# Patient Record
Sex: Male | Born: 1950
Health system: Southern US, Community
[De-identification: ages and names within clinical notes are randomized; demographics above are authoritative.]

## PROBLEM LIST (undated history)

## (undated) DIAGNOSIS — J45909 Unspecified asthma, uncomplicated: Secondary | ICD-10-CM

## (undated) DIAGNOSIS — N200 Calculus of kidney: Secondary | ICD-10-CM

## (undated) DIAGNOSIS — E538 Deficiency of other specified B group vitamins: Secondary | ICD-10-CM

## (undated) DIAGNOSIS — I10 Essential (primary) hypertension: Secondary | ICD-10-CM

## (undated) DIAGNOSIS — M79606 Pain in leg, unspecified: Secondary | ICD-10-CM

## (undated) DIAGNOSIS — M1990 Unspecified osteoarthritis, unspecified site: Secondary | ICD-10-CM

## (undated) DIAGNOSIS — H269 Unspecified cataract: Secondary | ICD-10-CM

## (undated) DIAGNOSIS — K449 Diaphragmatic hernia without obstruction or gangrene: Secondary | ICD-10-CM

## (undated) DIAGNOSIS — Z87442 Personal history of urinary calculi: Secondary | ICD-10-CM

## (undated) DIAGNOSIS — I2510 Atherosclerotic heart disease of native coronary artery without angina pectoris: Secondary | ICD-10-CM

## (undated) DIAGNOSIS — E785 Hyperlipidemia, unspecified: Secondary | ICD-10-CM

## (undated) DIAGNOSIS — J449 Chronic obstructive pulmonary disease, unspecified: Secondary | ICD-10-CM

## (undated) DIAGNOSIS — Z9289 Personal history of other medical treatment: Secondary | ICD-10-CM

## (undated) DIAGNOSIS — G629 Polyneuropathy, unspecified: Secondary | ICD-10-CM

## (undated) DIAGNOSIS — H9190 Unspecified hearing loss, unspecified ear: Secondary | ICD-10-CM

## (undated) DIAGNOSIS — Z720 Tobacco use: Secondary | ICD-10-CM

## (undated) DIAGNOSIS — K219 Gastro-esophageal reflux disease without esophagitis: Secondary | ICD-10-CM

## (undated) HISTORY — DX: Unspecified osteoarthritis, unspecified site: M19.90

## (undated) HISTORY — PX: EYE SURGERY: SHX253

## (undated) HISTORY — DX: Calculus of kidney: N20.0

## (undated) HISTORY — DX: Hyperlipidemia, unspecified: E78.5

## (undated) HISTORY — PX: OTHER SURGICAL HISTORY: SHX169

## (undated) HISTORY — DX: Pain in leg, unspecified: M79.606

## (undated) HISTORY — DX: Chronic obstructive pulmonary disease, unspecified: J44.9

## (undated) HISTORY — DX: Tobacco use: Z72.0

## (undated) HISTORY — PX: CORONARY ARTERY BYPASS GRAFT: SHX141

## (undated) HISTORY — DX: Deficiency of other specified B group vitamins: E53.8

## (undated) HISTORY — DX: Gastro-esophageal reflux disease without esophagitis: K21.9

## (undated) HISTORY — DX: Diaphragmatic hernia without obstruction or gangrene: K44.9

## (undated) HISTORY — DX: Polyneuropathy, unspecified: G62.9

## (undated) HISTORY — DX: Atherosclerotic heart disease of native coronary artery without angina pectoris: I25.10

## (undated) HISTORY — DX: Personal history of other medical treatment: Z92.89

## (undated) HISTORY — DX: Unspecified cataract: H26.9

## (undated) HISTORY — DX: Essential (primary) hypertension: I10

## (undated) HISTORY — PX: COLONOSCOPY: SHX174

---

## 2001-12-26 ENCOUNTER — Encounter: Payer: Self-pay | Admitting: Emergency Medicine

## 2001-12-26 ENCOUNTER — Emergency Department (HOSPITAL_COMMUNITY): Admission: EM | Admit: 2001-12-26 | Discharge: 2001-12-26 | Payer: Self-pay

## 2002-02-15 ENCOUNTER — Encounter: Payer: Self-pay | Admitting: Urology

## 2002-02-15 ENCOUNTER — Encounter: Admission: RE | Admit: 2002-02-15 | Discharge: 2002-02-15 | Payer: Self-pay | Admitting: Urology

## 2002-08-29 ENCOUNTER — Encounter: Payer: Self-pay | Admitting: Emergency Medicine

## 2002-08-29 ENCOUNTER — Observation Stay (HOSPITAL_COMMUNITY): Admission: RE | Admit: 2002-08-29 | Discharge: 2002-08-30 | Payer: Self-pay | Admitting: Internal Medicine

## 2002-11-18 ENCOUNTER — Emergency Department (HOSPITAL_COMMUNITY): Admission: EM | Admit: 2002-11-18 | Discharge: 2002-11-18 | Payer: Self-pay | Admitting: Emergency Medicine

## 2002-11-18 ENCOUNTER — Encounter: Payer: Self-pay | Admitting: Emergency Medicine

## 2003-02-07 ENCOUNTER — Encounter: Payer: Self-pay | Admitting: Internal Medicine

## 2003-02-07 ENCOUNTER — Encounter: Admission: RE | Admit: 2003-02-07 | Discharge: 2003-02-07 | Payer: Self-pay | Admitting: Internal Medicine

## 2003-02-20 ENCOUNTER — Encounter: Admission: RE | Admit: 2003-02-20 | Discharge: 2003-02-20 | Payer: Self-pay | Admitting: Internal Medicine

## 2003-02-20 ENCOUNTER — Encounter: Payer: Self-pay | Admitting: Internal Medicine

## 2003-02-21 ENCOUNTER — Encounter: Payer: Self-pay | Admitting: Internal Medicine

## 2003-02-21 ENCOUNTER — Encounter: Admission: RE | Admit: 2003-02-21 | Discharge: 2003-02-21 | Payer: Self-pay | Admitting: Internal Medicine

## 2003-06-07 ENCOUNTER — Ambulatory Visit (HOSPITAL_COMMUNITY): Admission: RE | Admit: 2003-06-07 | Discharge: 2003-06-07 | Payer: Self-pay | Admitting: Neurosurgery

## 2003-06-19 ENCOUNTER — Ambulatory Visit (HOSPITAL_COMMUNITY): Admission: RE | Admit: 2003-06-19 | Discharge: 2003-06-19 | Payer: Self-pay | Admitting: Neurosurgery

## 2003-08-06 ENCOUNTER — Encounter: Admission: RE | Admit: 2003-08-06 | Discharge: 2003-08-06 | Payer: Self-pay | Admitting: Neurosurgery

## 2003-08-22 ENCOUNTER — Encounter: Admission: RE | Admit: 2003-08-22 | Discharge: 2003-08-22 | Payer: Self-pay | Admitting: Neurosurgery

## 2004-09-16 ENCOUNTER — Ambulatory Visit: Payer: Self-pay | Admitting: Internal Medicine

## 2004-09-16 ENCOUNTER — Ambulatory Visit (HOSPITAL_COMMUNITY): Admission: RE | Admit: 2004-09-16 | Discharge: 2004-09-16 | Payer: Self-pay | Admitting: Internal Medicine

## 2004-10-03 ENCOUNTER — Encounter: Admission: RE | Admit: 2004-10-03 | Discharge: 2004-10-03 | Payer: Self-pay | Admitting: Neurology

## 2008-01-22 ENCOUNTER — Emergency Department (HOSPITAL_COMMUNITY): Admission: EM | Admit: 2008-01-22 | Discharge: 2008-01-22 | Payer: Self-pay | Admitting: Emergency Medicine

## 2008-10-01 ENCOUNTER — Ambulatory Visit: Payer: Self-pay | Admitting: Internal Medicine

## 2008-10-01 DIAGNOSIS — R1012 Left upper quadrant pain: Secondary | ICD-10-CM | POA: Insufficient documentation

## 2008-10-02 ENCOUNTER — Ambulatory Visit: Payer: Self-pay | Admitting: Internal Medicine

## 2008-10-03 ENCOUNTER — Encounter (INDEPENDENT_AMBULATORY_CARE_PROVIDER_SITE_OTHER): Payer: Self-pay | Admitting: *Deleted

## 2008-10-03 LAB — CONVERTED CEMR LAB
ALT: 37 units/L (ref 0–53)
AST: 25 units/L (ref 0–37)
Albumin: 3.8 g/dL (ref 3.5–5.2)
Alkaline Phosphatase: 61 units/L (ref 39–117)
Amylase: 67 units/L (ref 27–131)
Bilirubin, Direct: 0 mg/dL (ref 0.0–0.3)
HCT: 48.5 % (ref 39.0–52.0)
Hemoglobin: 17.2 g/dL — ABNORMAL HIGH (ref 13.0–17.0)
Lipase: 20 units/L (ref 11.0–59.0)
MCHC: 35.5 g/dL (ref 30.0–36.0)
MCV: 89.4 fL (ref 78.0–100.0)
Platelets: 249 10*3/uL (ref 150.0–400.0)
RBC: 5.42 M/uL (ref 4.22–5.81)
RDW: 11.9 % (ref 11.5–14.6)
Total Bilirubin: 1 mg/dL (ref 0.3–1.2)
Total Protein: 6.7 g/dL (ref 6.0–8.3)
WBC: 7.2 10*3/uL (ref 4.5–10.5)

## 2008-10-04 ENCOUNTER — Encounter (INDEPENDENT_AMBULATORY_CARE_PROVIDER_SITE_OTHER): Payer: Self-pay | Admitting: *Deleted

## 2008-10-10 ENCOUNTER — Telehealth: Payer: Self-pay | Admitting: Internal Medicine

## 2008-10-11 ENCOUNTER — Telehealth: Payer: Self-pay | Admitting: Gastroenterology

## 2008-10-12 ENCOUNTER — Ambulatory Visit: Payer: Self-pay | Admitting: Gastroenterology

## 2008-10-12 DIAGNOSIS — R1084 Generalized abdominal pain: Secondary | ICD-10-CM | POA: Insufficient documentation

## 2008-10-12 DIAGNOSIS — R198 Other specified symptoms and signs involving the digestive system and abdomen: Secondary | ICD-10-CM | POA: Insufficient documentation

## 2008-10-12 DIAGNOSIS — R6881 Early satiety: Secondary | ICD-10-CM | POA: Insufficient documentation

## 2008-10-12 DIAGNOSIS — F411 Generalized anxiety disorder: Secondary | ICD-10-CM | POA: Insufficient documentation

## 2008-10-22 ENCOUNTER — Encounter: Payer: Self-pay | Admitting: Gastroenterology

## 2008-10-22 ENCOUNTER — Ambulatory Visit: Payer: Self-pay | Admitting: Gastroenterology

## 2008-10-22 DIAGNOSIS — K2970 Gastritis, unspecified, without bleeding: Secondary | ICD-10-CM | POA: Insufficient documentation

## 2008-10-22 DIAGNOSIS — K2990 Gastroduodenitis, unspecified, without bleeding: Secondary | ICD-10-CM

## 2008-10-23 ENCOUNTER — Telehealth: Payer: Self-pay | Admitting: Gastroenterology

## 2008-10-23 LAB — CONVERTED CEMR LAB: UREASE: POSITIVE

## 2008-10-24 ENCOUNTER — Telehealth: Payer: Self-pay | Admitting: Gastroenterology

## 2008-10-29 ENCOUNTER — Encounter: Payer: Self-pay | Admitting: Gastroenterology

## 2008-11-02 ENCOUNTER — Ambulatory Visit (HOSPITAL_COMMUNITY): Admission: RE | Admit: 2008-11-02 | Discharge: 2008-11-02 | Payer: Self-pay | Admitting: Gastroenterology

## 2008-11-05 DIAGNOSIS — Z860100 Personal history of colon polyps, unspecified: Secondary | ICD-10-CM | POA: Insufficient documentation

## 2008-11-05 DIAGNOSIS — K2270 Barrett's esophagus without dysplasia: Secondary | ICD-10-CM | POA: Insufficient documentation

## 2008-11-05 DIAGNOSIS — K5730 Diverticulosis of large intestine without perforation or abscess without bleeding: Secondary | ICD-10-CM | POA: Insufficient documentation

## 2008-11-05 DIAGNOSIS — K449 Diaphragmatic hernia without obstruction or gangrene: Secondary | ICD-10-CM | POA: Insufficient documentation

## 2008-11-05 DIAGNOSIS — A048 Other specified bacterial intestinal infections: Secondary | ICD-10-CM | POA: Insufficient documentation

## 2008-11-06 ENCOUNTER — Ambulatory Visit: Payer: Self-pay | Admitting: Gastroenterology

## 2010-08-09 ENCOUNTER — Encounter: Payer: Self-pay | Admitting: Neurosurgery

## 2011-01-29 ENCOUNTER — Emergency Department (HOSPITAL_COMMUNITY): Payer: Medicare Other

## 2011-01-29 ENCOUNTER — Inpatient Hospital Stay (HOSPITAL_COMMUNITY): Payer: Medicare Other

## 2011-01-29 ENCOUNTER — Inpatient Hospital Stay (HOSPITAL_COMMUNITY)
Admission: EM | Admit: 2011-01-29 | Discharge: 2011-02-03 | DRG: 234 | Disposition: A | Discharge status: Home / Self Care | Payer: Medicare Other | Attending: Surgery | Admitting: Surgery

## 2011-01-29 DIAGNOSIS — Z91013 Allergy to seafood: Secondary | ICD-10-CM

## 2011-01-29 DIAGNOSIS — I2510 Atherosclerotic heart disease of native coronary artery without angina pectoris: Principal | ICD-10-CM | POA: Diagnosis present

## 2011-01-29 DIAGNOSIS — Z7982 Long term (current) use of aspirin: Secondary | ICD-10-CM

## 2011-01-29 DIAGNOSIS — J449 Chronic obstructive pulmonary disease, unspecified: Secondary | ICD-10-CM | POA: Diagnosis present

## 2011-01-29 DIAGNOSIS — Z01810 Encounter for preprocedural cardiovascular examination: Secondary | ICD-10-CM

## 2011-01-29 DIAGNOSIS — J4489 Other specified chronic obstructive pulmonary disease: Secondary | ICD-10-CM | POA: Diagnosis present

## 2011-01-29 DIAGNOSIS — I10 Essential (primary) hypertension: Secondary | ICD-10-CM | POA: Diagnosis present

## 2011-01-29 DIAGNOSIS — I200 Unstable angina: Secondary | ICD-10-CM | POA: Diagnosis present

## 2011-01-29 DIAGNOSIS — D696 Thrombocytopenia, unspecified: Secondary | ICD-10-CM | POA: Diagnosis not present

## 2011-01-29 DIAGNOSIS — R079 Chest pain, unspecified: Secondary | ICD-10-CM

## 2011-01-29 DIAGNOSIS — D45 Polycythemia vera: Secondary | ICD-10-CM | POA: Diagnosis present

## 2011-01-29 DIAGNOSIS — F17200 Nicotine dependence, unspecified, uncomplicated: Secondary | ICD-10-CM | POA: Diagnosis present

## 2011-01-29 DIAGNOSIS — F1010 Alcohol abuse, uncomplicated: Secondary | ICD-10-CM | POA: Diagnosis present

## 2011-01-29 DIAGNOSIS — D62 Acute posthemorrhagic anemia: Secondary | ICD-10-CM | POA: Diagnosis not present

## 2011-01-29 DIAGNOSIS — K219 Gastro-esophageal reflux disease without esophagitis: Secondary | ICD-10-CM | POA: Diagnosis present

## 2011-01-29 LAB — CBC
HCT: 47.2 % (ref 39.0–52.0)
Hemoglobin: 17.3 g/dL — ABNORMAL HIGH (ref 13.0–17.0)
MCH: 32.6 pg (ref 26.0–34.0)
MCHC: 36.7 g/dL — ABNORMAL HIGH (ref 30.0–36.0)
MCV: 89.1 fL (ref 78.0–100.0)
Platelets: 217 10*3/uL (ref 150–400)
RBC: 5.3 MIL/uL (ref 4.22–5.81)
RDW: 13.2 % (ref 11.5–15.5)
WBC: 7.9 10*3/uL (ref 4.0–10.5)

## 2011-01-29 LAB — POCT I-STAT, CHEM 8
BUN: 7 mg/dL (ref 6–23)
Calcium, Ion: 1.17 mmol/L (ref 1.12–1.32)
Chloride: 105 mEq/L (ref 96–112)
Creatinine, Ser: 0.7 mg/dL (ref 0.50–1.35)
Glucose, Bld: 77 mg/dL (ref 70–99)
HCT: 53 % — ABNORMAL HIGH (ref 39.0–52.0)
Hemoglobin: 18 g/dL — ABNORMAL HIGH (ref 13.0–17.0)
Potassium: 3.8 mEq/L (ref 3.5–5.1)
Sodium: 140 mEq/L (ref 135–145)
TCO2: 24 mmol/L (ref 0–100)

## 2011-01-29 LAB — COMPREHENSIVE METABOLIC PANEL
ALT: 21 U/L (ref 0–53)
Alkaline Phosphatase: 65 U/L (ref 39–117)
BUN: 9 mg/dL (ref 6–23)
CO2: 27 mEq/L (ref 19–32)
Chloride: 101 mEq/L (ref 96–112)
GFR calc Af Amer: >60 mL/min (ref >60)
Glucose, Bld: 76 mg/dL (ref 70–99)
Potassium: 4.3 mEq/L (ref 3.5–5.1)
Total Bilirubin: 0.5 mg/dL (ref 0.3–1.2)

## 2011-01-29 LAB — DIFFERENTIAL
Basophils Absolute: 0 10*3/uL (ref 0.0–0.1)
Basophils Relative: 0 % (ref 0–1)
Eosinophils Absolute: 0.1 10*3/uL (ref 0.0–0.7)
Eosinophils Relative: 1 % (ref 0–5)
Lymphocytes Relative: 20 % (ref 12–46)
Lymphs Abs: 1.6 10*3/uL (ref 0.7–4.0)
Monocytes Absolute: 0.6 10*3/uL (ref 0.1–1.0)
Monocytes Relative: 7 % (ref 3–12)
Neutro Abs: 5.6 10*3/uL (ref 1.7–7.7)
Neutrophils Relative %: 71 % (ref 43–77)

## 2011-01-29 LAB — HEMOGLOBIN A1C: Hgb A1c MFr Bld: 5.2 % (ref <5.7)

## 2011-01-29 LAB — MRSA PCR SCREENING: MRSA by PCR: NEGATIVE

## 2011-01-29 LAB — CK TOTAL AND CKMB (NOT AT ARMC)
CK, MB: 1.3 ng/mL (ref 0.3–4.0)
Total CK: 47 U/L (ref 7–232)

## 2011-01-29 LAB — CARDIAC PANEL(CRET KIN+CKTOT+MB+TROPI)
CK, MB: 2.2 ng/mL (ref 0.3–4.0)
Relative Index: INVALID (ref 0.0–2.5)
Total CK: 43 U/L (ref 7–232)

## 2011-01-29 LAB — MAGNESIUM: Magnesium: 2.1 mg/dL (ref 1.5–2.5)

## 2011-01-29 LAB — PROTIME-INR: Prothrombin Time: 13.3 seconds (ref 11.6–15.2)

## 2011-01-29 NOTE — H&P (Signed)
NAMEESMERALDA, BLANFORD NO.:  [id number]  MEDICAL RECORD NO.:  [id number]  LOCATION:  [facility]                         FACILITY:  [facility]  PHYSICIAN:  Valetta Close, M.D.   DATE OF BIRTH:  04-Apr-1951  DATE OF ADMISSION:  01/29/2011 DATE OF DISCHARGE:                             HISTORY & PHYSICAL   CHIEF COMPLAINT:  Exertional chest pain.  HISTORY OF PRESENT ILLNESS:  This is a 60 year old male with an 80-pack- year history of smoking and 6-beers-a-day history of drinking, both still active.  For the last 3-4 weeks, has been noticing left-sided chest pain and left arm tingling verses numbness worse with exertion, improved with rest.  At first, the pain was dull 2/10.  It was also worse with cough, but especially worse with exertion.  He has not had shortness of breath during this and his cough is stable and again his symptoms are better with rest.  He notes that it has been gradually worsening and over the last few weeks occasionally present at rest, but then again if he takes some deep breath and "coughs himself down and relaxes, it goes away."  However, this morning while walking outside with his daughter, he developed severe left-sided chest pain 10/10.  No radiation with associated left arm tingling, nausea, dizziness, and lightheadedness.  There was no sweating.  The symptoms have gradually improved.  He has received aspirin here in the ER, but the symptoms have not gone away.  They are still present.  Currently, it is about 4/10 and again did not radiate and it has remained substernal.  The pain is not reproduced with palpation by the patient or with movement of his arms.  PAST MEDICAL HISTORY:  Reflux, does not feel like it is typical reflux. He drinks 6 beers a day.  He has an 80-pack-year history.  In the past, he was told he had hypertension, but that went away.  His last check was by Dr. Clearance Coots 2 years ago and he was told that everything was  great.  ALLERGIES:  He has an allergy to IV DYE and CONTRAST.  He has received contrast in the past and it gave him hives.  He is a full code.  SOCIAL HISTORY:  Again, 80-pack-year history and 6 beers a day.  He does not feel he needs an eye opener.  He has no desire to quit.  People have not asked him to quit and he has not felt to need to cut back.  He has never gone without drinking that he can remember and he has also never gone into withdrawal.  HOME MEDICATION:  She takes Prilosec once a day.  REVIEW OF SYSTEMS:  A 10-point review of systems was performed and otherwise negative.  FAMILY HISTORY:  Mother deceased in her 55s, she had dementia and cancer.  Father deceased at 40 of heart attack.  He has many siblings, they are all fine and his baseline cough is unchanged.  PHYSICAL EXAMINATION:  VITAL SIGNS:  Temperature 97.5, heart rate 84, blood pressure 136/96, and O2 sat 99% on room air. GENERAL:  He appears his stated age and he is a bit anxious, but in no  apparent distress.  His eyes are anicteric. LUNGS:  Coarse bilaterally, but he is able to speak in full times without difficulty and there is no wheezing. CARDIAC:  Regular rate and rhythm with no murmur.  There is no point tenderness and the pain is not reproducible with movement of his arms or again palpation of his chest. GASTROINTESTINAL:  Bowel sounds positive.  No tenderness, rebound, or guarding.  Question mild hepatomegaly. EXTREMITIES:  No edema. NEUROLOGIC:  Alert and oriented x3.  Cranial nerves II through XII intact. PSYCHIATRIC:  He is pleasant, but may be a bit anxious.  LABORATORY DATA:  Labs, white count 7.9, hemoglobin 17, hematocrit 47, and platelets 217.  Sodium 140, potassium 3.8, chloride 105, bicarb 24, BUN 7, creatinine 0.7, and glucose 77.  Troponins are negative x1. Cardiac enzymes are negative x1.  A chest x-ray is negative.  He has received aspirin in the emergency room.  ASSESSMENT AND  PLAN: 1. Unstable angina.  I have discussed the case with Dr. Gala Romney at     [facility] and I will put him on a heparin drip.  He has     already received aspirin.  I will put him on a beta-blocker, since     he has blood pressure and always looks like they were tolerated.  I     will start him on a statin and give him p.r.n. nitro and morphine     and continue him on oxygen.  Possible cardiac cath today.  I will     make him n.p.o. and again Cardiology will be seen.  I will cycle     cardiac enzymes and an EKG. 2. Tobacco abuse.  I will start on NicoDerm packs tomorrow and I have     counseled him on the need to quit. 3. Alcohol abuse.  His K score is zero, but this is still a problem, 6     beers a day.  I am going to get a comprehensive metabolic panel.  I     am going to put him on the CIWA protocol with the scheduled doses     of Ativan and p.r.n. doses as well.  Given him the thiamine, folic     acid, and multivitamin.  Hopefully, he does not go into withdrawal     and hopefully, he is not in hospital too long based on what needs     to be done from a cardiac standpoint.  He has expressed no desire     to quit. 4. Polycythemia.  I think, this is secondary to his tobacco abuse.  We     will check a B12, RBC, folate, TSH, and ferritin.  MEDICATIONS:  Prilosec once a day.  Approximate length of this admission was approximately 40 minutes.     Valetta Close, M.D.     JC/MEDQ  D:  01/29/2011  T:  01/29/2011  Job:  [id number]  cc:   Titus Dubin. Alwyn Ren, MD,FACP,FCCP  Electronically Signed by Valetta Close M.D. on 01/29/2011 04:55:31 PM

## 2011-01-30 ENCOUNTER — Inpatient Hospital Stay (HOSPITAL_COMMUNITY): Payer: Medicare Other

## 2011-01-30 DIAGNOSIS — I2510 Atherosclerotic heart disease of native coronary artery without angina pectoris: Secondary | ICD-10-CM

## 2011-01-30 LAB — POCT I-STAT 3, VENOUS BLOOD GAS (G3P V)
Patient temperature: 32
TCO2: 24 mmol/L (ref 0–100)
pH, Ven: 7.463 — ABNORMAL HIGH (ref 7.250–7.300)

## 2011-01-30 LAB — POCT I-STAT 3, ART BLOOD GAS (G3+)
Acid-base deficit: 5 mmol/L — ABNORMAL HIGH (ref 0.0–2.0)
Bicarbonate: 21.7 mEq/L (ref 20.0–24.0)
Bicarbonate: 22.3 mEq/L (ref 20.0–24.0)
Bicarbonate: 20.4 mEq/L (ref 20.0–24.0)
O2 Saturation: 98 %
Patient temperature: 97.3
Patient temperature: 32
Patient temperature: 34.6
TCO2: 23 mmol/L (ref 0–100)
pCO2 arterial: 29.4 mmHg — ABNORMAL LOW (ref 35.0–45.0)
pH, Arterial: 7.474 — ABNORMAL HIGH (ref 7.350–7.450)
pH, Arterial: 7.528 — ABNORMAL HIGH (ref 7.350–7.450)
pH, Arterial: 7.367 (ref 7.350–7.450)
pH, Arterial: 7.349 — ABNORMAL LOW (ref 7.350–7.450)
pO2, Arterial: 254 mmHg — ABNORMAL HIGH (ref 80.0–100.0)
pO2, Arterial: 78 mmHg — ABNORMAL LOW (ref 80.0–100.0)
pO2, Arterial: 101 mmHg — ABNORMAL HIGH (ref 80.0–100.0)

## 2011-01-30 LAB — URINALYSIS, ROUTINE W REFLEX MICROSCOPIC
Leukocytes, UA: NEGATIVE
Nitrite: NEGATIVE
Specific Gravity, Urine: 1.022 (ref 1.005–1.030)
Urobilinogen, UA: 0.2 mg/dL (ref 0.0–1.0)
pH: 6.5 (ref 5.0–8.0)

## 2011-01-30 LAB — CBC
HCT: 27.6 % — ABNORMAL LOW (ref 39.0–52.0)
MCHC: 35.1 g/dL (ref 30.0–36.0)
MCV: 89.5 fL (ref 78.0–100.0)
MCV: 89 fL (ref 78.0–100.0)
Platelets: 193 10*3/uL (ref 150–400)
RBC: 4.74 MIL/uL (ref 4.22–5.81)
RDW: 13.1 % (ref 11.5–15.5)
RDW: 13.1 % (ref 11.5–15.5)
WBC: 8.2 10*3/uL (ref 4.0–10.5)

## 2011-01-30 LAB — ABO/RH: ABO/RH(D): A POS

## 2011-01-30 LAB — POCT I-STAT 4, (NA,K, GLUC, HGB,HCT)
Glucose, Bld: 89 mg/dL (ref 70–99)
Glucose, Bld: 79 mg/dL (ref 70–99)
Glucose, Bld: 100 mg/dL — ABNORMAL HIGH (ref 70–99)
HCT: 44 % (ref 39.0–52.0)
HCT: 42 % (ref 39.0–52.0)
HCT: 33 % — ABNORMAL LOW (ref 39.0–52.0)
Hemoglobin: 14.3 g/dL (ref 13.0–17.0)
Hemoglobin: 11.2 g/dL — ABNORMAL LOW (ref 13.0–17.0)
Hemoglobin: 12.6 g/dL — ABNORMAL LOW (ref 13.0–17.0)
Potassium: 4.2 mEq/L (ref 3.5–5.1)
Potassium: 5 mEq/L (ref 3.5–5.1)
Potassium: 4.8 mEq/L (ref 3.5–5.1)
Potassium: 3.4 mEq/L — ABNORMAL LOW (ref 3.5–5.1)
Sodium: 136 mEq/L (ref 135–145)
Sodium: 135 mEq/L (ref 135–145)
Sodium: 140 mEq/L (ref 135–145)

## 2011-01-30 LAB — APTT: aPTT: 38 seconds — ABNORMAL HIGH (ref 24–37)

## 2011-01-30 LAB — BASIC METABOLIC PANEL
CO2: 24 mEq/L (ref 19–32)
Chloride: 100 mEq/L (ref 96–112)
Creatinine, Ser: 0.62 mg/dL (ref 0.50–1.35)
GFR calc Af Amer: >60 mL/min (ref >60)
Potassium: 3.9 mEq/L (ref 3.5–5.1)

## 2011-01-30 LAB — HEPARIN LEVEL (UNFRACTIONATED): Heparin Unfractionated: <0.1 IU/mL — ABNORMAL LOW (ref 0.30–0.70)

## 2011-01-30 LAB — HEMOGLOBIN AND HEMATOCRIT, BLOOD: Hemoglobin: 11.8 g/dL — ABNORMAL LOW (ref 13.0–17.0)

## 2011-01-30 LAB — TSH: TSH: 2.629 u[IU]/mL (ref 0.350–4.500)

## 2011-01-30 LAB — TYPE AND SCREEN

## 2011-01-30 LAB — FERRITIN: Ferritin: 314 ng/mL (ref 22–322)

## 2011-01-31 ENCOUNTER — Inpatient Hospital Stay (HOSPITAL_COMMUNITY): Payer: Medicare Other

## 2011-01-31 LAB — CBC
HCT: 32.3 % — ABNORMAL LOW (ref 39.0–52.0)
MCH: 31.8 pg (ref 26.0–34.0)
MCV: 89.4 fL (ref 78.0–100.0)
MCV: 90.2 fL (ref 78.0–100.0)
Platelets: 142 10*3/uL — ABNORMAL LOW (ref 150–400)
Platelets: 129 10*3/uL — ABNORMAL LOW (ref 150–400)
RBC: 4.15 MIL/uL — ABNORMAL LOW (ref 4.22–5.81)
RBC: 3.58 MIL/uL — ABNORMAL LOW (ref 4.22–5.81)
WBC: 11.4 10*3/uL — ABNORMAL HIGH (ref 4.0–10.5)
WBC: 10 10*3/uL (ref 4.0–10.5)

## 2011-01-31 LAB — BASIC METABOLIC PANEL
CO2: 25 mEq/L (ref 19–32)
Chloride: 106 mEq/L (ref 96–112)
Creatinine, Ser: 0.6 mg/dL (ref 0.50–1.35)
Potassium: 4 mEq/L (ref 3.5–5.1)

## 2011-01-31 LAB — GLUCOSE, CAPILLARY
Glucose-Capillary: 104 mg/dL — ABNORMAL HIGH (ref 70–99)
Glucose-Capillary: 114 mg/dL — ABNORMAL HIGH (ref 70–99)
Glucose-Capillary: 123 mg/dL — ABNORMAL HIGH (ref 70–99)
Glucose-Capillary: 125 mg/dL — ABNORMAL HIGH (ref 70–99)

## 2011-01-31 LAB — POCT I-STAT, CHEM 8
BUN: 11 mg/dL (ref 6–23)
Chloride: 97 mEq/L (ref 96–112)
Creatinine, Ser: 0.8 mg/dL (ref 0.50–1.35)
Sodium: 135 mEq/L (ref 135–145)
TCO2: 25 mmol/L (ref 0–100)

## 2011-01-31 LAB — MAGNESIUM
Magnesium: 2.7 mg/dL — ABNORMAL HIGH (ref 1.5–2.5)
Magnesium: 2 mg/dL (ref 1.5–2.5)

## 2011-01-31 LAB — CREATININE, SERUM: GFR calc Af Amer: >60 mL/min (ref >60)

## 2011-02-01 ENCOUNTER — Inpatient Hospital Stay (HOSPITAL_COMMUNITY): Payer: Medicare Other

## 2011-02-01 LAB — DIFFERENTIAL
Basophils Absolute: 0 10*3/uL (ref 0.0–0.1)
Basophils Relative: 0 % (ref 0–1)
Eosinophils Absolute: 0.1 10*3/uL (ref 0.0–0.7)
Eosinophils Relative: 1 % (ref 0–5)
Neutrophils Relative %: 78 % — ABNORMAL HIGH (ref 43–77)

## 2011-02-01 LAB — CBC
MCV: 90.9 fL (ref 78.0–100.0)
Platelets: 121 10*3/uL — ABNORMAL LOW (ref 150–400)
RBC: 3.61 MIL/uL — ABNORMAL LOW (ref 4.22–5.81)
RDW: 13.3 % (ref 11.5–15.5)
WBC: 9.5 10*3/uL (ref 4.0–10.5)

## 2011-02-01 LAB — BASIC METABOLIC PANEL
CO2: 28 mEq/L (ref 19–32)
Chloride: 102 mEq/L (ref 96–112)
Creatinine, Ser: 0.67 mg/dL (ref 0.50–1.35)
GFR calc Af Amer: >60 mL/min (ref >60)
Sodium: 135 mEq/L (ref 135–145)

## 2011-02-01 LAB — GLUCOSE, CAPILLARY
Glucose-Capillary: 102 mg/dL — ABNORMAL HIGH (ref 70–99)
Glucose-Capillary: 105 mg/dL — ABNORMAL HIGH (ref 70–99)
Glucose-Capillary: 125 mg/dL — ABNORMAL HIGH (ref 70–99)

## 2011-02-02 ENCOUNTER — Inpatient Hospital Stay (HOSPITAL_COMMUNITY): Payer: Medicare Other

## 2011-02-02 LAB — BASIC METABOLIC PANEL
BUN: 9 mg/dL (ref 6–23)
CO2: 28 mEq/L (ref 19–32)
Calcium: 8.1 mg/dL — ABNORMAL LOW (ref 8.4–10.5)
Chloride: 104 mEq/L (ref 96–112)
Creatinine, Ser: 0.7 mg/dL (ref 0.50–1.35)

## 2011-02-02 LAB — FOLATE RBC: RBC Folate: 395 ng/mL (ref >=366)

## 2011-02-02 LAB — CBC
HCT: 32.4 % — ABNORMAL LOW (ref 39.0–52.0)
MCH: 31.8 pg (ref 26.0–34.0)
MCV: 90.3 fL (ref 78.0–100.0)
RBC: 3.59 MIL/uL — ABNORMAL LOW (ref 4.22–5.81)
RDW: 13.1 % (ref 11.5–15.5)
WBC: 8.4 10*3/uL (ref 4.0–10.5)

## 2011-02-04 LAB — VITAMIN B12 BINDING CAPACITY, BLOOD: Vitamin B12 Bind Capacity: 1293 pg/mL (ref 650–1340)

## 2011-02-05 ENCOUNTER — Telehealth: Payer: Self-pay | Admitting: Cardiology

## 2011-02-05 NOTE — Cardiovascular Report (Signed)
NAMEJOMAR, Derrick Stokes NO.:  [id number]  MEDICAL RECORD NO.:  [id number]  LOCATION:  2905                         FACILITY:  [facility]  PHYSICIAN:  Arturo Morton. Riley Kill, MD, FACCDATE OF BIRTH:  Jun 29, 1951  DATE OF PROCEDURE:  01/29/2011 DATE OF DISCHARGE:                           CARDIAC CATHETERIZATION   INDICATIONS:  [name] is a 60 year old who has had progressive chest pain.  He came in emergently.  He was brought to the catheterization laboratory from the emergency room for some ongoing chest discomfort. Risks, benefits and alternatives were discussed with the patient and he consented to proceed.  His EKG was unremarkable.  First set of cardiac enzymes were normal.  His symptoms were worrisome for ischemia.  PROCEDURE: 1. Left heart catheterization. 2. Selective coronary arteriography. 3. Selective left ventriculography. 4. Femoral closure.  DESCRIPTION OF PROCEDURE:  The procedure was performed from the right femoral artery using 5-French catheters.  He tolerated the procedure well without complication.  After reviewing the films, we elected to close his right femoral artery.  The femoral artery was closed using an Angio-Seal device after reprepping the groin and changing gloves.  There was excellent hemostasis.  I reviewed the films with his family.  A surgical consultation was called.  There were no major complications.  HEMODYNAMIC DATA: 1. Central aortic pressure 97/74, mean 85. 2. LV pressure 106/10. 3. No gradient or pullback across the aortic valve.  ANGIOGRAPHIC DATA: 1. The left main is free of disease. 2. The LAD has about a 50% area of the segmental plaquing proximally,     followed by a high-grade bifurcational stenosis with about 90-95%     involving the LAD.  It does involve the side branches involved with     about 70% narrowing at the ostium.  More distally in the LAD is an     eccentric 60% just after the second major diagonal. 3.  There is a moderate size ramus intermedius vessel with some 40% mid     plaquing. 4. The AV circumflex provides a marginal branch that is fairly small     with about 90% ostial narrowing.  This is followed by the tandem     lesions of 75% in the midportion of the vessel leading into the     second obtuse marginal branch.  This subbranch has about 50%     narrowing.  The obtuse marginal is suitable for grafting. 5. The right coronary artery is calcified with a calcified segmental     plaque of about 70% in the midvessel and then a 70% posterolateral     stenosis after the takeoff of the PDA. 6. Ventriculography in the RAO projection reveals mild global     hypokinesis with ejection fraction estimated at 45-50%.  CONCLUSIONS: 1. Mild reduction in global systolic function. 2. Critical disease of the left anterior descending artery. 3. Moderately high-grade disease involving the circumflex and right     coronary vessels.  RECOMMENDATIONS:  Given the disease burden, the patient would likely be benefited by revascularization surgery.  He has mild reduction in overall LV function with a high-grade bifurcational calcified stenosis involving the LAD.  Surgical consultation will  be obtained.     Arturo Morton. Riley Kill, MD, [facility]     TDS/MEDQ  D:  01/29/2011  T:  01/30/2011  Job:  [id number]  cc:   Bevelyn Buckles. Bensimhon, MD Dr. Alwyn Ren CV Laboratory  Electronically Signed by Shawnie Pons MD [facility] on 02/05/2011 07:26:41 AM

## 2011-02-05 NOTE — Telephone Encounter (Signed)
While  in the hospital patient was seen by Dr. Gala Romney, Patient's wife is Dr. Vern Claude patient,she would like for Dr. Daleen Squibb to be pt's MD also. An appointment was made for pt. With Dr. Daleen Squibb August 17 th at 2:30 PM. Pt. And wife aware.

## 2011-02-05 NOTE — Telephone Encounter (Signed)
Pt seen in hospital for quintuple bypass, saw dr bensimhon, pt's wife debbie see's dr wall and would like pt to see him too, will he take on this pt?

## 2011-02-23 ENCOUNTER — Other Ambulatory Visit: Payer: Self-pay | Admitting: Surgery

## 2011-02-23 DIAGNOSIS — I2510 Atherosclerotic heart disease of native coronary artery without angina pectoris: Secondary | ICD-10-CM

## 2011-02-24 ENCOUNTER — Ambulatory Visit
Admission: RE | Admit: 2011-02-24 | Discharge: 2011-02-24 | Disposition: A | Discharge status: Home / Self Care | Payer: Medicare Other | Source: Ambulatory Visit | Attending: Surgery | Admitting: Surgery

## 2011-02-24 ENCOUNTER — Ambulatory Visit (INDEPENDENT_AMBULATORY_CARE_PROVIDER_SITE_OTHER): Payer: Self-pay | Admitting: Surgery

## 2011-02-24 DIAGNOSIS — I2510 Atherosclerotic heart disease of native coronary artery without angina pectoris: Secondary | ICD-10-CM

## 2011-02-24 NOTE — Assessment & Plan Note (Addendum)
OFFICE VISIT  GARNET, OVERFIELD DOB:  [birth year]                                        February 24, 2011 CHART #:  [id number]  The patient returned to my office today for followup, status post coronary artery bypass graft surgery x5 on January 30, 2011.  He has been walking at home without chest pain or shortness of breath.  He has continued to abstain from smoking.  His only complaint is of some numbness along the lower medial portion of the right leg extending out on the top of his foot, probably related to saphenous vein harvest.  On physical examination today; blood pressure 113/79, his pulse is 76 and regular, respiratory rate is 18 and unlabored.  Oxygen saturation on room air is 98%.  He looks well.  Cardiac exam shows regular rate and rhythm with normal heart sounds.  His lung exam is clear.  Chest incision is healing well and the sternum is stable.  His right leg incision is healing well and there is no peripheral edema.  His medications are: 1. Enteric-coated aspirin 325 mg daily. 2. Lopressor 12.5 mg b.i.d. 3. Crestor 10 mg nightly. 4. Prilosec 20 mg daily. 5. Oxycodone p.r.n. for pain. 6. NicoDerm CQ 14 mg every 24 h. 7. Mucinex 600 mg b.i.d.  Followup chest x-ray today shows clear lung fields and no pleural effusions.  IMPRESSION:  Overall, the patient is doing well following coronary artery bypass graft surgery.  I encouraged him to continue abstaining from smoking.  I told him he could to return driving a car, but should refrain from lifting anything heavier than 10 pounds for total of 3 months from date of surgery.  He is going to plan on following up with Dr. Valera Castle and will contact me if develops any problems with his incision.  I told him I thought the numbness in his right lower leg was in the distribution of the saphenous nerve and should improve with time.  Evelene Croon, M.D. Electronically Signed  BB/MEDQ  D:  02/24/2011   T:  02/24/2011  Job:  [id number]

## 2011-02-24 NOTE — Discharge Summary (Addendum)
  NAMEMORDCHE, HEDGLIN NO.:  [id number]  MEDICAL RECORD NO.:  [id number]  LOCATION:  2006                         FACILITY:  [facility]  PHYSICIAN:  Evelene Croon, M.D.     DATE OF BIRTH:  05/27/1951  DATE OF ADMISSION:  01/29/2011 DATE OF DISCHARGE:  02/03/2011                              DISCHARGE SUMMARY   ADDENDUM  Currently on postop day #4, the patient has been tolerating a diet.  He has had multiple bowel movements.  PHYSICAL EXAMINATION:  VITAL SIGNS:  He is afebrile, heart rate is in the 80s, BP 132/90, O2 sat 97% on room air.  Preoperative weight 77 kg, today's weight 80.1 kg. CARDIOVASCULAR:  Regular rhythm. PULMONARY:  Slight decreased at the bases and some fine crackles.  Minor subcutaneous emphysema, left anterior chest wall (as he has had previously). ABDOMEN:  Soft, nontender.  Bowel sounds present. EXTREMITIES:  Mild lower extremity edema.  Wounds clean and dry.  IMPRESSION AND PLAN:  The patient is felt surgically stable for discharge home today.  The only additional medications include Lasix 40 mg p.o. daily x3 days and potassium chloride 20 mEq p.o. daily x3 days.     Doree Fudge, PA   ______________________________ Evelene Croon, M.D.    DZ/MEDQ  D:  02/03/2011  T:  02/03/2011  Job:  [id number]  cc:   Bevelyn Buckles. Bensimhon, MD Arturo Morton. Riley Kill, MD, [facility]  Electronically Signed by Doree Fudge PA on 02/24/2011 09:31:30 AM Electronically Signed by Evelene Croon M.D. on 03/09/2011 03:56:09 PM

## 2011-03-06 ENCOUNTER — Ambulatory Visit (INDEPENDENT_AMBULATORY_CARE_PROVIDER_SITE_OTHER): Payer: Medicare Other | Admitting: Cardiology

## 2011-03-06 ENCOUNTER — Encounter: Payer: Self-pay | Admitting: Cardiology

## 2011-03-06 ENCOUNTER — Encounter: Payer: Medicare Other | Admitting: Cardiology

## 2011-03-06 VITALS — BP 108/74 | HR 71 | Ht 68.0 in | Wt 177.0 lb

## 2011-03-06 DIAGNOSIS — E785 Hyperlipidemia, unspecified: Secondary | ICD-10-CM

## 2011-03-06 DIAGNOSIS — Z951 Presence of aortocoronary bypass graft: Secondary | ICD-10-CM

## 2011-03-06 MED ORDER — ROSUVASTATIN CALCIUM 10 MG PO TABS: 10.0000 mg | ORAL_TABLET | Freq: Every day | ORAL | Status: DC

## 2011-03-06 MED ORDER — METOPROLOL TARTRATE 25 MG PO TABS: 12.5000 mg | ORAL_TABLET | Freq: Two times a day (BID) | ORAL | Status: DC

## 2011-03-06 NOTE — Patient Instructions (Signed)
Your physician recommends that you return for lab work in: 4 weeks for fasting cholesterol level  Your physician discussed the hazards of tobacco use. Tobacco use cessation is recommended and techniques and options to help you quit were discussed.   Your physician recommends that you schedule a follow-up appointment in: 4 months with Dr. Daleen Squibb

## 2011-03-06 NOTE — Assessment & Plan Note (Signed)
Doing well. Continue preventative therapy. The patient admonished not to smoke again. He has been instructed to take his Crestor daily. We'll check labs in about 4 weeks. Cardiac rehabilitation recommended that he would like not to do that. Moderate alcohol reinforced. I will see back in 4 months.

## 2011-03-06 NOTE — Progress Notes (Signed)
HPI Derrick Stokes visit today for further evaluation and management of his recent bypass surgery. He presented to the hospital with 3 weeks' worth of chest discomfort.  Catheterization showed multivessel disease including a 95% proximal LAD. EF was 45%. He underwent bypass surgery with Dr. Lavinia Sharps at which time 5 grafts were placed. He did well postoperatively.  Since discharge he has quit smoking. He followed up with Dr. Lavinia Sharps. Chest x-ray showed no pleural effusions and clear lung fields. Is not history cardiac rehabilitation.  Prior surgery, he drank about a case of beer a week. Encouraged on drink to a day at most. He does have some left ventricular dysfunction.  EKG was not repeated today.  He has not had followup lipids on Crestor. He ran out of it 2 days ago. Past Medical History  Diagnosis Date  . Peripheral neuropathy     From trauma  . B12 deficiency     Borderline  . Arthritis   . Kidney stones   . Hiatal hernia     Past Surgical History  Procedure Date  . Colonoscopy     Neg  . Epidural steroids     Family History  Problem Relation Age of Onset  . Diabetes Father   . Heart attack Father 57    History   Social History  . Marital Status: Married    Spouse Name: N/A    Number of Children: N/A  . Years of Education: N/A   Occupational History  . [employer]    Social History Main Topics  . Smoking status: Former Smoker -- 1.0 packs/day    Quit date: 01/29/2011  . Smokeless tobacco: Former Neurosurgeon    Types: Chew  . Alcohol Use: No     No alcohol abuse since February 2010  . Drug Use: Not on file  . Sexually Active: Not on file   Other Topics Concern  . Not on file   Social History Narrative   MarriedGets regular exercise - walks 1/4 mile per day 5 times a week without symptoms    Allergies  Allergen Reactions  . Iodine   . Iohexol     Current Outpatient Prescriptions  Medication Sig Dispense Refill  . aspirin 325 MG tablet Take 325 mg by mouth  daily.        . metoprolol tartrate (LOPRESSOR) 25 MG tablet Take 12.5 mg by mouth 2 (two) times daily.        [name] omeprazole (PRILOSEC) 40 MG capsule Take 40 mg by mouth daily.        . rosuvastatin (CRESTOR) 10 MG tablet Take 10 mg by mouth daily.          ROS Negative other than HPI.   PE General Appearance: well developed, well nourished in no acute distress HEENT: symmetrical face, PERRLA, good dentition  Neck: no JVD, thyromegaly, or adenopathy, trachea midline Chest: symmetric without deformity Cardiac: PMI non-displaced, RRR, normal S1, S2, no gallop or murmur Lung: clear to ausculation and percussion Vascular: all pulses full without bruits  Abdominal: nondistended, nontender, good bowel sounds, no HSM, no bruits Extremities: no cyanosis, clubbing or edema, no sign of DVT, no varicosities  Skin: normal color, no rashes Neuro: alert and oriented x 3, non-focal Pysch: normal affect in Filed Vitals:   03/06/11 1418  BP: 108/74  Pulse: 71  Height: 5\' 8"  (1.727 m)  Weight: 177 lb (80.287 kg)    EKG  Labs and Studies Reviewed.   Lab Results  Component  Value Date   WBC 8.4 02/02/2011   HGB 11.4* 02/02/2011   HCT 32.4* 02/02/2011   MCV 90.3 02/02/2011   PLT 144* 02/02/2011      Chemistry      Component Value Date/Time   NA 139 02/02/2011 0400   K 3.6 02/02/2011 0400   CL 104 02/02/2011 0400   CO2 28 02/02/2011 0400   BUN 9 02/02/2011 0400   CREATININE 0.70 02/02/2011 0400      Component Value Date/Time   CALCIUM 8.1* 02/02/2011 0400   ALKPHOS 65 01/29/2011 1236   AST 18 01/29/2011 1236   ALT 21 01/29/2011 1236   BILITOT 0.5 01/29/2011 1236       No results found for this basename: CHOL   No results found for this basename: HDL   No results found for this basename: LDLCALC   No results found for this basename: TRIG   No results found for this basename: CHOLHDL   Lab Results  Component Value Date   HGBA1C 5.2 01/29/2011   Lab Results  Component Value Date    ALT 21 01/29/2011   AST 18 01/29/2011   ALKPHOS 65 01/29/2011   BILITOT 0.5 01/29/2011   Lab Results  Component Value Date   TSH 2.629 01/30/2011   him and as he

## 2011-03-09 NOTE — Discharge Summary (Signed)
Derrick Stokes, Derrick Stokes NO.:  [id number]  MEDICAL RECORD NO.:  [id number]  LOCATION:  2006                         FACILITY:  [facility]  PHYSICIAN:  Evelene Croon, M.D.     DATE OF BIRTH:  [birth year]  DATE OF ADMISSION:  01/29/2011 DATE OF DISCHARGE:  02/03/2011                              DISCHARGE SUMMARY   ADMITTING DIAGNOSES: 1. Multivessel coronary artery disease (with an EF of 45-50%). 2. History of hypertension. 3. History of tobacco abuse. 4. History of alcohol use. 5. History of gastroesophageal reflux disease.  DISCHARGE DIAGNOSES: 1. Multivessel coronary artery disease (with an EF of 45-50%). 2. History of hypertension. 3. History of tobacco abuse. 4. History of alcohol use. 5. History of gastroesophageal reflux disease. 6. Thrombocytopenia. 7. Acute blood loss anemia.  PROCEDURES: 1. Cardiac catheterization performed by Dr. Riley Kill on January 29, 2011. 2. Mediastinotomy for CABG x5 (LIMA to LAD, SVG to diagonal, SVG to     obtuse marginal, SVG to the posterior descending coronary artery,     and posterolateral branch of the right coronary artery that     deviates from the right thigh and calf by Dr. Laneta Simmers on January 30, 2011.  HISTORY OF PRESENT ILLNESS:  This is a 60 year old Caucasian male with aforementioned medical history who for the past 3-4 weeks, had been noticing left-sided chest pain, left arm tingling versus numbness.  Both of these symptoms were made worse with exertion but improved with rest. At first, the patient rated his chest pain as dull and a 2/10, however, he most recently developed an increase in intensity of the chest pain to 10/10 and this did occur with exertion.  Associated with this was pain radiation to the left arm, nausea, and dizziness.  The patient denied any shortness of breath or diaphoresis.  His wife then drove him to [facility] for further evaluation and treatment.  He was initially treated  with sublingual nitroglycerin and aspirin.  His first set of cardiac markers and an initial EKG were normal.  He continued to have some chest discomfort and was taken in order to undergo cardiac catheterization on January 29, 2011, with Dr. Riley Kill.  Results indicated a 50% proximal LAD stenosis followed by 90-95% proximal and mid stenosis at the takeoff of first diagonal branch, the first diagonal branch itself had a 70% ostial stenosis, the mid left circumflex had a 60% stenosis, the first marginal was small and had a 90% proximal stenosis. There was a 75% stenosis sequentially for a large marginal branch of the left circumflex.  The right coronary artery with a dominant vessel and had a 70% midvessel stenosis as well as 70% stenosis just at the takeoff of a large posterolateral branch and the EF was estimated to be 45-50%. The patient then did have some mild chest discomfort postcardiac catheterization.  A Cardiothoracic consultation was obtained with Dr. Laneta Simmers with a consideration of coronary bypass grafting surgery.  At the time Dr. Laneta Simmers saw the patient, he was no longer having chest pain and he was on nitroglycerin.  Potential risks and complications and benefits of the surgery were discussed with the patient and  he agreed to proceed with surgery.  However, prior to undergoing surgery, Duplex carotid ultrasound revealed no evidence of internal carotid artery stenosis. The patient agreed to the surgery and underwent a CABG x5 on January 30, 2011.  BRIEF HOSPITAL COURSE STAY:  The patient was extubated later the evening of surgery without difficulty.  He remained afebrile and his vital signs were stable.  Swan-Ganz, A-line, chest tubes, and Foley were all removed early in his postoperative course.  He was started on a low-dose beta- blocker.  He was felt surgically stable for transfer from the intensive care unit to [facility] for further convalescence on February 01, 2011.  Currently on postop  day #3, the patient has complaints of a cough and a sore throat.  He was passing flatus, but has not had a bowel movement yet. He was tolerating a diet.  He was afebrile, his heart rate is 80, his BP 105/73, and O2 95% on room air.  His preoperative weight was 77 kg, today's weight is unknown and will be obtained in the a.m.  PHYSICAL EXAMINATION:  CARDIOVASCULAR:  Regular rate and rhythm. PULMONARY:  Minor subcutaneous emphysema, left anterior chest wall. Breath sounds are decreased at the base. ABDOMEN:  Soft, nontender.  Bowel sounds present. EXTREMITIES:  Trace left lower extremity wound and right lower extremity wound is clean, dry, and continuing to heal.  The patient's potassium was found to be 3.6, today this has been supplemented.  He is going to be given a laxative of choice for constipation provided he has had a bowel movement.  He remains afebrile. Vital signs stable and pending morning round evaluation, and will be surgically stable for discharge on February 03, 2011.  Latest laboratory studies are as follows.  BMET done on February 02, 2011, potassium 3.6 which has been supplemented, sodium 139, BUN and creatinine 9 and 0.7 respectively.  CBC on this date H and H 11.4 and 32.4, platelet count 144,000, white blood cell count of 8400.  After, it was stated that the patient was transferred from intensive care unit to [facility] for further convalescence.  The patient was found to have acute blood loss anemia. His H and H went as low as 11.2 and 32.8.  He did not require postoperative transfusion.  He was also found to have mild thrombocytopenia, his platelet count went down to 121,000, however, his last platelet count was up to 144,000 at the time of this dictation. Last chest x-ray done on February 02, 2011, showed improving aeration, residual right base atelectasis and small bilateral pleural effusions.  DISCHARGE INSTRUCTIONS:  Include the following.  DIET:  Low sodium, heart  healthy.  ACTIVITY:  The patient may walk up steps.  He may shower.  He is not to lift more than 10 pounds for 4 weeks.  He is not to drive until after 4 weeks.  He is to continue with his breathing exercise daily.  He is to walk every day and increase his frequency and duration as tolerates.  WOUND CARE:  He is to use soap and water on his wounds.  He may let his wounds open to the air.  He has been encouraged to stop smoking.  He is on a nicotine patch and will be given a number to him on smoking cessation classes.  FOLLOWUP APPOINTMENTS: 1. The patient has an appointment to see Dr. Laneta Simmers on February 24, 2011,     at 2 p.m.  A 45 minutes prior to this office  appointment, a chest x-     ray will be obtained. 2. The patient is to contact Dr. Prescott Gum office for followup     appointment in 2 weeks.  DISCHARGE MEDICATIONS:  Include the following; 1. Enteric-coated aspirin 325 mg p.o. daily. 2. Mucinex 600 mg p.o. 2 times daily as needed for cough. 3. Lopressor 25 mg 1/2 tablet p.o. 2 times daily. 4. NicoDerm CQ 14 mg/24 hours transdermally daily. 5. Oxycodone 5 mg 1-2 tabs p.o. q.4-6 hours p.o. p.r.n. pain. 6. Crestor 10 mg p.o. at bedtime. 7. Prilosec 20 mg p.o. q.a.m.  Please note the patient was not discharged on an ACE as he has a preserved EF and his blood pressure has been in the low 100's and a BB is sufficient at this time.     Doree Fudge, PA   ______________________________ Evelene Croon, M.D.    DZ/MEDQ  D:  02/02/2011  T:  02/02/2011  Job:  [id number]  cc:   Arturo Morton. Riley Kill, MD, [facility] Bevelyn Buckles. Bensimhon, MD  Electronically Signed by Doree Fudge PA on 02/03/2011 10:01:13 AM Electronically Signed by Evelene Croon M.D. on 03/09/2011 03:56:06 PM

## 2011-03-09 NOTE — Op Note (Signed)
Derrick Stokes, ALCOCK NO.:  [id number]  MEDICAL RECORD NO.:  [id number]  LOCATION:  2303                         FACILITY:  [facility]  PHYSICIAN:  Evelene Croon, M.D.     DATE OF BIRTH:  Mar 11, 1951  DATE OF PROCEDURE:  01/30/2011 DATE OF DISCHARGE:                              OPERATIVE REPORT   PREOPERATIVE DIAGNOSIS:  Severe 3-vessel coronary disease with unstable angina.  POSTOPERATIVE DIAGNOSIS:  Severe 3-vessel coronary disease with unstable angina.  OPERATIVE PROCEDURE:  Median sternotomy, extracorporeal circulation, coronary artery bypass graft surgery x5 using a left internal mammary graft to left anterior descending coronary, with a saphenous vein graft to the diagonal branch of the LAD, a saphenous vein graft to the obtuse marginal branch of the left circumflex coronary artery, and a sequential saphenous vein graft to posterior descending and posterolateral branches of the right coronary artery.  Endoscopic vein harvesting from the right leg.  ATTENDING SURGEON:  Evelene Croon, MD  ASSISTANT:  Stephanie Acre Dasovich, PA-C  ANESTHESIA:  General endotracheal.  CLINICAL HISTORY:  This patient is a 60 year old gentleman who presented with a 3-4 week history of progressive left chest pain and numbness in his left arm initially with exertion but then occurring with rest. Yesterday morning he developed severe chest pain, nausea, and dizziness while walking.  He had normal electrocardiogram and normal cardiac enzymes on admission but continued to have some chest discomfort and was taken to cath lab which showed severe 3-vessel disease.  The LAD had 50% proximal and 90-95% proximal to midvessel stenosis at the takeoff of the large first diagonal branch.  The first diagonal itself had about 70% proximal stenosis.  There was an intermediate moderate sized, he had 40% insignificant stenosis.  There was a small first marginal at about 90% stenosis.  The left  circumflex had about 75% stenosis after this first marginal and about 75% stenosis at the takeoff of a second marginal branch.  The right coronary artery was dominant vessel with 70% proximal and 70% distal stenosis before the posterolateral branch.  Left ventricular ejection fraction was about 50%.  After review of catheterization and examination, it was felt the patient should have coronary artery bypass graft surgery to decrease his risk of further ischemia and infarction.  He did have some mild chest discomfort postcatheterization.  I discussed the operative procedure with the patient including alternatives, benefits, and risks including but not limited to bleeding, blood transfusion, infection, stroke, myocardial infarction, graft failure, and death.  Also discussed the importance of maximum cardiac risk factor reduction including complete smoking cessation.  He said they understood and agreed to proceed.  OPERATIVE PROCEDURE:  The patient was taken to operating room and placed on table in supine position.  After induction of general endotracheal anesthesia, a Foley catheter was placed in the bladder using sterile technique.  Then the chest, abdomen, and both lower extremities were prepped and draped in usual sterile manner.  The chest was entered through a median sternotomy incision.  The pericardium was opened midline.  Examination of the heart showed good ventricular contractility.  The ascending aorta had no palpable plaques in it.  Then the left internal mammary  artery was harvested from the chest wall as pedicle graft.  This is a medium caliber vessel with excellent blood flow through it.  At the same time, segment of greater saphenous vein was harvested from the right leg using endoscopic vein harvest technique.  This vein was a medium size and good quality.  Then the patient was heparinized and an adequate ACT was obtained.  The distal ascending aorta was cannulated using  a 20-French aortic cannula for arterial inflow.  Venous outflow was achieved using a 2-stage venous cannula for the right atrial appendage.  An antegrade cardioplegia and neck cannula was inserted to the aortic root.  The patient was placed on cardiopulmonary bypass and distal coronaries identified.  The LAD was a large vessel with mild segmental disease distally.  The first diagonal of large vessel was graftable with no significant distal disease in it.  The intermediate had no significant disease palpable in it.  The obtuse marginal was a moderate-sized vessel that was graftable with mild segmental disease in it.  The posterior descending and posterolateral branches were both medium-sized vessels. They were graftable.  Then the aorta was crossclamped and 500 mL of cold blood antegrade cardioplegia was administered in the aortic root with quick arrest of the heart.  Systemic hypothermia to 20 degrees centigrade and topical hypothermic iced saline was used.  A temperature probe was placed in septum with insulating pad in the pericardium.  The first distal anastomosis was performed in the obtuse marginal branch.  The internal diameter was 1.75 mm.  Conduit used was a segment of greater saphenous vein and the anastomosis formed end-to-side manner with continuous 7-0 Prolene suture.  Flow noted through the graft was excellent.  The second distal anastomosis was performed to the posterior descending coronary artery.  The internal diameter was 1.6 mm.  The conduit used was a second segment of greater saphenous vein.  The anastomosis was performed in a sequential side-to-side manner using continuous 7-0 Prolene suture.  Flow was noted through the graft and was excellent.  The third distal anastomosis was performed at the posterolateral branch of the right coronary artery.  The internal diameter was only about 1.5 mm where I could see it grafted.  The conduit used was a same segment  of greater saphenous vein and the anastomosis was performed in a sequential end-to-side manner using continuous 7-0 Prolene suture.  Flow was noted through the graft and was excellent.  Another dose of cardioplegia was given down vein grafts and aortic root.  The fourth distal anastomosis was performed in the diagonal branch.  The internal diameter of this vessel was 1.75 mm.  The conduit used was a third segment of greater saphenous vein and the anastomosis was performed in a end-to-side manner using continuous 7-0 Prolene suture. Flow was noted through the graft and was excellent.  The fifth distal anastomosis was performed to the distal LAD.  The internal diameter of this vessel was about 2 mm.  Conduit used was a left internal mammary graft and was brought through an opening of left pericardium, anterior to the phrenic nerve.  He was anastomosed to the LAD in end-to-side manner using continuous 8-0 Prolene suture.  The pedicle was sutured to the epicardium with 6-0 Prolene sutures.  The patient was rewarmed to 37 degrees centigrade.  Another dose of cardioplegia was given.  With the crossclamp in place, 3 proximal vein graft anastomoses were performed to the mid ascending aorta in end-to- side manner using continuous  6-0 Prolene suture.  Then the clamp was removed from mammary pedicle.  There was rapid warming of the ventricular septum and return of spontaneous ventricular fibrillation. Crossclamp was removed with time of 82 minutes and the patient spontaneously converted to sinus rhythm.  The proximal and distal anastomoses appeared hemostatic while the grafts were satisfactory. Graft markers were placed around the proximal anastomoses.  Two temporary right ventricular and right atrial pacing wires were placed above the skin.  When the patient rewarmed to 37 degrees centigrade, he was weaned from cardiopulmonary bypass on no inotropic agents.  Total bypass time was 97 minutes.   Cardiac function appeared excellent with cardiac output of 6 liters per minute.  Protamine was given and the venous and the aortic cannula was removed without difficulty.  Hemostasis was achieved.  Three chest tubes were placed with tube in the posterior pericardium, one in the left pleural space, one in the anterior mediastinum.  Then the pericardium was loosely reapproximated over the heart.  Sternum was closed with double #6 stainless steel wires.  The fascia was closed with continuous #1 Vicryl suture.  Subcutaneous tissue was closed with continuous 2-0 Vicryl and skin with 3-0 Vicryl subcuticular closure. Lower extremity vein harvest site was closed in layers in a similar manner.  Sponge, needle, and instrument counts were correct per the scrub nurse.  Dry sterile dressing applied over the incisions, around the chest tubes which were Pleur-Evac suctioned.  The patient remained hemodynamically stable and transported to the SICU in guarded and stable condition.     Evelene Croon, M.D.     BB/MEDQ  D:  01/30/2011  T:  01/31/2011  Job:  [id number]  cc:   [facility]  Cardiology  Electronically Signed by Evelene Croon M.D. on 03/09/2011 03:56:02 PM

## 2011-03-09 NOTE — Consult Note (Signed)
Derrick Stokes, Derrick Stokes NO.:  [id number]  MEDICAL RECORD NO.:  [id number]  LOCATION:  2303                         FACILITY:  [facility]  PHYSICIAN:  Evelene Croon, M.D.     DATE OF BIRTH:  1950-12-11  DATE OF CONSULTATION:  01/30/2011 DATE OF DISCHARGE:                                CONSULTATION   REFERRING PHYSICIAN:  Arturo Morton. Riley Kill, MD, [facility]  REASON FOR CONSULTATION:  Severe three-vessel coronary artery disease with unstable angina.  CLINICAL HISTORY:  I was asked by Dr. Riley Kill to evaluate Derrick Stokes for consideration of coronary artery bypass graft surgery.  He is a 60 year old gentleman with a long heavy smoking history as well as a positive family history of heart disease who presents with a 3-4 week history of left-sided chest discomfort described as dull pain associated with numbness and tingling in his left arm.  These symptoms initially occur with exertion, but have been progressing and he has had some symptoms at rest.  This morning, he was having a walk with his daughter outside and developed 10/10 left-sided chest pain said it was associated with left arm tingling, nausea, and dizziness.  He was seen in the emergency room. His symptoms had improved, but were still present.  His electrocardiogram showed normal sinus rhythm with no acute changes.  His initial cardiac enzymes were negative.  He continued to have some discomfort and, therefore, was taken to the cath lab which showed severe three-vessel disease.  The LAD had 50% proximal followed by a 90-95% proximal and mid stenosis at the takeoff of the first diagonal branch. The first diagonal itself had about 70% ostial stenosis.  The mid left circumflex further down at about 60% stenosis.  There was a small-to- moderate sized intermediate with 40% stenosis.  The first marginal was small and had 90% proximal stenosis.  The left circumflex then had 75% stenosis sequentially before a large marginal  branch.  The right coronary artery was a dominant vessel at 70% midvessel stenosis as well as a 70% stenosis just at the takeoff of the large posterolateral branch.  Left ventricular ejection fraction was about 50%.  There was no gradient across the aortic valve and no mitral regurgitation.  The patient did have some mild discomfort postcatheterization, but by the time I saw him was no longer having any pain and was on nitroglycerin.  REVIEW OF SYSTEMS:  GENERAL:  He denies any fever or chills.  He denies any fatigue.  He has had no weight changes.  EYES:  Negative.  ENT: Negative.  ENDOCRINE:  He denies diabetes and hypothyroidism. CARDIOVASCULAR:  As above.  He denies PND and orthopnea.  He has had no peripheral edema or palpitations.  RESPIRATORY:  He denies cough and sputum production.  GI:  He has had no nausea or vomiting.  He denies melena and bright red blood per rectum.  GU:  He denies dysuria and hematuria.  MUSCULOSKELETAL:  He has had some lower back problems. NEUROLOGICAL:  He denies any focal weakness or numbness.  He does have some chronic pain in his legs related to spinal trauma.  He has never had TIA or stroke.  ALLERGIES:  To IVP DYE and CONTRAST.  He is allergic to [facility].  PAST MEDICAL HISTORY:  Significant for hypertension, although he is not treated.  He had surgery on his left ankle related to trauma.  SOCIAL HISTORY:  He is married, lives with his wife.  He has a 80 pack- year smoking history.  He does drink about 6 beers per day.  FAMILY HISTORY:  His father died of myocardial infarction.  He is 60 years old.  PHYSICAL EXAMINATION:  VITAL SIGNS:  Blood pressure is 128/86, pulse is 82 and regular, respiratory rate is 16 and unlabored. GENERAL:  He is a well-developed white male, in no distress. HEENT:  Normocephalic and atraumatic.  Pupils are equal and reactive to light and accommodation.  Extraocular muscles are intact.  His oropharynx is  clear. NECK:  Normal carotid pulses bilaterally.  There are no bruits.  There is no adenopathy or thyromegaly. CARDIAC:  Regular rate and rhythm with normal S1 and S2.  There is no murmur, rub or gallop. LUNGS:  Clear. ABDOMEN:  Active bowel sounds.  His abdomen is soft and nontender. There are no palpable masses or organomegaly. EXTREMITIES:  No peripheral edema.  Pedal pulses are palpable bilaterally. SKIN:  Warm and dry. NEUROLOGIC:  Alert and oriented x3.  Motor and sensory exams are grossly normal.  IMPRESSION:  Derrick Stokes has severe three-vessel coronary artery disease with unstable anginal symptoms.  I agree that coronary artery bypass graft surgery is the best treatment made for the ischemia and infarction.  I discussed the operative procedure with him including alternatives, benefits, and risks including, but not limited to bleeding, blood transfusion, infection, stroke, myocardial infarction, graft failure, and death.  He understands all of this and agrees to proceed.  We will plan to proceed with surgery on January 30, 2011.     Evelene Croon, M.D.     BB/MEDQ  D:  01/30/2011  T:  01/31/2011  Job:  [id number]  Electronically Signed by Evelene Croon M.D. on 03/09/2011 03:55:59 PM

## 2011-03-17 NOTE — Consult Note (Signed)
NAMEMEARLE, DREW NO.:  [id number]  MEDICAL RECORD NO.:  [id number]  LOCATION:  2905                         FACILITY:  [facility]  PHYSICIAN:  Bevelyn Buckles. Bensimhon, MDDATE OF BIRTH:  [birth year]  DATE OF CONSULTATION:  01/29/2011 DATE OF DISCHARGE:                                CONSULTATION   PRIMARY CARE PHYSICIAN:  Titus Dubin. Alwyn Ren, MD, FACP, FCCP.  REASON FOR CONSULTATION:  Chest pain.  Mr. Shiflet is a very pleasant 60 year old male with a history of COPD with heavy ongoing tobacco use at 2 packs a day, also has a history of alcohol abuse.  He denies any known history of coronary artery disease. He has never had a cardiac catheterization.  He did have a stress test about 5 years ago by Dr. Alwyn Ren which, his wife said, was normal.  He states that over the past month or so, he has been having progressive episodes of exertional angina.  Over the last day or two, this got much worse culminating this morning when he had episode of severe chest pain while walking out to see his wife.  This was accompanied by nausea, dizziness, and radiation to his left arm.  Wife drove him to the ER.  He was treated with sublingual nitroglycerin with significant relief, though he is still having about 1/10 pain.  First set of cardiac markers and initial EKG are normal.  REVIEW OF SYSTEMS:  He denies any bleeding.  He is not a diabetic.  He has been compliant with his other medications.  He denies any claudication, though he says that his had some nerve damage and he does have some pain in his legs.  Remainder review of systems, all systems are negative except for HPI and problem list.  PROBLEM LIST: 1. Chronic obstructive pulmonary disease with ongoing tobacco use 2     packs a day. 2. Alcohol abuse. 3. Gastroesophageal reflux disease. 4. History of hypertension, currently well controlled.  CURRENT MEDICATIONS:  Prilosec 20 a day.  ALLERGIES:  IODINE and SHELLFISH.   She is unaware if he is allergic to contrast dye.  SOCIAL HISTORY:  He is married.  He is retired.  He has history of tobacco use 2 packs a day x40 years which is ongoing.  He also drinks 6 beers daily.  He denies any history of GI bleeding.  FAMILY HISTORY:  Father had coronary artery disease, died at age 72. Mother died in her 43s with a history of dementia.  PHYSICAL EXAMINATION:  GENERAL:  He is somewhat anxious appearing, but in no acute distress.  Respirations are unlabored. VITAL SIGNS:  Blood pressure is 125/84, his heart rate is in the low 70s, he is sating 100% on 2 L nasal cannula, he is afebrile. HEENT:  Normal. NECK:  Supple.  JVP is about 8 cm of water.  Carotids are 2+ bilaterally with no obvious bruits, though it is hard hear.  There is no lymphadenopathy or thyromegaly. CARDIAC:  PMI is not palpable.  He has distant heart sounds.  He is regular with no obvious murmurs. LUNGS:  Decreased air movement throughout.  No obvious wheezing. ABDOMEN:  Obese, but nontender, nondistended.  No audible bruits. EXTREMITIES:  Warm with no cyanosis, clubbing, or edema. NEUROLOGIC:  He is alert and oriented x3.  Cranial nerves II through XII are intact.  Moves all 4 extremities without difficulty.  Affect is pleasant.  Somewhat anxious.  EKG shows sinus rhythm at a rate of 83.  No ST-T wave abnormalities. Labs show a white count of 7.9, hemoglobin of 17, platelets of 217. Sodium 140, potassium 3.8, creatinine of 0.7, and glucose is 77. Troponin is negative.  ASSESSMENT: 1. Unstable angina. 2. Chronic obstructive pulmonary disease with ongoing tobacco use. 3. Alcohol abuse. 4. History of SHELLFISH and IODINE allergy.  PLAN/DISCUSSION:  Mr. Latterell symptoms are quite concerning for unstable angina.  He is having some mild ongoing pain and we will start him on IV nitroglycerin and plan for cath at his next case.  I suspect he will have multivessel disease.  We will await the  results of cath. We will treat him gently with beta-blocker as tolerated.     Bevelyn Buckles. Bensimhon, MD     DRB/MEDQ  D:  01/29/2011  T:  01/29/2011  Job:  [id number]  cc:   Titus Dubin. Alwyn Ren, MD,FACP,FCCP  Electronically Signed by Arvilla Meres MD on 03/17/2011 05:17:36 PM

## 2011-04-02 ENCOUNTER — Other Ambulatory Visit: Payer: Medicare Other | Admitting: *Deleted

## 2011-04-03 ENCOUNTER — Other Ambulatory Visit (INDEPENDENT_AMBULATORY_CARE_PROVIDER_SITE_OTHER): Payer: Medicare Other | Admitting: *Deleted

## 2011-04-03 DIAGNOSIS — Z951 Presence of aortocoronary bypass graft: Secondary | ICD-10-CM

## 2011-04-03 DIAGNOSIS — E785 Hyperlipidemia, unspecified: Secondary | ICD-10-CM

## 2011-04-03 LAB — LIPID PANEL
Cholesterol: 139 mg/dL (ref 0–200)
HDL: 41 mg/dL (ref >39.00)
LDL Cholesterol: 75 mg/dL (ref 0–99)
Triglycerides: 116 mg/dL (ref 0.0–149.0)

## 2011-04-03 LAB — HEPATIC FUNCTION PANEL
AST: 20 U/L (ref 0–37)
Albumin: 4.1 g/dL (ref 3.5–5.2)
Alkaline Phosphatase: 57 U/L (ref 39–117)
Total Protein: 7.1 g/dL (ref 6.0–8.3)

## 2011-04-03 LAB — BASIC METABOLIC PANEL
Chloride: 106 mEq/L (ref 96–112)
Creatinine, Ser: 0.7 mg/dL (ref 0.4–1.5)
Sodium: 139 mEq/L (ref 135–145)

## 2011-07-07 ENCOUNTER — Ambulatory Visit: Payer: Medicare Other | Admitting: Cardiology

## 2011-07-08 ENCOUNTER — Ambulatory Visit: Payer: Medicare Other | Admitting: Cardiology

## 2011-07-21 HISTORY — PX: CORONARY ARTERY BYPASS GRAFT: SHX141

## 2011-07-28 ENCOUNTER — Encounter: Payer: Self-pay | Admitting: Cardiology

## 2011-10-12 ENCOUNTER — Telehealth: Payer: Self-pay | Admitting: Cardiology

## 2011-10-12 NOTE — Telephone Encounter (Signed)
OK to proceed per Dr. Clifton James.

## 2011-10-12 NOTE — Telephone Encounter (Signed)
New problem;  Per Stanton Kidney, patient at office now for cleaning please advise on pre-med.

## 2011-10-28 ENCOUNTER — Encounter: Payer: Self-pay | Admitting: Gastroenterology

## 2012-01-16 ENCOUNTER — Encounter (HOSPITAL_COMMUNITY): Payer: Self-pay | Admitting: Emergency Medicine

## 2012-01-16 ENCOUNTER — Emergency Department (HOSPITAL_COMMUNITY)
Admission: EM | Admit: 2012-01-16 | Discharge: 2012-01-16 | Disposition: A | Discharge status: Home / Self Care | Payer: Medicare Other | Attending: Emergency Medicine | Admitting: Emergency Medicine

## 2012-01-16 ENCOUNTER — Emergency Department (HOSPITAL_COMMUNITY): Payer: Medicare Other

## 2012-01-16 DIAGNOSIS — Z87891 Personal history of nicotine dependence: Secondary | ICD-10-CM | POA: Diagnosis not present

## 2012-01-16 DIAGNOSIS — N23 Unspecified renal colic: Secondary | ICD-10-CM | POA: Diagnosis not present

## 2012-01-16 DIAGNOSIS — I2510 Atherosclerotic heart disease of native coronary artery without angina pectoris: Secondary | ICD-10-CM | POA: Insufficient documentation

## 2012-01-16 DIAGNOSIS — R109 Unspecified abdominal pain: Secondary | ICD-10-CM | POA: Diagnosis not present

## 2012-01-16 DIAGNOSIS — Z8739 Personal history of other diseases of the musculoskeletal system and connective tissue: Secondary | ICD-10-CM | POA: Insufficient documentation

## 2012-01-16 DIAGNOSIS — R110 Nausea: Secondary | ICD-10-CM | POA: Diagnosis not present

## 2012-01-16 DIAGNOSIS — N134 Hydroureter: Secondary | ICD-10-CM | POA: Diagnosis not present

## 2012-01-16 DIAGNOSIS — Z7982 Long term (current) use of aspirin: Secondary | ICD-10-CM | POA: Insufficient documentation

## 2012-01-16 DIAGNOSIS — R1032 Left lower quadrant pain: Secondary | ICD-10-CM | POA: Diagnosis not present

## 2012-01-16 DIAGNOSIS — Z951 Presence of aortocoronary bypass graft: Secondary | ICD-10-CM | POA: Diagnosis not present

## 2012-01-16 LAB — CBC
HCT: 45.5 % (ref 39.0–52.0)
MCH: 30.9 pg (ref 26.0–34.0)
MCHC: 35.8 g/dL (ref 30.0–36.0)
MCV: 86.2 fL (ref 78.0–100.0)
Platelets: 195 10*3/uL (ref 150–400)
RDW: 13 % (ref 11.5–15.5)

## 2012-01-16 LAB — POCT I-STAT, CHEM 8
Calcium, Ion: 1.17 mmol/L (ref 1.12–1.32)
Glucose, Bld: 112 mg/dL — ABNORMAL HIGH (ref 70–99)
HCT: 50 % (ref 39.0–52.0)
TCO2: 21 mmol/L (ref 0–100)

## 2012-01-16 LAB — URINALYSIS, ROUTINE W REFLEX MICROSCOPIC
Ketones, ur: NEGATIVE mg/dL
Nitrite: NEGATIVE
Specific Gravity, Urine: 1.027 (ref 1.005–1.030)
Urobilinogen, UA: 1 mg/dL (ref 0.0–1.0)

## 2012-01-16 LAB — URINE MICROSCOPIC-ADD ON

## 2012-01-16 MED ORDER — SODIUM CHLORIDE 0.9 % IV SOLN
INTRAVENOUS | Status: DC
  Administered 2012-01-16: 08:00:00 via INTRAVENOUS

## 2012-01-16 MED ORDER — KETOROLAC TROMETHAMINE 30 MG/ML IJ SOLN
30.0000 mg | Freq: Once | INTRAMUSCULAR | Status: AC
  Administered 2012-01-16: 30 mg via INTRAVENOUS

## 2012-01-16 MED ORDER — TAMSULOSIN HCL 0.4 MG PO CAPS: 0.4000 mg | ORAL_CAPSULE | Freq: Every day | ORAL | Status: DC

## 2012-01-16 MED ORDER — OXYCODONE-ACETAMINOPHEN 5-325 MG PO TABS
2.0000 | ORAL_TABLET | ORAL | Status: AC | PRN
Start: 2012-01-16 — End: 2012-01-26

## 2012-01-16 MED ORDER — ONDANSETRON HCL 4 MG PO TABS: 8.0000 mg | ORAL_TABLET | Freq: Three times a day (TID) | ORAL | Status: AC | PRN

## 2012-01-16 MED ORDER — HYDROMORPHONE HCL PF 1 MG/ML IJ SOLN
1.0000 mg | Freq: Once | INTRAMUSCULAR | Status: AC
  Administered 2012-01-16: 1 mg via INTRAVENOUS
  Filled 2012-01-16: qty 1

## 2012-01-16 MED ORDER — IBUPROFEN 800 MG PO TABS: 800.0000 mg | ORAL_TABLET | Freq: Three times a day (TID) | ORAL | Status: AC

## 2012-01-16 MED ORDER — KETOROLAC TROMETHAMINE 30 MG/ML IJ SOLN
INTRAMUSCULAR | Status: AC
  Filled 2012-01-16: qty 1

## 2012-01-16 MED ORDER — IOHEXOL 300 MG/ML  SOLN
20.0000 mL | INTRAMUSCULAR | Status: AC
  Administered 2012-01-16: 20 mL via ORAL

## 2012-01-16 MED ORDER — IOHEXOL 300 MG/ML  SOLN
100.0000 mL | Freq: Once | INTRAMUSCULAR | Status: AC | PRN
  Administered 2012-01-16: 100 mL via INTRAVENOUS

## 2012-01-16 MED ORDER — ONDANSETRON HCL 4 MG/2ML IJ SOLN
4.0000 mg | Freq: Once | INTRAMUSCULAR | Status: AC
  Administered 2012-01-16: 4 mg via INTRAVENOUS
  Filled 2012-01-16: qty 2

## 2012-01-16 NOTE — ED Notes (Signed)
Family at bedside. 

## 2012-01-16 NOTE — ED Notes (Signed)
PT. REPORTS LLQ PAIN WITH NAUSEA ONSET THIS MORNING , DENIES VOMITTING OR DIARRHEA ,NO FEVER OR CHILLS.

## 2012-01-16 NOTE — ED Provider Notes (Cosign Needed)
History     CSN: [id number]  Arrival date & time 01/16/12  9147   First MD Initiated Contact with Patient 01/16/12 [phone]      Chief Complaint  Patient presents with  . Abdominal Pain    (Consider location/radiation/quality/duration/timing/severity/associated sxs/prior treatment) HPI Complains of low bowel pain suprapubic area nonradiating onset 3 AM today sharp and severe not made better or worse by anything last bowel movement this morning slight amount, watery patient has feeling of postvoid residual. Urinated a small amount. Admits to nausea no vomiting nothing makes symptoms better or worse. No treatment prior to coming here. No other associated symptoms Patient reports similar pain last week resolved spontaneously without treatment Past Medical History  Diagnosis Date  . Peripheral neuropathy     From trauma  . B12 deficiency     Borderline  . Arthritis   . Kidney stones   . Hiatal hernia    Coronary artery disease Past Surgical History  Procedure Date  . Colonoscopy     Neg  . Epidural steroids    CABG Family History  Problem Relation Age of Onset  . Diabetes Father   . Heart attack Father 20    History  Substance Use Topics  . Smoking status: Former Smoker -- 1.0 packs/day    Quit date: 01/29/2011  . Smokeless tobacco: Former Neurosurgeon    Types: Chew  . Alcohol Use: No     No alcohol abuse since February 2010   Social history positive smoker drinks 12 drinks per week no illicit drug use   Review of Systems  Constitutional: Negative.   HENT: Negative.   Respiratory: Negative.   Cardiovascular: Negative.   Gastrointestinal: Positive for nausea, abdominal pain and constipation.  Genitourinary: Positive for dysuria.  Musculoskeletal: Negative.   Skin: Negative.   Neurological: Negative.   Hematological: Negative.   Psychiatric/Behavioral: Negative.   All other systems reviewed and are negative.    Allergies  Iodine and Iohexol  Home Medications    Current Outpatient Rx  Name Route Sig Dispense Refill  . ASPIRIN 325 MG PO TABS Oral Take 325 mg by mouth daily.      [name] METOPROLOL TARTRATE 25 MG PO TABS Oral Take 0.5 tablets (12.5 mg total) by mouth 2 (two) times daily. 90 tablet 3  . OMEPRAZOLE 40 MG PO CPDR Oral Take 40 mg by mouth daily.      [name] ROSUVASTATIN CALCIUM 10 MG PO TABS Oral Take 1 tablet (10 mg total) by mouth daily. 90 tablet 3    BP 116/98  Pulse 74  Temp 97.4 F (36.3 C) (Oral)  Resp 20  SpO2 100%  Physical Exam  Nursing note and vitals reviewed. Constitutional: He appears well-developed and well-nourished. He appears distressed.       Appears uncomfortable  HENT:  Head: Normocephalic and atraumatic.  Eyes: Conjunctivae are normal. Pupils are equal, round, and reactive to light.  Neck: Neck supple. No tracheal deviation present. No thyromegaly present.  Cardiovascular: Normal rate and regular rhythm.   No murmur heard. Pulmonary/Chest: Effort normal and breath sounds normal.  Abdominal: Soft. Bowel sounds are normal. He exhibits no distension and no mass. There is tenderness. There is no rebound and no guarding.       Left lower quadrant tenderness  Genitourinary: Rectum normal, prostate normal and penis normal. Guaiac negative stool.  Musculoskeletal: Normal range of motion. He exhibits no edema and no tenderness.  Neurological: He is alert. Coordination normal.  Skin:  Skin is warm and dry. No rash noted.  Psychiatric: He has a normal mood and affect.    ED Course  Procedures (including critical care time) 8:38 AM feels much improved after treatment with intravenous narcotic and antiemetic 11:10 AM requesting more pain medicine, iv Toradol ordered 12:25 PM patient resting comfortably feels ready to home  Labs Reviewed  URINALYSIS, ROUTINE W REFLEX MICROSCOPIC   No results found. Results for orders placed during the hospital encounter of 01/16/12  URINALYSIS, ROUTINE W REFLEX MICROSCOPIC       Component Value Range   Color, Urine AMBER (*) YELLOW   APPearance CLEAR  CLEAR   Specific Gravity, Urine 1.027  1.005 - 1.030   pH 6.0  5.0 - 8.0   Glucose, UA NEGATIVE  NEGATIVE mg/dL   Hgb urine dipstick LARGE (*) NEGATIVE   Bilirubin Urine SMALL (*) NEGATIVE   Ketones, ur NEGATIVE  NEGATIVE mg/dL   Protein, ur 30 (*) NEGATIVE mg/dL   Urobilinogen, UA 1.0  0.0 - 1.0 mg/dL   Nitrite NEGATIVE  NEGATIVE   Leukocytes, UA NEGATIVE  NEGATIVE  OCCULT BLOOD, POC DEVICE      Component Value Range   Fecal Occult Bld NEGATIVE    CBC      Component Value Range   WBC 7.5  4.0 - 10.5 K/uL   RBC 5.28  4.22 - 5.81 MIL/uL   Hemoglobin 16.3  13.0 - 17.0 g/dL   HCT 16.1  09.6 - 04.5 %   MCV 86.2  78.0 - 100.0 fL   MCH 30.9  26.0 - 34.0 pg   MCHC 35.8  30.0 - 36.0 g/dL   RDW 40.9  81.1 - 91.4 %   Platelets 195  150 - 400 K/uL  POCT I-STAT, CHEM 8      Component Value Range   Sodium 139  135 - 145 mEq/L   Potassium 4.3  3.5 - 5.1 mEq/L   Chloride 105  96 - 112 mEq/L   BUN 17  6 - 23 mg/dL   Creatinine, Ser 7.82  0.50 - 1.35 mg/dL   Glucose, Bld 956 (*) 70 - 99 mg/dL   Calcium, Ion 2.13  0.86 - 1.32 mmol/L   TCO2 21  0 - 100 mmol/L   Hemoglobin 17.0  13.0 - 17.0 g/dL   HCT 57.8  46.9 - 62.9 %  URINE MICROSCOPIC-ADD ON      Component Value Range   Squamous Epithelial / LPF FEW (*) RARE   WBC, UA 0-2  <3 WBC/hpf   RBC / HPF 7-10  <3 RBC/hpf   Urine-Other MUCOUS PRESENT     Ct Abdomen Pelvis W Contrast  01/16/2012  *RADIOLOGY REPORT*  Clinical Data: New-onset left lower quadrant pain  CT ABDOMEN AND PELVIS WITH CONTRAST  Technique:  Multidetector CT imaging of the abdomen and pelvis was performed following the standard protocol during bolus administration of intravenous contrast.  Contrast: OMNIPAQUE IOHEXOL 300 MG/ML  SOLN  Comparison: None.  Findings: Dependent changes and atelectasis noted within the lung bases.  No pericardial or pleural effusion.  The liver is normal.  The  gallbladder is unremarkable.  No biliary dilatation.  Normal appearance of the pancreas.  The spleen is normal.  The stomach appears normal.  The small bowel loops are unremarkable.  A few scattered colonic diverticula identified.  The colonic wall appears mildly thickened however I suspect this is due to incomplete distention.  There is no pericolonic inflammatory change  or free fluid.  The adrenal glands both appear normal.  Normal appearance of the right kidney.  There is asymmetric left-sided hydronephrosis and hydroureter.  Within the distal left ureter at the level of the UVJ there is a 4 mm calculus, image 83.  The urinary bladder appears normal.  There is no upper abdominal adenopathy.  No pelvic or inguinal adenopathy.  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  There is calcified atherosclerotic disease affecting the abdominal aorta and its branches.  IMPRESSION:  1.  Distal left ureteral calculus measures 4 mm resulting in left- sided hydronephrosis and hydroureter.  Original Report Authenticated By: Rosealee Albee, M.D.     No diagnosis found.    MDM   Plan Rx/s percocet, ibuprofen , flomax , zofran urine strainer, referral Dr. Brunilda Payor  Dx Urtetal colic      Doug Sou, MD 01/16/12 1231

## 2012-01-16 NOTE — ED Notes (Signed)
Discussed with patient his reaction hx and he states he got "white in the face and vomited" following contrast for a procedure several years ago.  Since he has since had cardiac cath in 2012 without any reaction or premedications, Drs. Ethelda Chick and Rito Ehrlich have given their okay to proceed with full contrast exam.

## 2012-01-16 NOTE — Discharge Instructions (Signed)

## 2012-01-16 NOTE — ED Notes (Signed)
MD at bedside. 

## 2012-10-11 ENCOUNTER — Ambulatory Visit (INDEPENDENT_AMBULATORY_CARE_PROVIDER_SITE_OTHER): Payer: Medicare Other

## 2012-10-11 ENCOUNTER — Other Ambulatory Visit: Payer: Medicare Other

## 2012-10-11 ENCOUNTER — Other Ambulatory Visit: Payer: Self-pay

## 2012-10-11 VITALS — BP 130/80 | HR 84 | Ht 68.0 in | Wt 174.8 lb

## 2012-10-11 DIAGNOSIS — R079 Chest pain, unspecified: Secondary | ICD-10-CM

## 2012-10-11 NOTE — Progress Notes (Signed)
Patient walked in office thought he had appointment today with Tereso Newcomer PA.Appointment is tomorrow 10/12/12 with Lorin Picket.While out in lobby patient had chest pain lasting a second or less.Patient stated he has been having chest pain for 1 month or more just has not told any body.No chest pain at present.EKG was done.B/P130/80 pulse 84. DOD Dr.Cooper advised to have a troponin level and keep appointment with Tereso Newcomer PA 10/12/12 at 11:30 AM.Patient advised to go to ER if needed.

## 2012-10-12 ENCOUNTER — Encounter: Payer: Self-pay | Admitting: Physician Assistant

## 2012-10-12 ENCOUNTER — Other Ambulatory Visit: Payer: Medicare Other

## 2012-10-12 ENCOUNTER — Ambulatory Visit (INDEPENDENT_AMBULATORY_CARE_PROVIDER_SITE_OTHER): Payer: Medicare Other | Admitting: Physician Assistant

## 2012-10-12 VITALS — BP 130/84 | Ht 68.0 in | Wt 175.0 lb

## 2012-10-12 DIAGNOSIS — M79609 Pain in unspecified limb: Secondary | ICD-10-CM

## 2012-10-12 DIAGNOSIS — R5383 Other fatigue: Secondary | ICD-10-CM

## 2012-10-12 DIAGNOSIS — E785 Hyperlipidemia, unspecified: Secondary | ICD-10-CM

## 2012-10-12 DIAGNOSIS — Z720 Tobacco use: Secondary | ICD-10-CM

## 2012-10-12 DIAGNOSIS — R5381 Other malaise: Secondary | ICD-10-CM

## 2012-10-12 DIAGNOSIS — I2510 Atherosclerotic heart disease of native coronary artery without angina pectoris: Secondary | ICD-10-CM | POA: Diagnosis not present

## 2012-10-12 DIAGNOSIS — R079 Chest pain, unspecified: Secondary | ICD-10-CM | POA: Diagnosis not present

## 2012-10-12 DIAGNOSIS — F17200 Nicotine dependence, unspecified, uncomplicated: Secondary | ICD-10-CM

## 2012-10-12 LAB — BASIC METABOLIC PANEL
BUN: 15 mg/dL (ref 6–23)
CO2: 25 mEq/L (ref 19–32)
Chloride: 99 mEq/L (ref 96–112)
Creatinine, Ser: 0.8 mg/dL (ref 0.4–1.5)
Potassium: 4 mEq/L (ref 3.5–5.1)

## 2012-10-12 LAB — TSH: TSH: 0.78 u[IU]/mL (ref 0.35–5.50)

## 2012-10-12 MED ORDER — ALBUTEROL SULFATE HFA 108 (90 BASE) MCG/ACT IN AERS: INHALATION_SPRAY | RESPIRATORY_TRACT | Status: DC

## 2012-10-12 MED ORDER — METOPROLOL TARTRATE 12.5 MG HALF TABLET: 12.5000 mg | ORAL_TABLET | Freq: Two times a day (BID) | ORAL | Status: DC

## 2012-10-12 MED ORDER — ATORVASTATIN CALCIUM 40 MG PO TABS: 40.0000 mg | ORAL_TABLET | Freq: Every day | ORAL | Status: DC

## 2012-10-12 MED ORDER — NITROGLYCERIN 0.4 MG SL SUBL: 0.4000 mg | SUBLINGUAL_TABLET | SUBLINGUAL | Status: DC | PRN

## 2012-10-12 NOTE — Patient Instructions (Addendum)
**Note De-Identified Lion Fernandez Obfuscation** Your physician has recommended you make the following change in your medication: start taking Metoprolol 12.5 mg twice daily, Atorvastatin 40 mg at bedtime, 1 to 2 puffs Albuterol inhaler every 4 to 6 hours as needed for cough and Nitroglycerin as needed for chest pain (please follow directions given to you at todays visit and on bottle)  Your physician recommends that you return for lab work in: today  Your physician has requested that you have en exercise stress myoview. For further information please visit [email or web address] Please follow instruction sheet, as given.  A chest x-ray takes a picture of the organs and structures inside the chest, including the heart, lungs, and blood vessels. This test can show several things, including, whether the heart is enlarges; whether fluid is building up in the lungs; and whether pacemaker / defibrillator leads are still in place.  Your physician has requested that you have an ankle brachial index (ABI). During this test an ultrasound and blood pressure cuff are used to evaluate the arteries that supply the arms and legs with blood. Allow thirty minutes for this exam. There are no restrictions or special instructions.  Your physician recommends that you schedule a follow-up appointment in: 2 to 3 weeks

## 2012-10-12 NOTE — Progress Notes (Signed)
[address], [address] [place], [place]  [address] Phone: [phone] Fax:  [phone]  Date:  10/12/2012   ID:  Derrick Stokes, DOB 27-Apr-1951, MRN [id number]  PCP:  Marga Melnick, MD  Primary Cardiologist:  Dr. Valera Castle     History of Present Illness: HASSEL UPHOFF is a 62 y.o. male who returns for the evaluation of chest pain.  He has a hx of CAD, s/p CABG, HTN, GERD, tobacco abuse.  CABG was performed in 7/12 (LIMA-LAD, SVG-diagonal, SVG-OM, SVG-PDA and PL). Last seen by Dr. Daleen Squibb in 8/12. Patient walked into the office yesterday and noted chest pain. His ECG was reviewed with Dr. Excell Seltzer (DOD). Patient had a troponin drawn which was negative.  He was asked to keep his appointment with me today.  He notes CP off and for 1 month.  Seems to be more severe lately.  He typically has it every day.  It is a heavy sensation along a with a sharp pain that is brief (seconds).  He may get short of breath with it.  He may get nauseated at times.  No diaphoresis or radiating symptoms.  He remains active.  He denies exertional CP or DOE.  He has been able to continue to clear brush off his property or chop wood without chest pain.  He does feel more fatigued.  Denies orthopnea, PND, edema.  No syncope.  He does note a feeling of fluttering in his chest when he has palpitations. Of note, he has had a cough increased in frequency recently with sputum production that is clear.  It seems to be improving.  No fever.  He does wheeze.    Labs (6/13):  K 4.3, Cr 0.80, Hgb 17 Labs (3/14):  Tn negative   Wt Readings from Last 3 Encounters:  10/12/12 175 lb (79.379 kg)  10/11/12 174 lb 12.8 oz (79.289 kg)  03/06/11 177 lb (80.287 kg)     Past Medical History  Diagnosis Date  . Peripheral neuropathy     From trauma  . B12 deficiency     Borderline  . Arthritis   . Kidney stones   . Hiatal hernia   . CAD (coronary artery disease)     a. s/p CABG 01/2011  . HLD (hyperlipidemia)   .  Tobacco abuse     Current Outpatient Prescriptions  Medication Sig Dispense Refill  . aspirin EC 81 MG tablet Take 81 mg by mouth daily.      [name] omeprazole (PRILOSEC) 20 MG capsule Take 20 mg by mouth daily.       No current facility-administered medications for this visit.    Allergies:    Allergies  Allergen Reactions  . Iodine Nausea And Vomiting    Turns white in face and sweaty  . Iohexol     May have caused nausea and vomiting several years ago   . Shrimp (Shellfish Allergy) Nausea And Vomiting    Turns white in face and sweaty    Social History:  The patient  reports that he has been smoking.  He has quit using smokeless tobacco. His smokeless tobacco use included Chew. He reports that he does not drink alcohol.   ROS:  Please see the history of present illness.   He does note bilateral leg pain with walking that he attributes to his neuropathy but it seems to be worse.    All other systems reviewed and negative.   PHYSICAL EXAM: VS:  BP  130/84  Ht 5\' 8"  (1.727 m)  Wt 175 lb (79.379 kg)  BMI 26.61 kg/m2 Well nourished, well developed, in no acute distress HEENT: normal Neck: no JVD Vascular:  No carotid bruit; diminished DP/PT on the left, R DP/PT 2+ Cardiac:  normal S1, S2; RRR; no murmur Lungs:  Decreased breath sounds bilaterally, no wheezing, rhonchi or rales Abd: soft, nontender, no hepatomegaly Ext: no edema Skin: warm and dry Neuro:  CNs 2-12 intact, no focal abnormalities noted  EKG:  NSR, HR 81, NSSTTW changes     ASSESSMENT AND PLAN:  1. Chest Pain:  He has somewhat atypical symptoms.  There are some remote similarities to the pain he had prior to his CABG but these are not as bad.  He continues to smoke.  He has not taken Crestor in a year due to cost.  He is also not on his beta blocker.  He is quite concerned about his symptoms and admits to being scared that his pain is from his heart.  Troponin yesterday was normal.  I will arrange an ETT-Myoview.   Check CBC, BMET, TSH. 2. Coronary Artery Disease:  Arrange stress test as noted.  Restart metoprolol 12.5 mg bid.  Will give a Rx for NTG prn.   3. Hyperlipidemia:  Restart statin therapy with Atorvastatin 40 mg QD. 4. Tobacco Abuse:  I suspect he has COPD based on his symptoms.  Obtain a CXR.  I will give him a Rx for Albuterol prn.  If his stress test is normal, I would recommend he f/u with his PCP for continued management. 5. Leg Pain:  He has diminished pulses.  He continues to smoke and has CAD.  I will arrange ABIs. 6. GERD:  Continue PPI. 7. Disposition:  F/u with me in 2 weeks.   Signed, Tereso Newcomer, PA-C  12:12 PM 10/12/2012

## 2012-10-17 ENCOUNTER — Ambulatory Visit (HOSPITAL_COMMUNITY): Payer: Medicare Other | Attending: Cardiology | Admitting: Radiology

## 2012-10-17 VITALS — BP 110/82 | HR 67 | Ht 68.0 in | Wt 174.0 lb

## 2012-10-17 DIAGNOSIS — R0609 Other forms of dyspnea: Secondary | ICD-10-CM | POA: Diagnosis not present

## 2012-10-17 DIAGNOSIS — I4949 Other premature depolarization: Secondary | ICD-10-CM | POA: Diagnosis not present

## 2012-10-17 DIAGNOSIS — R079 Chest pain, unspecified: Secondary | ICD-10-CM

## 2012-10-17 DIAGNOSIS — R0602 Shortness of breath: Secondary | ICD-10-CM | POA: Diagnosis not present

## 2012-10-17 DIAGNOSIS — Z8249 Family history of ischemic heart disease and other diseases of the circulatory system: Secondary | ICD-10-CM | POA: Diagnosis not present

## 2012-10-17 DIAGNOSIS — R5381 Other malaise: Secondary | ICD-10-CM | POA: Insufficient documentation

## 2012-10-17 DIAGNOSIS — I2510 Atherosclerotic heart disease of native coronary artery without angina pectoris: Secondary | ICD-10-CM | POA: Diagnosis not present

## 2012-10-17 DIAGNOSIS — R0989 Other specified symptoms and signs involving the circulatory and respiratory systems: Secondary | ICD-10-CM | POA: Insufficient documentation

## 2012-10-17 DIAGNOSIS — R002 Palpitations: Secondary | ICD-10-CM | POA: Insufficient documentation

## 2012-10-17 DIAGNOSIS — R5383 Other fatigue: Secondary | ICD-10-CM | POA: Insufficient documentation

## 2012-10-17 DIAGNOSIS — F17200 Nicotine dependence, unspecified, uncomplicated: Secondary | ICD-10-CM | POA: Insufficient documentation

## 2012-10-17 DIAGNOSIS — I10 Essential (primary) hypertension: Secondary | ICD-10-CM | POA: Insufficient documentation

## 2012-10-17 MED ORDER — TECHNETIUM TC 99M SESTAMIBI GENERIC - CARDIOLITE
33.0000 | Freq: Once | INTRAVENOUS | Status: AC | PRN
  Administered 2012-10-17: 33 via INTRAVENOUS

## 2012-10-17 MED ORDER — TECHNETIUM TC 99M SESTAMIBI GENERIC - CARDIOLITE
11.0000 | Freq: Once | INTRAVENOUS | Status: AC | PRN
  Administered 2012-10-17: 11 via INTRAVENOUS

## 2012-10-17 NOTE — Progress Notes (Signed)
[facility] SITE 3 NUCLEAR MED [address] [place], [place] [address] [phone]    Cardiology Nuclear Med Study  Derrick Stokes is a 62 y.o. male     MRN : [id number]     DOB: August 17, 1950  Procedure Date: 10/17/2012  Nuclear Med Background Indication for Stress Test:  Evaluation for Ischemia and Graft Patency History:  '04 QQV:ZDGLOV, EF=57%; '12 CABG, EF=50% Cardiac Risk Factors: Family History - CAD, Hypertension, Lipids and Smoker  Symptoms:  Chest Pain/Pressure with and without Exertion (last episode of chest discomfort is now, 3/10), DOE, Fatigue, Palpitations and Rapid HR   Nuclear Pre-Procedure Caffeine/Decaff Intake:  4:00pm NPO After: 4:00pm   Lungs:  Clear. O2 Sat: 96% on room air. IV 0.9% NS with Angio Cath:  20g  IV Site: R Antecubital x 1, tolerated well IV Started by:  Irean Hong, RN  Chest Size (in):  40 Cup Size: n/a  Height: 5\' 8"  (1.727 m)  Weight:  174 lb (78.926 kg)  BMI:  Body mass index is 26.46 kg/(m^2). Tech Comments:  Held Lopressor x 24 hrs    Nuclear Med Study 1 or 2 day study: 1 day  Stress Test Type:  Stress  Reading MD: Olga Millers, MD  Order Authorizing Provider:  Valera Castle, MD, and Tereso Newcomer, PA-C  Resting Radionuclide: Technetium 28m Sestamibi  Resting Radionuclide Dose: 11.0 mCi   Stress Radionuclide:  Technetium 44m Sestamibi  Stress Radionuclide Dose: 33.0 mCi           Stress Protocol Rest HR: 67 Stress HR: 142  Rest BP: 110/82 Stress BP: 173/98  Exercise Time (min): 7:01 METS: 7.0   Predicted Max HR: 160 bpm % Max HR: 88.75 bpm Rate Pressure Product: 56433   Dose of Adenosine (mg):  n/a Dose of Lexiscan: n/a mg  Dose of Atropine (mg): n/a Dose of Dobutamine: n/a mcg/kg/min (at max HR)  Stress Test Technologist: Smiley Houseman, CMA-N  Nuclear Technologist:  Doyne Keel, CNMT     Rest Procedure:  Myocardial perfusion imaging was performed at rest 45 minutes following the intravenous administration of  Technetium 64m Sestamibi.  Rest ECG: NSR - Normal EKG  Stress Procedure:  The patient exercised on the treadmill utilizing the Bruce Protocol for 7:01 minutes. The patient stopped due to fatigue and dyspnea.  He did c/o chest pressure with exercise.  Technetium 29m Sestamibi was injected at peak exercise and myocardial perfusion imaging was performed after a brief delay.  Stress ECG: No significant ST segment change suggestive of ischemia.  QPS Raw Data Images:  Acquisition technically good; normal left ventricular size. Stress Images:  There is decreased uptake in the inferior wall. Rest Images:  There is decreased uptake in the inferior wall. Subtraction (SDS):  No evidence of ischemia. Transient Ischemic Dilatation (Normal <1.22):  0.90 Lung/Heart Ratio (Normal <0.45):  0.31  Quantitative Gated Spect Images QGS EDV:  78 ml QGS ESV:  32 ml  Impression Exercise Capacity:  Fair exercise capacity. BP Response:  Normal blood pressure response. Clinical Symptoms:  There is chest pain and dyspnea. ECG Impression:  No significant ST segment change suggestive of ischemia. Comparison with Prior Nuclear Study: No images to compare  Overall Impression:  Low risk stress nuclear study with a small, moderate intensity, fixed defect in the inferior basal wall consistent with thinning vs small prior infarct; no ischemia.  LV Ejection Fraction: 59%.  LV Wall Motion:  NL LV Function; NL Wall Motion  Arlys John  Jimya Ciani

## 2012-10-18 ENCOUNTER — Encounter: Payer: Self-pay | Admitting: Physician Assistant

## 2012-10-19 ENCOUNTER — Encounter (INDEPENDENT_AMBULATORY_CARE_PROVIDER_SITE_OTHER): Payer: Medicare Other

## 2012-10-19 DIAGNOSIS — I739 Peripheral vascular disease, unspecified: Secondary | ICD-10-CM | POA: Diagnosis not present

## 2012-10-20 ENCOUNTER — Telehealth: Payer: Self-pay | Admitting: *Deleted

## 2012-10-20 ENCOUNTER — Ambulatory Visit
Admission: RE | Admit: 2012-10-20 | Discharge: 2012-10-20 | Disposition: A | Discharge status: Home / Self Care | Payer: Medicare Other | Source: Ambulatory Visit | Attending: Physician Assistant | Admitting: Physician Assistant

## 2012-10-20 DIAGNOSIS — R079 Chest pain, unspecified: Secondary | ICD-10-CM

## 2012-10-20 NOTE — Telephone Encounter (Signed)
no answer, will try again tomorrow

## 2012-10-20 NOTE — Telephone Encounter (Signed)
Message copied by Tarri Fuller on Thu Oct 20, 2012  5:17 PM ------      Message from: [place], [place] T      Created: Thu Oct 20, 2012  1:49 PM       chest X-ray ok with no acute findings      Tereso Newcomer, PA-C  1:49 PM 10/20/2012 ------

## 2012-10-21 ENCOUNTER — Encounter: Payer: Self-pay | Admitting: Physician Assistant

## 2012-10-21 NOTE — Telephone Encounter (Signed)
s/w pt's wife Eunice Blase, she has been notified about pt's test results with verbal understanding today

## 2012-10-24 ENCOUNTER — Ambulatory Visit (INDEPENDENT_AMBULATORY_CARE_PROVIDER_SITE_OTHER): Payer: Medicare Other | Admitting: Physician Assistant

## 2012-10-24 ENCOUNTER — Encounter: Payer: Self-pay | Admitting: Physician Assistant

## 2012-10-24 VITALS — BP 130/92 | HR 68 | Ht 68.0 in | Wt 180.0 lb

## 2012-10-24 DIAGNOSIS — I2510 Atherosclerotic heart disease of native coronary artery without angina pectoris: Secondary | ICD-10-CM | POA: Diagnosis not present

## 2012-10-24 DIAGNOSIS — F17200 Nicotine dependence, unspecified, uncomplicated: Secondary | ICD-10-CM | POA: Diagnosis not present

## 2012-10-24 DIAGNOSIS — R5383 Other fatigue: Secondary | ICD-10-CM

## 2012-10-24 DIAGNOSIS — E785 Hyperlipidemia, unspecified: Secondary | ICD-10-CM

## 2012-10-24 DIAGNOSIS — R5381 Other malaise: Secondary | ICD-10-CM

## 2012-10-24 DIAGNOSIS — R079 Chest pain, unspecified: Secondary | ICD-10-CM

## 2012-10-24 DIAGNOSIS — I10 Essential (primary) hypertension: Secondary | ICD-10-CM

## 2012-10-24 LAB — LIPID PANEL
LDL Cholesterol: 117 mg/dL — ABNORMAL HIGH (ref 0–99)
VLDL: 25.8 mg/dL (ref 0.0–40.0)

## 2012-10-24 LAB — CBC WITH DIFFERENTIAL/PLATELET
Basophils Absolute: 0 10*3/uL (ref 0.0–0.1)
Eosinophils Relative: 1 % (ref 0.0–5.0)
Hemoglobin: 17.5 g/dL — ABNORMAL HIGH (ref 13.0–17.0)
Lymphocytes Relative: 21 % (ref 12.0–46.0)
Monocytes Relative: 5.2 % (ref 3.0–12.0)
Neutro Abs: 8 10*3/uL — ABNORMAL HIGH (ref 1.4–7.7)
Platelets: 186 10*3/uL (ref 150.0–400.0)
RDW: 12.8 % (ref 11.5–14.6)
WBC: 11.1 10*3/uL — ABNORMAL HIGH (ref 4.5–10.5)

## 2012-10-24 LAB — HEPATIC FUNCTION PANEL
AST: 22 U/L (ref 0–37)
Alkaline Phosphatase: 65 U/L (ref 39–117)
Total Bilirubin: 0.9 mg/dL (ref 0.3–1.2)

## 2012-10-24 NOTE — Patient Instructions (Addendum)
Your physician recommends that you continue on your current medications as directed. Please refer to the Current Medication list given to you today.  Your physician recommends that you schedule a follow-up appointment with Dr. Alwyn Ren  Your physician recommends that you return for a FASTING lipid profile/lfts in two months  Your physician recommends that you have lab work today: cbc  Your physician wants you to follow-up in: 6 months with Dr. Theodoro Parma will receive a reminder letter in the mail two months in advance. If you don't receive a letter, please call our office to schedule the follow-up appointment.

## 2012-10-24 NOTE — Progress Notes (Signed)
[address], [address] [place], [place]  [address] Phone: [phone] Fax:  [phone]  Date:  10/24/2012   ID:  Derrick Stokes, DOB 08-07-1950, MRN [id number]  PCP:  Marga Melnick, MD  Primary Cardiologist:  Dr. Valera Castle     History of Present Illness: Derrick Stokes is a 62 y.o. male who returns for f/u on chest pain.  He has a hx of CAD, s/p CABG, HTN, GERD, tobacco abuse.  CABG was performed in 7/12 (LIMA-LAD, SVG-diagonal, SVG-OM, SVG-PDA and PL). He walked in to the office recently with complaints of CP.  A troponin was drawn and was negative.  He was concerned his symptoms are similar to what he had prior to his CABG. I set him up for a stress test. This was performed 4/1 and demonstrated inferior thinning versus scar, no ischemia. It was overall low risk.  Chest x-ray was also normal.  I gave him a prescription for albuterol when necessary.  He was off of his statin and this was restarted. He had some leg pain and I arranged ABIs. These were normal.  Since seen, he has not had as much CP.  These are brief and sharp.  No exertional symptoms.  No dyspnea.  No syncope.  He does have a significant chronic cough.  Notes lightheadedness at times with his cough.  Still smoking.  Labs (6/13):  K 4.3, Cr 0.80, Hgb 17 Labs (3/14):  Tn negative, K 4, creatinine 0.8, TSH 0.78   Wt Readings from Last 3 Encounters:  10/24/12 180 lb (81.647 kg)  10/17/12 174 lb (78.926 kg)  10/12/12 175 lb (79.379 kg)     Past Medical History  Diagnosis Date  . Peripheral neuropathy     From trauma  . B12 deficiency     Borderline  . Arthritis   . Kidney stones   . Hiatal hernia   . CAD (coronary artery disease)     a. s/p CABG 01/2011;  b. ETT Myoview 3/14:  Low risk, inf defect c/w thinning vs prior infarct, no ischemia, EF 59%  . HLD (hyperlipidemia)   . Tobacco abuse   . Leg pain     ABIs 4/14:  R 1.2, L 1.2, TBIs normal    Current Outpatient Prescriptions  Medication Sig  Dispense Refill  . albuterol (PROVENTIL HFA;VENTOLIN HFA) 108 (90 BASE) MCG/ACT inhaler Inhale 1 to 2 puffs every 4 to 6 hours as needed for cough  1 Inhaler  6  . aspirin EC 81 MG tablet Take 81 mg by mouth daily.      [name] atorvastatin (LIPITOR) 40 MG tablet Take 1 tablet (40 mg total) by mouth daily.  30 tablet  6  . metoprolol tartrate (LOPRESSOR) 12.5 mg TABS Take 0.5 tablets (12.5 mg total) by mouth 2 (two) times daily.  30 tablet  6  . nitroGLYCERIN (NITROSTAT) 0.4 MG SL tablet Place 1 tablet (0.4 mg total) under the tongue every 5 (five) minutes as needed for chest pain.  25 tablet  3  . omeprazole (PRILOSEC) 20 MG capsule Take 20 mg by mouth daily.       No current facility-administered medications for this visit.    Allergies:    Allergies  Allergen Reactions  . Iodine Nausea And Vomiting    Turns white in face and sweaty  . Iohexol     May have caused nausea and vomiting several years ago   . Shrimp (Shellfish Allergy) Nausea And Vomiting  Turns white in face and sweaty    Social History:  The patient  reports that he has been smoking.  He has quit using smokeless tobacco. His smokeless tobacco use included Chew. He reports that he does not drink alcohol.   ROS:  Please see the history of present illness.   Notes fatigue.   All other systems reviewed and negative.   PHYSICAL EXAM: VS:  BP 130/92  Pulse 68  Ht 5\' 8"  (1.727 m)  Wt 180 lb (81.647 kg)  BMI 27.38 kg/m2 Well nourished, well developed, in no acute distress HEENT: normal Neck: no JVD Cardiac:  normal S1, S2; RRR; no murmur Lungs:  Decreased breath sounds bilaterally, faint exp rhonchi at the bases Abd: soft, nontender, no hepatomegaly Ext: no edema Skin: warm and dry Neuro:  CNs 2-12 intact, no focal abnormalities noted  EKG:  NSR, HR 68, normal axis    ASSESSMENT AND PLAN:  1. Chest Pain:  With low risk myoview, no further cardiac workup.  His symptoms are atypical for ischemia.  He can f/u with his  PCP for further evaluation. 2. Coronary Artery Disease:  As noted, recent myoview low risk.  Continue ASA, statin.   3. Hypertension:  Borderline control.  Continue to monitor.  4. Hyperlipidemia:  Continue Atorvastatin 40 mg QD.  Check Lipids and LFTs in 8 weeks.   5. Tobacco Abuse:  We discussed the importance of quitting.  I suggest that he f/u with his PCP for further evaluation of probable COPD/cough. 6. Leg Pain:  ABIs normal.  No further workup. 7. GERD:  Continue PPI.  He has a hx of "early Barrett's."  He can f/u with his PCP to determine if he needs GI f/u. 8. Disposition:  Dr. Valera Castle is retiring soon.  I will have him f/u with Dr. Tonny Bollman (patient request) in 6 mos.  Signed, Tereso Newcomer, PA-C  9:02 AM 10/24/2012

## 2012-12-30 ENCOUNTER — Other Ambulatory Visit: Payer: Medicare Other

## 2013-01-02 ENCOUNTER — Ambulatory Visit (INDEPENDENT_AMBULATORY_CARE_PROVIDER_SITE_OTHER): Payer: Medicare Other | Admitting: *Deleted

## 2013-01-02 DIAGNOSIS — E785 Hyperlipidemia, unspecified: Secondary | ICD-10-CM | POA: Diagnosis not present

## 2013-01-02 LAB — LIPID PANEL
Cholesterol: 160 mg/dL (ref 0–200)
Triglycerides: 132 mg/dL (ref 0.0–149.0)

## 2013-01-02 LAB — HEPATIC FUNCTION PANEL
ALT: 22 U/L (ref 0–53)
AST: 17 U/L (ref 0–37)
Albumin: 3.8 g/dL (ref 3.5–5.2)
Alkaline Phosphatase: 60 U/L (ref 39–117)
Total Protein: 7.2 g/dL (ref 6.0–8.3)

## 2013-01-03 ENCOUNTER — Other Ambulatory Visit: Payer: Self-pay | Admitting: *Deleted

## 2013-01-03 DIAGNOSIS — E785 Hyperlipidemia, unspecified: Secondary | ICD-10-CM

## 2013-01-03 MED ORDER — ATORVASTATIN CALCIUM 80 MG PO TABS: 80.0000 mg | ORAL_TABLET | Freq: Every day | ORAL | Status: DC

## 2013-01-18 ENCOUNTER — Telehealth: Payer: Self-pay | Admitting: *Deleted

## 2013-01-18 MED ORDER — SIMVASTATIN 40 MG PO TABS: 40.0000 mg | ORAL_TABLET | Freq: Every day | ORAL | Status: DC

## 2013-01-18 NOTE — Telephone Encounter (Signed)
Pt wife is calling b/c atorvastatin is too high & pt would like to switch to more affordable statin. Dr. Daleen Squibb reviewed & recommended Simvastatin 40mg  qhs.  Order e-scribed. Pt & wife aware. Pt has repeat labs ordered already for September 2014 Mylo Red RN

## 2013-04-04 ENCOUNTER — Other Ambulatory Visit (INDEPENDENT_AMBULATORY_CARE_PROVIDER_SITE_OTHER): Payer: Medicare Other

## 2013-04-04 DIAGNOSIS — E785 Hyperlipidemia, unspecified: Secondary | ICD-10-CM | POA: Diagnosis not present

## 2013-04-04 LAB — HEPATIC FUNCTION PANEL
ALT: 17 U/L (ref 0–53)
AST: 15 U/L (ref 0–37)
Alkaline Phosphatase: 55 U/L (ref 39–117)
Bilirubin, Direct: 0.1 mg/dL (ref 0.0–0.3)
Total Bilirubin: 0.8 mg/dL (ref 0.3–1.2)

## 2013-04-04 LAB — LIPID PANEL: VLDL: 33 mg/dL (ref 0.0–40.0)

## 2013-04-06 ENCOUNTER — Telehealth: Payer: Self-pay | Admitting: *Deleted

## 2013-04-06 NOTE — Telephone Encounter (Signed)
pt's wife notified about lab results with verbal understanding 

## 2013-07-28 ENCOUNTER — Ambulatory Visit (INDEPENDENT_AMBULATORY_CARE_PROVIDER_SITE_OTHER): Payer: Medicare Other | Admitting: Cardiovascular Disease

## 2013-07-28 ENCOUNTER — Encounter: Payer: Self-pay | Admitting: Cardiovascular Disease

## 2013-07-28 VITALS — BP 114/60 | HR 87 | Ht 68.0 in | Wt 180.8 lb

## 2013-07-28 DIAGNOSIS — E785 Hyperlipidemia, unspecified: Secondary | ICD-10-CM

## 2013-07-28 DIAGNOSIS — Z951 Presence of aortocoronary bypass graft: Secondary | ICD-10-CM | POA: Diagnosis not present

## 2013-07-28 NOTE — Progress Notes (Signed)
HPI:  63 year old gentleman presenting for followup evaluation. The patient has coronary artery disease with previous CABG in 2012. His surgery involved a LIMA to LAD graft, saphenous vein graft to diagonal, saphenous vein graft obtuse marginal, saphenous vein graft sequential to PDA and PLA. He was seen in April of this year with chest pain and they nuclear stress test was performed. This demonstrated inferior thinning versus scar with no evidence of ischemia. It was felt to be a low risk study. The patient is also followed for hypertension and hyperlipidemia. Last lipids from September 2014 showed a cholesterol of 130, triglycerides 165, HDL 31, and LDL 66.  Overall the patient is doing well. He has had a few episodes of chest pain. His episode in April occurred after he picked up a very heavy object. In retrospect he thinks he may have had a muscle pull because the left chest was sore to touch the day following. He had another episode of chest discomfort last week after chopping wood. There was no pain or pressure during his physical activity. This was not as severe as his previous episode. Other than that, he has remained physically active. He does a lot of work in his yard. He has had no other exertional symptoms. He does have chronic dyspnea with exertion and relates this to smoking. He denies edema, orthopnea, or PND. He has a lot of problems with pain in his legs which is nearly constant. He has undergone evaluation and has been diagnosed with neuropathy.  Outpatient Encounter Prescriptions as of 07/28/2013  Medication Sig  . albuterol (PROVENTIL HFA;VENTOLIN HFA) 108 (90 BASE) MCG/ACT inhaler Inhale 1 to 2 puffs every 4 to 6 hours as needed for cough  . aspirin EC 81 MG tablet Take 81 mg by mouth daily.  . metoprolol tartrate (LOPRESSOR) 12.5 mg TABS Take 0.5 tablets (12.5 mg total) by mouth 2 (two) times daily.  . nitroGLYCERIN (NITROSTAT) 0.4 MG SL tablet Place 1 tablet (0.4 mg total) under  the tongue every 5 (five) minutes as needed for chest pain.  [name] omeprazole (PRILOSEC) 20 MG capsule Take 20 mg by mouth daily.  . simvastatin (ZOCOR) 40 MG tablet Take 1 tablet (40 mg total) by mouth at bedtime.    Allergies  Allergen Reactions  . Iodine Nausea And Vomiting    Turns white in face and sweaty  . Iohexol     May have caused nausea and vomiting several years ago   . Shrimp [Shellfish Allergy] Nausea And Vomiting    Turns white in face and sweaty    Past Medical History  Diagnosis Date  . Peripheral neuropathy     From trauma  . B12 deficiency     Borderline  . Arthritis   . Kidney stones   . Hiatal hernia   . CAD (coronary artery disease)     a. s/p CABG 01/2011;  b. ETT Myoview 3/14:  Low risk, inf defect c/w thinning vs prior infarct, no ischemia, EF 59%  . HLD (hyperlipidemia)   . Tobacco abuse   . Leg pain     ABIs 4/14:  R 1.2, L 1.2, TBIs normal    ROS: Negative except as per HPI  BP 114/60  Pulse 87  Ht 5\' 8"  (1.727 m)  Wt 180 lb 12.8 oz (82.01 kg)  BMI 27.50 kg/m2  PHYSICAL EXAM: Pt is alert and oriented, NAD HEENT: normal Neck: JVP - normal, carotids 2+= without bruits Lungs: CTA bilaterally CV: RRR  without murmur or gallop Abd: soft, NT, Positive BS, no hepatomegaly Ext: no C/C/E, distal pulses intact and equal Skin: warm/dry no rash  EKG:  Normal sinus rhythm 86 beats per minute, within normal limits.  ASSESSMENT AND PLAN: 1. Coronary artery disease, native vessel. The patient is status post CABG approximately 2-3 years ago. He does have some chest discomfort periodically with both typical and atypical symptoms. I think this may be musculoskeletal since it has occurred after certain physical activities where he's done heavy work with his arms. He will continue his same medical program and will call if symptoms worsen or become more frequent. I will see him back in 6 months for followup. He is appropriately on aspirin, beta blocker, and a  statin drug.  2. Essential hypertension. Blood pressure is well controlled on low-dose of metoprolol.  3. Hyperlipidemia. The patient remains on simvastatin. His lipids were reviewed and they were within range. He will continue the same.  4. Tobacco abuse. We discussed the critical importance of smoking cessation. He understands he is increasing his risk for bypass graft failure with continued smoking.  Sherren Mocha 07/28/2013 9:14 AM

## 2013-07-28 NOTE — Patient Instructions (Signed)
Your physician wants you to follow-up in: 6 months with Dr. Cooper.  You will receive a reminder letter in the mail two months in advance. If you don't receive a letter, please call our office to schedule the follow-up appointment.  Your physician recommends that you continue on your current medications as directed. Please refer to the Current Medication list given to you today.  

## 2013-09-25 ENCOUNTER — Emergency Department (HOSPITAL_COMMUNITY)
Admission: EM | Admit: 2013-09-25 | Discharge: 2013-09-25 | Disposition: A | Discharge status: Home / Self Care | Payer: Medicare Other | Attending: Emergency Medicine | Admitting: Emergency Medicine

## 2013-09-25 ENCOUNTER — Encounter (HOSPITAL_COMMUNITY): Payer: Self-pay | Admitting: Emergency Medicine

## 2013-09-25 ENCOUNTER — Emergency Department (HOSPITAL_COMMUNITY): Payer: Medicare Other

## 2013-09-25 DIAGNOSIS — I2510 Atherosclerotic heart disease of native coronary artery without angina pectoris: Secondary | ICD-10-CM | POA: Insufficient documentation

## 2013-09-25 DIAGNOSIS — Y9323 Activity, snow (alpine) (downhill) skiing, snow boarding, sledding, tobogganing and snow tubing: Secondary | ICD-10-CM | POA: Insufficient documentation

## 2013-09-25 DIAGNOSIS — Y9289 Other specified places as the place of occurrence of the external cause: Secondary | ICD-10-CM | POA: Insufficient documentation

## 2013-09-25 DIAGNOSIS — E785 Hyperlipidemia, unspecified: Secondary | ICD-10-CM | POA: Insufficient documentation

## 2013-09-25 DIAGNOSIS — W010XXA Fall on same level from slipping, tripping and stumbling without subsequent striking against object, initial encounter: Secondary | ICD-10-CM | POA: Insufficient documentation

## 2013-09-25 DIAGNOSIS — M129 Arthropathy, unspecified: Secondary | ICD-10-CM | POA: Insufficient documentation

## 2013-09-25 DIAGNOSIS — G609 Hereditary and idiopathic neuropathy, unspecified: Secondary | ICD-10-CM | POA: Diagnosis not present

## 2013-09-25 DIAGNOSIS — Z79899 Other long term (current) drug therapy: Secondary | ICD-10-CM | POA: Insufficient documentation

## 2013-09-25 DIAGNOSIS — S42253A Displaced fracture of greater tuberosity of unspecified humerus, initial encounter for closed fracture: Secondary | ICD-10-CM | POA: Diagnosis not present

## 2013-09-25 DIAGNOSIS — F17200 Nicotine dependence, unspecified, uncomplicated: Secondary | ICD-10-CM | POA: Diagnosis not present

## 2013-09-25 DIAGNOSIS — R209 Unspecified disturbances of skin sensation: Secondary | ICD-10-CM | POA: Diagnosis not present

## 2013-09-25 DIAGNOSIS — E538 Deficiency of other specified B group vitamins: Secondary | ICD-10-CM | POA: Insufficient documentation

## 2013-09-25 MED ORDER — OXYCODONE-ACETAMINOPHEN 5-325 MG PO TABS: 1.0000 | ORAL_TABLET | Freq: Four times a day (QID) | ORAL | Status: DC | PRN

## 2013-09-25 NOTE — ED Notes (Signed)
Ortho tec at bedside

## 2013-09-25 NOTE — ED Notes (Signed)
Pt alert, arrives from home, c/o left shoulder pain, onset was several weeks ago, fall during snow storm, PMS intact, no obvious deformity.

## 2013-09-25 NOTE — ED Provider Notes (Signed)
CSN: [id number]     Arrival date & time 09/25/13  1013 History   First MD Initiated Contact with Patient 09/25/13 1044     Chief Complaint  Patient presents with  . Shoulder Pain     (Consider location/radiation/quality/duration/timing/severity/associated sxs/prior Treatment) HPI Comments: Patient is a 63 year old male with hx of peripheral neuropathy, B12 deficiency, CAD who presents today with left shoulder pain. He reports that 3 weeks ago he fell directly on his shoulder after he slipped on the ice. He was playing with his granddaughter, pushing her on the sled when this happened. He denies LOC, confusion, or disorientation. Since that time he has had persistent left shoulder pain. The pain is worse with certain movements. He has chronic tingling and numbness in his hands which he does not feel is any worse after the injury. He has never injured this shoulder in the past. He denies any other injuries. No fevers, chills, nausea, vomiting, abdominal pain.  The history is provided by the patient. No language interpreter was used.    Past Medical History  Diagnosis Date  . Peripheral neuropathy     From trauma  . B12 deficiency     Borderline  . Arthritis   . Kidney stones   . Hiatal hernia   . CAD (coronary artery disease)     a. s/p CABG 01/2011;  b. ETT Myoview 3/14:  Low risk, inf defect c/w thinning vs prior infarct, no ischemia, EF 59%  . HLD (hyperlipidemia)   . Tobacco abuse   . Leg pain     ABIs 4/14:  R 1.2, L 1.2, TBIs normal   Past Surgical History  Procedure Laterality Date  . Colonoscopy      Neg  . Epidural steroids     Family History  Problem Relation Age of Onset  . Diabetes Father   . Heart attack Father 63   History  Substance Use Topics  . Smoking status: Current Every Day Smoker -- 1.00 packs/day    Types: Cigarettes    Last Attempt to Quit: 01/29/2011  . Smokeless tobacco: Former Systems developer    Types: Chew  . Alcohol Use: No     Comment: No alcohol abuse  since February 2010    Review of Systems  Constitutional: Negative for fever and chills.  Respiratory: Negative for shortness of breath.   Cardiovascular: Negative for chest pain.  Gastrointestinal: Negative for nausea, vomiting and abdominal pain.  Musculoskeletal: Positive for arthralgias and myalgias. Negative for back pain and gait problem.  All other systems reviewed and are negative.      Allergies  Iodine; Iohexol; and Shrimp  Home Medications   Current Outpatient Rx  Name  Route  Sig  Dispense  Refill  . albuterol (PROVENTIL HFA;VENTOLIN HFA) 108 (90 BASE) MCG/ACT inhaler      Inhale 1 to 2 puffs every 4 to 6 hours as needed for cough   1 Inhaler   6   . aspirin EC 81 MG tablet   Oral   Take 81 mg by mouth daily.         . metoprolol tartrate (LOPRESSOR) 12.5 mg TABS   Oral   Take 0.5 tablets (12.5 mg total) by mouth 2 (two) times daily.   30 tablet   6   . nitroGLYCERIN (NITROSTAT) 0.4 MG SL tablet   Sublingual   Place 1 tablet (0.4 mg total) under the tongue every 5 (five) minutes as needed for chest pain.  25 tablet   3   . omeprazole (PRILOSEC) 20 MG capsule   Oral   Take 20 mg by mouth daily.         [name] oxyCODONE-acetaminophen (PERCOCET/ROXICET) 5-325 MG per tablet   Oral   Take 1-2 tablets by mouth every 6 (six) hours as needed for severe pain.   12 tablet   0   . simvastatin (ZOCOR) 40 MG tablet   Oral   Take 1 tablet (40 mg total) by mouth at bedtime.   90 tablet   3    BP 104/80  Pulse 64  Temp(Src) 97.6 F (36.4 C) (Oral)  Resp 16  Wt 180 lb (81.647 kg)  SpO2 95% Physical Exam  Nursing note and vitals reviewed. Constitutional: He is oriented to person, place, and time. He appears well-developed and well-nourished. No distress.  HENT:  Head: Normocephalic and atraumatic.  Right Ear: External ear normal.  Left Ear: External ear normal.  Nose: Nose normal.  Eyes: Conjunctivae are normal.  Neck: Normal range of motion. No  tracheal deviation present.  Cardiovascular: Normal rate, regular rhythm, normal heart sounds, intact distal pulses and normal pulses.   Pulses:      Radial pulses are 2+ on the right side, and 2+ on the left side.  Pulmonary/Chest: Effort normal and breath sounds normal. No stridor.  Abdominal: Soft. He exhibits no distension. There is no tenderness.  Musculoskeletal: Normal range of motion.  TTP over left lateral shoulder. ROM limited due to pain. Compartment soft. Neurovascularly intact.  Grip strength 5/5 bilaterally.   Neurological: He is alert and oriented to person, place, and time.  Skin: Skin is warm and dry. He is not diaphoretic.  Psychiatric: He has a normal mood and affect. His behavior is normal.    ED Course  Procedures (including critical care time) Labs Review Labs Reviewed - No data to display Imaging Review Dg Shoulder Left  09/25/2013   CLINICAL DATA:  [place] on ice in February) left shoulder. Pain worsening.  EXAM: LEFT SHOULDER - 2+ VIEW  COMPARISON:  None.  FINDINGS: There is a nondisplaced fracture of the greater tuberosity, along its superior margin, only seen on the AP view. This involves the anterior facet, which is the supraspinous insertion.  No other fracture. The glenohumeral joint is normally spaced and aligned. There are minor AC joint osteoarthritic changes. Underlying ribs are intact. Soft tissues are unremarkable.  IMPRESSION: Nondisplaced fracture of the superior aspect of the greater tuberosity, specifically the anterior facet.   Electronically Signed   By: Lajean Manes M.D.   On: 09/25/2013 11:07     EKG Interpretation None      MDM   Final diagnoses:  Greater tuberosity of humerus fracture    Patient presents to ED after a fall 3 weeks ago. XR shows a nondisplaced fracture of the superior aspect of the greater tuberosity, specifically the anterior facet. Neurovascularly intact. Compartment soft. The patient was given a sling and orthopedic  follow up. Patient will call [facility] today for an appointment. Pt did not want pain medication during ED stay, but was given medication for home. Discussed no driving, operating heavy machinery, or combining with alcohol. Strict return instructions given. Vitals signs stable for discharge. Patient / Family / Caregiver informed of clinical course, understand medical decision-making process, and agree with plan.       Elwyn Lade, PA-C 09/25/13 [phone]

## 2013-09-25 NOTE — Discharge Instructions (Signed)
Shoulder Fracture (Proximal Humerus or Glenoid) A shoulder fracture is a broken upper arm bone or a broken socket bone. The humerus is the upper arm bone and the glenoid is the shoulder socket. Proximal means the humerus is broken near the shoulder. Most of the time the bones of a broken shoulder are in an acceptable position. Usually, the injury can be treated with a shoulder immobilizer or sling and swath bandage. These devices support the arm and prevent any shoulder movement. If the bones are not in a good position, then surgery is sometimes needed. Shoulder fractures usually initially cause swelling, pain, and discoloration around the upper arm. They heal in 8 to 12 weeks with proper treatment. SYMPTOMS  At the time of injury:  Pain.  Tenderness.  Regular body contours are not normal. Later symptoms may include:  Swelling and bruising of the elbow and hand.  Swelling and bruising of the arm or chest. Other symptoms include:  Pain when lifting or turning the arm.  Paralysis below the fracture.  Numbness or coldness below the fracture. CAUSES   Indirect force from falling on an outstretched arm.  A blow to the shoulder. RISK INCREASES WITH:  Not being in shape.  Playing contact sports, such as football, soccer, hockey, or rugby.  Sports where falling on an outstretched arm occurs, such as basketball, skateboarding, or volleyball.  History of bone or joint disease.  History of shoulder injury. PREVENTION  Warm up before activity.  Stretch before activity.  Stay in shape with your:  Heart fitness.  Flexibility.  Shoulder Strength.  Falling with the proper technique. PROGNOSIS  In adults, healing time is about 7 weeks. For children, healing time is about 5 weeks. Surgery may be needed. RELATED COMPLICATIONS  The bones do not heal together (nonunion).  The bones do not align properly when they heal (malunion).  Long-term problems with pain, stiffness,  swelling, or loss of motion.  The injured arm heals shorter than the other.  Nerves are injured in the arm.  Arthritis in the shoulder.  Normal bone growth is interrupted in children.  Blood supply to the shoulder joint is diminished. TREATMENT If the bones are aligned, then initial treatment will be with ice and medicine to help with pain. The shoulder will be held in place with a sling (immobilization). The shoulder will be allowed to heal for up to 6 weeks. Injuries that may need surgery include:  Severe fractures.  Fractures that are not in appropriate alignment (displaced).  Non-displaced fractures (not common). Surgery helps the bones align correctly. The bones may be held in place with:  Sutures.  Wires.  Rods.  Plates.  Screws.  Pins. If you have had surgery or not, you will likely be assisted by a physical therapist or athletic trainer to get the best results with your injured shoulder. This will likely include exercises to strengthen and stretch the injured and surrounding areas. MEDICATION  If pain medicine is needed, nonsteroidal anti-inflammatory medicines (such as aspirin or ibuprofen) or other minor pain relievers (such as acetaminophen) are often advised.  Do not take pain medicine for 7 days before surgery.  Stronger pain relievers may be prescribed. Use only as directed and take only as much as you need. COLD THERAPY Cold treatment (icing) relieves pain and reduces inflammation. Cold treatment should be applied for 10 to 15 minutes every 2 to 3 hours, and immediately after activity that aggravates your symptoms. Use ice packs or an ice massage. SEEK IMMEDIATE  MEDICAL CARE IF:  You have severe shoulder pain unrelieved by rest and taking pain medicine.  You have pain, numbness, tingling, or weakness in the hand or wrist.  You have shortness of breath, chest pain, severe weakness, or fainting.  You have severe pain with motion of the fingers or  wrist.  Blue, gray, or dark color appears in the fingernails on injured extremity. Document Released: 07/06/2005 Document Revised: 09/28/2011 Document Reviewed: 10/18/2008 [facility] Patient Information 2014 [place], [place].

## 2013-09-26 NOTE — ED Provider Notes (Signed)
  Medical screening examination/treatment/procedure(s) were performed by non-physician practitioner and as supervising physician I was immediately available for consultation/collaboration.     Carmin Muskrat, MD 09/26/13 [phone]

## 2013-09-28 DIAGNOSIS — S42253A Displaced fracture of greater tuberosity of unspecified humerus, initial encounter for closed fracture: Secondary | ICD-10-CM | POA: Diagnosis not present

## 2013-10-12 DIAGNOSIS — S42253A Displaced fracture of greater tuberosity of unspecified humerus, initial encounter for closed fracture: Secondary | ICD-10-CM | POA: Diagnosis not present

## 2013-10-26 ENCOUNTER — Encounter: Payer: Self-pay | Admitting: Gastroenterology

## 2013-11-03 ENCOUNTER — Other Ambulatory Visit: Payer: Self-pay | Admitting: Orthopaedic Surgery

## 2013-11-03 DIAGNOSIS — M25512 Pain in left shoulder: Secondary | ICD-10-CM

## 2013-11-08 ENCOUNTER — Ambulatory Visit
Admission: RE | Admit: 2013-11-08 | Discharge: 2013-11-08 | Disposition: A | Discharge status: Home / Self Care | Payer: Medicare Other | Source: Ambulatory Visit | Attending: Orthopaedic Surgery | Admitting: Orthopaedic Surgery

## 2013-11-08 DIAGNOSIS — S42253A Displaced fracture of greater tuberosity of unspecified humerus, initial encounter for closed fracture: Secondary | ICD-10-CM | POA: Diagnosis not present

## 2013-11-08 DIAGNOSIS — M25512 Pain in left shoulder: Secondary | ICD-10-CM

## 2013-11-15 DIAGNOSIS — M25519 Pain in unspecified shoulder: Secondary | ICD-10-CM | POA: Diagnosis not present

## 2013-11-15 DIAGNOSIS — S42253A Displaced fracture of greater tuberosity of unspecified humerus, initial encounter for closed fracture: Secondary | ICD-10-CM | POA: Diagnosis not present

## 2013-12-17 ENCOUNTER — Other Ambulatory Visit: Payer: Self-pay | Admitting: Physician Assistant

## 2013-12-28 ENCOUNTER — Encounter (HOSPITAL_COMMUNITY): Payer: Self-pay | Admitting: Emergency Medicine

## 2013-12-28 ENCOUNTER — Emergency Department (HOSPITAL_COMMUNITY)
Admission: EM | Admit: 2013-12-28 | Discharge: 2013-12-28 | Disposition: A | Discharge status: Home / Self Care | Payer: Medicare Other | Attending: Emergency Medicine | Admitting: Emergency Medicine

## 2013-12-28 ENCOUNTER — Emergency Department (HOSPITAL_COMMUNITY): Payer: Medicare Other

## 2013-12-28 DIAGNOSIS — Z7982 Long term (current) use of aspirin: Secondary | ICD-10-CM | POA: Diagnosis not present

## 2013-12-28 DIAGNOSIS — R079 Chest pain, unspecified: Secondary | ICD-10-CM | POA: Diagnosis not present

## 2013-12-28 DIAGNOSIS — Y929 Unspecified place or not applicable: Secondary | ICD-10-CM | POA: Insufficient documentation

## 2013-12-28 DIAGNOSIS — Z79899 Other long term (current) drug therapy: Secondary | ICD-10-CM | POA: Diagnosis not present

## 2013-12-28 DIAGNOSIS — J3489 Other specified disorders of nose and nasal sinuses: Secondary | ICD-10-CM | POA: Insufficient documentation

## 2013-12-28 DIAGNOSIS — I2510 Atherosclerotic heart disease of native coronary artery without angina pectoris: Secondary | ICD-10-CM | POA: Insufficient documentation

## 2013-12-28 DIAGNOSIS — M129 Arthropathy, unspecified: Secondary | ICD-10-CM | POA: Diagnosis not present

## 2013-12-28 DIAGNOSIS — R059 Cough, unspecified: Secondary | ICD-10-CM | POA: Diagnosis not present

## 2013-12-28 DIAGNOSIS — R0602 Shortness of breath: Secondary | ICD-10-CM | POA: Insufficient documentation

## 2013-12-28 DIAGNOSIS — Z951 Presence of aortocoronary bypass graft: Secondary | ICD-10-CM | POA: Diagnosis not present

## 2013-12-28 DIAGNOSIS — F17200 Nicotine dependence, unspecified, uncomplicated: Secondary | ICD-10-CM | POA: Diagnosis not present

## 2013-12-28 DIAGNOSIS — R0781 Pleurodynia: Secondary | ICD-10-CM

## 2013-12-28 DIAGNOSIS — Z87442 Personal history of urinary calculi: Secondary | ICD-10-CM | POA: Insufficient documentation

## 2013-12-28 DIAGNOSIS — Z8669 Personal history of other diseases of the nervous system and sense organs: Secondary | ICD-10-CM | POA: Insufficient documentation

## 2013-12-28 DIAGNOSIS — R05 Cough: Secondary | ICD-10-CM | POA: Insufficient documentation

## 2013-12-28 DIAGNOSIS — S298XXA Other specified injuries of thorax, initial encounter: Secondary | ICD-10-CM | POA: Insufficient documentation

## 2013-12-28 DIAGNOSIS — Z8719 Personal history of other diseases of the digestive system: Secondary | ICD-10-CM | POA: Insufficient documentation

## 2013-12-28 DIAGNOSIS — E785 Hyperlipidemia, unspecified: Secondary | ICD-10-CM | POA: Insufficient documentation

## 2013-12-28 DIAGNOSIS — Y9389 Activity, other specified: Secondary | ICD-10-CM | POA: Insufficient documentation

## 2013-12-28 DIAGNOSIS — X500XXA Overexertion from strenuous movement or load, initial encounter: Secondary | ICD-10-CM | POA: Insufficient documentation

## 2013-12-28 LAB — CBC WITH DIFFERENTIAL/PLATELET
BASOS ABS: 0 10*3/uL (ref 0.0–0.1)
BASOS PCT: 1 % (ref 0–1)
Eosinophils Absolute: 0.2 10*3/uL (ref 0.0–0.7)
Eosinophils Relative: 3 % (ref 0–5)
HEMATOCRIT: 45.7 % (ref 39.0–52.0)
HEMOGLOBIN: 15.7 g/dL (ref 13.0–17.0)
LYMPHS PCT: 34 % (ref 12–46)
Lymphs Abs: 2.3 10*3/uL (ref 0.7–4.0)
MCH: 30.6 pg (ref 26.0–34.0)
MCHC: 34.4 g/dL (ref 30.0–36.0)
MCV: 89.1 fL (ref 78.0–100.0)
MONO ABS: 0.6 10*3/uL (ref 0.1–1.0)
MONOS PCT: 10 % (ref 3–12)
NEUTROS ABS: 3.5 10*3/uL (ref 1.7–7.7)
NEUTROS PCT: 52 % (ref 43–77)
Platelets: 233 10*3/uL (ref 150–400)
RBC: 5.13 MIL/uL (ref 4.22–5.81)
RDW: 12.8 % (ref 11.5–15.5)
WBC: 6.6 10*3/uL (ref 4.0–10.5)

## 2013-12-28 LAB — BASIC METABOLIC PANEL
BUN: 10 mg/dL (ref 6–23)
CHLORIDE: 102 meq/L (ref 96–112)
CO2: 27 meq/L (ref 19–32)
CREATININE: 0.72 mg/dL (ref 0.50–1.35)
Calcium: 9.3 mg/dL (ref 8.4–10.5)
GFR calc non Af Amer: >90 mL/min (ref >90)
Glucose, Bld: 87 mg/dL (ref 70–99)
POTASSIUM: 4.3 meq/L (ref 3.7–5.3)
Sodium: 140 mEq/L (ref 137–147)

## 2013-12-28 LAB — I-STAT TROPONIN, ED: TROPONIN I, POC: 0 ng/mL (ref 0.00–0.08)

## 2013-12-28 MED ORDER — OXYCODONE-ACETAMINOPHEN 5-325 MG PO TABS: 2.0000 | ORAL_TABLET | Freq: Four times a day (QID) | ORAL | Status: DC | PRN

## 2013-12-28 MED ORDER — MORPHINE SULFATE 4 MG/ML IJ SOLN
4.0000 mg | Freq: Once | INTRAMUSCULAR | Status: AC
  Administered 2013-12-28: 4 mg via INTRAVENOUS
  Filled 2013-12-28: qty 1

## 2013-12-28 NOTE — ED Provider Notes (Signed)
Medical screening examination/treatment/procedure(s) were performed by non-physician practitioner and as supervising physician I was immediately available for consultation/collaboration.   EKG Interpretation   Date/Time:  Thursday December 28 2013 13:29:44 EDT Ventricular Rate:  56 PR Interval:  141 QRS Duration: 97 QT Interval:  429 QTC Calculation: 414 R Axis:   56 Text Interpretation:  Sinus rhythm RSR' in V1 or V2, right VCD or RVH No  significant change was found Confirmed by Wyvonnia Dusky  MD, Torianne Laflam [phone]) on  12/28/2013 1:40:13 PM       Ezequiel Essex, MD 12/28/13 1726

## 2013-12-28 NOTE — ED Notes (Signed)
Pt states that he had a hard sneeze last week and since has felt a popping in the center of his chest when he coughs or sneezes.  Thought it would get better but states that it hasn't.

## 2013-12-28 NOTE — Discharge Instructions (Signed)

## 2013-12-28 NOTE — ED Provider Notes (Signed)
CSN: [id number]     Arrival date & time 12/28/13  1011 History   First MD Initiated Contact with Patient 12/28/13 1132     Chief Complaint  Patient presents with  . Rib Pain      (Consider location/radiation/quality/duration/timing/severity/associated sxs/prior Treatment) HPI Comments: Patient presents to the emergency department with a chief complaint of chest pain. He states that the pain started approximately one week ago. He states that he had a large sneeze, and felt a pop in his ribs. He states that this is reproducible with coughing and sneezing. He states that he has moderate to severe pain with coughing and sneezing. States that the symptoms are not positional or exertional. He states that when he awakes in the morning, the pain is minimal, as he progresses throughout the day, the pain steadily increases x 1 week.  He states that he has felt similar after having a rib fracture, and wants to make certain he didn't break a rib.  The history is provided by the patient. No language interpreter was used.    Past Medical History  Diagnosis Date  . Peripheral neuropathy     From trauma  . B12 deficiency     Borderline  . Arthritis   . Kidney stones   . Hiatal hernia   . CAD (coronary artery disease)     a. s/p CABG 01/2011;  b. ETT Myoview 3/14:  Low risk, inf defect c/w thinning vs prior infarct, no ischemia, EF 59%  . HLD (hyperlipidemia)   . Tobacco abuse   . Leg pain     ABIs 4/14:  R 1.2, L 1.2, TBIs normal   Past Surgical History  Procedure Laterality Date  . Colonoscopy      Neg  . Epidural steroids     Family History  Problem Relation Age of Onset  . Diabetes Father   . Heart attack Father 78   History  Substance Use Topics  . Smoking status: Current Every Day Smoker -- 1.00 packs/day    Types: Cigarettes    Last Attempt to Quit: 01/29/2011  . Smokeless tobacco: Former Systems developer    Types: Chew  . Alcohol Use: No     Comment: No alcohol abuse since February 2010     Review of Systems  Constitutional: Negative for fever and chills.  Respiratory: Positive for shortness of breath.   Cardiovascular: Positive for chest pain.  Gastrointestinal: Negative for nausea, vomiting, diarrhea and constipation.  Genitourinary: Negative for dysuria.  Musculoskeletal: Positive for arthralgias.      Allergies  Iodine; Iohexol; and Shrimp  Home Medications   Prior to Admission medications   Medication Sig Start Date End Date Taking? Authorizing Provider  albuterol (PROVENTIL HFA;VENTOLIN HFA) 108 (90 BASE) MCG/ACT inhaler Inhale 1 to 2 puffs every 4 to 6 hours as needed for cough 10/12/12   Liliane Shi, PA-C  aspirin EC 81 MG tablet Take 81 mg by mouth daily.    Historical Provider, MD  metoprolol tartrate (LOPRESSOR) 25 MG tablet take 1/2 tablet by mouth twice a day    Sherren Mocha, MD  nitroGLYCERIN (NITROSTAT) 0.4 MG SL tablet Place 1 tablet (0.4 mg total) under the tongue every 5 (five) minutes as needed for chest pain. 10/12/12   Liliane Shi, PA-C  omeprazole (PRILOSEC) 20 MG capsule Take 20 mg by mouth daily.    Historical Provider, MD  oxyCODONE-acetaminophen (PERCOCET/ROXICET) 5-325 MG per tablet Take 1-2 tablets by mouth every 6 (six) hours  as needed for severe pain. 09/25/13   Elwyn Lade, PA-C  simvastatin (ZOCOR) 40 MG tablet Take 1 tablet (40 mg total) by mouth at bedtime. 01/18/13   Renella Cunas, MD   BP 116/80  Pulse 57  Temp(Src) 97.8 F (36.6 C) (Oral)  Resp 16  SpO2 100% Physical Exam  Nursing note and vitals reviewed. Constitutional: He is oriented to person, place, and time. He appears well-developed and well-nourished.  HENT:  Head: Normocephalic and atraumatic.  Eyes: Conjunctivae and EOM are normal. Pupils are equal, round, and reactive to light. Right eye exhibits no discharge. Left eye exhibits no discharge. No scleral icterus.  Neck: Normal range of motion. Neck supple. No JVD present.  Cardiovascular: Normal rate,  regular rhythm and normal heart sounds.  Exam reveals no gallop and no friction rub.   No murmur heard. Pulmonary/Chest: Effort normal and breath sounds normal. No respiratory distress. He has no wheezes. He has no rales. He exhibits no tenderness.  Chest wall is nontender to palpation, no bony abnormality or deformity, no bruising  Abdominal: Soft. He exhibits no distension and no mass. There is no tenderness. There is no rebound and no guarding.  Musculoskeletal: Normal range of motion. He exhibits no edema and no tenderness.  Neurological: He is alert and oriented to person, place, and time.  Skin: Skin is warm and dry.  Psychiatric: He has a normal mood and affect. His behavior is normal. Judgment and thought content normal.    ED Course  Procedures (including critical care time) Results for orders placed during the hospital encounter of 12/28/13  CBC WITH DIFFERENTIAL      Result Value Ref Range   WBC 6.6  4.0 - 10.5 K/uL   RBC 5.13  4.22 - 5.81 MIL/uL   Hemoglobin 15.7  13.0 - 17.0 g/dL   HCT 45.7  39.0 - 52.0 %   MCV 89.1  78.0 - 100.0 fL   MCH 30.6  26.0 - 34.0 pg   MCHC 34.4  30.0 - 36.0 g/dL   RDW 12.8  11.5 - 15.5 %   Platelets 233  150 - 400 K/uL   Neutrophils Relative % 52  43 - 77 %   Neutro Abs 3.5  1.7 - 7.7 K/uL   Lymphocytes Relative 34  12 - 46 %   Lymphs Abs 2.3  0.7 - 4.0 K/uL   Monocytes Relative 10  3 - 12 %   Monocytes Absolute 0.6  0.1 - 1.0 K/uL   Eosinophils Relative 3  0 - 5 %   Eosinophils Absolute 0.2  0.0 - 0.7 K/uL   Basophils Relative 1  0 - 1 %   Basophils Absolute 0.0  0.0 - 0.1 K/uL  BASIC METABOLIC PANEL      Result Value Ref Range   Sodium 140  137 - 147 mEq/L   Potassium 4.3  3.7 - 5.3 mEq/L   Chloride 102  96 - 112 mEq/L   CO2 27  19 - 32 mEq/L   Glucose, Bld 87  70 - 99 mg/dL   BUN 10  6 - 23 mg/dL   Creatinine, Ser 0.72  0.50 - 1.35 mg/dL   Calcium 9.3  8.4 - 10.5 mg/dL   GFR calc non Af Amer >90  >90 mL/min   GFR calc Af Amer  >90  >90 mL/min  I-STAT TROPOININ, ED      Result Value Ref Range   Troponin i, poc 0.00  0.00 - 0.08 ng/mL   Comment 3            Dg Chest 2 View  12/28/2013   CLINICAL DATA:  Chest pain.  EXAM: CHEST  2 VIEW  COMPARISON:  10/20/2012  FINDINGS: Changes from CABG surgery are stable. Cardiac silhouette is normal in size and configuration. No mediastinal or hilar masses. No evidence of adenopathy.  There is peribronchial opacity that extends to the lower lobes bilaterally, likely chronic bronchitic change. No lung consolidation or edema. No pleural effusion or pneumothorax.  The bony thorax is demineralized but grossly intact.  IMPRESSION: No active cardiopulmonary disease.   Electronically Signed   By: Lajean Manes M.D.   On: 12/28/2013 12:21     Imaging Review Dg Chest 2 View  12/28/2013   CLINICAL DATA:  Chest pain.  EXAM: CHEST  2 VIEW  COMPARISON:  10/20/2012  FINDINGS: Changes from CABG surgery are stable. Cardiac silhouette is normal in size and configuration. No mediastinal or hilar masses. No evidence of adenopathy.  There is peribronchial opacity that extends to the lower lobes bilaterally, likely chronic bronchitic change. No lung consolidation or edema. No pleural effusion or pneumothorax.  The bony thorax is demineralized but grossly intact.  IMPRESSION: No active cardiopulmonary disease.   Electronically Signed   By: Lajean Manes M.D.   On: 12/28/2013 12:21     EKG Interpretation   Date/Time:  Thursday December 28 2013 13:29:44 EDT Ventricular Rate:  56 PR Interval:  141 QRS Duration: 97 QT Interval:  429 QTC Calculation: 414 R Axis:   56 Text Interpretation:  Sinus rhythm RSR' in V1 or V2, right VCD or RVH No  significant change was found Confirmed by Wyvonnia Dusky  MD, STEPHEN [id number]) on  12/28/2013 1:40:13 PM      MDM   Final diagnoses:  Chest pain  Rib pain    Patient with chest pain times one week. He states the pain is worsened with coughing and sneezing. He thinks  that the symptoms started after a sneezing fit. However, he does have a history of CABG, and I will also check labs and EKG.  Chest pain improved with morphine. I do believe the chest pain to be musculoskeletal, and related to intercostal strain. He reports that the symptoms started after he had a large his earlier in the week. He is now having radiating symptoms, or shortness of breath. Negative troponin, normal EKG. Recommend discharge at this time. Discussed patient with Dr. Wyvonnia Dusky, who agrees with plan.     Montine Circle, PA-C 12/28/13 1539

## 2013-12-28 NOTE — ED Notes (Signed)
Patient transported to X-ray 

## 2014-01-01 ENCOUNTER — Emergency Department (HOSPITAL_COMMUNITY)
Admission: EM | Admit: 2014-01-01 | Discharge: 2014-01-01 | Disposition: A | Discharge status: Home / Self Care | Payer: Medicare Other | Attending: Emergency Medicine | Admitting: Emergency Medicine

## 2014-01-01 ENCOUNTER — Encounter (HOSPITAL_COMMUNITY): Payer: Self-pay | Admitting: Emergency Medicine

## 2014-01-01 DIAGNOSIS — IMO0001 Reserved for inherently not codable concepts without codable children: Secondary | ICD-10-CM | POA: Diagnosis not present

## 2014-01-01 DIAGNOSIS — Z8739 Personal history of other diseases of the musculoskeletal system and connective tissue: Secondary | ICD-10-CM | POA: Insufficient documentation

## 2014-01-01 DIAGNOSIS — Z79899 Other long term (current) drug therapy: Secondary | ICD-10-CM | POA: Diagnosis not present

## 2014-01-01 DIAGNOSIS — Z8719 Personal history of other diseases of the digestive system: Secondary | ICD-10-CM | POA: Diagnosis not present

## 2014-01-01 DIAGNOSIS — G609 Hereditary and idiopathic neuropathy, unspecified: Secondary | ICD-10-CM | POA: Insufficient documentation

## 2014-01-01 DIAGNOSIS — Z7982 Long term (current) use of aspirin: Secondary | ICD-10-CM | POA: Diagnosis not present

## 2014-01-01 DIAGNOSIS — Z87442 Personal history of urinary calculi: Secondary | ICD-10-CM | POA: Insufficient documentation

## 2014-01-01 DIAGNOSIS — F17200 Nicotine dependence, unspecified, uncomplicated: Secondary | ICD-10-CM | POA: Diagnosis not present

## 2014-01-01 DIAGNOSIS — I2510 Atherosclerotic heart disease of native coronary artery without angina pectoris: Secondary | ICD-10-CM | POA: Diagnosis not present

## 2014-01-01 DIAGNOSIS — E785 Hyperlipidemia, unspecified: Secondary | ICD-10-CM | POA: Diagnosis not present

## 2014-01-01 DIAGNOSIS — M546 Pain in thoracic spine: Secondary | ICD-10-CM | POA: Diagnosis not present

## 2014-01-01 DIAGNOSIS — M7918 Myalgia, other site: Secondary | ICD-10-CM

## 2014-01-01 MED ORDER — KETOROLAC TROMETHAMINE 60 MG/2ML IM SOLN
30.0000 mg | Freq: Once | INTRAMUSCULAR | Status: AC
  Administered 2014-01-01: 30 mg via INTRAMUSCULAR
  Filled 2014-01-01: qty 2

## 2014-01-01 MED ORDER — DIAZEPAM 5 MG PO TABS: 10.0000 mg | ORAL_TABLET | Freq: Once | ORAL | Status: DC

## 2014-01-01 MED ORDER — DIAZEPAM 2 MG PO TABS: 2.0000 mg | ORAL_TABLET | ORAL | Status: DC | PRN

## 2014-01-01 MED ORDER — PREDNISONE 20 MG PO TABS
60.0000 mg | ORAL_TABLET | Freq: Once | ORAL | Status: AC
  Administered 2014-01-01: 60 mg via ORAL
  Filled 2014-01-01: qty 3

## 2014-01-01 MED ORDER — DIAZEPAM 5 MG PO TABS
5.0000 mg | ORAL_TABLET | Freq: Once | ORAL | Status: AC
  Administered 2014-01-01: 5 mg via ORAL
  Filled 2014-01-01: qty 1

## 2014-01-01 MED ORDER — PREDNISONE 10 MG PO TABS: 20.0000 mg | ORAL_TABLET | Freq: Every day | ORAL | Status: DC

## 2014-01-01 MED ORDER — OXYCODONE-ACETAMINOPHEN 5-325 MG PO TABS
2.0000 | ORAL_TABLET | Freq: Once | ORAL | Status: AC
  Administered 2014-01-01: 2 via ORAL
  Filled 2014-01-01: qty 2

## 2014-01-01 NOTE — Discharge Instructions (Signed)
Return here for any trouble breathing or severe pain Musculoskeletal Pain Musculoskeletal pain is muscle and boney aches and pains. These pains can occur in any part of the body. Your caregiver may treat you without knowing the cause of the pain. They may treat you if blood or urine tests, X-rays, and other tests were normal.  CAUSES There is often not a definite cause or reason for these pains. These pains may be caused by a type of germ (virus). The discomfort may also come from overuse. Overuse includes working out too hard when your body is not fit. Boney aches also come from weather changes. Bone is sensitive to atmospheric pressure changes. HOME CARE INSTRUCTIONS   Ask when your test results will be ready. Make sure you get your test results.  Only take over-the-counter or prescription medicines for pain, discomfort, or fever as directed by your caregiver. If you were given medications for your condition, do not drive, operate machinery or power tools, or sign legal documents for 24 hours. Do not drink alcohol. Do not take sleeping pills or other medications that may interfere with treatment.  Continue all activities unless the activities cause more pain. When the pain lessens, slowly resume normal activities. Gradually increase the intensity and duration of the activities or exercise.  During periods of severe pain, bed rest may be helpful. Lay or sit in any position that is comfortable.  Putting ice on the injured area.  Put ice in a bag.  Place a towel between your skin and the bag.  Leave the ice on for 15 to 20 minutes, 3 to 4 times a day.  Follow up with your caregiver for continued problems and no reason can be found for the pain. If the pain becomes worse or does not go away, it may be necessary to repeat tests or do additional testing. Your caregiver may need to look further for a possible cause. SEEK IMMEDIATE MEDICAL CARE IF:  You have pain that is getting worse and is not  relieved by medications.  You develop chest pain that is associated with shortness or breath, sweating, feeling sick to your stomach (nauseous), or throw up (vomit).  Your pain becomes localized to the abdomen.  You develop any new symptoms that seem different or that concern you. MAKE SURE YOU:   Understand these instructions.  Will watch your condition.  Will get help right away if you are not doing well or get worse. Document Released: 07/06/2005 Document Revised: 09/28/2011 Document Reviewed: 03/10/2013 [facility] Patient Information 2014 [place].

## 2014-01-01 NOTE — ED Notes (Signed)
Pt arrived to the ED with a complaint of rib pain.  Pt states the pain is located on the left side in his back.  Pt states that he pain moves from different sides of his back.  Pt states that presently the pain has been consistently on the left mid distal back area.  Pt states he is unavble to get comfortable and has been unable to sleep for several days due to the pain

## 2014-01-01 NOTE — ED Provider Notes (Signed)
CSN: [id number]     Arrival date & time 01/01/14  9798 History   First MD Initiated Contact with Patient 01/01/14 0739     Chief Complaint  Patient presents with  . Chest Wall Pain      (Consider location/radiation/quality/duration/timing/severity/associated sxs/prior Treatment) The history is provided by the patient and the spouse.   patient here complaining of persistent left-sided mid back pain. Pain is localized to posterior ribs #8 or 9. Procedure for similar symptoms 4 days ago and workup including a chest x-ray as well as cardiac evaluation which came back normal. Symptoms began a week ago when he had mechanical injury. Denies any anginal type symptoms. No leg pain or swelling. No pleuritic component. Pain is sharp and worse with movement. Denies any dysuria or hematuria. No rash noted to the skin. Has been using Percocet at home without relief.  Past Medical History  Diagnosis Date  . Peripheral neuropathy     From trauma  . B12 deficiency     Borderline  . Arthritis   . Kidney stones   . Hiatal hernia   . CAD (coronary artery disease)     a. s/p CABG 01/2011;  b. ETT Myoview 3/14:  Low risk, inf defect c/w thinning vs prior infarct, no ischemia, EF 59%  . HLD (hyperlipidemia)   . Tobacco abuse   . Leg pain     ABIs 4/14:  R 1.2, L 1.2, TBIs normal   Past Surgical History  Procedure Laterality Date  . Colonoscopy      Neg  . Epidural steroids     Family History  Problem Relation Age of Onset  . Diabetes Father   . Heart attack Father 74   History  Substance Use Topics  . Smoking status: Current Every Day Smoker -- 1.00 packs/day    Types: Cigarettes    Last Attempt to Quit: 01/29/2011  . Smokeless tobacco: Former Systems developer    Types: Chew  . Alcohol Use: No     Comment: No alcohol abuse since February 2010    Review of Systems  All other systems reviewed and are negative.     Allergies  Iodine; Iohexol; and Shrimp  Home Medications   Prior to Admission  medications   Medication Sig Start Date End Date Taking? Authorizing Provider  albuterol (PROVENTIL HFA;VENTOLIN HFA) 108 (90 BASE) MCG/ACT inhaler Inhale 1 to 2 puffs every 4 to 6 hours as needed for cough 10/12/12  Yes Scott T Kathlen Mody, PA-C  aspirin EC 81 MG tablet Take 81 mg by mouth at bedtime.    Yes Historical Provider, MD  metoprolol tartrate (LOPRESSOR) 25 MG tablet Take 12.5 mg by mouth 2 (two) times daily.   Yes Historical Provider, MD  naproxen sodium (ANAPROX) 220 MG tablet Take 440 mg by mouth 2 (two) times daily with a meal.   Yes Historical Provider, MD  nitroGLYCERIN (NITROSTAT) 0.4 MG SL tablet Place 1 tablet (0.4 mg total) under the tongue every 5 (five) minutes as needed for chest pain. 10/12/12  Yes Scott Joylene Draft, PA-C  omeprazole (PRILOSEC) 20 MG capsule Take 20 mg by mouth daily.   Yes Historical Provider, MD  oxyCODONE-acetaminophen (PERCOCET/ROXICET) 5-325 MG per tablet Take 2 tablets by mouth every 6 (six) hours as needed for severe pain. 12/28/13  Yes Montine Circle, PA-C  simvastatin (ZOCOR) 40 MG tablet Take 1 tablet (40 mg total) by mouth at bedtime. 01/18/13  Yes Renella Cunas, MD   BP 126/76  Pulse 71  Temp(Src) 98.4 F (36.9 C) (Oral)  Resp 20  SpO2 98% Physical Exam  Nursing note and vitals reviewed. Constitutional: He is oriented to person, place, and time. He appears well-developed and well-nourished.  Non-toxic appearance. No distress.  HENT:  Head: Normocephalic and atraumatic.  Eyes: Conjunctivae, EOM and lids are normal. Pupils are equal, round, and reactive to light.  Neck: Normal range of motion. Neck supple. No tracheal deviation present. No mass present.  Cardiovascular: Normal rate, regular rhythm and normal heart sounds.  Exam reveals no gallop.   No murmur heard. Pulmonary/Chest: Effort normal and breath sounds normal. No stridor. No respiratory distress. He has no decreased breath sounds. He has no wheezes. He has no rhonchi. He has no rales.    Abdominal: Soft. Normal appearance and bowel sounds are normal. He exhibits no distension. There is no tenderness. There is no rebound and no CVA tenderness.  Musculoskeletal: Normal range of motion. He exhibits no edema and no tenderness.       Arms: Neurological: He is alert and oriented to person, place, and time. He has normal strength. No cranial nerve deficit or sensory deficit. GCS eye subscore is 4. GCS verbal subscore is 5. GCS motor subscore is 6.  Skin: Skin is warm and dry. No abrasion and no rash noted.  Psychiatric: He has a normal mood and affect. His speech is normal and behavior is normal.    ED Course  Procedures (including critical care time) Labs Review Labs Reviewed - No data to display  Imaging Review No results found.   EKG Interpretation None      MDM   Final diagnoses:  None   Patient given medications for pain and feels better. Do not think reimaging this patient has required because of her recent history  A negative x-rays. He is pinpoint tender as noted in his physical exam. No crepitus noted. Is not short of breath. Pain appears to be musculoskeletal. Patient will be given meds here and return precautions.    Leota Jacobsen, MD 01/01/14 [phone]

## 2014-01-02 ENCOUNTER — Encounter: Payer: Self-pay | Admitting: Internal Medicine

## 2014-01-02 ENCOUNTER — Ambulatory Visit (INDEPENDENT_AMBULATORY_CARE_PROVIDER_SITE_OTHER)
Admission: RE | Admit: 2014-01-02 | Discharge: 2014-01-02 | Disposition: A | Discharge status: Home / Self Care | Payer: Medicare Other | Source: Ambulatory Visit | Attending: Internal Medicine | Admitting: Internal Medicine

## 2014-01-02 ENCOUNTER — Ambulatory Visit (INDEPENDENT_AMBULATORY_CARE_PROVIDER_SITE_OTHER): Payer: Medicare Other | Admitting: Internal Medicine

## 2014-01-02 VITALS — BP 98/70 | HR 64 | Temp 98.1°F | Wt 176.2 lb

## 2014-01-02 DIAGNOSIS — F17200 Nicotine dependence, unspecified, uncomplicated: Secondary | ICD-10-CM

## 2014-01-02 DIAGNOSIS — R0789 Other chest pain: Secondary | ICD-10-CM

## 2014-01-02 DIAGNOSIS — R071 Chest pain on breathing: Secondary | ICD-10-CM

## 2014-01-02 DIAGNOSIS — Z951 Presence of aortocoronary bypass graft: Secondary | ICD-10-CM

## 2014-01-02 DIAGNOSIS — Z720 Tobacco use: Secondary | ICD-10-CM

## 2014-01-02 DIAGNOSIS — S2249XA Multiple fractures of ribs, unspecified side, initial encounter for closed fracture: Secondary | ICD-10-CM | POA: Diagnosis not present

## 2014-01-02 MED ORDER — CYCLOBENZAPRINE HCL 5 MG PO TABS: 5.0000 mg | ORAL_TABLET | Freq: Three times a day (TID) | ORAL | Status: DC | PRN

## 2014-01-02 NOTE — Progress Notes (Signed)
Pre visit review using our clinic review tool, if applicable. No additional management support is needed unless otherwise documented below in the visit note. 

## 2014-01-02 NOTE — Progress Notes (Signed)
   Subjective:    Patient ID: Derrick Stokes, male    DOB: 13-Sep-1950, 63 y.o.   MRN: [id number]  HPI Pt presents today after two recent ER visits on 12/28/13 and 01/01/14 for R sided posterior rib pain. The pt had reported a possible mechanical injury to his posterior thorax on the right.  When the pain did not improve over a week  he presented to the ER on 12/28/13. CXR  revealed chronic bronchitic changes (films reviewed personally) EKG negative for ischemia; IRBBB present (also reviewed) . He was d/c from the ER with oxycodone.   The pt had no improvement on the oxycodone and was having increased discomfort with his "smoker's cough." He returned to the ER on 01/01/14 for the same R sided posterior rib pain. In the ER he was given IM toradol and sent home on valium  & prednisone. Last night the pt seemed to sleep better and be improving. This morning the pt notes he sneezed around 10am and felt an immediate pain in his LEFT posterior ribs that has gradually gotten worse throughout the day. The pain is sharp and constant and exacerbated by any movements of his torso. It radiates around to the anterior aspect of the L thorax.   Pain feels better in a tripod position, though he specifically denies SOB. There is also no pain with deep breathing.   He has a history of rib fractures in different areas following multiple falls.    Review of Systems Constitutional: Negative for fever.  No diaphoresis  Respiratory: Positive for cough. Negative for shortness of breath and wheezing.  Cardiovascular: Negative for palpitations and leg swelling.  L pectoralis radiation from L posterior ribs. No radiation to L arm or jaw  Skin: Negative for rash.  Neurological: Negative for light-headedness and numbness.     Objective:   Physical Exam Extremely strong tobacco odor Localized area pain left posterior thorax at midchest level to direct palpation Overall breath sounds are markedly decreased. He does have  scattered low-grade rales  General appearance:adequate nourishment w/o distress but uncomfortable  Eyes: No conjunctival inflammation or scleral icterus is present.  Oral exam:  lips and gums are healthy appearing.There is  oropharyngeal erythema w/o exudate    Heart:  Normal rate and regular rhythm. S1 and S2 normal without gallop, murmur, click, rub or other extra sounds    Abdomen: bowel sounds normal, soft and non-tender without masses, organomegaly or hernias noted.  No guarding or rebound . No tenderness over the flanks to percussion  Musculoskeletal: Able to lie flat and sit up with some  help. Negative straight leg raising bilaterally. Gait normal  Skin:Warm & dry.  Intact without suspicious lesions or rashes ; no jaundice or tenting  Lymphatic: No lymphadenopathy is noted about the head, neck, axilla               Assessment & Plan:  #1 musculoskeletal chest pain rule out rib fracture. Because he has had bilateral posterior thoracic pain; he may need thoracic spine films & bone scan if no better.  #2 tobacco addiction  #3 coronary disease  Plan: Rib details.  Trial of Flexeril 5 mg 3 times a day prn in place of the Valium. Topical anti-inflammatory cream.  Smoking risk discussed frankly

## 2014-01-02 NOTE — Patient Instructions (Addendum)
   Please take the generic muscle relaxant twice a day and at bedtime in place of the Valium. This can be taken with the narcotic pain medication.   Please think about quitting smoking. Review the risks we discussed. Please call 1-800-QUIT-NOW [phone]) for free smoking cessation counseling.

## 2014-01-02 NOTE — Progress Notes (Signed)
   Subjective:    Patient ID: Derrick Stokes, male    DOB: [birth year], 63 y.o.   MRN: [id number]  HPI Pt presents today after two recent ER visits on 12/28/13 and 01/01/14 for R sided posterior rib pain. The pt had reported a possible mechanical injury to his R sided posterior thorax. He waited one week for the pain to improve. When the pain did not improve he presented to the ER on 12/28/13. At this time he had a CXR that was negative and a negative EKG. He was d/c from the ER with oxycodone.   The pt had no improvement on the oxycodone and was having increased discomfort with his "smoker's cough." He returned to the ER on 01/01/14 for the same R sided posterior rib pain. In the ER he was given IM toradol and sent home on valium prednisone. Last night the pt seemed to sleep better and be improving. This morning the pt notes he sneezed around 10am and felt an immediate pain in his L posterior ribs that has gradually gotten worse throughout the day. The pain is sharp and constant and exacerbated by any movements of his torso. It radiates around to the anterior aspect of his L sided thorax.   Pain feels better in a tripod position, though he specifically denies SOB. There is also no pain with deep breathing.     Review of Systems  Constitutional: Negative for fever.       No diaphoresis  Respiratory: Positive for cough. Negative for shortness of breath and wheezing.   Cardiovascular: Negative for palpitations and leg swelling.       L pectoralis radiation from L posterior ribs. No radiation to L arm or jaw   Skin: Negative for rash.  Neurological: Negative for light-headedness and numbness.       Objective:   Physical Exam Gen: Pt is no acute distress. Alert, appropriate and cooperative throughout exam.  Appears older than stated age   Lungs: Normal respiratory effort; chest expands symmetrically. Lungs are diminished bilaterally with coarse breath sounds. There is no increased WOB.   Heart:  Normal rate and rhythm. Normal S1 and S2. No gallop, click, or rub. No murmur.                              Musculoskeletal/extremities: Mild kyphosis. L hand middle finger partial amputation. Pt has full ROM of UEs bilaterally. Lateral bending equal bilaterally from waist. Mild pain with laying down. Moderate pain upon getting up from supine position. Negative SLR bilaterally  Vascular: Carotid, radial artery, dorsalis pedis and  posterior tibial pulses are full and equal. No bruits present.  Neurologic: Alert and oriented x3. Deep tendon reflexes symmetrical and normal.     Skin: Intact without suspicious lesions or rashes.                                                                           Assessment & Plan:

## 2014-01-03 DIAGNOSIS — Z720 Tobacco use: Secondary | ICD-10-CM | POA: Insufficient documentation

## 2014-01-30 ENCOUNTER — Ambulatory Visit (INDEPENDENT_AMBULATORY_CARE_PROVIDER_SITE_OTHER): Payer: Medicare Other | Admitting: Cardiovascular Disease

## 2014-01-30 ENCOUNTER — Encounter: Payer: Self-pay | Admitting: Cardiovascular Disease

## 2014-01-30 VITALS — BP 107/76 | HR 76 | Ht 68.0 in | Wt 174.0 lb

## 2014-01-30 DIAGNOSIS — Z951 Presence of aortocoronary bypass graft: Secondary | ICD-10-CM | POA: Diagnosis not present

## 2014-01-30 DIAGNOSIS — E785 Hyperlipidemia, unspecified: Secondary | ICD-10-CM | POA: Diagnosis not present

## 2014-01-30 NOTE — Progress Notes (Signed)
HPI:  63 year old gentleman presenting for followup evaluation. The patient has coronary artery disease with previous CABG in 2012. His surgery involved a LIMA to LAD graft, saphenous vein graft to diagonal, saphenous vein graft obtuse marginal, saphenous vein graft sequential to PDA and PLA. He was seen in April of this year with chest pain and a nuclear stress test was performed. This demonstrated inferior thinning versus scar with no evidence of ischemia. It was felt to be a low risk study. The patient is also followed for hypertension and hyperlipidemia. Last lipids from September 2014 showed a cholesterol of 130, triglycerides 165, HDL 31, and LDL 66.   The patient is feeling okay. He has had some pain around both rib cages on the right and left. This is occurred with certain movements with sneezing. He had rib x-rays last month demonstrating rib fractures, but he continues to have some pain. He has had no exertional chest pain or pressure. He denies dyspnea, edema, or heart palpitations.  Outpatient Encounter Prescriptions as of 01/30/2014  Medication Sig  . albuterol (PROVENTIL HFA;VENTOLIN HFA) 108 (90 BASE) MCG/ACT inhaler Inhale 1 to 2 puffs every 4 to 6 hours as needed for cough  . aspirin EC 81 MG tablet Take 81 mg by mouth at bedtime.   . metoprolol tartrate (LOPRESSOR) 25 MG tablet Take 12.5 mg by mouth 2 (two) times daily.  [name] omeprazole (PRILOSEC) 20 MG capsule Take 20 mg by mouth daily.  . simvastatin (ZOCOR) 40 MG tablet Take 1 tablet (40 mg total) by mouth at bedtime.  . [DISCONTINUED] cyclobenzaprine (FLEXERIL) 5 MG tablet Take 1 tablet (5 mg total) by mouth 3 (three) times daily as needed for muscle spasms.  . [DISCONTINUED] diazepam (VALIUM) 2 MG tablet Take 1 tablet (2 mg total) by mouth every 4 (four) hours as needed for muscle spasms.  . [DISCONTINUED] metoprolol tartrate (LOPRESSOR) 25 MG tablet take 1/2 tablet by mouth twice a day  . [DISCONTINUED] naproxen sodium  (ANAPROX) 220 MG tablet Take 440 mg by mouth 2 (two) times daily with a meal.  . [DISCONTINUED] nitroGLYCERIN (NITROSTAT) 0.4 MG SL tablet Place 1 tablet (0.4 mg total) under the tongue every 5 (five) minutes as needed for chest pain.  . [DISCONTINUED] oxyCODONE-acetaminophen (PERCOCET/ROXICET) 5-325 MG per tablet Take 1-2 tablets by mouth every 6 (six) hours as needed for severe pain.  . [DISCONTINUED] oxyCODONE-acetaminophen (PERCOCET/ROXICET) 5-325 MG per tablet Take 2 tablets by mouth every 6 (six) hours as needed for severe pain.  . [DISCONTINUED] predniSONE (DELTASONE) 10 MG tablet Take 2 tablets (20 mg total) by mouth daily.    Allergies  Allergen Reactions  . Iodine Nausea And Vomiting    Turns white in face and sweaty  . Iohexol     May have caused nausea and vomiting several years ago   . Shrimp [Shellfish Allergy] Nausea And Vomiting    Turns white in face and sweaty    Past Medical History  Diagnosis Date  . Peripheral neuropathy     From trauma  . B12 deficiency     Borderline  . Arthritis   . Kidney stones   . Hiatal hernia   . CAD (coronary artery disease)     a. s/p CABG 01/2011;  b. ETT Myoview 3/14:  Low risk, inf defect c/w thinning vs prior infarct, no ischemia, EF 59%  . HLD (hyperlipidemia)   . Tobacco abuse   . Leg pain     ABIs 4/14:  R 1.2, L 1.2, TBIs normal    ROS: Negative except as per HPI  BP 107/76  Pulse 76  Ht 5\' 8"  (1.727 m)  Wt 174 lb (78.926 kg)  BMI 26.46 kg/m2  PHYSICAL EXAM: Pt is alert and oriented, NAD HEENT: normal Neck: JVP - normal, carotids 2+= without bruits Lungs: CTA bilaterally CV: RRR without murmur or gallop Abd: soft, NT, Positive BS, no hepatomegaly Ext: no C/C/E, distal pulses intact and equal Skin: warm/dry no rash  ASSESSMENT AND PLAN: 1. CAD status post CABG. The patient is stable without symptoms of angina. He is on aspirin, beta blocker, and a statin drug. I will see him back in one year.  2.  Hyperlipidemia. Most recent lipids reviewed. Will arrange followup blood work at one year intervals. Will continue simvastatin.  3. Tobacco abuse. Discussed the need for tobacco cessation as it relates to his risk of recurrent cardiac events.  Sherren Mocha MD 01/30/2014 2:07 PM

## 2014-01-30 NOTE — Patient Instructions (Signed)
Your physician recommends that you return for a FASTING LIPID and LIVER profile in September--nothing to eat or drink after midnight, lab opens at 7:30 AM  Your physician recommends that you continue on your current medications as directed. Please refer to the Current Medication list given to you today.  Your physician wants you to follow-up in: 1 YEAR with Dr Burt Knack.  You will receive a reminder letter in the mail two months in advance. If you don't receive a letter, please call our office to schedule the follow-up appointment.

## 2014-02-14 ENCOUNTER — Other Ambulatory Visit: Payer: Self-pay

## 2014-02-14 MED ORDER — SIMVASTATIN 40 MG PO TABS: 40.0000 mg | ORAL_TABLET | Freq: Every day | ORAL | Status: DC

## 2014-04-04 ENCOUNTER — Other Ambulatory Visit (INDEPENDENT_AMBULATORY_CARE_PROVIDER_SITE_OTHER): Payer: Medicare Other

## 2014-04-04 DIAGNOSIS — E785 Hyperlipidemia, unspecified: Secondary | ICD-10-CM | POA: Diagnosis not present

## 2014-04-04 DIAGNOSIS — Z951 Presence of aortocoronary bypass graft: Secondary | ICD-10-CM | POA: Diagnosis not present

## 2014-04-04 LAB — HEPATIC FUNCTION PANEL
ALK PHOS: 68 U/L (ref 39–117)
ALT: 17 U/L (ref 0–53)
AST: 17 U/L (ref 0–37)
Albumin: 4 g/dL (ref 3.5–5.2)
BILIRUBIN DIRECT: 0 mg/dL (ref 0.0–0.3)
BILIRUBIN TOTAL: 0.6 mg/dL (ref 0.2–1.2)
TOTAL PROTEIN: 7.2 g/dL (ref 6.0–8.3)

## 2014-04-04 LAB — LIPID PANEL
CHOLESTEROL: 174 mg/dL (ref 0–200)
HDL: 34.3 mg/dL — ABNORMAL LOW (ref >39.00)
LDL Cholesterol: 109 mg/dL — ABNORMAL HIGH (ref 0–99)
NonHDL: 139.7
TRIGLYCERIDES: 152 mg/dL — AB (ref 0.0–149.0)
Total CHOL/HDL Ratio: 5
VLDL: 30.4 mg/dL (ref 0.0–40.0)

## 2014-04-09 ENCOUNTER — Telehealth: Payer: Self-pay | Admitting: Internal Medicine

## 2014-04-09 NOTE — Telephone Encounter (Signed)
Patient is requesting tetanus/TDAP, zostavax, and flu vac.  Please advise.

## 2014-04-09 NOTE — Telephone Encounter (Signed)
OK but I recommend on different days .in case he developed a rash; we would know to which he was allergic for future reference Schedule 2 separate nurse visits

## 2014-04-10 ENCOUNTER — Telehealth: Payer: Self-pay | Admitting: Internal Medicine

## 2014-04-10 NOTE — Telephone Encounter (Signed)
Patient is coming in 10/13 for zostavax just wanted to let you know so we will have it on hand.  Thanks

## 2014-04-10 NOTE — Telephone Encounter (Signed)
Noted. Thanks.

## 2014-04-10 NOTE — Telephone Encounter (Signed)
Need clarification on which injections to put together? Thanks!

## 2014-04-10 NOTE — Telephone Encounter (Signed)
He can have flu  & tetanus same day & come back for  other

## 2014-04-11 ENCOUNTER — Encounter: Payer: Self-pay | Admitting: Gastroenterology

## 2014-04-17 ENCOUNTER — Ambulatory Visit (INDEPENDENT_AMBULATORY_CARE_PROVIDER_SITE_OTHER): Payer: Medicare Other | Admitting: *Deleted

## 2014-04-17 DIAGNOSIS — Z23 Encounter for immunization: Secondary | ICD-10-CM

## 2014-05-01 ENCOUNTER — Ambulatory Visit (INDEPENDENT_AMBULATORY_CARE_PROVIDER_SITE_OTHER): Payer: Medicare Other | Admitting: *Deleted

## 2014-05-01 DIAGNOSIS — Z23 Encounter for immunization: Secondary | ICD-10-CM

## 2014-05-01 MED ORDER — TETANUS-DIPHTH-ACELL PERTUSSIS 5-2.5-18.5 LF-MCG/0.5 IM SUSP: 0.5000 mL | Freq: Once | INTRAMUSCULAR | Status: DC

## 2014-05-09 ENCOUNTER — Ambulatory Visit (INDEPENDENT_AMBULATORY_CARE_PROVIDER_SITE_OTHER)
Admission: RE | Admit: 2014-05-09 | Discharge: 2014-05-09 | Disposition: A | Discharge status: Home / Self Care | Payer: Medicare Other | Source: Ambulatory Visit | Attending: Internal Medicine | Admitting: Internal Medicine

## 2014-05-09 ENCOUNTER — Ambulatory Visit (INDEPENDENT_AMBULATORY_CARE_PROVIDER_SITE_OTHER): Payer: Medicare Other | Admitting: Internal Medicine

## 2014-05-09 ENCOUNTER — Encounter: Payer: Self-pay | Admitting: Internal Medicine

## 2014-05-09 ENCOUNTER — Other Ambulatory Visit: Payer: Self-pay | Admitting: Internal Medicine

## 2014-05-09 VITALS — BP 122/82 | HR 61 | Temp 97.4°F | Resp 13 | Wt 176.2 lb

## 2014-05-09 DIAGNOSIS — M25519 Pain in unspecified shoulder: Secondary | ICD-10-CM | POA: Diagnosis not present

## 2014-05-09 DIAGNOSIS — M546 Pain in thoracic spine: Secondary | ICD-10-CM

## 2014-05-09 DIAGNOSIS — M4850XA Collapsed vertebra, not elsewhere classified, site unspecified, initial encounter for fracture: Secondary | ICD-10-CM | POA: Diagnosis not present

## 2014-05-09 DIAGNOSIS — S2232XS Fracture of one rib, left side, sequela: Secondary | ICD-10-CM

## 2014-05-09 MED ORDER — TRAMADOL HCL 50 MG PO TABS: 50.0000 mg | ORAL_TABLET | Freq: Three times a day (TID) | ORAL | Status: DC | PRN

## 2014-05-09 NOTE — Progress Notes (Signed)
   Subjective:    Patient ID: Derrick Stokes, male    DOB: 12/24/1950, 63 y.o.   MRN: [id number]  HPI  He returns for evaluation of upper back and rib cage pain.  He was seen 6/11 and 6/15 in emergency room for mechanical injury related pain. He did not improve with narcotics  He was seen 6/16 and found to have fractures of the fourth and fifth left ribs. Since that time that pain which was predominant has improved while the interscapular pain has progressed.  With sneezing he will feel a pop with associated pain in the upper back. It is relieved by lying supine   There's been no definite injury except one about 11 years ago when he fell down stairs injuring his lower back.  He has chronic bilateral leg numbness unrelated to this acute issue.     Review of Systems    He denies fever, chills, sweats,or significant weight loss (4# max)  He has no upper extremity numbness, tingling, weakness  He has no bladder or bowel incontinence.  No associated rash or skin lesions in area of the pain.      Objective:   Physical Exam   Pertinent or positive findings include: Very strong tobacco odor in the room. He does have some discomfort in the interscapular area with direct pressure over the crown of his head. He has pain in this area with left lateral neck rotation. The AP diameter of the chest is increased There is a slow S4. Breath sounds overall are somewhat decreased. He is tender to palpation at the left inferior thoracic rib cage. No crepitus can be auscultated in this area. With compression anteriorly and posteriorly or side to side there is only localized chest discomfort. There is a DIP amputation of third left finger.  General appearance :adequately nourished; in no distress. Eyes: No conjunctival inflammation or scleral icterus is present. Oral exam: Dental hygiene is good. Lips and gums are healthy appearing.There is no oropharyngeal erythema or exudate noted.  Heart:   Normal rate and regular rhythm. S1 and S2 normal without gallop, murmur, click, rub or other extra sounds  Lungs:Chest clear to auscultation; no wheezes, rhonchi,rales ,or rubs present.No increased work of breathing.  Abdomen: bowel sounds normal, soft and non-tender without masses, organomegaly or hernias noted.  No guarding or rebound.  Vascular : all pulses equal ; no bruits present. Skin:Warm & dry.  Intact without suspicious lesions or rashes ; no jaundice or tenting Lymphatic: No lymphadenopathy is noted about the head, neck, axilla          Assessment & Plan:  #1 interscapular pain rule out thoracic spine disease due to duration. If the films are negative & symptoms persist bone scan should be considered.  #2 rib cage discomfort in context of history of multiple rib fractures in the past with trauma as well as simply with sneezing. BMD to assess bone  integrity  #3Tobacco abuse ,risk addressed.  See Orders

## 2014-05-09 NOTE — Patient Instructions (Signed)
Use an anti-inflammatory cream such as Aspercreme or Zostrix cream twice a day to the affected area as needed. In lieu of this warm moist compresses or  hot water bottle can be used. Do not apply ice .  Your next office appointment will be determined based upon review of your pending x-rays. Those instructions will be transmitted to you through My Chart   Followup as needed for your acute issue. Please report any significant change in your symptoms.

## 2014-05-09 NOTE — Progress Notes (Signed)
Pre visit review using our clinic review tool, if applicable. No additional management support is needed unless otherwise documented below in the visit note. 

## 2014-05-10 ENCOUNTER — Telehealth: Payer: Self-pay | Admitting: Internal Medicine

## 2014-05-10 NOTE — Telephone Encounter (Signed)
emmi emailed °

## 2014-08-05 ENCOUNTER — Other Ambulatory Visit: Payer: Self-pay | Admitting: Cardiovascular Disease

## 2014-09-16 ENCOUNTER — Inpatient Hospital Stay (HOSPITAL_COMMUNITY)
Admission: EM | Admit: 2014-09-16 | Discharge: 2014-09-21 | DRG: 247 | Disposition: A | Discharge status: Home / Self Care | Payer: Medicare Other | Attending: Internal Medicine | Admitting: Internal Medicine

## 2014-09-16 ENCOUNTER — Emergency Department (HOSPITAL_COMMUNITY): Payer: Medicare Other

## 2014-09-16 ENCOUNTER — Encounter (HOSPITAL_COMMUNITY): Payer: Self-pay | Admitting: Emergency Medicine

## 2014-09-16 DIAGNOSIS — I208 Other forms of angina pectoris: Secondary | ICD-10-CM

## 2014-09-16 DIAGNOSIS — G6289 Other specified polyneuropathies: Secondary | ICD-10-CM | POA: Diagnosis present

## 2014-09-16 DIAGNOSIS — I200 Unstable angina: Secondary | ICD-10-CM | POA: Diagnosis present

## 2014-09-16 DIAGNOSIS — K219 Gastro-esophageal reflux disease without esophagitis: Secondary | ICD-10-CM | POA: Diagnosis present

## 2014-09-16 DIAGNOSIS — Z951 Presence of aortocoronary bypass graft: Secondary | ICD-10-CM

## 2014-09-16 DIAGNOSIS — Z833 Family history of diabetes mellitus: Secondary | ICD-10-CM | POA: Diagnosis not present

## 2014-09-16 DIAGNOSIS — I2510 Atherosclerotic heart disease of native coronary artery without angina pectoris: Secondary | ICD-10-CM | POA: Diagnosis present

## 2014-09-16 DIAGNOSIS — Z79899 Other long term (current) drug therapy: Secondary | ICD-10-CM

## 2014-09-16 DIAGNOSIS — Z91013 Allergy to seafood: Secondary | ICD-10-CM

## 2014-09-16 DIAGNOSIS — Z8249 Family history of ischemic heart disease and other diseases of the circulatory system: Secondary | ICD-10-CM | POA: Diagnosis not present

## 2014-09-16 DIAGNOSIS — F17210 Nicotine dependence, cigarettes, uncomplicated: Secondary | ICD-10-CM | POA: Diagnosis present

## 2014-09-16 DIAGNOSIS — I2584 Coronary atherosclerosis due to calcified coronary lesion: Secondary | ICD-10-CM | POA: Diagnosis present

## 2014-09-16 DIAGNOSIS — I25110 Atherosclerotic heart disease of native coronary artery with unstable angina pectoris: Secondary | ICD-10-CM | POA: Diagnosis not present

## 2014-09-16 DIAGNOSIS — E785 Hyperlipidemia, unspecified: Secondary | ICD-10-CM | POA: Diagnosis present

## 2014-09-16 DIAGNOSIS — I2582 Chronic total occlusion of coronary artery: Secondary | ICD-10-CM | POA: Diagnosis present

## 2014-09-16 DIAGNOSIS — R0789 Other chest pain: Secondary | ICD-10-CM | POA: Diagnosis not present

## 2014-09-16 DIAGNOSIS — Z7982 Long term (current) use of aspirin: Secondary | ICD-10-CM | POA: Diagnosis not present

## 2014-09-16 DIAGNOSIS — Z720 Tobacco use: Secondary | ICD-10-CM | POA: Diagnosis present

## 2014-09-16 DIAGNOSIS — R079 Chest pain, unspecified: Secondary | ICD-10-CM | POA: Diagnosis not present

## 2014-09-16 DIAGNOSIS — I25710 Atherosclerosis of autologous vein coronary artery bypass graft(s) with unstable angina pectoris: Secondary | ICD-10-CM | POA: Diagnosis present

## 2014-09-16 DIAGNOSIS — D751 Secondary polycythemia: Secondary | ICD-10-CM | POA: Diagnosis present

## 2014-09-16 DIAGNOSIS — I10 Essential (primary) hypertension: Secondary | ICD-10-CM | POA: Diagnosis present

## 2014-09-16 DIAGNOSIS — Z7902 Long term (current) use of antithrombotics/antiplatelets: Secondary | ICD-10-CM | POA: Diagnosis not present

## 2014-09-16 LAB — CBC WITH DIFFERENTIAL/PLATELET
Basophils Absolute: 0 10*3/uL (ref 0.0–0.1)
Basophils Relative: 1 % (ref 0–1)
EOS ABS: 0.2 10*3/uL (ref 0.0–0.7)
Eosinophils Relative: 3 % (ref 0–5)
HCT: 48.8 % (ref 39.0–52.0)
Hemoglobin: 17.3 g/dL — ABNORMAL HIGH (ref 13.0–17.0)
Lymphocytes Relative: 30 % (ref 12–46)
Lymphs Abs: 2.6 10*3/uL (ref 0.7–4.0)
MCH: 31.2 pg (ref 26.0–34.0)
MCHC: 35.5 g/dL (ref 30.0–36.0)
MCV: 88.1 fL (ref 78.0–100.0)
Monocytes Absolute: 0.5 10*3/uL (ref 0.1–1.0)
Monocytes Relative: 6 % (ref 3–12)
NEUTROS PCT: 60 % (ref 43–77)
Neutro Abs: 5.4 10*3/uL (ref 1.7–7.7)
PLATELETS: 211 10*3/uL (ref 150–400)
RBC: 5.54 MIL/uL (ref 4.22–5.81)
RDW: 13 % (ref 11.5–15.5)
WBC: 8.8 10*3/uL (ref 4.0–10.5)

## 2014-09-16 LAB — I-STAT TROPONIN, ED: Troponin i, poc: 0.01 ng/mL (ref 0.00–0.08)

## 2014-09-16 LAB — BASIC METABOLIC PANEL
Anion gap: 8 (ref 5–15)
BUN: 11 mg/dL (ref 6–23)
CALCIUM: 8.7 mg/dL (ref 8.4–10.5)
CO2: 24 mmol/L (ref 19–32)
Chloride: 104 mmol/L (ref 96–112)
Creatinine, Ser: 0.76 mg/dL (ref 0.50–1.35)
GFR calc Af Amer: >90 mL/min (ref >90)
GFR calc non Af Amer: >90 mL/min (ref >90)
Glucose, Bld: 99 mg/dL (ref 70–99)
POTASSIUM: 4.4 mmol/L (ref 3.5–5.1)
Sodium: 136 mmol/L (ref 135–145)

## 2014-09-16 LAB — TROPONIN I: Troponin I: <0.03 ng/mL (ref <0.031)

## 2014-09-16 LAB — BRAIN NATRIURETIC PEPTIDE: B Natriuretic Peptide: 16.4 pg/mL (ref 0.0–100.0)

## 2014-09-16 LAB — HEPARIN LEVEL (UNFRACTIONATED): Heparin Unfractionated: 0.39 IU/mL (ref 0.30–0.70)

## 2014-09-16 LAB — TSH: TSH: 3.18 u[IU]/mL (ref 0.350–4.500)

## 2014-09-16 MED ORDER — SODIUM CHLORIDE 0.9 % IJ SOLN: 3.0000 mL | Freq: Two times a day (BID) | INTRAMUSCULAR | Status: DC

## 2014-09-16 MED ORDER — SODIUM CHLORIDE 0.9 % IV SOLN: 250.0000 mL | INTRAVENOUS | Status: DC | PRN

## 2014-09-16 MED ORDER — ALBUTEROL SULFATE (2.5 MG/3ML) 0.083% IN NEBU
2.5000 mg | INHALATION_SOLUTION | Freq: Four times a day (QID) | RESPIRATORY_TRACT | Status: DC | PRN
Start: 2014-09-16 — End: 2014-09-21

## 2014-09-16 MED ORDER — SODIUM CHLORIDE 0.9 % IJ SOLN
3.0000 mL | Freq: Two times a day (BID) | INTRAMUSCULAR | Status: DC
  Administered 2014-09-19: 11:00:00 3 mL via INTRAVENOUS

## 2014-09-16 MED ORDER — METOPROLOL TARTRATE 12.5 MG HALF TABLET
12.5000 mg | ORAL_TABLET | Freq: Two times a day (BID) | ORAL | Status: DC
  Administered 2014-09-16 – 2014-09-21 (×8): 12.5 mg via ORAL
  Filled 2014-09-16 (×10): qty 1

## 2014-09-16 MED ORDER — HEPARIN BOLUS VIA INFUSION
4000.0000 [IU] | Freq: Once | INTRAVENOUS | Status: AC
  Administered 2014-09-16: 4000 [IU] via INTRAVENOUS
  Filled 2014-09-16: qty 4000

## 2014-09-16 MED ORDER — NITROGLYCERIN 0.4 MG SL SUBL: 0.4000 mg | SUBLINGUAL_TABLET | SUBLINGUAL | Status: DC | PRN

## 2014-09-16 MED ORDER — ACETAMINOPHEN 325 MG PO TABS: 650.0000 mg | ORAL_TABLET | ORAL | Status: DC | PRN

## 2014-09-16 MED ORDER — ONDANSETRON HCL 4 MG/2ML IJ SOLN: 4.0000 mg | Freq: Four times a day (QID) | INTRAMUSCULAR | Status: DC | PRN

## 2014-09-16 MED ORDER — PANTOPRAZOLE SODIUM 40 MG PO TBEC
40.0000 mg | DELAYED_RELEASE_TABLET | Freq: Every day | ORAL | Status: DC
  Administered 2014-09-16 – 2014-09-21 (×6): 40 mg via ORAL
  Filled 2014-09-16 (×7): qty 1

## 2014-09-16 MED ORDER — SODIUM CHLORIDE 0.9 % IJ SOLN: 3.0000 mL | INTRAMUSCULAR | Status: DC | PRN

## 2014-09-16 MED ORDER — ASPIRIN 81 MG PO CHEW
324.0000 mg | CHEWABLE_TABLET | ORAL | Status: AC
  Administered 2014-09-17: 324 mg via ORAL
  Filled 2014-09-16 (×2): qty 4

## 2014-09-16 MED ORDER — ALPRAZOLAM 0.25 MG PO TABS
0.2500 mg | ORAL_TABLET | Freq: Two times a day (BID) | ORAL | Status: DC | PRN
  Administered 2014-09-20: 08:00:00 0.25 mg via ORAL
  Filled 2014-09-16 (×2): qty 1

## 2014-09-16 MED ORDER — ZOLPIDEM TARTRATE 5 MG PO TABS
5.0000 mg | ORAL_TABLET | Freq: Every evening | ORAL | Status: DC | PRN
  Administered 2014-09-17 – 2014-09-20 (×4): 5 mg via ORAL
  Filled 2014-09-16 (×4): qty 1

## 2014-09-16 MED ORDER — TRAMADOL HCL 50 MG PO TABS
50.0000 mg | ORAL_TABLET | Freq: Three times a day (TID) | ORAL | Status: DC | PRN
  Administered 2014-09-19: 50 mg via ORAL
  Filled 2014-09-16: qty 1

## 2014-09-16 MED ORDER — SODIUM CHLORIDE 0.9 % IV SOLN
INTRAVENOUS | Status: DC
Start: 2014-09-17 — End: 2014-09-18
  Administered 2014-09-17 (×2): via INTRAVENOUS

## 2014-09-16 MED ORDER — HEPARIN (PORCINE) IN NACL 100-0.45 UNIT/ML-% IJ SOLN
1000.0000 [IU]/h | INTRAMUSCULAR | Status: DC
  Administered 2014-09-16 – 2014-09-17 (×2): 1000 [IU]/h via INTRAVENOUS
  Filled 2014-09-16 (×4): qty 250

## 2014-09-16 MED ORDER — SIMVASTATIN 40 MG PO TABS
40.0000 mg | ORAL_TABLET | Freq: Every day | ORAL | Status: DC
  Administered 2014-09-16: 40 mg via ORAL
  Filled 2014-09-16 (×2): qty 1

## 2014-09-16 MED ORDER — ASPIRIN 81 MG PO CHEW
324.0000 mg | CHEWABLE_TABLET | Freq: Once | ORAL | Status: AC
  Administered 2014-09-16: 324 mg via ORAL
  Filled 2014-09-16: qty 4

## 2014-09-16 MED ORDER — ASPIRIN EC 81 MG PO TBEC
81.0000 mg | DELAYED_RELEASE_TABLET | Freq: Every day | ORAL | Status: DC
  Filled 2014-09-16: qty 1

## 2014-09-16 MED ORDER — ALBUTEROL SULFATE HFA 108 (90 BASE) MCG/ACT IN AERS: 1.0000 | INHALATION_SPRAY | Freq: Four times a day (QID) | RESPIRATORY_TRACT | Status: DC | PRN

## 2014-09-16 NOTE — ED Notes (Signed)
Pt c/o left sided CP intermittently x 4 days with some numbness in arms; pt sts hx of CABG in past; pt sts SOB with exertion and pain worse with exertion and movement

## 2014-09-16 NOTE — H&P (Signed)
HPI:  Derrick Stokes is a 64 y.o. male with a hx of ongoing tobacco use, CAD, s/p CABG 7/12  (LIMA-LAD, SVG-diagonal, SVG-OM, SVG-PDA and PL), HTN, GERD, tobacco abuse.Whom we are asked to see for CP.   Last seen in the office in 3/14 c/o CP. Myoview obtained and it was low-risk with a small, moderate intensity, fixed defect in the inferior basal wall consistent with thinning vs small prior infarct; no ischemia. LV Ejection Fraction: 59%. LV Wall Motion: NL LV Function; NL Wall Motion  He states that on Thursday both arms felt numb then went away. On Friday had severe CP all day which finally resolved. Today woke up feeling fine and CP recurred after breakfast so came to ER. ECG and CE ok. Now pain free. Denies exertional CP and was able to split wood last week. He feel sx are very reminiscent of previous angina.   Still smoking 1/2-1 ppd  Review of Systems:     Cardiac Review of Systems: {Y] = yes [ ]  = no  Chest Pain [  y  ]  Resting SOB [   ] Exertional SOB  [ y ]  Orthopnea [  ]   Pedal Edema [   ]    Palpitations [  ] Syncope  [  ]   Presyncope [   ]  General Review of Systems: [Y] = yes [  ]=no Constitional: recent weight change [  ]; anorexia [  ]; fatigue [  ]; nausea [  ]; night sweats [  ]; fever [  ]; or chills [  ];                                                                     Dental: poor dentition[  ];   Eye : blurred vision [  ]; diplopia [   ]; vision changes [  ];  Amaurosis fugax[  ]; Resp: cough [  ];  wheezing[  ];  hemoptysis[  ]; shortness of breath[  ]; paroxysmal nocturnal dyspnea[  ]; dyspnea on exertion[  ]; or orthopnea[  ];  GI:  gallstones[  ], vomiting[  ];  dysphagia[  ]; melena[  ];  hematochezia [  ]; heartburn[  ];   GU: kidney stones [  ]; hematuria[  ];   dysuria [  ];  nocturia[  ];               Skin: rash [  ], swelling[  ];, hair loss[  ];  peripheral edema[  ];  or itching[  ]; Musculosketetal: myalgias[  ];  joint swelling[  ];  joint  erythema[  ];  joint pain[y  ];  back pain[  ];  Heme/Lymph: bruising[  ];  bleeding[  ];  anemia[  ];  Neuro: TIA[  ];  headaches[  ];  stroke[  ];  vertigo[  ];  seizures[  ];   paresthesias[  ];  difficulty walking[  ];  Psych:depression[  ]; anxiety[  ];  Endocrine: diabetes[  ];  thyroid dysfunction[  ];  Other:  Past Medical History  Diagnosis Date  . Peripheral neuropathy     From trauma  . B12 deficiency  Borderline  . Arthritis   . Kidney stones   . Hiatal hernia   . CAD (coronary artery disease)     a. s/p CABG 01/2011;  b. ETT Myoview 3/14:  Low risk, inf defect c/w thinning vs prior infarct, no ischemia, EF 59%  . HLD (hyperlipidemia)   . Tobacco abuse   . Leg pain     ABIs 4/14:  R 1.2, L 1.2, TBIs normal    @[facility] @   Allergies  Allergen Reactions  . Iodine Nausea And Vomiting    Turns white in face and sweaty  . Iohexol     May have caused nausea and vomiting several years ago   . Shrimp [Shellfish Allergy] Nausea And Vomiting    Turns white in face and sweaty    History   Social History  . Marital Status: Married    Spouse Name: N/A  . Number of Children: N/A  . Years of Education: N/A   Occupational History  . [employer]    Social History Main Topics  . Smoking status: Current Every Day Smoker -- 1.00 packs/day    Types: Cigarettes    Last Attempt to Quit: 01/29/2011  . Smokeless tobacco: Former Systems developer    Types: Chew  . Alcohol Use: Yes     Comment: occ  . Drug Use: No  . Sexual Activity: Not on file   Other Topics Concern  . Not on file   Social History Narrative   Married   Gets regular exercise - walks 1/4 mile per day 5 times a week without symptoms    Family History  Problem Relation Age of Onset  . Diabetes Father   . Heart attack Father 87    PHYSICAL EXAM: Filed Vitals:   09/16/14 1330  BP: 108/78  Pulse: 72  Temp:   Resp: 22   General:  Lying in bed. No respiratory difficulty HEENT: normal Neck: supple. no  JVD. Carotids 2+ bilat; no bruits. No lymphadenopathy or thryomegaly appreciated. Cor: PMI nondisplaced. Regular rate & rhythm. No rubs, gallops or murmurs. Lungs: decreased BS throughout  No active wheezing Abdomen: soft, nontender, nondistended. No hepatosplenomegaly. No bruits or masses. Good bowel sounds. Extremities: no cyanosis, rash, edema + clubbimng Neuro: alert & oriented x 3, cranial nerves grossly intact. moves all 4 extremities w/o difficulty. Affect pleasant.  ECG: NSR 80 No ST-T wave abnormalities.    Results for orders placed or performed during the hospital encounter of 09/16/14 (from the past 24 hour(s))  Basic metabolic panel     Status: None   Collection Time: 09/16/14 11:37 AM  Result Value Ref Range   Sodium 136 135 - 145 mmol/L   Potassium 4.4 3.5 - 5.1 mmol/L   Chloride 104 96 - 112 mmol/L   CO2 24 19 - 32 mmol/L   Glucose, Bld 99 70 - 99 mg/dL   BUN 11 6 - 23 mg/dL   Creatinine, Ser 0.76 0.50 - 1.35 mg/dL   Calcium 8.7 8.4 - 10.5 mg/dL   GFR calc non Af Amer >90 >90 mL/min   GFR calc Af Amer >90 >90 mL/min   Anion gap 8 5 - 15  CBC with Differential     Status: Abnormal   Collection Time: 09/16/14 11:37 AM  Result Value Ref Range   WBC 8.8 4.0 - 10.5 K/uL   RBC 5.54 4.22 - 5.81 MIL/uL   Hemoglobin 17.3 (H) 13.0 - 17.0 g/dL   HCT 48.8 39.0 - 52.0 %  MCV 88.1 78.0 - 100.0 fL   MCH 31.2 26.0 - 34.0 pg   MCHC 35.5 30.0 - 36.0 g/dL   RDW 13.0 11.5 - 15.5 %   Platelets 211 150 - 400 K/uL   Neutrophils Relative % 60 43 - 77 %   Neutro Abs 5.4 1.7 - 7.7 K/uL   Lymphocytes Relative 30 12 - 46 %   Lymphs Abs 2.6 0.7 - 4.0 K/uL   Monocytes Relative 6 3 - 12 %   Monocytes Absolute 0.5 0.1 - 1.0 K/uL   Eosinophils Relative 3 0 - 5 %   Eosinophils Absolute 0.2 0.0 - 0.7 K/uL   Basophils Relative 1 0 - 1 %   Basophils Absolute 0.0 0.0 - 0.1 K/uL  BNP (order ONLY if patient complains of dyspnea/SOB AND you have documented it for THIS visit)     Status: None    Collection Time: 09/16/14 11:38 AM  Result Value Ref Range   B Natriuretic Peptide 16.4 0.0 - 100.0 pg/mL  I-stat troponin, ED (not at [facility])     Status: None   Collection Time: 09/16/14 11:48 AM  Result Value Ref Range   Troponin i, poc 0.01 0.00 - 0.08 ng/mL   Comment 3           Dg Chest 2 View  09/16/2014   CLINICAL DATA:  64 year old male with left-sided chest pain and arm numbness since Friday.  EXAM: CHEST  2 VIEW  COMPARISON:  Prior chest x-ray 12/28/2013  FINDINGS: Cardiac and mediastinal contours remain within normal limits. Slight prominence of the central pulmonary arteries. Patient is status post median sternotomy with evidence of prior multivessel CABG. No focal airspace consolidation, pulmonary edema, pleural effusion or pneumothorax. Stable central bronchitic changes. No acute osseous abnormalities.  IMPRESSION: 1. Stable chest x-ray without evidence of acute cardiopulmonary disease.   Electronically Signed   By: Jacqulynn Cadet M.D.   On: 09/16/2014 11:17     ASSESSMENT: 1. CP - possible [place] 2. CAD s/p CABG 7/12     --low risk Myoview 3/14 3. Tobacco use 4. Erythrocytosis  PLAN/DISCUSSION:  His symptoms are reminiscent of previous angina however has had prolonged CP without objective signs of ischemia on ECG or CE. We discussed cath versus stress test. He prefers cath. Will schedule for tomorrow. I have reviewed the risks, indications, and alternatives to angioplasty and stenting with the patient. Risks include but are not limited to bleeding, infection, vascular injury, stroke, myocardial infection, arrhythmia, kidney injury, radiation-related injury in the case of prolonged fluoroscopy use, emergency cardiac surgery, and death. The patient understands the risks of serious complication is low (<8%) and he agrees to proceed.   Treat with heparin, ASA, b-blocker, statin. Counseled on need to stop smoking.   Erythrocytosis and clubbing concerning for hypoxia. Will need  ambulatory sats and overnight oximetry to assess need for home O2.  Daniel Bensimhon,MD 3:10 PM

## 2014-09-16 NOTE — ED Provider Notes (Signed)
CSN: [id number]     Arrival date & time 09/16/14  1029 History   First MD Initiated Contact with Patient 09/16/14 1205     Chief Complaint  Patient presents with  . Chest Pain     (Consider location/radiation/quality/duration/timing/severity/associated sxs/prior Treatment) Patient is a 64 y.o. male presenting with chest pain. The history is provided by the patient.  Chest Pain Pain location:  L chest Pain quality: pressure and sharp   Radiates to: numbness going down both arms. Pain radiates to the back: no   Pain severity:  Moderate Onset quality:  Sudden Duration:  10 minutes Timing:  Intermittent Progression:  Waxing and waning Chronicity:  New Context comment:  States on friday had pain in the left chest that was severe causing numbness in both arms but it resolved and has been intermittent since then.   Relieved by:  None tried Exacerbated by: only thing he can think that makes it worse when he lies on his right side. Ineffective treatments:  Rest Associated symptoms: cough   Associated symptoms: no abdominal pain, no anorexia, no fever, no lower extremity edema, no nausea, no palpitations, no shortness of breath and not vomiting   Associated symptoms comment:  Chronic cough which is better than normal Risk factors: coronary artery disease, high cholesterol, hypertension and smoking   Risk factors: no diabetes mellitus, no immobilization and no surgery   Risk factors comment:  5 vessel CABG 4 years ago   Past Medical History  Diagnosis Date  . Peripheral neuropathy     From trauma  . B12 deficiency     Borderline  . Arthritis   . Kidney stones   . Hiatal hernia   . CAD (coronary artery disease)     a. s/p CABG 01/2011;  b. ETT Myoview 3/14:  Low risk, inf defect c/w thinning vs prior infarct, no ischemia, EF 59%  . HLD (hyperlipidemia)   . Tobacco abuse   . Leg pain     ABIs 4/14:  R 1.2, L 1.2, TBIs normal   Past Surgical History  Procedure Laterality Date  .  Colonoscopy      Neg  . Epidural steroids    . Coronary artery bypass graft     Family History  Problem Relation Age of Onset  . Diabetes Father   . Heart attack Father 61   History  Substance Use Topics  . Smoking status: Current Every Day Smoker -- 1.00 packs/day    Types: Cigarettes    Last Attempt to Quit: 01/29/2011  . Smokeless tobacco: Former Systems developer    Types: Chew  . Alcohol Use: Yes     Comment: occ    Review of Systems  Constitutional: Negative for fever.  Respiratory: Positive for cough. Negative for shortness of breath.   Cardiovascular: Positive for chest pain. Negative for palpitations.  Gastrointestinal: Negative for nausea, vomiting, abdominal pain and anorexia.  All other systems reviewed and are negative.     Allergies  Iodine; Iohexol; and Shrimp  Home Medications   Prior to Admission medications   Medication Sig Start Date End Date Taking? Authorizing Provider  albuterol (PROVENTIL HFA;VENTOLIN HFA) 108 (90 BASE) MCG/ACT inhaler Inhale 1 to 2 puffs every 4 to 6 hours as needed for cough 10/12/12   Liliane Shi, PA-C  aspirin EC 81 MG tablet Take 81 mg by mouth at bedtime.     Historical Provider, MD  metoprolol tartrate (LOPRESSOR) 25 MG tablet Take 12.5 mg by mouth  2 (two) times daily.    Historical Provider, MD  omeprazole (PRILOSEC) 20 MG capsule Take 20 mg by mouth daily.    Historical Provider, MD  simvastatin (ZOCOR) 40 MG tablet take 1 tablet by mouth at bedtime 08/06/14   Blane Ohara, MD  traMADol (ULTRAM) 50 MG tablet Take 1 tablet (50 mg total) by mouth every 8 (eight) hours as needed. 05/09/14   Hendricks Limes, MD   BP 106/72 mmHg  Pulse 72  Temp(Src) 97.7 F (36.5 C) (Oral)  Resp 22  SpO2 96% Physical Exam  Constitutional: He is oriented to person, place, and time. He appears well-developed and well-nourished. No distress.  HENT:  Head: Normocephalic and atraumatic.  Mouth/Throat: Oropharynx is clear and moist.  Eyes:  Conjunctivae and EOM are normal. Pupils are equal, round, and reactive to light.  Neck: Normal range of motion. Neck supple.  Cardiovascular: Normal rate, regular rhythm and intact distal pulses.   No murmur heard. Pulmonary/Chest: Effort normal and breath sounds normal. No respiratory distress. He has no wheezes. He has no rales. He exhibits no tenderness.  Abdominal: Soft. He exhibits no distension. There is no tenderness. There is no rebound and no guarding.  Musculoskeletal: Normal range of motion. He exhibits no edema or tenderness.  Neurological: He is alert and oriented to person, place, and time.  Skin: Skin is warm and dry. No rash noted. No erythema.  Psychiatric: He has a normal mood and affect. His behavior is normal.  Nursing note and vitals reviewed.   ED Course  Procedures (including critical care time) Labs Review Labs Reviewed  CBC WITH DIFFERENTIAL/PLATELET - Abnormal; Notable for the following:    Hemoglobin 17.3 (*)    All other components within normal limits  BASIC METABOLIC PANEL  BRAIN NATRIURETIC PEPTIDE  I-STAT TROPOININ, ED    Imaging Review Dg Chest 2 View  09/16/2014   CLINICAL DATA:  64 year old male with left-sided chest pain and arm numbness since Friday.  EXAM: CHEST  2 VIEW  COMPARISON:  Prior chest x-ray 12/28/2013  FINDINGS: Cardiac and mediastinal contours remain within normal limits. Slight prominence of the central pulmonary arteries. Patient is status post median sternotomy with evidence of prior multivessel CABG. No focal airspace consolidation, pulmonary edema, pleural effusion or pneumothorax. Stable central bronchitic changes. No acute osseous abnormalities.  IMPRESSION: 1. Stable chest x-ray without evidence of acute cardiopulmonary disease.   Electronically Signed   By: Jacqulynn Cadet M.D.   On: 09/16/2014 11:17     EKG Interpretation   Date/Time:  Sunday September 16 2014 10:30:57 EST Ventricular Rate:  80 PR Interval:  128 QRS  Duration: 88 QT Interval:  368 QTC Calculation: 424 R Axis:   37 Text Interpretation:  Normal sinus rhythm Cannot rule out Inferior infarct  , age undetermined , new Nonspecific ST abnormality Confirmed by Maryan Rued   MD, Haylea Schlichting ([id number]) on 09/16/2014 12:14:36 PM      MDM   Final diagnoses:  Angina at rest    Patient with a significant history for 5 vessel CABG approximately 4 years ago who presents today with intermittent left-sided chest pain and bilateral arm numbness. He denies shortness of breath, diaphoresis, nausea or vomiting. Symptoms are not related to eating. Atypically it is worse if he lays on his right side last no more than 10-15 minutes and resolves. He has not tried nitroglycerin with this but does take aspirin daily. He sees Dr. Burt Knack and his last appointment was less than  a year ago. He does not know when his last stress testing was.  Initial EKG shows some nonspecific ST abnormalities in the inferior leads but otherwise normal chest x-ray, negative troponin.  We'll discuss patient with cardiology    Blanchie Dessert, MD 09/16/14 [id number]

## 2014-09-16 NOTE — Progress Notes (Signed)
Attempted to call report to ED; RN unavailable; RN to call floor when able.  Rowe Pavy, RN

## 2014-09-16 NOTE — Progress Notes (Signed)
ANTICOAGULATION CONSULT NOTE - Follow Up Consult  Pharmacy Consult for heparin Indication: chest pain/ACS   Labs:  Recent Labs  09/16/14 1137 09/16/14 1815 09/16/14 2208  HGB 17.3*  --   --   HCT 48.8  --   --   PLT 211  --   --   HEPARINUNFRC  --   --  0.39  CREATININE 0.76  --   --   TROPONINI  --  <0.03  --     Assessment/Plan:  64yo male therapeutic on heparin with initial dosing for CP. Will continue gtt at current rate and confirm stable with am labs.   Wynona Neat, PharmD, BCPS  09/16/2014,10:52 PM

## 2014-09-16 NOTE — Progress Notes (Signed)
ANTICOAGULATION CONSULT NOTE - Initial Consult  Pharmacy Consult for Heparin Indication: chest pain/ACS  Allergies  Allergen Reactions  . Iodine Nausea And Vomiting    Turns white in face and sweaty  . Iohexol     May have caused nausea and vomiting several years ago   . Shrimp [Shellfish Allergy] Nausea And Vomiting    Turns white in face and sweaty    Patient Measurements:    Ht: 68 in Wt: ~80 kg Heparin Dosing Weight: 80 kg  Vital Signs: Temp: 97.7 F (36.5 C) (02/28 1038) Temp Source: Oral (02/28 1038) BP: 108/78 mmHg (02/28 1330) Pulse Rate: 72 (02/28 1330)  Labs:  Recent Labs  09/16/14 1137  HGB 17.3*  HCT 48.8  PLT 211  CREATININE 0.76    CrCl cannot be calculated (Unknown ideal weight.).   Medical History: Past Medical History  Diagnosis Date  . Peripheral neuropathy     From trauma  . B12 deficiency     Borderline  . Arthritis   . Kidney stones   . Hiatal hernia   . CAD (coronary artery disease)     a. s/p CABG 01/2011;  b. ETT Myoview 3/14:  Low risk, inf defect c/w thinning vs prior infarct, no ischemia, EF 59%  . HLD (hyperlipidemia)   . Tobacco abuse   . Leg pain     ABIs 4/14:  R 1.2, L 1.2, TBIs normal    Medications:  See electronic med rec  Assessment: 64 y.o. male presents with CP. To begin heparin for r/o ACS. CBC stable at baseline.   Goal of Therapy:  Heparin level 0.3-0.7 units/ml Monitor platelets by anticoagulation protocol: Yes   Plan:  Heparin IV bolus 4000 units Heparin gtt at 1000 units/hr 6 hr heparin level Daily heparin level and CBC  Sherlon Handing, PharmD, BCPS Clinical pharmacist, pager [phone] 09/16/2014,3:26 PM

## 2014-09-17 DIAGNOSIS — Z720 Tobacco use: Secondary | ICD-10-CM

## 2014-09-17 DIAGNOSIS — E785 Hyperlipidemia, unspecified: Secondary | ICD-10-CM

## 2014-09-17 DIAGNOSIS — I2510 Atherosclerotic heart disease of native coronary artery without angina pectoris: Secondary | ICD-10-CM | POA: Diagnosis present

## 2014-09-17 DIAGNOSIS — I25110 Atherosclerotic heart disease of native coronary artery with unstable angina pectoris: Secondary | ICD-10-CM

## 2014-09-17 LAB — COMPREHENSIVE METABOLIC PANEL
ALT: 18 U/L (ref 0–53)
AST: 16 U/L (ref 0–37)
Albumin: 3.4 g/dL — ABNORMAL LOW (ref 3.5–5.2)
Alkaline Phosphatase: 53 U/L (ref 39–117)
Anion gap: 5 (ref 5–15)
BUN: 10 mg/dL (ref 6–23)
CHLORIDE: 105 mmol/L (ref 96–112)
CO2: 25 mmol/L (ref 19–32)
Calcium: 8.6 mg/dL (ref 8.4–10.5)
Creatinine, Ser: 0.77 mg/dL (ref 0.50–1.35)
GFR calc Af Amer: >90 mL/min (ref >90)
GFR calc non Af Amer: >90 mL/min (ref >90)
Glucose, Bld: 96 mg/dL (ref 70–99)
Potassium: 3.9 mmol/L (ref 3.5–5.1)
SODIUM: 135 mmol/L (ref 135–145)
Total Bilirubin: 0.7 mg/dL (ref 0.3–1.2)
Total Protein: 6.1 g/dL (ref 6.0–8.3)

## 2014-09-17 LAB — CBC
HCT: 46.6 % (ref 39.0–52.0)
Hemoglobin: 16.4 g/dL (ref 13.0–17.0)
MCH: 31.4 pg (ref 26.0–34.0)
MCHC: 35.2 g/dL (ref 30.0–36.0)
MCV: 89.3 fL (ref 78.0–100.0)
Platelets: 203 10*3/uL (ref 150–400)
RBC: 5.22 MIL/uL (ref 4.22–5.81)
RDW: 13 % (ref 11.5–15.5)
WBC: 8.4 10*3/uL (ref 4.0–10.5)

## 2014-09-17 LAB — TROPONIN I: Troponin I: <0.03 ng/mL (ref <0.031)

## 2014-09-17 LAB — LIPID PANEL
CHOLESTEROL: 159 mg/dL (ref 0–200)
HDL: 30 mg/dL — AB (ref >39)
LDL CALC: 100 mg/dL — AB (ref 0–99)
Total CHOL/HDL Ratio: 5.3 RATIO
Triglycerides: 145 mg/dL (ref <150)
VLDL: 29 mg/dL (ref 0–40)

## 2014-09-17 LAB — HEPARIN LEVEL (UNFRACTIONATED): HEPARIN UNFRACTIONATED: 0.47 [IU]/mL (ref 0.30–0.70)

## 2014-09-17 LAB — PROTIME-INR
INR: 1.08 (ref 0.00–1.49)
PROTHROMBIN TIME: 14.1 s (ref 11.6–15.2)

## 2014-09-17 MED ORDER — FAMOTIDINE IN NACL 20-0.9 MG/50ML-% IV SOLN
20.0000 mg | Freq: Once | INTRAVENOUS | Status: AC
  Administered 2014-09-18: 20 mg via INTRAVENOUS
  Filled 2014-09-17: qty 50

## 2014-09-17 MED ORDER — SODIUM CHLORIDE 0.9 % IV SOLN
1.0000 mL/kg/h | INTRAVENOUS | Status: DC
  Administered 2014-09-18 (×2): 1 mL/kg/h via INTRAVENOUS

## 2014-09-17 MED ORDER — SODIUM CHLORIDE 0.9 % IJ SOLN: 3.0000 mL | Freq: Two times a day (BID) | INTRAMUSCULAR | Status: DC

## 2014-09-17 MED ORDER — ASPIRIN 81 MG PO CHEW
81.0000 mg | CHEWABLE_TABLET | ORAL | Status: AC
  Administered 2014-09-18: 81 mg via ORAL
  Filled 2014-09-17: qty 1

## 2014-09-17 MED ORDER — ATORVASTATIN CALCIUM 40 MG PO TABS: 40.0000 mg | ORAL_TABLET | Freq: Every day | ORAL | Status: DC

## 2014-09-17 MED ORDER — DIPHENHYDRAMINE HCL 50 MG/ML IJ SOLN
25.0000 mg | Freq: Once | INTRAMUSCULAR | Status: DC
  Filled 2014-09-17: qty 1

## 2014-09-17 MED ORDER — METHYLPREDNISOLONE SODIUM SUCC 125 MG IJ SOLR
125.0000 mg | Freq: Once | INTRAMUSCULAR | Status: DC
  Filled 2014-09-17: qty 2

## 2014-09-17 MED ORDER — ATORVASTATIN CALCIUM 80 MG PO TABS
80.0000 mg | ORAL_TABLET | Freq: Every day | ORAL | Status: DC
  Administered 2014-09-17 – 2014-09-20 (×4): 80 mg via ORAL
  Filled 2014-09-17 (×5): qty 1

## 2014-09-17 MED ORDER — FAMOTIDINE IN NACL 20-0.9 MG/50ML-% IV SOLN
20.0000 mg | Freq: Once | INTRAVENOUS | Status: DC
  Filled 2014-09-17: qty 50

## 2014-09-17 MED ORDER — SODIUM CHLORIDE 0.9 % IV SOLN: 250.0000 mL | INTRAVENOUS | Status: DC | PRN

## 2014-09-17 MED ORDER — METHYLPREDNISOLONE SODIUM SUCC 125 MG IJ SOLR
125.0000 mg | Freq: Once | INTRAMUSCULAR | Status: AC
  Administered 2014-09-18: 125 mg via INTRAVENOUS
  Filled 2014-09-17: qty 2

## 2014-09-17 MED ORDER — SODIUM CHLORIDE 0.9 % IJ SOLN: 3.0000 mL | INTRAMUSCULAR | Status: DC | PRN

## 2014-09-17 MED ORDER — DIPHENHYDRAMINE HCL 50 MG/ML IJ SOLN
25.0000 mg | Freq: Once | INTRAMUSCULAR | Status: AC
  Administered 2014-09-18: 25 mg via INTRAVENOUS
  Filled 2014-09-17: qty 0.5

## 2014-09-17 NOTE — Progress Notes (Addendum)
PA paged, pt 12 beat vtach on tele. Kathleen Argue S 1:09 PM   PA aware and acknowledged. 1:13 PM

## 2014-09-17 NOTE — Progress Notes (Signed)
Due to emergencies, unable to do pt's cath today.  Will discuss with pt and allow for food.  Plan for lunch time tomorrow.

## 2014-09-17 NOTE — Progress Notes (Signed)
Subjective: No further chest pain  Objective: Vital signs in last 24 hours: Temp:  [97.5 F (36.4 C)-97.7 F (36.5 C)] 97.7 F (36.5 C) (02/29 0430) Pulse Rate:  [62-88] 62 (02/29 0430) Resp:  [14-26] 20 (02/29 0430) BP: (92-128)/(64-95) 101/65 mmHg (02/29 0430) SpO2:  [95 %-99 %] 95 % (02/29 0430) Weight:  [176 lb 9.4 oz (80.1 kg)-179 lb 11.2 oz (81.511 kg)] 176 lb 9.4 oz (80.1 kg) (02/29 0430) Weight change:  Last BM Date: 09/16/14 Intake/Output from previous day: not measured   Intake/Output this shift:    PE: General:Pleasant affect, NAD Skin:Warm and dry, brisk capillary refill HEENT:normocephalic, sclera clear, mucus membranes moist Heart:S1S2 RRR without murmur, gallup, rub or click Lungs:clear without rales, rhonchi, or wheezes XYI:AXKP, non tender, + BS, do not palpate liver spleen or masses Ext:no lower ext edema, 2+ pedal pulses, 2+ radial pulses Neuro:alert and oriented, MAE, follows commands, + facial symmetry Tele:  SR with rare PVC  Lab Results:  Recent Labs  09/16/14 1137 09/17/14 0005  WBC 8.8 8.4  HGB 17.3* 16.4  HCT 48.8 46.6  PLT 211 203   BMET  Recent Labs  09/16/14 1137 09/17/14 0005  NA 136 135  K 4.4 3.9  CL 104 105  CO2 24 25  GLUCOSE 99 96  BUN 11 10  CREATININE 0.76 0.77  CALCIUM 8.7 8.6    Recent Labs  09/16/14 1815 09/17/14 0005  TROPONINI <0.03 <0.03    Lab Results  Component Value Date   CHOL 159 09/17/2014   HDL 30* 09/17/2014   LDLCALC 100* 09/17/2014   TRIG 145 09/17/2014   CHOLHDL 5.3 09/17/2014   Lab Results  Component Value Date   HGBA1C 5.2 01/29/2011     Lab Results  Component Value Date   TSH 3.180 09/16/2014    Hepatic Function Panel  Recent Labs  09/17/14 0005  PROT 6.1  ALBUMIN 3.4*  AST 16  ALT 18  ALKPHOS 53  BILITOT 0.7    Recent Labs  09/17/14 0005  CHOL 159   No results for input(s): PROTIME in the last 72 hours.     Studies/Results: Dg Chest 2  View  09/16/2014   CLINICAL DATA:  64 year old male with left-sided chest pain and arm numbness since Friday.  EXAM: CHEST  2 VIEW  COMPARISON:  Prior chest x-ray 12/28/2013  FINDINGS: Cardiac and mediastinal contours remain within normal limits. Slight prominence of the central pulmonary arteries. Patient is status post median sternotomy with evidence of prior multivessel CABG. No focal airspace consolidation, pulmonary edema, pleural effusion or pneumothorax. Stable central bronchitic changes. No acute osseous abnormalities.  IMPRESSION: 1. Stable chest x-ray without evidence of acute cardiopulmonary disease.   Electronically Signed   By: Jacqulynn Cadet M.D.   On: 09/16/2014 11:17    Medications: I have reviewed the patient's current medications. Scheduled Meds: . [START ON 09/18/2014] aspirin EC  81 mg Oral QHS  . metoprolol tartrate  12.5 mg Oral BID  . pantoprazole  40 mg Oral Daily  . simvastatin  40 mg Oral QHS  . sodium chloride  3 mL Intravenous Q12H  . sodium chloride  3 mL Intravenous Q12H   Continuous Infusions: . sodium chloride 75 mL/hr at 09/17/14 0600  . heparin 1,000 Units/hr (09/16/14 1608)   PRN Meds:.sodium chloride, sodium chloride, acetaminophen, albuterol, ALPRAZolam, nitroGLYCERIN, ondansetron (ZOFRAN) IV, sodium chloride, sodium chloride, traMADol, zolpidem  Assessment/Plan:  64yo. male with a  hx of ongoing tobacco use, CAD, s/p CABG 7/12 (LIMA-LAD, SVG-diagonal, SVG-OM, SVG-PDA and PL), HTN, GERD, tobacco abuse.  09/2012 c/o CP- Myoview obtained and it was low-risk with a small, moderate intensity, fixed defect in the inferior basal wall consistent with thinning vs small prior infarct; no ischemia. LV Ejection Fraction: 59%. LV Wall Motion: NL LV Function; NL Wall Motion. Admitted 09/16/14 with bil arm numbness and then chest pain on Friday that returned.  Sx's similar to previous angina.  Scheduled for cardiac cath.  On IV Heparin, Erythrocytosis and clubbing  concerning for hypoxia. Will need ambulatory sats and overnight oximetry to assess need for home O2.  Principal Problem:   Unstable angina- negative MI, for cardiac cath today. On IV heparin--has shrimp allergy and iodine allergy will need steroids prior to cath- have ordered.  Recheck EKG.   Active Problems:   S/P coronary artery bypass graft x 11-2010, neg myoview 2014.     Hyperlipidemia- LDL 100 with HDL of 30 -on zocor 40 increase to 80 vs. Adding zetia or change to lipitor.   Tobacco abuse- discussed importance of stopping     LOS: 1 day   Time spent with pt. :15inutes. [facility] R  Nurse Practitioner Certified Pager [phone] or after 5pm and on weekends call [phone] 09/17/2014, 7:57 AM

## 2014-09-17 NOTE — Progress Notes (Signed)
Utilization review completed.  

## 2014-09-18 ENCOUNTER — Encounter (HOSPITAL_COMMUNITY)
Admission: EM | Disposition: A | Discharge status: Home / Self Care | Payer: Self-pay | Source: Home / Self Care | Attending: Internal Medicine

## 2014-09-18 HISTORY — PX: LEFT HEART CATHETERIZATION WITH CORONARY/GRAFT ANGIOGRAM: SHX5450

## 2014-09-18 LAB — HEPARIN LEVEL (UNFRACTIONATED): HEPARIN UNFRACTIONATED: 0.35 [IU]/mL (ref 0.30–0.70)

## 2014-09-18 LAB — CBC
HCT: 45.6 % (ref 39.0–52.0)
HEMOGLOBIN: 16 g/dL (ref 13.0–17.0)
MCH: 31.2 pg (ref 26.0–34.0)
MCHC: 35.1 g/dL (ref 30.0–36.0)
MCV: 88.9 fL (ref 78.0–100.0)
Platelets: 204 10*3/uL (ref 150–400)
RBC: 5.13 MIL/uL (ref 4.22–5.81)
RDW: 12.9 % (ref 11.5–15.5)
WBC: 6.2 10*3/uL (ref 4.0–10.5)

## 2014-09-18 LAB — BASIC METABOLIC PANEL
Anion gap: 8 (ref 5–15)
BUN: 9 mg/dL (ref 6–23)
CO2: 27 mmol/L (ref 19–32)
Calcium: 8.5 mg/dL (ref 8.4–10.5)
Chloride: 103 mmol/L (ref 96–112)
Creatinine, Ser: 0.72 mg/dL (ref 0.50–1.35)
GFR calc non Af Amer: >90 mL/min (ref >90)
GLUCOSE: 92 mg/dL (ref 70–99)
POTASSIUM: 3.6 mmol/L (ref 3.5–5.1)
Sodium: 138 mmol/L (ref 135–145)

## 2014-09-18 LAB — POCT ACTIVATED CLOTTING TIME
Activated Clotting Time: 349 seconds
Activated Clotting Time: 294 seconds

## 2014-09-18 SURGERY — LEFT HEART CATHETERIZATION WITH CORONARY/GRAFT ANGIOGRAM
Anesthesia: LOCAL

## 2014-09-18 MED ORDER — TIROFIBAN HCL IV 12.5 MG/250 ML
INTRAVENOUS | Status: AC
  Filled 2014-09-18: qty 250

## 2014-09-18 MED ORDER — HEPARIN SODIUM (PORCINE) 5000 UNIT/ML IJ SOLN
5000.0000 [IU] | Freq: Three times a day (TID) | INTRAMUSCULAR | Status: DC
  Administered 2014-09-19 (×3): 5000 [IU] via SUBCUTANEOUS
  Filled 2014-09-18 (×4): qty 1

## 2014-09-18 MED ORDER — MIDAZOLAM HCL 2 MG/2ML IJ SOLN
INTRAMUSCULAR | Status: AC
  Filled 2014-09-18: qty 2

## 2014-09-18 MED ORDER — TIROFIBAN HCL IV 5 MG/100ML: 0.1500 ug/kg/min | INTRAVENOUS | Status: AC

## 2014-09-18 MED ORDER — ONDANSETRON HCL 4 MG/2ML IJ SOLN: 4.0000 mg | Freq: Four times a day (QID) | INTRAMUSCULAR | Status: DC | PRN

## 2014-09-18 MED ORDER — CLOPIDOGREL BISULFATE 75 MG PO TABS
75.0000 mg | ORAL_TABLET | Freq: Every day | ORAL | Status: DC
  Administered 2014-09-19 – 2014-09-21 (×3): 75 mg via ORAL
  Filled 2014-09-18 (×3): qty 1

## 2014-09-18 MED ORDER — VERAPAMIL HCL 2.5 MG/ML IV SOLN
INTRAVENOUS | Status: AC
  Filled 2014-09-18: qty 2

## 2014-09-18 MED ORDER — FENTANYL CITRATE 0.05 MG/ML IJ SOLN
INTRAMUSCULAR | Status: AC
  Filled 2014-09-18: qty 2

## 2014-09-18 MED ORDER — SODIUM CHLORIDE 0.9 % IV SOLN
1.0000 mL/kg/h | INTRAVENOUS | Status: AC
  Administered 2014-09-18: 1 mL/kg/h via INTRAVENOUS

## 2014-09-18 MED ORDER — CLOPIDOGREL BISULFATE 300 MG PO TABS
ORAL_TABLET | ORAL | Status: AC
  Filled 2014-09-18: qty 2

## 2014-09-18 MED ORDER — HEPARIN SODIUM (PORCINE) 1000 UNIT/ML IJ SOLN
INTRAMUSCULAR | Status: AC
  Filled 2014-09-18: qty 1

## 2014-09-18 MED ORDER — ACETAMINOPHEN 325 MG PO TABS: 650.0000 mg | ORAL_TABLET | ORAL | Status: DC | PRN

## 2014-09-18 MED ORDER — ASPIRIN 81 MG PO CHEW
81.0000 mg | CHEWABLE_TABLET | Freq: Every day | ORAL | Status: DC
  Administered 2014-09-19 – 2014-09-21 (×2): 81 mg via ORAL
  Filled 2014-09-18 (×2): qty 1

## 2014-09-18 NOTE — CV Procedure (Addendum)
PROCEDURE:  Left heart catheterization with selective coronary angiography, PCI Left circumflex/OM2.  INDICATIONS:  Unstable angina  The risks, benefits, and details of the procedure were explained to the patient.  The patient verbalized understanding and wanted to proceed.  Informed written consent was obtained.  PROCEDURE TECHNIQUE:  After Xylocaine anesthesia a 87F slender sheath was placed in the left radial artery with a single anterior needle wall stick.   IV Heparin was given.  Right coronary angiography was done using a Judkins R4 guide catheter.  Left coronary angiography was done using a Judkins L3.5 guide catheter.  Left heart cath was done using a pigtail catheter.  The SVG to diagonal was engaged with a JR4 catheter. The SVG to OM was engaged with an AL-1 catheter. The SVG to RCA was engaged with an AL-1 catheter. The intervention was performed. Please see below for details. A TR band was used for hemostasis.   CONTRAST:  Total of 175 cc.  COMPLICATIONS:  None.    HEMODYNAMICS:  Aortic pressure was 114/70; LV pressure was 115/4; LVEDP 14.  There was no gradient between the left ventricle and aorta.    ANGIOGRAPHIC DATA:   The left main coronary artery is patent with mild disease.  The left anterior descending artery is occluded proximally. The LAD fills from a large, patent LIMA graft. There is not much retrograde flow in the LAD but the antegrade flow reaches the apex.  The LIMA to LAD is widely patent. The SVG to diagonal is widely patent. There is moderate, calcific disease in the lower pole of this large diagonal.  The left circumflex artery is a large vessel. In the mid circumflex, there is an 80% lesion. In the proximal OM 2, there is a calcified 60-70% lesion. The remainder of the AV groove circumflex is small.  The SVG to OM is occluded.  The right coronary artery is a very large, dominant vessel. There is mild disease in the proximal vessel. In the mid vessel,  there is a calcific severe lesion, up to 80%. The distal RCA has some mild atherosclerotic disease. The posterior descending artery is medium sized and widely patent. The posterior lateral artery is large and widely patent.  The SVG to RCA is occluded.  LEFT VENTRICULOGRAM:  Left ventricular angiogram was not done.  LVEDP was 14 mmHg.  PCI NARRATIVE: A CLS 3.5 guiding catheter was used to engage the left main. IV heparin was given. IV tirofiban was started. Plavix was given orally. A pro-water wire was placed across the disease in the circumflex and OM 2. A 2.5 x 12 balloon was used to predilate. A 2.75 x 28 Synergy drug-eluting stent was then deployed. There was an under deployed area in the OM 2. The proximal area was postdilated with a 3.25 x 20 noncompliant balloon. The 3.25 balloon was advanced to the OM 2 and would still not successfully expand the stent even at high pressure. We then took a 3.5 x 12 balloon to post-dilate the OM 2 by itself. At 20 atm, the lesion finally gave. There was a significant step down from the stented area to the native vessel no evidence of dissection.  Initially, we had considered also intervening upon the RCA. There was actually more calcium visible in the RCA. Given this, I was hesitant to just try a balloon/stent technique. He will likely need atherectomy given the amount of calcium noted in the RCA.  IMPRESSIONS:  1. Severe three-vessel  coronary disease as outlined above. Patent LIMA to LAD. Patent SVG to diagonal. Occluded SVG to OM. Occluded SVG to RCA system. 2. Successful PCI to the circumflex and OM 2 with a 2.75 x 28 Synergy drug-eluting stent, postdilated to greater than 3.5 mm in diameter. The lesion was quite calcific and required high pressure for full stent dilatation. 3. Left ventricular systolic function not assessed.  LVEDP 14 mmHg.   RECOMMENDATION:  Continue dual antiplatelet therapy for at least a year. Continue IV tirofiban for a couple of  hours. We'll plan on atherectomy on Thursday.  He'll need aggressive secondary prevention as well.  When we do the atherectomy, will likely use right groin or right radial approach.

## 2014-09-18 NOTE — Progress Notes (Signed)
Aggrastat infusion stopped.

## 2014-09-18 NOTE — Progress Notes (Signed)
SUBJECTIVE:  Had a brief episode of mild chest pressure this am but no pain now  OBJECTIVE:   Vitals:   Filed Vitals:   09/17/14 2120 09/17/14 2219 09/18/14 0500 09/18/14 0536  BP: 96/62 92/62  110/72  Pulse: 65   65  Temp: 97.8 F (36.6 C)   97.6 F (36.4 C)  TempSrc: Oral   Oral  Resp: 18   18  Height:      Weight:   177 lb (80.287 kg)   SpO2: 94%   97%   I&O's:   Intake/Output Summary (Last 24 hours) at 09/18/14 0839 Last data filed at 09/17/14 1928  Gross per 24 hour  Intake    711 ml  Output      0 ml  Net    711 ml   TELEMETRY: Reviewed telemetry pt in NSR:     PHYSICAL EXAM General: Well developed, well nourished, in no acute distress Head: Eyes PERRLA, No xanthomas.   Normal cephalic and atramatic  Lungs:   Clear bilaterally to auscultation and percussion. Heart:   HRRR S1 S2 Pulses are 2+ & equal. Abdomen: Bowel sounds are positive, abdomen soft and non-tender without masses  Extremities:   No clubbing, cyanosis or edema.  DP +1 Neuro: Alert and oriented X 3. Psych:  Good affect, responds appropriately   LABS: Basic Metabolic Panel:  Recent Labs  09/17/14 0005 09/18/14 0452  NA 135 138  K 3.9 3.6  CL 105 103  CO2 25 27  GLUCOSE 96 92  BUN 10 9  CREATININE 0.77 0.72  CALCIUM 8.6 8.5   Liver Function Tests:  Recent Labs  09/17/14 0005  AST 16  ALT 18  ALKPHOS 53  BILITOT 0.7  PROT 6.1  ALBUMIN 3.4*   No results for input(s): LIPASE, AMYLASE in the last 72 hours. CBC:  Recent Labs  09/16/14 1137 09/17/14 0005 09/18/14 0452  WBC 8.8 8.4 6.2  NEUTROABS 5.4  --   --   HGB 17.3* 16.4 16.0  HCT 48.8 46.6 45.6  MCV 88.1 89.3 88.9  PLT 211 203 204   Cardiac Enzymes:  Recent Labs  09/16/14 1815 09/17/14 0005 09/17/14 0705  TROPONINI <0.03 <0.03 <0.03   BNP: Invalid input(s): POCBNP D-Dimer: No results for input(s): DDIMER in the last 72 hours. Hemoglobin A1C: No results for input(s): HGBA1C in the last 72  hours. Fasting Lipid Panel:  Recent Labs  09/17/14 0005  CHOL 159  HDL 30*  LDLCALC 100*  TRIG 145  CHOLHDL 5.3   Thyroid Function Tests:  Recent Labs  09/16/14 1815  TSH 3.180   Anemia Panel: No results for input(s): VITAMINB12, FOLATE, FERRITIN, TIBC, IRON, RETICCTPCT in the last 72 hours. Coag Panel:   Lab Results  Component Value Date   INR 1.08 09/17/2014   INR 1.79* 01/30/2011   INR 0.99 01/29/2011    RADIOLOGY: Dg Chest 2 View  09/16/2014   CLINICAL DATA:  64 year old male with left-sided chest pain and arm numbness since Friday.  EXAM: CHEST  2 VIEW  COMPARISON:  Prior chest x-ray 12/28/2013  FINDINGS: Cardiac and mediastinal contours remain within normal limits. Slight prominence of the central pulmonary arteries. Patient is status post median sternotomy with evidence of prior multivessel CABG. No focal airspace consolidation, pulmonary edema, pleural effusion or pneumothorax. Stable central bronchitic changes. No acute osseous abnormalities.  IMPRESSION: 1. Stable chest x-ray without evidence of acute cardiopulmonary disease.   Electronically Signed   By: Myrle Sheng  Laurence Ferrari M.D.   On: 09/16/2014 11:17    Assessment/Plan: 63yo. male with a hx of ongoing tobacco use, CAD, s/p CABG 7/12 (LIMA-LAD, SVG-diagonal, SVG-OM, SVG-PDA and PL), HTN, GERD, tobacco abuse. 09/2012 c/o CP- Myoview obtained and it was low-risk with a small, moderate intensity, fixed defect in the inferior basal wall consistent with thinning vs small prior infarct; no ischemia. LV Ejection Fraction: 59%. LV Wall Motion: NL LV Function; NL Wall Motion. Admitted 09/16/14 with bil arm numbness and then chest pain on Friday that returned. Sx's similar to previous angina. Scheduled for cardiac cath. On IV Heparin, Erythrocytosis and clubbing concerning for hypoxia. Will need ambulatory sats and overnight oximetry to assess need for home O2.  Principal Problem:  Unstable angina- negative MI, for cardiac  cath today. On IV heparin--has shrimp allergy and iodine allergy will need steroids prior to cath- have ordered.Continue ASA/statin/BB  Active Problems:  S/P coronary artery bypass graft x 11-2010, neg myoview 2014.   Hyperlipidemia- LDL 100 with HDL of 30 -changed to atorvastatin.    Tobacco abuse- discussed importance of stopping     Sueanne Margarita, MD  09/18/2014  8:39 AM

## 2014-09-18 NOTE — H&P (View-Only) (Signed)
SUBJECTIVE:  Had a brief episode of mild chest pressure this am but no pain now  OBJECTIVE:   Vitals:   Filed Vitals:   09/17/14 2120 09/17/14 2219 09/18/14 0500 09/18/14 0536  BP: 96/62 92/62  110/72  Pulse: 65   65  Temp: 97.8 F (36.6 C)   97.6 F (36.4 C)  TempSrc: Oral   Oral  Resp: 18   18  Height:      Weight:   177 lb (80.287 kg)   SpO2: 94%   97%   I&O's:   Intake/Output Summary (Last 24 hours) at 09/18/14 0839 Last data filed at 09/17/14 1928  Gross per 24 hour  Intake    711 ml  Output      0 ml  Net    711 ml   TELEMETRY: Reviewed telemetry pt in NSR:     PHYSICAL EXAM General: Well developed, well nourished, in no acute distress Head: Eyes PERRLA, No xanthomas.   Normal cephalic and atramatic  Lungs:   Clear bilaterally to auscultation and percussion. Heart:   HRRR S1 S2 Pulses are 2+ & equal. Abdomen: Bowel sounds are positive, abdomen soft and non-tender without masses  Extremities:   No clubbing, cyanosis or edema.  DP +1 Neuro: Alert and oriented X 3. Psych:  Good affect, responds appropriately   LABS: Basic Metabolic Panel:  Recent Labs  09/17/14 0005 09/18/14 0452  NA 135 138  K 3.9 3.6  CL 105 103  CO2 25 27  GLUCOSE 96 92  BUN 10 9  CREATININE 0.77 0.72  CALCIUM 8.6 8.5   Liver Function Tests:  Recent Labs  09/17/14 0005  AST 16  ALT 18  ALKPHOS 53  BILITOT 0.7  PROT 6.1  ALBUMIN 3.4*   No results for input(s): LIPASE, AMYLASE in the last 72 hours. CBC:  Recent Labs  09/16/14 1137 09/17/14 0005 09/18/14 0452  WBC 8.8 8.4 6.2  NEUTROABS 5.4  --   --   HGB 17.3* 16.4 16.0  HCT 48.8 46.6 45.6  MCV 88.1 89.3 88.9  PLT 211 203 204   Cardiac Enzymes:  Recent Labs  09/16/14 1815 09/17/14 0005 09/17/14 0705  TROPONINI <0.03 <0.03 <0.03   BNP: Invalid input(s): POCBNP D-Dimer: No results for input(s): DDIMER in the last 72 hours. Hemoglobin A1C: No results for input(s): HGBA1C in the last 72  hours. Fasting Lipid Panel:  Recent Labs  09/17/14 0005  CHOL 159  HDL 30*  LDLCALC 100*  TRIG 145  CHOLHDL 5.3   Thyroid Function Tests:  Recent Labs  09/16/14 1815  TSH 3.180   Anemia Panel: No results for input(s): VITAMINB12, FOLATE, FERRITIN, TIBC, IRON, RETICCTPCT in the last 72 hours. Coag Panel:   Lab Results  Component Value Date   INR 1.08 09/17/2014   INR 1.79* 01/30/2011   INR 0.99 01/29/2011    RADIOLOGY: Dg Chest 2 View  09/16/2014   CLINICAL DATA:  64 year old male with left-sided chest pain and arm numbness since Friday.  EXAM: CHEST  2 VIEW  COMPARISON:  Prior chest x-ray 12/28/2013  FINDINGS: Cardiac and mediastinal contours remain within normal limits. Slight prominence of the central pulmonary arteries. Patient is status post median sternotomy with evidence of prior multivessel CABG. No focal airspace consolidation, pulmonary edema, pleural effusion or pneumothorax. Stable central bronchitic changes. No acute osseous abnormalities.  IMPRESSION: 1. Stable chest x-ray without evidence of acute cardiopulmonary disease.   Electronically Signed   By: Myrle Sheng  Laurence Ferrari M.D.   On: 09/16/2014 11:17    Assessment/Plan: 63yo. male with a hx of ongoing tobacco use, CAD, s/p CABG 7/12 (LIMA-LAD, SVG-diagonal, SVG-OM, SVG-PDA and PL), HTN, GERD, tobacco abuse. 09/2012 c/o CP- Myoview obtained and it was low-risk with a small, moderate intensity, fixed defect in the inferior basal wall consistent with thinning vs small prior infarct; no ischemia. LV Ejection Fraction: 59%. LV Wall Motion: NL LV Function; NL Wall Motion. Admitted 09/16/14 with bil arm numbness and then chest pain on Friday that returned. Sx's similar to previous angina. Scheduled for cardiac cath. On IV Heparin, Erythrocytosis and clubbing concerning for hypoxia. Will need ambulatory sats and overnight oximetry to assess need for home O2.  Principal Problem:  Unstable angina- negative MI, for cardiac  cath today. On IV heparin--has shrimp allergy and iodine allergy will need steroids prior to cath- have ordered.Continue ASA/statin/BB  Active Problems:  S/P coronary artery bypass graft x 11-2010, neg myoview 2014.   Hyperlipidemia- LDL 100 with HDL of 30 -changed to atorvastatin.    Tobacco abuse- discussed importance of stopping     Sueanne Margarita, MD  09/18/2014  8:39 AM

## 2014-09-18 NOTE — Interval H&P Note (Signed)
Cath Lab Visit (complete for each Cath Lab visit)  Clinical Evaluation Leading to the Procedure:   ACS: Yes.    Non-ACS:    Anginal Classification: CCS IV  Anti-ischemic medical therapy: Maximal Therapy (2 or more classes of medications)  Non-Invasive Test Results: No non-invasive testing performed  Prior CABG: Previous CABG   TIMI SCORE  Patient Information:  TIMI Score is 3  UA/NSTEMI and intermediate-risk features (e.g., TIMI score 3?4) for short-term risk of death or nonfatal MI  Revascularization of the presumed culprit artery   A (9)  Indication: 10; Score: 9      History and Physical Interval Note:  09/18/2014 2:32 PM  Derrick Stokes  has presented today for surgery, with the diagnosis of NSTEMI  The various methods of treatment have been discussed with the patient and family. After consideration of risks, benefits and other options for treatment, the patient has consented to  Procedure(s): LEFT HEART CATHETERIZATION WITH CORONARY/GRAFT ANGIOGRAM (N/A) as a surgical intervention .  The patient's history has been reviewed, patient examined, no change in status, stable for surgery.  I have reviewed the patient's chart and labs.  Questions were answered to the patient's satisfaction.     Derrick Stokes S.

## 2014-09-19 ENCOUNTER — Encounter (HOSPITAL_COMMUNITY): Payer: Self-pay | Admitting: Interventional Cardiology

## 2014-09-19 DIAGNOSIS — Z951 Presence of aortocoronary bypass graft: Secondary | ICD-10-CM

## 2014-09-19 LAB — BASIC METABOLIC PANEL
Anion gap: 9 (ref 5–15)
BUN: 12 mg/dL (ref 6–23)
CHLORIDE: 109 mmol/L (ref 96–112)
CO2: 18 mmol/L — ABNORMAL LOW (ref 19–32)
Calcium: 8.6 mg/dL (ref 8.4–10.5)
Creatinine, Ser: 0.73 mg/dL (ref 0.50–1.35)
GFR calc Af Amer: >90 mL/min (ref >90)
GFR calc non Af Amer: >90 mL/min (ref >90)
Glucose, Bld: 119 mg/dL — ABNORMAL HIGH (ref 70–99)
POTASSIUM: 4 mmol/L (ref 3.5–5.1)
Sodium: 136 mmol/L (ref 135–145)

## 2014-09-19 LAB — CBC
HCT: 43.6 % (ref 39.0–52.0)
HEMOGLOBIN: 15.1 g/dL (ref 13.0–17.0)
MCH: 30.6 pg (ref 26.0–34.0)
MCHC: 34.6 g/dL (ref 30.0–36.0)
MCV: 88.3 fL (ref 78.0–100.0)
Platelets: 196 10*3/uL (ref 150–400)
RBC: 4.94 MIL/uL (ref 4.22–5.81)
RDW: 12.6 % (ref 11.5–15.5)
WBC: 10.9 10*3/uL — AB (ref 4.0–10.5)

## 2014-09-19 LAB — PLATELET INHIBITION P2Y12: Platelet Function  P2Y12: 144 [PRU] — ABNORMAL LOW (ref 194–418)

## 2014-09-19 MED ORDER — SODIUM CHLORIDE 0.9 % IJ SOLN
3.0000 mL | Freq: Two times a day (BID) | INTRAMUSCULAR | Status: DC
  Administered 2014-09-19: 11:00:00 3 mL via INTRAVENOUS

## 2014-09-19 MED ORDER — SODIUM CHLORIDE 0.9 % IV SOLN
1.0000 mL/kg/h | INTRAVENOUS | Status: DC
  Administered 2014-09-20: 03:00:00 1 mL/kg/h via INTRAVENOUS

## 2014-09-19 MED ORDER — ASPIRIN 81 MG PO CHEW
81.0000 mg | CHEWABLE_TABLET | ORAL | Status: AC
  Administered 2014-09-20: 81 mg via ORAL
  Filled 2014-09-19: qty 1

## 2014-09-19 MED ORDER — SODIUM CHLORIDE 0.9 % IJ SOLN: 3.0000 mL | INTRAMUSCULAR | Status: DC | PRN

## 2014-09-19 MED ORDER — SODIUM CHLORIDE 0.9 % IV SOLN: 1.0000 mL/kg/h | INTRAVENOUS | Status: DC

## 2014-09-19 MED ORDER — SODIUM CHLORIDE 0.9 % IV SOLN: 250.0000 mL | INTRAVENOUS | Status: DC | PRN

## 2014-09-19 MED ORDER — SODIUM CHLORIDE 0.9 % IJ SOLN: 3.0000 mL | Freq: Two times a day (BID) | INTRAMUSCULAR | Status: DC

## 2014-09-19 NOTE — Progress Notes (Signed)
[id number] Cardiac Rehab Pt has walked in hall independently, denies any cp. Completed stent discharge education with pt and wife. Pt and wife have a lot of questions about diet, exercise and smoking cessation. I gave pt tips for quitting, quit smart class information and coaching contact number. He wants to quit and wants to try cold [place]. He states that he quit for 3 months after OHS surgery. Pt states that he has a lot of difficulty trying to exercise due to leg pain. I gave him suggestions to maybe join [employer]. He has a pool and walk in it in the summer months, but is not able to in the winter time. Pt is not interested in Outpt. CRP, he does not feel he will be able to do the exercise due to the leg pain. He and his wife voice understanding of the information provided. We will continue to follow pt.

## 2014-09-19 NOTE — Progress Notes (Signed)
       Patient Name: Derrick Stokes Date of Encounter: 09/19/2014    SUBJECTIVE:No post procedure problems. Labs are stable.  TELEMETRY:  NSR Filed Vitals:   09/18/14 2100 09/18/14 2327 09/19/14 0100 09/19/14 0518  BP: 103/68 117/62  99/58  Pulse: 74 76  64  Temp:  97.3 F (36.3 C)  97.6 F (36.4 C)  TempSrc:  Oral  Oral  Resp:  18  20  Height:      Weight:   174 lb 2.6 oz (79 kg)   SpO2: 94% 98%  97%    Intake/Output Summary (Last 24 hours) at 09/19/14 0755 Last data filed at 09/18/14 2346  Gross per 24 hour  Intake 930.13 ml  Output    500 ml  Net 430.13 ml   LABS: Basic Metabolic Panel:  Recent Labs  09/18/14 0452 09/19/14 0442  NA 138 136  K 3.6 4.0  CL 103 109  CO2 27 18*  GLUCOSE 92 119*  BUN 9 12  CREATININE 0.72 0.73  CALCIUM 8.5 8.6   CBC:  Recent Labs  09/16/14 1137  09/18/14 0452 09/19/14 0442  WBC 8.8  < > 6.2 10.9*  NEUTROABS 5.4  --   --   --   HGB 17.3*  < > 16.0 15.1  HCT 48.8  < > 45.6 43.6  MCV 88.1  < > 88.9 88.3  PLT 211  < > 204 196  < > = values in this interval not displayed. Cardiac Enzymes:  Recent Labs  09/16/14 1815 09/17/14 0005 09/17/14 0705  TROPONINI <0.03 <0.03 <0.03  Fasting Lipid Panel:  Recent Labs  09/17/14 0005  CHOL 159  HDL 30*  LDLCALC 100*  TRIG 145  CHOLHDL 5.3    Radiology/Studies:  No new data  Physical Exam: Blood pressure 99/58, pulse 64, temperature 97.6 F (36.4 C), temperature source Oral, resp. rate 20, height 5' 7.5" (1.715 m), weight 174 lb 2.6 oz (79 kg), SpO2 97 %. Weight change: -2 lb 13.4 oz (-1.287 kg)  Wt Readings from Last 3 Encounters:  09/19/14 174 lb 2.6 oz (79 kg)  05/09/14 176 lb 4 oz (79.946 kg)  01/30/14 174 lb (78.926 kg)    Left radial with no hematoma.  ASSESSMENT:  1. Multivessel CAD with bypass graft failure and stent Cfx yesterday. 2. Tobacco use 3. Hyperlipidemia  Plan:  1. Orders for PCI on tomorrow written 2. Ambulate  today.  Demetrios Isaacs 09/19/2014, 7:55 AM

## 2014-09-20 ENCOUNTER — Encounter (HOSPITAL_COMMUNITY): Payer: Self-pay | Admitting: Interventional Cardiology

## 2014-09-20 ENCOUNTER — Encounter (HOSPITAL_COMMUNITY)
Admission: EM | Disposition: A | Discharge status: Home / Self Care | Payer: Self-pay | Source: Home / Self Care | Attending: Internal Medicine

## 2014-09-20 HISTORY — PX: PERCUTANEOUS CORONARY ROTOBLATOR INTERVENTION (PCI-R): SHX5484

## 2014-09-20 LAB — CBC
HCT: 42.1 % (ref 39.0–52.0)
HEMOGLOBIN: 14.6 g/dL (ref 13.0–17.0)
MCH: 30.4 pg (ref 26.0–34.0)
MCHC: 34.7 g/dL (ref 30.0–36.0)
MCV: 87.7 fL (ref 78.0–100.0)
Platelets: 177 10*3/uL (ref 150–400)
RBC: 4.8 MIL/uL (ref 4.22–5.81)
RDW: 12.9 % (ref 11.5–15.5)
WBC: 9.7 10*3/uL (ref 4.0–10.5)

## 2014-09-20 LAB — BASIC METABOLIC PANEL
ANION GAP: 8 (ref 5–15)
BUN: 12 mg/dL (ref 6–23)
CALCIUM: 8.2 mg/dL — AB (ref 8.4–10.5)
CHLORIDE: 108 mmol/L (ref 96–112)
CO2: 21 mmol/L (ref 19–32)
Creatinine, Ser: 0.81 mg/dL (ref 0.50–1.35)
GFR calc Af Amer: >90 mL/min (ref >90)
GFR calc non Af Amer: >90 mL/min (ref >90)
Glucose, Bld: 89 mg/dL (ref 70–99)
Potassium: 3.7 mmol/L (ref 3.5–5.1)
Sodium: 137 mmol/L (ref 135–145)

## 2014-09-20 LAB — POCT ACTIVATED CLOTTING TIME
ACTIVATED CLOTTING TIME: 257 s
ACTIVATED CLOTTING TIME: 245 s
ACTIVATED CLOTTING TIME: 251 s
ACTIVATED CLOTTING TIME: 153 s
Activated Clotting Time: 245 seconds

## 2014-09-20 SURGERY — PERCUTANEOUS CORONARY ROTOBLATOR INTERVENTION (PCI-R)
Anesthesia: LOCAL

## 2014-09-20 MED ORDER — DIPHENHYDRAMINE HCL 50 MG/ML IJ SOLN
25.0000 mg | INTRAMUSCULAR | Status: AC
  Administered 2014-09-20: 09:00:00 25 mg via INTRAVENOUS
  Filled 2014-09-20: qty 1

## 2014-09-20 MED ORDER — HEPARIN SODIUM (PORCINE) 1000 UNIT/ML IJ SOLN
INTRAMUSCULAR | Status: AC
  Filled 2014-09-20: qty 1

## 2014-09-20 MED ORDER — MIDAZOLAM HCL 2 MG/2ML IJ SOLN
INTRAMUSCULAR | Status: AC
  Filled 2014-09-20: qty 2

## 2014-09-20 MED ORDER — HEPARIN SODIUM (PORCINE) 5000 UNIT/ML IJ SOLN
5000.0000 [IU] | Freq: Three times a day (TID) | INTRAMUSCULAR | Status: DC
  Filled 2014-09-20: qty 1

## 2014-09-20 MED ORDER — CLOPIDOGREL BISULFATE 75 MG PO TABS: 75.0000 mg | ORAL_TABLET | Freq: Every day | ORAL | Status: DC

## 2014-09-20 MED ORDER — SODIUM CHLORIDE 0.9 % IV SOLN: 1.0000 mL/kg/h | INTRAVENOUS | Status: AC

## 2014-09-20 MED ORDER — ASPIRIN 81 MG PO CHEW: 81.0000 mg | CHEWABLE_TABLET | Freq: Every day | ORAL | Status: DC

## 2014-09-20 MED ORDER — ACETAMINOPHEN 325 MG PO TABS: 650.0000 mg | ORAL_TABLET | ORAL | Status: DC | PRN

## 2014-09-20 MED ORDER — HEPARIN (PORCINE) IN NACL 2-0.9 UNIT/ML-% IJ SOLN
INTRAMUSCULAR | Status: AC
  Filled 2014-09-20: qty 1500

## 2014-09-20 MED ORDER — FAMOTIDINE IN NACL 20-0.9 MG/50ML-% IV SOLN
20.0000 mg | INTRAVENOUS | Status: AC
  Administered 2014-09-20: 20 mg via INTRAVENOUS
  Filled 2014-09-20: qty 50

## 2014-09-20 MED ORDER — LIDOCAINE HCL (PF) 1 % IJ SOLN
INTRAMUSCULAR | Status: AC
  Filled 2014-09-20: qty 30

## 2014-09-20 MED ORDER — METHYLPREDNISOLONE SODIUM SUCC 125 MG IJ SOLR
125.0000 mg | INTRAMUSCULAR | Status: AC
  Administered 2014-09-20: 125 mg via INTRAVENOUS
  Filled 2014-09-20: qty 2

## 2014-09-20 MED ORDER — FENTANYL CITRATE 0.05 MG/ML IJ SOLN
INTRAMUSCULAR | Status: AC
  Filled 2014-09-20: qty 2

## 2014-09-20 MED ORDER — ONDANSETRON HCL 4 MG/2ML IJ SOLN: 4.0000 mg | Freq: Four times a day (QID) | INTRAMUSCULAR | Status: DC | PRN

## 2014-09-20 MED ORDER — NITROGLYCERIN 1 MG/10 ML FOR IR/CATH LAB
INTRA_ARTERIAL | Status: AC
  Filled 2014-09-20: qty 10

## 2014-09-20 NOTE — Interval H&P Note (Signed)
Cath Lab Visit (complete for each Cath Lab visit)  Clinical Evaluation Leading to the Procedure:   ACS: Yes.    Non-ACS:    Anginal Classification: CCS IV  Anti-ischemic medical therapy: Minimal Therapy (1 class of medications)  Non-Invasive Test Results: No non-invasive testing performed  Prior CABG: Previous CABG  TIMI SCORE  Patient Information:  TIMI Score is 3  UA/NSTEMI and intermediate-risk features (e.g., TIMI score 3?4) for short-term risk of death or nonfatal MI  Revascularization of the presumed culprit artery   A (9)  Indication: 10; Score: 9     History and Physical Interval Note:  09/20/2014 9:34 AM  Derrick Stokes  has presented today for surgery, with the diagnosis of cad  The various methods of treatment have been discussed with the patient and family. After consideration of risks, benefits and other options for treatment, the patient has consented to  Procedure(s): PERCUTANEOUS CORONARY ROTOBLATOR INTERVENTION (PCI-R) (N/A) as a surgical intervention .  The patient's history has been reviewed, patient examined, no change in status, stable for surgery.  I have reviewed the patient's chart and labs.  Questions were answered to the patient's satisfaction.    Patient had chest pain at rest last night.  Resolved spontaneously.  Derrick Stokes S.

## 2014-09-20 NOTE — Progress Notes (Signed)
Site area: right groin  Site Prior to Removal:  Level 0  Pressure Applied For 15 MINUTES    Minutes Beginning at 1500  Manual:   Yes.    Patient Status During Pull:  AAO X 4  Post Pull Groin Site:  Level 0  Post Pull Instructions Given:  Yes.    Post Pull Pulses Present:  Yes.    Dressing Applied:  Yes.    Comments:  Tolerated procedure well ( venous  sheath  Removal only, pressure hold  Extended to  Arterial site(perclosed) due to  Oozing.

## 2014-09-20 NOTE — H&P (View-Only) (Signed)
       Patient Name: Derrick Stokes Date of Encounter: 09/19/2014    SUBJECTIVE:No post procedure problems. Labs are stable.  TELEMETRY:  NSR Filed Vitals:   09/18/14 2100 09/18/14 2327 09/19/14 0100 09/19/14 0518  BP: 103/68 117/62  99/58  Pulse: 74 76  64  Temp:  97.3 F (36.3 C)  97.6 F (36.4 C)  TempSrc:  Oral  Oral  Resp:  18  20  Height:      Weight:   174 lb 2.6 oz (79 kg)   SpO2: 94% 98%  97%    Intake/Output Summary (Last 24 hours) at 09/19/14 0755 Last data filed at 09/18/14 2346  Gross per 24 hour  Intake 930.13 ml  Output    500 ml  Net 430.13 ml   LABS: Basic Metabolic Panel:  Recent Labs  09/18/14 0452 09/19/14 0442  NA 138 136  K 3.6 4.0  CL 103 109  CO2 27 18*  GLUCOSE 92 119*  BUN 9 12  CREATININE 0.72 0.73  CALCIUM 8.5 8.6   CBC:  Recent Labs  09/16/14 1137  09/18/14 0452 09/19/14 0442  WBC 8.8  < > 6.2 10.9*  NEUTROABS 5.4  --   --   --   HGB 17.3*  < > 16.0 15.1  HCT 48.8  < > 45.6 43.6  MCV 88.1  < > 88.9 88.3  PLT 211  < > 204 196  < > = values in this interval not displayed. Cardiac Enzymes:  Recent Labs  09/16/14 1815 09/17/14 0005 09/17/14 0705  TROPONINI <0.03 <0.03 <0.03  Fasting Lipid Panel:  Recent Labs  09/17/14 0005  CHOL 159  HDL 30*  LDLCALC 100*  TRIG 145  CHOLHDL 5.3    Radiology/Studies:  No new data  Physical Exam: Blood pressure 99/58, pulse 64, temperature 97.6 F (36.4 C), temperature source Oral, resp. rate 20, height 5' 7.5" (1.715 m), weight 174 lb 2.6 oz (79 kg), SpO2 97 %. Weight change: -2 lb 13.4 oz (-1.287 kg)  Wt Readings from Last 3 Encounters:  09/19/14 174 lb 2.6 oz (79 kg)  05/09/14 176 lb 4 oz (79.946 kg)  01/30/14 174 lb (78.926 kg)    Left radial with no hematoma.  ASSESSMENT:  1. Multivessel CAD with bypass graft failure and stent Cfx yesterday. 2. Tobacco use 3. Hyperlipidemia  Plan:  1. Orders for PCI on tomorrow written 2. Ambulate  today.  Demetrios Isaacs 09/19/2014, 7:55 AM

## 2014-09-20 NOTE — CV Procedure (Addendum)
PROCEDURE:  Selective coronary angiography, Orbital atherectomy and PCI of the RCA.  Temporary pacer placement.  INDICATIONS:  Unstable angina  The risks, benefits, and details of the procedure were explained to the patient.  The patient verbalized understanding and wanted to proceed.  Informed written consent was obtained.  PROCEDURE TECHNIQUE:  After Xylocaine anesthesia, a 7 French sheath was placed into the right common femoral artery using the modified Seldinger technique. After Xylocaine anesthesia a 7Fr sheath was placed in the right radial artery with a single anterior needle wall stick.   Diagnostic left coronary artery angiography was performed using a JL4 catheter. Please see below for findings. A 6 French balloontipped pacing wire was then advanced through the right common femoral vein to the right ventricle. The rate was 50 and the output was set at 5. During testing, casher had been lost at 0.5 mA. IV Heparin was given.  Right coronary angiography was done using a Judkins R4 guide catheter.  The intervention was performed. Please see below for details. A Perclose device was deployed in the right groin for hemostasis.   CONTRAST:  Total of 105 cc.  COMPLICATIONS:  None.      ANGIOGRAPHIC DATA:   The left main coronary artery is patent    The left circumflex artery is patent. The stent which extends from the proximal circumflex into the obtuse marginal is widely patent. There is a significant step down from the stent to the native OM vessel. There does not appear to be any dissection or any flow limitation at this point.  The right coronary artery is a large dominant vessel with a heavily calcified mid vessel lesion, up to 90%.  PCI NARRATIVE:  A 7 [place] AR-1 with side holes guide catheter was used to engage the RCA. Heparin was used for anticoagulation. An ACT was used to check that the heparin was therapeutic. A pro-water wire was placed across the area disease into  the distal RCA. A 3.0 x 15 over-the-wire balloon was used to switch out the pro-water wire for a Viper wire. The CSI atherectomy device was advanced. Multiple passes were performed at low speed. There was an improved angiographic result. The moderate disease in the more distal vessel also improved, even though this was not treated with the device. The 3.0 balloon was then used to predilate. It was inflated to 8 atm but inflation still was restricted. We went back with the CSI device. At high speed, 2 passes were performed in the more distal portion of the lesion. The device was removed. A pro-water was advanced across the disease Of the Viper for additional support. A 3.5 x 24 Synergy drug-eluting stent was then advanced and deployed at high pressure. The proximal portion of the stent was postdilated with a 4.0 x 12 noncompliant balloon. There was an excellent angiographic result. There was no residual stenosis. Intracoronary nitroglycerin was given.  IMPRESSIONS:  1. Patent left main coronary artery. 2. Patent stent in the left circumflex artery and its branches. 3. Severe disease in the mid right coronary artery.  This was successfully treated with orbital atherectomy followed by a 3.5 x 24 Synergy drug-eluting stent, postdilated to greater than 4 mm in diameter.   RECOMMENDATION:  Continue dual antiplatelet therapy for at least a year. Continue aggressive secondary prevention. He'll be watched overnight. Probable discharge home tomorrow.   Follow-up with Dr. Burt Knack.

## 2014-09-20 NOTE — Progress Notes (Signed)
SUBJECTIVE:  Had a little chest pain this am  OBJECTIVE:   Vitals:   Filed Vitals:   09/19/14 2318 09/20/14 0500 09/20/14 0605 09/20/14 0700  BP: 106/71  98/66 120/71  Pulse:   56 60  Temp: 98 F (36.7 C)  97.6 F (36.4 C) 97.7 F (36.5 C)  TempSrc: Oral  Oral Oral  Resp: 18  16 18   Height:      Weight:  176 lb 9.4 oz (80.1 kg)    SpO2: 96%  98% 96%   I&O's:   Intake/Output Summary (Last 24 hours) at 09/20/14 0845 Last data filed at 09/19/14 1330  Gross per 24 hour  Intake    480 ml  Output      0 ml  Net    480 ml   TELEMETRY: Reviewed telemetry pt in NSR:     PHYSICAL EXAM General: Well developed, well nourished, in no acute distress Head: Eyes PERRLA, No xanthomas.   Normal cephalic and atramatic  Lungs:   Clear bilaterally to auscultation and percussion. Heart:   HRRR S1 S2 Pulses are 2+ & equal. Abdomen: Bowel sounds are positive, abdomen soft and non-tender without massesExtremities:   No clubbing, cyanosis or edema.  DP +1 Neuro: Alert and oriented X 3. Psych:  Good affect, responds appropriately   LABS: Basic Metabolic Panel:  Recent Labs  09/19/14 0442 09/20/14 0336  NA 136 137  K 4.0 3.7  CL 109 108  CO2 18* 21  GLUCOSE 119* 89  BUN 12 12  CREATININE 0.73 0.81  CALCIUM 8.6 8.2*   Liver Function Tests: No results for input(s): AST, ALT, ALKPHOS, BILITOT, PROT, ALBUMIN in the last 72 hours. No results for input(s): LIPASE, AMYLASE in the last 72 hours. CBC:  Recent Labs  09/19/14 0442 09/20/14 0336  WBC 10.9* 9.7  HGB 15.1 14.6  HCT 43.6 42.1  MCV 88.3 87.7  PLT 196 177   Cardiac Enzymes: No results for input(s): CKTOTAL, CKMB, CKMBINDEX, TROPONINI in the last 72 hours. BNP: Invalid input(s): POCBNP D-Dimer: No results for input(s): DDIMER in the last 72 hours. Hemoglobin A1C: No results for input(s): HGBA1C in the last 72 hours. Fasting Lipid Panel: No results for input(s): CHOL, HDL, LDLCALC, TRIG, CHOLHDL, LDLDIRECT in  the last 72 hours. Thyroid Function Tests: No results for input(s): TSH, T4TOTAL, T3FREE, THYROIDAB in the last 72 hours.  Invalid input(s): FREET3 Anemia Panel: No results for input(s): VITAMINB12, FOLATE, FERRITIN, TIBC, IRON, RETICCTPCT in the last 72 hours. Coag Panel:   Lab Results  Component Value Date   INR 1.08 09/17/2014   INR 1.79* 01/30/2011   INR 0.99 01/29/2011    RADIOLOGY: Dg Chest 2 View  09/16/2014   CLINICAL DATA:  64 year old male with left-sided chest pain and arm numbness since Friday.  EXAM: CHEST  2 VIEW  COMPARISON:  Prior chest x-ray 12/28/2013  FINDINGS: Cardiac and mediastinal contours remain within normal limits. Slight prominence of the central pulmonary arteries. Patient is status post median sternotomy with evidence of prior multivessel CABG. No focal airspace consolidation, pulmonary edema, pleural effusion or pneumothorax. Stable central bronchitic changes. No acute osseous abnormalities.  IMPRESSION: 1. Stable chest x-ray without evidence of acute cardiopulmonary disease.   Electronically Signed   By: Jacqulynn Cadet M.D.   On: 09/16/2014 11:17    Assessment/Plan: 64yo. male with a hx of ongoing tobacco use, CAD, s/p CABG 7/12 (LIMA-LAD, SVG-diagonal, SVG-OM, SVG-PDA and PL), HTN, GERD, tobacco abuse. 09/2012 c/o  CP- Myoview obtained and it was low-risk with a small, moderate intensity, fixed defect in the inferior basal wall consistent with thinning vs small prior infarct; no ischemia. LV Ejection Fraction: 59%. LV Wall Motion: NL LV Function; NL Wall Motion. Admitted 09/16/14 with bil arm numbness and then chest pain that returned. Sx's similar to previous angina.  On IV Heparin, Erythrocytosis and clubbing concerning for hypoxia. Will need ambulatory sats and overnight oximetry to assess need for home O2.  Principal Problem: Unstable angina- negative MI.  Cath showed severe 3 vessel ASCAD with patent LIMA to LAD, patent SVG to diag, occluded SVG to OM  and occluded SVG to RCA and underwent PCI of the left circ and OM2 with DES.  He is now on for atherectomy of the RCA today . On IV heparin--has shrimp allergy and iodine allergy will need steroids prior to cath- have ordered.Continue ASA/statin/BB  Active Problems:  S/P coronary artery bypass graft x 11-2010, neg myoview 2014.   Hyperlipidemia- LDL 100 with HDL of 30 -changed to atorvastatin. Will need repeat FLP and ALT in 6 weeks.  Tobacco abuse- discussed importance of stopping    Sueanne Margarita, MD  09/20/2014  8:45 AM

## 2014-09-21 ENCOUNTER — Encounter (HOSPITAL_COMMUNITY): Payer: Self-pay | Admitting: Nurse Practitioner

## 2014-09-21 LAB — BASIC METABOLIC PANEL
Anion gap: 4 — ABNORMAL LOW (ref 5–15)
BUN: 13 mg/dL (ref 6–23)
CO2: 24 mmol/L (ref 19–32)
CREATININE: 0.8 mg/dL (ref 0.50–1.35)
Calcium: 8.2 mg/dL — ABNORMAL LOW (ref 8.4–10.5)
Chloride: 109 mmol/L (ref 96–112)
GFR calc Af Amer: >90 mL/min (ref >90)
GFR calc non Af Amer: >90 mL/min (ref >90)
GLUCOSE: 106 mg/dL — AB (ref 70–99)
Potassium: 3.8 mmol/L (ref 3.5–5.1)
Sodium: 137 mmol/L (ref 135–145)

## 2014-09-21 LAB — CBC
HEMATOCRIT: 38.7 % — AB (ref 39.0–52.0)
Hemoglobin: 13.6 g/dL (ref 13.0–17.0)
MCH: 30.4 pg (ref 26.0–34.0)
MCHC: 35.1 g/dL (ref 30.0–36.0)
MCV: 86.6 fL (ref 78.0–100.0)
Platelets: 171 10*3/uL (ref 150–400)
RBC: 4.47 MIL/uL (ref 4.22–5.81)
RDW: 12.8 % (ref 11.5–15.5)
WBC: 12.5 10*3/uL — ABNORMAL HIGH (ref 4.0–10.5)

## 2014-09-21 MED ORDER — PANTOPRAZOLE SODIUM 40 MG PO TBEC: 40.0000 mg | DELAYED_RELEASE_TABLET | Freq: Every day | ORAL | Status: DC

## 2014-09-21 MED ORDER — NITROGLYCERIN 0.4 MG SL SUBL: 0.4000 mg | SUBLINGUAL_TABLET | SUBLINGUAL | Status: DC | PRN

## 2014-09-21 MED ORDER — ATORVASTATIN CALCIUM 80 MG PO TABS: 80.0000 mg | ORAL_TABLET | Freq: Every day | ORAL | Status: DC

## 2014-09-21 MED ORDER — CLOPIDOGREL BISULFATE 75 MG PO TABS: 75.0000 mg | ORAL_TABLET | Freq: Every day | ORAL | Status: DC

## 2014-09-21 NOTE — Care Management Note (Signed)
    Page 1 of 1   09/21/2014     11:57:23 AM CARE MANAGEMENT NOTE 09/21/2014  Patient:  Derrick Stokes, Derrick Stokes   Account Number:  [id number]  Date Initiated:  09/21/2014  Documentation initiated by:  GRAVES-BIGELOW,Stanisha Lorenz  Subjective/Objective Assessment:   Pt admitted for Unstable angina- S/p staged PCI/DES to the LCX/OM and RCA this admission.     Action/Plan:   No needs identified by CM at this time. Plan for d/c home.   Anticipated DC Date:  09/21/2014   Anticipated DC Plan:  [place]  CM consult      Choice offered to / List presented to:             Status of service:  Completed, signed off Medicare Important Message given?  YES (If response is "NO", the following Medicare IM given date fields will be blank) Date Medicare IM given:  09/21/2014 Medicare IM given by:  GRAVES-BIGELOW,Sofiya Ezelle Date Additional Medicare IM given:   Additional Medicare IM given by:    Discharge Disposition:  HOME/SELF CARE  Per UR Regulation:  Reviewed for med. necessity/level of care/duration of stay  If discussed at [place] of Stay Meetings, dates discussed:    Comments:

## 2014-09-21 NOTE — Discharge Instructions (Signed)
**  PLEASE REMEMBER TO BRING ALL OF YOUR MEDICATIONS TO EACH OF YOUR FOLLOW-UP OFFICE VISITS. ° °NO HEAVY LIFTING OR SEXUAL ACTIVITY X 7 DAYS. °NO DRIVING X 3-5 DAYS. °NO SOAKING BATHS, HOT TUBS, POOLS, ETC., X 7 DAYS. ° °Groin Site Care °Refer to this sheet in the next few weeks. These instructions provide you with information on caring for yourself after your procedure. Your caregiver may also give you more specific instructions. Your treatment has been planned according to current medical practices, but problems sometimes occur. Call your caregiver if you have any problems or questions after your procedure. °HOME CARE INSTRUCTIONS °· You may shower 24 hours after the procedure. Remove the bandage (dressing) and gently wash the site with plain soap and water. Gently pat the site dry.  °· Do not apply powder or lotion to the site.  °· Do not sit in a bathtub, swimming pool, or whirlpool for 5 to 7 days.  °· No bending, squatting, or lifting anything over 10 pounds (4.5 kg) as directed by your caregiver.  °· Inspect the site at least twice daily.  °· Do not drive home if you are discharged the same day of the procedure. Have someone else drive you.  ° °What to expect: °· Any bruising will usually fade within 1 to 2 weeks.  °· Blood that collects in the tissue (hematoma) may be painful to the touch. It should usually decrease in size and tenderness within 1 to 2 weeks.  °SEEK IMMEDIATE MEDICAL CARE IF: °· You have unusual pain at the groin site or down the affected leg.  °· You have redness, warmth, swelling, or pain at the groin site.  °· You have drainage (other than a small amount of blood on the dressing).  °· You have chills.  °· You have a fever or persistent symptoms for more than 72 hours.  °· You have a fever and your symptoms suddenly get worse.  °· Your leg becomes pale, cool, tingly, or numb.  °You have heavy bleeding from the site. Hold pressure on the site. . ° °

## 2014-09-21 NOTE — Discharge Summary (Signed)
Discharge Summary   Patient ID: Derrick Stokes,  MRN: [id number], DOB/AGE: 1950-12-03 64 y.o.  Admit date: 09/16/2014 Discharge date: 09/21/2014  Primary Care Provider: [name] Primary Cardiologist: Jerilynn Mages. Burt Knack, MD   Discharge Diagnoses Principal Problem:   Unstable angina  **S/p staged PCI/DES to the LCX/OM and RCA this admission.  Active Problems:   S/P coronary artery bypass graft x 5   CAD (coronary artery disease), native coronary artery   Tobacco abuse   Hyperlipidemia   Allergies Allergies  Allergen Reactions  . Iodine Nausea And Vomiting    Turns white in face and sweaty  . Iohexol     May have caused nausea and vomiting several years ago   . Shrimp [Shellfish Allergy] Nausea And Vomiting    Turns white in face and sweaty   Procedures  Cardiac Catheterization and Percutaneous Coronary Intervention 3.1.2016/3.3.2016  HEMODYNAMICS:  Aortic pressure was 114/70; LV pressure was 115/4; LVEDP 14.  There was no gradient between the left ventricle and aorta.    ANGIOGRAPHIC DATA:   The left main coronary artery is patent with mild disease.  The left anterior descending artery is occluded proximally. The LAD fills from a large, patent LIMA graft. There is not much retrograde flow in the LAD but the antegrade flow reaches the apex.  The LIMA to LAD is widely patent.   The SVG to diagonal is widely patent. There is moderate, calcific disease in the lower pole of this large diagonal.  The left circumflex artery is a large vessel. In the mid circumflex, there is an 80% lesion. In the proximal OM 2, there is a calcified 60-70% lesion. The remainder of the AV groove circumflex is small.   **The LCX/OM was successfully stented using a 2.75 x 28 mm Synergy DES on 09/18/2014.**  The SVG to OM is occluded.  The right coronary artery is a very large, dominant vessel. There is mild disease in the proximal vessel. In the mid vessel, there is a calcific severe lesion, up to  80%. The distal RCA has some mild atherosclerotic disease. The posterior descending artery is medium sized and widely patent. The posterior lateral artery is large and widely patent.   **The native RCA was successfully treated with rotational atherectomy and stenting using a 3.5 x 24 mm Synergy DES on 3.3.2016  The SVG to RCA is occluded.  LEFT VENTRICULOGRAM:  Left ventricular angiogram was not done.  LVEDP was 14 mmHg. _____________   History of Present Illness  64 year old male with a prior history of coronary artery disease status post coronary artery bypass grafting, hypertension, hyperlipidemia, and tobacco abuse. He was in his usual state of health until approximately 2 days prior to admission when he began to experience intermittent chest discomfort and arm numbness. He had recurrent chest discomfort on the morning of February 28 and presented to the [facility] emergency department where troponin was normal and ECG was nonacute. He was admitted for further evaluation.  Hospital Course  Patient ruled out for myocardial infarction. Symptoms were felt to be concerning for angina and therefore decision was made to pursue diagnostic catheterization. This was performed on March 1 revealing occluded vein grafts to the obtuse marginal and right coronary artery. The LIMA to the LAD and vein graft to the diagonal were widely patent. As patient had native left circumflex disease extending into the second obtuse marginal, this area was successfully intervened upon using a 2.75 x 28 mm Synergy drug-eluting stent. It was felt  that disease in the right coronary artery would require rotational atherectomy and therefore decision was made to pursue a staged intervention at a later date. Patient tolerated initial procedure well. Arrangements were made for PCI of the native RCA and this was scheduled for March 3. He did have recurrent chest discomfort on the morning of March 3. He was taken back to the Cath Lab  on March 3 underwent successful rotational atherectomy and drug-eluting stent placement within the right coronary artery. He again tolerated the procedure well and postprocedure has been ambulating without recurrent symptoms or limitations. He will be discharged home today in good condition.  Discharge Vitals Blood pressure 110/61, pulse 73, temperature 97.6 F (36.4 C), temperature source Oral, resp. rate 16, height 5' 7.5" (1.715 m), weight 183 lb 10.3 oz (83.3 kg), SpO2 96 %.  Filed Weights   09/19/14 2036 09/20/14 0500 09/21/14 0405  Weight: 176 lb 9.4 oz (80.1 kg) 176 lb 9.4 oz (80.1 kg) 183 lb 10.3 oz (83.3 kg)    Labs  CBC  Recent Labs  09/20/14 0336 09/21/14 0520  WBC 9.7 12.5*  HGB 14.6 13.6  HCT 42.1 38.7*  MCV 87.7 86.6  PLT 177 983   Basic Metabolic Panel  Recent Labs  09/20/14 0336 09/21/14 0520  NA 137 137  K 3.7 3.8  CL 108 109  CO2 21 24  GLUCOSE 89 106*  BUN 12 13  CREATININE 0.81 0.80  CALCIUM 8.2* 8.2*   Liver Function Tests Lab Results  Component Value Date   ALT 18 09/17/2014   AST 16 09/17/2014   ALKPHOS 53 09/17/2014   BILITOT 0.7 09/17/2014    Cardiac Enzymes Lab Results  Component Value Date   TROPONINI <0.03 09/17/2014    Fasting Lipid Panel Lab Results  Component Value Date   CHOL 159 09/17/2014   HDL 30* 09/17/2014   LDLCALC 100* 09/17/2014   TRIG 145 09/17/2014   CHOLHDL 5.3 09/17/2014   Thyroid Function Tests Lab Results  Component Value Date   TSH 3.180 09/16/2014   Disposition  Pt is being discharged home today in good condition.  Follow-up Plans & Appointments      Follow-up Information    Follow up with Richardson Dopp, PA-C On 10/08/2014.   Specialty:  Physician Assistant   Why:  2:30 PM   Contact information:   [address] [place] [address] [phone]      Discharge Medications    Medication List    STOP taking these medications        omeprazole 20 MG capsule    Commonly known as:  PRILOSEC  Replaced by:  pantoprazole 40 MG tablet     simvastatin 40 MG tablet  Commonly known as:  ZOCOR      TAKE these medications        albuterol 108 (90 BASE) MCG/ACT inhaler  Commonly known as:  PROVENTIL HFA;VENTOLIN HFA  Inhale 1 to 2 puffs every 4 to 6 hours as needed for cough     aspirin EC 81 MG tablet  Take 81 mg by mouth at bedtime.     atorvastatin 80 MG tablet  Commonly known as:  LIPITOR  Take 1 tablet (80 mg total) by mouth daily at 6 PM.     clopidogrel 75 MG tablet  Commonly known as:  PLAVIX  Take 1 tablet (75 mg total) by mouth daily with breakfast.     metoprolol tartrate 25 MG tablet  Commonly known as:  LOPRESSOR  Take 12.5 mg by mouth 2 (two) times daily.     nitroGLYCERIN 0.4 MG SL tablet  Commonly known as:  NITROSTAT  Place 1 tablet (0.4 mg total) under the tongue every 5 (five) minutes x 3 doses as needed for chest pain.     pantoprazole 40 MG tablet  Commonly known as:  PROTONIX  Take 1 tablet (40 mg total) by mouth daily.     traMADol 50 MG tablet  Commonly known as:  ULTRAM  Take 1 tablet (50 mg total) by mouth every 8 (eight) hours as needed.       Outstanding Labs/Studies  Follow-up lipids and LFTs in 6-8 weeks.  Duration of Discharge Encounter   Greater than 30 minutes including physician time.  Signed, Murray Hodgkins NP 09/21/2014, 11:07 AM

## 2014-09-21 NOTE — Progress Notes (Signed)
     SUBJECTIVE: No chest pain or SOB this am.   BP 101/64 mmHg  Pulse 64  Temp(Src) 97.6 F (36.4 C) (Oral)  Resp 16  Ht 5' 7.5" (1.715 m)  Wt 183 lb 10.3 oz (83.3 kg)  BMI 28.32 kg/m2  SpO2 96%  Intake/Output Summary (Last 24 hours) at 09/21/14 0704 Last data filed at 09/20/14 2103  Gross per 24 hour  Intake 1460.98 ml  Output   1000 ml  Net 460.98 ml    PHYSICAL EXAM General: Well developed, well nourished, in no acute distress. Alert and oriented x 3.  Psych:  Good affect, responds appropriately Neck: No JVD. No masses noted.  Lungs: Clear bilaterally with no wheezes or rhonci noted.  Heart: RRR with no murmurs noted. Abdomen: Bowel sounds are present. Soft, non-tender.  Extremities: No lower extremity edema.   LABS: Basic Metabolic Panel:  Recent Labs  09/20/14 0336 09/21/14 0520  NA 137 137  K 3.7 3.8  CL 108 109  CO2 21 24  GLUCOSE 89 106*  BUN 12 13  CREATININE 0.81 0.80  CALCIUM 8.2* 8.2*   CBC:  Recent Labs  09/20/14 0336 09/21/14 0520  WBC 9.7 12.5*  HGB 14.6 13.6  HCT 42.1 38.7*  MCV 87.7 86.6  PLT 177 171   Current Meds: . aspirin  81 mg Oral Daily  . atorvastatin  80 mg Oral q1800  . clopidogrel  75 mg Oral Q breakfast  . heparin  5,000 Units Subcutaneous 3 times per day  . metoprolol tartrate  12.5 mg Oral BID  . pantoprazole  40 mg Oral Daily  . sodium chloride  3 mL Intravenous Q12H    ASSESSMENT AND PLAN:  1. Unstable angina: Now s/p DES placement Circumflex/OM2 on 09/18/14 and DES placement RCA following orbital atherectomy 09/19/14. He is doing well this am. Will continue ASA, Plavix, beta blocker, statin.   2. HLD: continue statin  3. Tobacco abuse: Smoking cessation encouraged.   Dispo: Discharge home today and follow up with Dr. Burt Knack or office APP in 1-2 weeks.    MCALHANY,CHRISTOPHER  3/4/20167:04 AM

## 2014-09-21 NOTE — Progress Notes (Signed)
Pt walking independently. Reviewed/reinforced ed with pt and wife. Good understanding, all questions answered. Doesn't think he can do CRPII due to legs. [place] CES, ACSM 9:29 AM 09/21/2014

## 2014-10-08 ENCOUNTER — Encounter: Payer: Self-pay | Admitting: Physician Assistant

## 2014-10-08 ENCOUNTER — Ambulatory Visit (INDEPENDENT_AMBULATORY_CARE_PROVIDER_SITE_OTHER): Payer: Medicare Other | Admitting: Physician Assistant

## 2014-10-08 VITALS — BP 110/66 | HR 54 | Ht 67.5 in | Wt 183.8 lb

## 2014-10-08 DIAGNOSIS — I2510 Atherosclerotic heart disease of native coronary artery without angina pectoris: Secondary | ICD-10-CM | POA: Diagnosis not present

## 2014-10-08 DIAGNOSIS — Z720 Tobacco use: Secondary | ICD-10-CM

## 2014-10-08 DIAGNOSIS — E785 Hyperlipidemia, unspecified: Secondary | ICD-10-CM

## 2014-10-08 DIAGNOSIS — I208 Other forms of angina pectoris: Secondary | ICD-10-CM | POA: Diagnosis not present

## 2014-10-08 NOTE — Progress Notes (Signed)
Cardiology Office Note   Date:  10/08/2014   ID:  Derrick Stokes, DOB 05/20/1951, MRN [id number]  PCP:  [name], MD  Cardiologist:  Dr. Sherren Mocha     Chief Complaint  Patient presents with  . Coronary Artery Disease    s/p PCI of RCA and LCx  . Hospitalization Follow-up     History of Present Illness: Derrick Stokes is a 64 y.o. male with a hx of CAD s/p CABG, HL, tobacco abuse.  Admitted 2/28-3/4 with [place].  LHC was recommended.  This demonstrated an occluded S-OM and S-RCA.  L-LAD, S-Dx grafts were patent.  There was high grade disease in the mid LCx leading into OM2 and high grade disease in the mid RCA.  He underwent PCI with a DES to the LCx leading into the OM2.  He was brought back for staged intervention 2 days later with orbital atherectomy and DES to the RCA.    He returns for FU.  He has had a few episodes of chest pressure since last week. These episodes last just a few seconds. He's had some numbness in his left wrist. He thought that this was likely related to previous surgery.  His symptoms are similar to his previous chest pain but not quite as bad. He's been walking on a daily basis without exertional chest symptoms. He denies shortness of breath, orthopnea, PND or edema. He denies syncope. He has not taken any nitroglycerin. Overall, his activities limited by chronic back pain and left leg radicular symptoms. He denies dysphagia or odynophagia. He denies indigestion.     Studies/Reports Reviewed Today:  Cardiac cath/staged PCI 09/18/14 LM:  patent LCx: patent. Prox stent widely patent. RCA:  heavily calcified mid vessel lesion, up to 90%. PCI:  orbital atherectomy and 3.5 x 24 Synergy DES to RCA  Cardiac cath/PCI 09/20/14 LM: mild disease.  LAD: occluded proximally. T LCx: Mid 80%, proximal OM2 calcified 60-70% RCA: Mid calcified 80% LIMA-LAD:  widely patent.  SVG-Dx:  widely patent.  SVG-OM:  occluded. SVG-RCA:  occluded. LVEDP was 14  mmHg. PCI:  2.75 x 28 Synergy DES to LCx and OM2  Myoview 10/18/12 Low risk stress nuclear study with a small, moderate intensity, fixed defect in the inferior basal wall consistent with thinning vs small prior infarct; no ischemia.  LV Ejection Fraction: 59%.  LV Wall Motion:  NL LV Function; NL Wall Motion   Past Medical History  Diagnosis Date  . Peripheral neuropathy     From trauma  . B12 deficiency     Borderline  . Arthritis   . Kidney stones   . Hiatal hernia   . CAD (coronary artery disease)     a. s/p CABG 01/2011;  b. ETT Myoview 3/14:  Low risk;  c. 09/2014 Cath/PCI: LM nl, LAD 100p, LCX 17m/OM2 60-70p (2.75x28 Synergy DES), RCA dom, 82m (atherectomy, 3.5x24 Synergy DES), PDA nl, LIMA->LAD nl, VG->Diag nl, VG->OM 100, VG->RCA 100.  [name] HLD (hyperlipidemia)   . Tobacco abuse   . Leg pain     ABIs 4/14:  R 1.2, L 1.2, TBIs normal    Past Surgical History  Procedure Laterality Date  . Colonoscopy      Neg  . Epidural steroids    . Coronary artery bypass graft    . Left heart catheterization with coronary/graft angiogram N/A 09/18/2014    Procedure: LEFT HEART CATHETERIZATION WITH [name];  Surgeon: [name], MD;  Location: [facility] CATH  LAB;  Service: Cardiovascular;  Laterality: N/A;  . Percutaneous coronary rotoblator intervention (pci-r) N/A 09/20/2014    Procedure: PERCUTANEOUS CORONARY ROTOBLATOR INTERVENTION (PCI-R);  Surgeon: [name], MD;  Location: [facility] CATH LAB;  Service: Cardiovascular;  Laterality: N/A;     Current Outpatient Prescriptions  Medication Sig Dispense Refill  . albuterol (PROVENTIL HFA;VENTOLIN HFA) 108 (90 BASE) MCG/ACT inhaler Inhale 1 to 2 puffs every 4 to 6 hours as needed for cough 1 Inhaler 6  . aspirin EC 81 MG tablet Take 81 mg by mouth at bedtime.     [name] atorvastatin (LIPITOR) 80 MG tablet Take 1 tablet (80 mg total) by mouth daily at 6 PM. 30 tablet 6  . clopidogrel (PLAVIX) 75 MG tablet Take 1 tablet (75 mg  total) by mouth daily with breakfast. 30 tablet 6  . metoprolol tartrate (LOPRESSOR) 25 MG tablet Take 12.5 mg by mouth 2 (two) times daily.    . nitroGLYCERIN (NITROSTAT) 0.4 MG SL tablet Place 1 tablet (0.4 mg total) under the tongue every 5 (five) minutes x 3 doses as needed for chest pain. 25 tablet 3  . pantoprazole (PROTONIX) 40 MG tablet Take 1 tablet (40 mg total) by mouth daily. 30 tablet 6   No current facility-administered medications for this visit.    Allergies:   Iodine; Iohexol; and Shrimp    Social History:  The patient  reports that he quit smoking 7 days ago. His smoking use included Cigarettes. He has a 45 pack-year smoking history. He has quit using smokeless tobacco. He reports that he drinks about 2.4 oz of alcohol per week. He reports that he does not use illicit drugs.   Family History:  The patient's family history includes Cancer in his mother; Diabetes in his father; Heart attack (age of onset: 67) in his father.    ROS:   Please see the history of present illness.   Review of Systems  HENT: Positive for headaches.   Eyes: Positive for visual disturbance.  Respiratory: Positive for snoring.   Hematologic/Lymphatic: Bruises/bleeds easily.  Musculoskeletal: Positive for back pain, joint pain and myalgias.  Gastrointestinal: Negative for dysphagia, heartburn, hematochezia, melena, nausea and vomiting.  All other systems reviewed and are negative.    PHYSICAL EXAM: VS:  BP 110/66 mmHg  Pulse 54  Ht 5' 7.5" (1.715 m)  Wt 183 lb 12.8 oz (83.371 kg)  BMI 28.35 kg/m2    Wt Readings from Last 3 Encounters:  10/08/14 183 lb 12.8 oz (83.371 kg)  09/21/14 183 lb 10.3 oz (83.3 kg)  05/09/14 176 lb 4 oz (79.946 kg)     GEN: Well nourished, well developed, in no acute distress HEENT: normal Neck: no JVD, no masses Cardiac:  Normal S1/S2, RRR; no murmur ,  no rubs or gallops, no edema ;  left wrist and right groin without hematoma or bruit  Respiratory:  clear  to auscultation bilaterally, no wheezing, rhonchi or rales. GI: soft, nontender, nondistended, + BS MS: no deformity or atrophy Skin: warm and dry  Neuro:  CNs II-XII intact, Strength and sensation are intact Psych: Normal affect   EKG:  EKG is ordered today.  It demonstrates:   Sinus pain, HR 53, normal axis, no significant change compared to prior tracing   Recent Labs: 09/16/2014: B Natriuretic Peptide 16.4; TSH 3.180 09/17/2014: ALT 18 09/21/2014: BUN 13; Creatinine 0.80; Hemoglobin 13.6; Platelets 171; Potassium 3.8; Sodium 137    Lipid Panel    Component Value Date/Time  CHOL 159 09/17/2014 0005   TRIG 145 09/17/2014 0005   HDL 30* 09/17/2014 0005   CHOLHDL 5.3 09/17/2014 0005   VLDL 29 09/17/2014 0005   LDLCALC 100* 09/17/2014 0005      ASSESSMENT AND PLAN:  Coronary artery disease involving native coronary artery of native heart without angina pectoris Since his most recent admission and 2 vessel PCI, he's had recurrent chest symptoms. However, these are somewhat atypical. There are not like his previous symptoms in severity. He has not taken any nitroglycerin. The symptoms last a few seconds. His ECG is unchanged. Overall, I do not believe he needs further ischemic testing. His cardiac catheterization demonstrated an occluded SVG-OM and SVG-RCA. The native circumflex and native RCA were both treated with PCI. He had patent vein graft to the diagonal and patent LIMA to the LAD. Question if acid reflux may be playing a role given that he is now back on Plavix.    -  Increase Protonix to 40 mg twice a day 2 weeks. He can resume once daily after 2 weeks.    -  Close follow-up in 2 weeks. If his symptoms continue to progress, consider stress testing versus relook cardiac catheterization.     -  He declines formal cardiac rehab.   Hyperlipidemia Continue statin.  Plan: Lipid Profile, Hepatic function panel 6 weeks.   Tobacco abuse  He has quit smoking.   Current medicines  are reviewed at length with the patient today.  The patient does not have concerns regarding medicines.  The following changes have been made:  As above.   Labs/ tests ordered today include:   Orders Placed This Encounter  Procedures  . Lipid Profile  . Hepatic function panel  . EKG 12-Lead    Disposition:   FU with Dr. Sherren Mocha or me 2 weeks.    Signed, Versie Starks, MHS 10/08/2014 2:22 PM    [facility] [address], [place], [place]  [address] Phone: [phone]; Fax: [phone]

## 2014-10-08 NOTE — Patient Instructions (Signed)
INCREASE PROTONIX TO 40 MG TWICE DAILY FOR 2 WEEKS THEN GO BACK TO ONCE A DAY  Your physician recommends that you schedule a follow-up appointment in: 2 WEEKS WITH SCOTT WEAVER, PAC SAME DAY DR. Burt Knack IS IN THE OFFICE IF POSSIBLE  CALL IF YOUR SYMPTOMS GET WORSE [phone]  FASTING LIPID AND LIVER PANEL TO BE DONE IN 6 WEEKS

## 2014-10-19 ENCOUNTER — Emergency Department (HOSPITAL_COMMUNITY)
Admission: EM | Admit: 2014-10-19 | Discharge: 2014-10-19 | Disposition: A | Discharge status: Home / Self Care | Payer: Medicare Other | Attending: Emergency Medicine | Admitting: Emergency Medicine

## 2014-10-19 ENCOUNTER — Encounter (HOSPITAL_COMMUNITY): Payer: Self-pay | Admitting: Emergency Medicine

## 2014-10-19 DIAGNOSIS — Z79899 Other long term (current) drug therapy: Secondary | ICD-10-CM | POA: Insufficient documentation

## 2014-10-19 DIAGNOSIS — M1990 Unspecified osteoarthritis, unspecified site: Secondary | ICD-10-CM | POA: Insufficient documentation

## 2014-10-19 DIAGNOSIS — E785 Hyperlipidemia, unspecified: Secondary | ICD-10-CM | POA: Diagnosis not present

## 2014-10-19 DIAGNOSIS — M5441 Lumbago with sciatica, right side: Secondary | ICD-10-CM

## 2014-10-19 DIAGNOSIS — Z87442 Personal history of urinary calculi: Secondary | ICD-10-CM | POA: Diagnosis not present

## 2014-10-19 DIAGNOSIS — Z7901 Long term (current) use of anticoagulants: Secondary | ICD-10-CM | POA: Insufficient documentation

## 2014-10-19 DIAGNOSIS — Z951 Presence of aortocoronary bypass graft: Secondary | ICD-10-CM | POA: Insufficient documentation

## 2014-10-19 DIAGNOSIS — G8929 Other chronic pain: Secondary | ICD-10-CM | POA: Diagnosis not present

## 2014-10-19 DIAGNOSIS — R1030 Lower abdominal pain, unspecified: Secondary | ICD-10-CM | POA: Diagnosis not present

## 2014-10-19 DIAGNOSIS — Z8719 Personal history of other diseases of the digestive system: Secondary | ICD-10-CM | POA: Insufficient documentation

## 2014-10-19 DIAGNOSIS — I2510 Atherosclerotic heart disease of native coronary artery without angina pectoris: Secondary | ICD-10-CM | POA: Insufficient documentation

## 2014-10-19 DIAGNOSIS — Z9889 Other specified postprocedural states: Secondary | ICD-10-CM | POA: Insufficient documentation

## 2014-10-19 DIAGNOSIS — Z87891 Personal history of nicotine dependence: Secondary | ICD-10-CM | POA: Insufficient documentation

## 2014-10-19 DIAGNOSIS — M545 Low back pain: Secondary | ICD-10-CM | POA: Diagnosis present

## 2014-10-19 DIAGNOSIS — Z7982 Long term (current) use of aspirin: Secondary | ICD-10-CM | POA: Diagnosis not present

## 2014-10-19 MED ORDER — IBUPROFEN 400 MG PO TABS: 400.0000 mg | ORAL_TABLET | Freq: Four times a day (QID) | ORAL | Status: DC | PRN

## 2014-10-19 MED ORDER — METHOCARBAMOL 500 MG PO TABS: 500.0000 mg | ORAL_TABLET | Freq: Two times a day (BID) | ORAL | Status: DC

## 2014-10-19 NOTE — ED Notes (Signed)
Bed: WA22 Expected date:  Expected time:  Means of arrival:  Comments: Hold for triage 

## 2014-10-19 NOTE — ED Notes (Signed)
Per pt, back pain since last Sunday-right groin pain from where he had cardiac cath on March 3rd

## 2014-10-19 NOTE — ED Provider Notes (Signed)
CSN: [id number]     Arrival date & time 10/19/14  1514 History   First MD Initiated Contact with Patient 10/19/14 1632     Chief Complaint  Patient presents with  . Back Pain  . Groin Pain    HPI   64 year old male with a history of chronic back pain presents today with an acute worsening of pain. Patient reports that one week ago he was working in his yard when he felt a sharp pain in his lower back that radiated down his right leg. Will continue his yard work. The next morning he had difficulty getting about with sharp pains in his lower back again radiation down his right leg. He reports improvement of pain over the week, but 2 days ago went for a walk and felt again the sharp pain. He is concerned that the back pain is not resolving. He denies trauma to the back, history of trauma to the back. He reports the use [place] neurosurgeon, and a neurologist for his back pain but they were unable to find a source of his chronic back pain; this was many years prior. Not currently being seen by him. He has been using Aleve but reports it only lasts for part of the day. He reports numbness to the lateral aspect of his right thigh, reports this is chronic and intermittent since he's been having back pain. He also has decreased sensation to the medial aspect of his. The symptoms were not present at time of evaluation. He states appeared dependent on his positioning. Patient reports he is able to walk without difficulty, but has pain when he bends forward. Patient reports a significant past cardiac medical history with cardiac cath with stent placement in March 2016. He denies pain to the site. He denies headache, chest pain, shortness of breath, abdominal pain, changes in bowel or bladder frequency or characteristics, lower extremity swelling or edema.    Past Medical History  Diagnosis Date  . Peripheral neuropathy     From trauma  . B12 deficiency     Borderline  . Arthritis   . Kidney stones   . Hiatal  hernia   . CAD (coronary artery disease)     a. s/p CABG 01/2011;  b. ETT Myoview 3/14:  Low risk;  c. 09/2014 Cath/PCI: LM nl, LAD 100p, LCX 21m/OM2 60-70p (2.75x28 Synergy DES), RCA dom, 1m (atherectomy, 3.5x24 Synergy DES), PDA nl, LIMA->LAD nl, VG->Diag nl, VG->OM 100, VG->RCA 100.  [name] HLD (hyperlipidemia)   . Tobacco abuse   . Leg pain     ABIs 4/14:  R 1.2, L 1.2, TBIs normal   Past Surgical History  Procedure Laterality Date  . Colonoscopy      Neg  . Epidural steroids    . Coronary artery bypass graft    . Left heart catheterization with coronary/graft angiogram N/A 09/18/2014    Procedure: LEFT HEART CATHETERIZATION WITH [name];  Surgeon: [name], MD;  Location: [facility] CATH LAB;  Service: Cardiovascular;  Laterality: N/A;  . Percutaneous coronary rotoblator intervention (pci-r) N/A 09/20/2014    Procedure: PERCUTANEOUS CORONARY ROTOBLATOR INTERVENTION (PCI-R);  Surgeon: [name], MD;  Location: [facility] CATH LAB;  Service: Cardiovascular;  Laterality: N/A;   Family History  Problem Relation Age of Onset  . Diabetes Father   . Heart attack Father 84  . Cancer Mother    History  Substance Use Topics  . Smoking status: Former Smoker -- 1.00 packs/day for 45 years  Types: Cigarettes    Quit date: 10/01/2014  . Smokeless tobacco: Former Systems developer  . Alcohol Use: 2.4 oz/week    4 Cans of beer per week     Comment: occ    Review of Systems  All other systems reviewed and are negative.  Allergies  Iodine; Iohexol; and Shrimp  Home Medications   Prior to Admission medications   Medication Sig Start Date End Date Taking? Authorizing Provider  albuterol (PROVENTIL HFA;VENTOLIN HFA) 108 (90 BASE) MCG/ACT inhaler Inhale 1 to 2 puffs every 4 to 6 hours as needed for cough 10/12/12   Liliane Shi, PA-C  aspirin EC 81 MG tablet Take 81 mg by mouth at bedtime.     Historical Provider, MD  atorvastatin (LIPITOR) 80 MG tablet Take 1 tablet (80 mg total) by  mouth daily at 6 PM. 09/21/14   Rogelia Mire, NP  clopidogrel (PLAVIX) 75 MG tablet Take 1 tablet (75 mg total) by mouth daily with breakfast. 09/21/14   Rogelia Mire, NP  metoprolol tartrate (LOPRESSOR) 25 MG tablet Take 12.5 mg by mouth 2 (two) times daily.    Historical Provider, MD  nitroGLYCERIN (NITROSTAT) 0.4 MG SL tablet Place 1 tablet (0.4 mg total) under the tongue every 5 (five) minutes x 3 doses as needed for chest pain. 09/21/14   Rogelia Mire, NP  pantoprazole (PROTONIX) 40 MG tablet Take 1 tablet (40 mg total) by mouth daily. 09/21/14   Rogelia Mire, NP   BP 118/70 mmHg  Pulse 70  Temp(Src) 97.6 F (36.4 C) (Oral)  Resp 20  SpO2 99% Physical Exam  Constitutional: He is oriented to person, place, and time. He appears well-developed and well-nourished.  HENT:  Head: Normocephalic and atraumatic.  Eyes: Pupils are equal, round, and reactive to light.  Neck: Normal range of motion. Neck supple. No JVD present. No tracheal deviation present. No thyromegaly present.  Cardiovascular: Normal rate, regular rhythm, normal heart sounds and intact distal pulses.  Exam reveals no gallop and no friction rub.   No murmur heard. Pulmonary/Chest: Effort normal and breath sounds normal. No stridor. No respiratory distress. He has no wheezes. He has no rales. He exhibits no tenderness.  Musculoskeletal: Normal range of motion.  The C-spine T-spine and L-spine tenderness. No tenderness to palpation to any area of back or hips. Patient has full hip extension, right lateral lower back pain with flexion. Negative straight leg raise test no signs of trauma, distal sensation, strength, and reflexes intact bilaterally. Pain-free ambulation.   Right groin skin discoloration post cath, no signs of infection, swelling, abnormalities  Lymphadenopathy:    He has no cervical adenopathy.  Neurological: He is alert and oriented to person, place, and time. Coordination normal.  Skin: Skin  is warm and dry.  Psychiatric: He has a normal mood and affect. His behavior is normal. Judgment and thought content normal.  Nursing note and vitals reviewed.   ED Course  Procedures (including critical care time) Labs Review Labs Reviewed - No data to display  Imaging Review No results found.   EKG Interpretation None      MDM   Final diagnoses:  Low back pain with right-sided sciatica, unspecified back pain laterality    Patient presents with acute exacerbation of chronic back pain. No history or signs of trauma present, no need for further imaging at this time. Patient reports improvement of symptoms over the past week, good relief with Aleve. Patient was given discharge medications,  instructions for back exercises, and instructed follow-up with his primary care provider for further evaluation and management.   No neurological deficits and normal neuro exam.  Patient can walk but states is painful.  No loss of bowel or bladder control.  No concern for cauda equina.  No fever, night sweats, weight loss, h/o cancer, IVDU.  RICE protocol and pain medicine indicated and discussed with patient.     Okey Regal, PA-C 10/20/14 [place], MD 10/21/14 [phone]

## 2014-10-19 NOTE — Discharge Instructions (Signed)
Back Exercises Back exercises help treat and prevent back injuries. The goal of back exercises is to increase the strength of your abdominal and back muscles and the flexibility of your back. These exercises should be started when you no longer have back pain. Back exercises include:  Pelvic Tilt. Lie on your back with your knees bent. Tilt your pelvis until the lower part of your back is against the floor. Hold this position 5 to 10 sec and repeat 5 to 10 times.  Knee to Chest. Pull first 1 knee up against your chest and hold for 20 to 30 seconds, repeat this with the other knee, and then both knees. This may be done with the other leg straight or bent, whichever feels better.  Sit-Ups or Curl-Ups. Bend your knees 90 degrees. Start with tilting your pelvis, and do a partial, slow sit-up, lifting your trunk only 30 to 45 degrees off the floor. Take at least 2 to 3 seconds for each sit-up. Do not do sit-ups with your knees out straight. If partial sit-ups are difficult, simply do the above but with only tightening your abdominal muscles and holding it as directed.  Hip-Lift. Lie on your back with your knees flexed 90 degrees. Push down with your feet and shoulders as you raise your hips a couple inches off the floor; hold for 10 seconds, repeat 5 to 10 times.  Back arches. Lie on your stomach, propping yourself up on bent elbows. Slowly press on your hands, causing an arch in your low back. Repeat 3 to 5 times. Any initial stiffness and discomfort should lessen with repetition over time.  Shoulder-Lifts. Lie face down with arms beside your body. Keep hips and torso pressed to floor as you slowly lift your head and shoulders off the floor. Do not overdo your exercises, especially in the beginning. Exercises may cause you some mild back discomfort which lasts for a few minutes; however, if the pain is more severe, or lasts for more than 15 minutes, do not continue exercises until you see your caregiver.  Improvement with exercise therapy for back problems is slow.  See your caregivers for assistance with developing a proper back exercise program. Document Released: 08/13/2004 Document Revised: 09/28/2011 Document Reviewed: 05/07/2011 [facility] Patient Information 2015 [place], [place]. This information is not intended to replace advice given to you by your health care provider. Make sure you discuss any questions you have with your health care provider.  Please use medication as prescribed. Ice and heat can be used for pain relief. Avoid aggravating activities including lifting heavy objects. Please contact her primary care provider and schedule a follow-up appointment for further evaluation and management of chronic back pain. Please monitor for worsening signs or symptoms including increased back pain, increased radiation of symptoms, changes in bowel or bladder functioning, fever. Please return immediately if any of the above present.

## 2014-10-25 ENCOUNTER — Encounter: Payer: Self-pay | Admitting: Cardiovascular Disease

## 2014-10-25 ENCOUNTER — Ambulatory Visit (INDEPENDENT_AMBULATORY_CARE_PROVIDER_SITE_OTHER): Payer: Medicare Other | Admitting: Cardiovascular Disease

## 2014-10-25 VITALS — BP 115/62 | HR 60 | Ht 67.5 in | Wt 182.4 lb

## 2014-10-25 DIAGNOSIS — I208 Other forms of angina pectoris: Secondary | ICD-10-CM

## 2014-10-25 DIAGNOSIS — I2510 Atherosclerotic heart disease of native coronary artery without angina pectoris: Secondary | ICD-10-CM | POA: Diagnosis not present

## 2014-10-25 NOTE — Patient Instructions (Signed)
Keep lab appointment for fasting blood work.  Your physician wants you to follow-up in: 6 MONTHS with Richardson Dopp PA-C.  You will receive a reminder letter in the mail two months in advance. If you don't receive a letter, please call our office to schedule the follow-up appointment.  Your physician wants you to follow-up in: 1 YEAR with Dr Burt Knack.  You will receive a reminder letter in the mail two months in advance. If you don't receive a letter, please call our office to schedule the follow-up appointment.  Your physician recommends that you continue on your current medications as directed. Please refer to the Current Medication list given to you today.

## 2014-10-25 NOTE — Progress Notes (Signed)
Cardiology Office Note   Date:  10/25/2014   ID:  Derrick Stokes, DOB Feb 19, 1951, MRN [id number]  PCP:  [name], MD  Cardiologist:  Sherren Mocha, MD    No chief complaint on file.    History of Present Illness: Derrick Stokes is a 64 y.o. male who presents for follow-up evaluation. The patient has extensive coronary artery disease. He has a history of CABG, hyperlipidemia, and tobacco abuse. He was hospitalized with unstable angina 09/16/2014 - 09/21/2014. Cardiac catheterization demonstrated occlusion of the saphenous vein graft obtuse marginal and saphenous vein graft RCA. The LIMA to LAD and saphenous vein graft to diagonal were both patent. The patient underwent staged PCI's of the native left circumflex and right coronary artery during that hospital admission. He presents today for evaluation.  He has quit smoking. He's had a few fleeting chest pains but nothing like what he experienced prior to PCI. He denies exertional symptoms. Specifically denies lightheadedness, presyncope, or exertional dyspnea. He's tolerating his medications. He denies bleeding problems.   Past Medical History  Diagnosis Date  . Peripheral neuropathy     From trauma  . B12 deficiency     Borderline  . Arthritis   . Kidney stones   . Hiatal hernia   . CAD (coronary artery disease)     a. s/p CABG 01/2011;  b. ETT Myoview 3/14:  Low risk;  c. 09/2014 Cath/PCI: LM nl, LAD 100p, LCX 37m/OM2 60-70p (2.75x28 Synergy DES), RCA dom, 45m (atherectomy, 3.5x24 Synergy DES), PDA nl, LIMA->LAD nl, VG->Diag nl, VG->OM 100, VG->RCA 100.  [name] HLD (hyperlipidemia)   . Tobacco abuse   . Leg pain     ABIs 4/14:  R 1.2, L 1.2, TBIs normal    Past Surgical History  Procedure Laterality Date  . Colonoscopy      Neg  . Epidural steroids    . Coronary artery bypass graft    . Left heart catheterization with coronary/graft angiogram N/A 09/18/2014    Procedure: LEFT HEART CATHETERIZATION WITH [name];  Surgeon: [name], MD;  Location: [facility] CATH LAB;  Service: Cardiovascular;  Laterality: N/A;  . Percutaneous coronary rotoblator intervention (pci-r) N/A 09/20/2014    Procedure: PERCUTANEOUS CORONARY ROTOBLATOR INTERVENTION (PCI-R);  Surgeon: [name], MD;  Location: [facility] CATH LAB;  Service: Cardiovascular;  Laterality: N/A;    Current Outpatient Prescriptions  Medication Sig Dispense Refill  . albuterol (PROVENTIL HFA;VENTOLIN HFA) 108 (90 BASE) MCG/ACT inhaler Inhale 1 to 2 puffs every 4 to 6 hours as needed for cough 1 Inhaler 6  . aspirin EC 81 MG tablet Take 81 mg by mouth at bedtime.     [name] atorvastatin (LIPITOR) 80 MG tablet Take 1 tablet (80 mg total) by mouth daily at 6 PM. 30 tablet 6  . clopidogrel (PLAVIX) 75 MG tablet Take 1 tablet (75 mg total) by mouth daily with breakfast. 30 tablet 6  . ibuprofen (ADVIL,MOTRIN) 400 MG tablet Take 1 tablet (400 mg total) by mouth every 6 (six) hours as needed. 30 tablet 0  . methocarbamol (ROBAXIN) 500 MG tablet Take 1 tablet (500 mg total) by mouth 2 (two) times daily. 20 tablet 0  . metoprolol tartrate (LOPRESSOR) 25 MG tablet Take 12.5 mg by mouth 2 (two) times daily.    . nitroGLYCERIN (NITROSTAT) 0.4 MG SL tablet Place 1 tablet (0.4 mg total) under the tongue every 5 (five) minutes x 3 doses as needed for chest pain. 25 tablet  3  . pantoprazole (PROTONIX) 40 MG tablet Take 1 tablet (40 mg total) by mouth daily. 30 tablet 6   No current facility-administered medications for this visit.    Allergies:   Iodine; Iohexol; and Shrimp   Social History:  The patient  reports that he quit smoking about 3 weeks ago. His smoking use included Cigarettes. He has a 45 pack-year smoking history. He has quit using smokeless tobacco. He reports that he drinks about 2.4 oz of alcohol per week. He reports that he does not use illicit drugs.   Family History:  The patient's family history includes Cancer in his mother; Diabetes in  his father; Heart attack (age of onset: 1) in his father.    ROS:  Please see the history of present illness.  Otherwise, review of systems is positive for hearing loss, back pain, muscle pain, easy bruising.  All other systems are reviewed and negative.    PHYSICAL EXAM: VS:  BP 115/62 mmHg  Pulse 60  Ht 5' 7.5" (1.715 m)  Wt 182 lb 6.4 oz (82.736 kg)  BMI 28.13 kg/m2  SpO2 97% , BMI Body mass index is 28.13 kg/(m^2). GEN: Well nourished, well developed, in no acute distress HEENT: normal Neck: no JVD, no masses. No carotid bruits Cardiac: RRR without murmur or gallop                Respiratory:  clear to auscultation bilaterally, normal work of breathing GI: soft, nontender, nondistended, + BS MS: no deformity or atrophy Ext: no pretibial edema, pedal pulses 2+= bilaterally Skin: warm and dry, no rash Neuro:  Strength and sensation are intact Psych: euthymic mood, full affect  EKG:  EKG is not ordered today.  Recent Labs: 09/16/2014: B Natriuretic Peptide 16.4; TSH 3.180 09/17/2014: ALT 18 09/21/2014: BUN 13; Creatinine 0.80; Hemoglobin 13.6; Platelets 171; Potassium 3.8; Sodium 137   Lipid Panel     Component Value Date/Time   CHOL 159 09/17/2014 0005   TRIG 145 09/17/2014 0005   HDL 30* 09/17/2014 0005   CHOLHDL 5.3 09/17/2014 0005   VLDL 29 09/17/2014 0005   LDLCALC 100* 09/17/2014 0005      Wt Readings from Last 3 Encounters:  10/25/14 182 lb 6.4 oz (82.736 kg)  10/08/14 183 lb 12.8 oz (83.371 kg)  09/21/14 183 lb 10.3 oz (83.3 kg)    ASSESSMENT AND PLAN: 1.  CAD, native vessel, without symptoms of angina. The patient is doing well after his recent PCI procedure. He is tolerating dual antiplatelet therapy with aspirin and) grow. He will continue on his current medical program which includes a high intensity statin drug and a beta blocker.   2. Hyperlipidemia: The patient is scheduled for a lipid panel and LFTs  3. Tobacco abuse: The patient has quit  smoking  Current medicines are reviewed with the patient today.  The patient does not have concerns regarding medicines.  The following changes have been made:  no change  Labs/ tests ordered today include:  No orders of the defined types were placed in this encounter.    Disposition:   FU 6 months with Richardson Dopp, PA-C  Signed, Sherren Mocha, MD  10/25/2014 3:35 PM    [facility] [place], [place], [place]  [address] Phone: [phone]; Fax: [phone]

## 2014-11-19 ENCOUNTER — Other Ambulatory Visit (INDEPENDENT_AMBULATORY_CARE_PROVIDER_SITE_OTHER): Payer: Medicare Other | Admitting: *Deleted

## 2014-11-19 DIAGNOSIS — E785 Hyperlipidemia, unspecified: Secondary | ICD-10-CM | POA: Diagnosis not present

## 2014-11-19 LAB — HEPATIC FUNCTION PANEL
ALK PHOS: 74 U/L (ref 39–117)
ALT: 64 U/L — ABNORMAL HIGH (ref 0–53)
AST: 38 U/L — AB (ref 0–37)
Albumin: 3.9 g/dL (ref 3.5–5.2)
BILIRUBIN DIRECT: 0.2 mg/dL (ref 0.0–0.3)
Total Bilirubin: 0.9 mg/dL (ref 0.2–1.2)
Total Protein: 7 g/dL (ref 6.0–8.3)

## 2014-11-19 LAB — LIPID PANEL
Cholesterol: 143 mg/dL (ref 0–200)
HDL: 33.6 mg/dL — ABNORMAL LOW (ref >39.00)
NonHDL: 109.4
Total CHOL/HDL Ratio: 4
Triglycerides: 201 mg/dL — ABNORMAL HIGH (ref 0.0–149.0)
VLDL: 40.2 mg/dL — AB (ref 0.0–40.0)

## 2014-11-19 LAB — LDL CHOLESTEROL, DIRECT: Direct LDL: 68 mg/dL

## 2014-11-21 ENCOUNTER — Telehealth: Payer: Self-pay

## 2014-11-21 DIAGNOSIS — E785 Hyperlipidemia, unspecified: Secondary | ICD-10-CM

## 2014-11-21 NOTE — Telephone Encounter (Signed)
s/w wife. No DPR on file, instructions given to complete DPR upon next visit so  office can speak with her. Pt. to come in June 10th for LFT. Wife verbalized understanding.

## 2014-12-05 ENCOUNTER — Other Ambulatory Visit: Payer: Self-pay | Admitting: Cardiovascular Disease

## 2014-12-28 ENCOUNTER — Other Ambulatory Visit: Payer: Medicare Other

## 2015-01-18 ENCOUNTER — Other Ambulatory Visit: Payer: Medicare Other

## 2015-04-04 ENCOUNTER — Encounter: Payer: Self-pay | Admitting: Gastroenterology

## 2015-04-09 ENCOUNTER — Other Ambulatory Visit: Payer: Self-pay | Admitting: Cardiovascular Disease

## 2015-04-09 MED ORDER — PANTOPRAZOLE SODIUM 40 MG PO TBEC: 40.0000 mg | DELAYED_RELEASE_TABLET | Freq: Every day | ORAL | Status: DC

## 2015-04-25 ENCOUNTER — Other Ambulatory Visit: Payer: Self-pay | Admitting: *Deleted

## 2015-04-25 MED ORDER — CLOPIDOGREL BISULFATE 75 MG PO TABS: 75.0000 mg | ORAL_TABLET | Freq: Every day | ORAL | Status: DC

## 2015-05-01 ENCOUNTER — Other Ambulatory Visit: Payer: Self-pay | Admitting: Physician Assistant

## 2015-05-01 DIAGNOSIS — I200 Unstable angina: Secondary | ICD-10-CM

## 2015-05-02 ENCOUNTER — Ambulatory Visit (HOSPITAL_COMMUNITY): Payer: Medicare Other | Attending: Cardiology

## 2015-05-02 ENCOUNTER — Other Ambulatory Visit: Payer: Self-pay

## 2015-05-02 DIAGNOSIS — F17200 Nicotine dependence, unspecified, uncomplicated: Secondary | ICD-10-CM | POA: Diagnosis not present

## 2015-05-02 DIAGNOSIS — I25110 Atherosclerotic heart disease of native coronary artery with unstable angina pectoris: Secondary | ICD-10-CM | POA: Diagnosis not present

## 2015-05-02 DIAGNOSIS — E785 Hyperlipidemia, unspecified: Secondary | ICD-10-CM | POA: Insufficient documentation

## 2015-05-02 DIAGNOSIS — I200 Unstable angina: Secondary | ICD-10-CM | POA: Diagnosis not present

## 2015-05-02 DIAGNOSIS — I071 Rheumatic tricuspid insufficiency: Secondary | ICD-10-CM | POA: Insufficient documentation

## 2015-05-02 DIAGNOSIS — I340 Nonrheumatic mitral (valve) insufficiency: Secondary | ICD-10-CM | POA: Diagnosis not present

## 2015-05-02 DIAGNOSIS — Z951 Presence of aortocoronary bypass graft: Secondary | ICD-10-CM | POA: Insufficient documentation

## 2015-05-03 ENCOUNTER — Encounter: Payer: Self-pay | Admitting: Physician Assistant

## 2015-05-03 ENCOUNTER — Telehealth: Payer: Self-pay | Admitting: *Deleted

## 2015-05-03 NOTE — Telephone Encounter (Signed)
No answer x2 

## 2015-05-03 NOTE — Telephone Encounter (Signed)
No answer, will try again later.

## 2015-05-05 NOTE — Progress Notes (Signed)
Cardiology Office Note   Date:  05/06/2015   ID:  Derrick Stokes, DOB 1950-08-30, MRN [id number]  PCP:  [name], MD  Cardiologist:  Dr. Sherren Mocha   Electrophysiologist:  n/a  Chief Complaint  Patient presents with  . Follow-up  . Coronary Artery Disease     History of Present Illness: Derrick Stokes is a 64 y.o. male with a hx of CAD s/p CABG, HL, tobacco abuse. Admitted 2/16 with [place]. LHC demonstrated an occluded S-OM and S-RCA. L-LAD, S-Dx grafts were patent. There was high grade disease in the mid LCx leading into OM2 and high grade disease in the mid RCA. He underwent PCI with a DES to the LCx leading into the OM2. He was brought back for staged intervention 2 days later with orbital atherectomy and DES to the RCA. Last seen by Dr. Burt Knack 4/16. Follow-up echo was arranged prior to his visit today. This was performed 05/02/15 and demonstrated normal LV function with mild diastolic dysfunction and mild mitral regurgitation. He returns for follow-up.  Here with his wife.  He has been doing very well.  He denies chest pain, significant dyspnea.  He denies orthopnea, PND, edema.  He denies syncope.     Studies/Reports Reviewed Today:  Echo 05/02/15 EF 50-55%, normal wall motion, grade 1 diastolic dysfunction, mild MR, normal RVSF, mild TR  Cardiac cath/staged PCI 09/18/14 LM: patent LCx: patent. Prox stent widely patent. RCA: heavily calcified mid vessel lesion, up to 90%. PCI: orbital atherectomy and 3.5 x 24 Synergy DES to RCA  Cardiac cath/PCI 09/20/14 LM: mild disease.  LAD: occluded proximally.  LCx: Mid 80%, proximal OM2 calcified 60-70% RCA: Mid calcified 80% LIMA-LAD: widely patent.  SVG-Dx: widely patent.  SVG-OM: occluded. SVG-RCA: occluded. LVEDP was 14 mmHg. PCI: 2.75 x 28 Synergy DES to LCx and OM2  Myoview 10/18/12 Inferior thinning versus prior infarct, no ischemia, EF 59%; low risk   Past Medical History  Diagnosis Date  .  Peripheral neuropathy ([place])     From trauma  . B12 deficiency     Borderline  . Arthritis   . Kidney stones   . Hiatal hernia   . CAD (coronary artery disease)     a. s/p CABG 01/2011;  b. ETT Myoview 3/14:  Low risk;  c. 09/2014 Cath/PCI: LM nl, LAD 100p, LCX 81m/OM2 60-70p (2.75x28 Synergy DES), RCA dom, 77m (atherectomy, 3.5x24 Synergy DES), PDA nl, LIMA->LAD nl, VG->Diag nl, VG->OM 100, VG->RCA 100.  [name] HLD (hyperlipidemia)   . Tobacco abuse   . Leg pain     ABIs 4/14:  R 1.2, L 1.2, TBIs normal  . History of echocardiogram     Echo 10/16: EF 50-55%, no RWMA, Gr 1 DD, mild MR, mild TR    Past Surgical History  Procedure Laterality Date  . Colonoscopy      Neg  . Epidural steroids    . Coronary artery bypass graft    . Left heart catheterization with coronary/graft angiogram N/A 09/18/2014    Procedure: LEFT HEART CATHETERIZATION WITH [name];  Surgeon: [name], MD;  Location: [facility] CATH LAB;  Service: Cardiovascular;  Laterality: N/A;  . Percutaneous coronary rotoblator intervention (pci-r) N/A 09/20/2014    Procedure: PERCUTANEOUS CORONARY ROTOBLATOR INTERVENTION (PCI-R);  Surgeon: [name], MD;  Location: [facility] CATH LAB;  Service: Cardiovascular;  Laterality: N/A;     Current Outpatient Prescriptions  Medication Sig Dispense Refill  . aspirin EC 81 MG tablet  Take 81 mg by mouth at bedtime.     [name] atorvastatin (LIPITOR) 80 MG tablet Take 1 tablet (80 mg total) by mouth daily at 6 PM. 90 tablet 3  . clopidogrel (PLAVIX) 75 MG tablet Take 1 tablet (75 mg total) by mouth daily with breakfast. 30 tablet 6  . metoprolol tartrate (LOPRESSOR) 25 MG tablet Take 12.5 mg by mouth 2 (two) times daily.    . metoprolol tartrate (LOPRESSOR) 25 MG tablet take 1/2 tablet by mouth twice a day 30 tablet 6  . nitroGLYCERIN (NITROSTAT) 0.4 MG SL tablet Place 1 tablet (0.4 mg total) under the tongue every 5 (five) minutes x 3 doses as needed for chest pain. 25 tablet 3    . pantoprazole (PROTONIX) 40 MG tablet Take 1 tablet (40 mg total) by mouth daily. 30 tablet 1   No current facility-administered medications for this visit.    Allergies:   Iodine; Iohexol; and Shrimp    Social History:  The patient  reports that he quit smoking about 7 months ago. His smoking use included Cigarettes. He has a 45 pack-year smoking history. He has quit using smokeless tobacco. He reports that he drinks about 2.4 oz of alcohol per week. He reports that he does not use illicit drugs.   Family History:  The patient's family history includes Cancer in his mother; Diabetes in his father; Heart attack (age of onset: 58) in his father.    ROS:   Please see the history of present illness.   Review of Systems  All other systems reviewed and are negative.     PHYSICAL EXAM: VS:  BP 108/70 mmHg  Pulse 59  Ht 5\' 8"  (1.727 m)  Wt 173 lb (78.472 kg)  BMI 26.31 kg/m2    Wt Readings from Last 3 Encounters:  05/06/15 173 lb (78.472 kg)  10/25/14 182 lb 6.4 oz (82.736 kg)  10/08/14 183 lb 12.8 oz (83.371 kg)     GEN: Well nourished, well developed, in no acute distress HEENT: normal Neck: no JVD, no masses Cardiac:  Normal S1/S2, RRR; no murmur ,  no rubs or gallops, no edema   Respiratory:  clear to auscultation bilaterally, no wheezing, rhonchi or rales. GI: soft, nontender, nondistended, + BS MS: no deformity or atrophy Skin: warm and dry  Neuro:  CNs II-XII intact, Strength and sensation are intact Psych: Normal affect   EKG:  EKG is ordered today.  It demonstrates:   Sinus brady, HR 59, normal axis, NSSTTW changes, QTc 413 ms.   Recent Labs: 09/16/2014: B Natriuretic Peptide 16.4; TSH 3.180 09/21/2014: BUN 13; Creatinine, Ser 0.80; Hemoglobin 13.6; Platelets 171; Potassium 3.8; Sodium 137 11/19/2014: ALT 64*    Lipid Panel    Component Value Date/Time   CHOL 143 11/19/2014 0737   TRIG 201.0* 11/19/2014 0737   HDL 33.60* 11/19/2014 0737   CHOLHDL 4  11/19/2014 0737   VLDL 40.2* 11/19/2014 0737   LDLCALC 100* 09/17/2014 0005   LDLDIRECT 68.0 11/19/2014 0737      ASSESSMENT AND PLAN:  1. CAD: Status post CABG.  The patient underwent PCI in 3/16. Cardiac catheterization demonstrated 2 out of 4 grafts patent. DES was placed to the LCx and OM2. He also underwent staged DES to the RCA.  Recent echo demonstrated normal LVF.  He is doing well without angina.  Continue ASA, Plavix, statin, beta-blocker.  2. Hyperlipidemia:   LDL 68 in 5/16.  LFTs were elevated at that time.  Will  arrange repeat LFTs.    3. Tobacco Abuse:  He has quit smoking.      Medication Changes: Current medicines are reviewed at length with the patient today.  Concerns regarding medicines are as outlined above.  The following changes have been made:   Discontinued Medications   ALBUTEROL (PROVENTIL HFA;VENTOLIN HFA) 108 (90 BASE) MCG/ACT INHALER    Inhale 1 to 2 puffs every 4 to 6 hours as needed for cough   IBUPROFEN (ADVIL,MOTRIN) 400 MG TABLET    Take 1 tablet (400 mg total) by mouth every 6 (six) hours as needed.   METHOCARBAMOL (ROBAXIN) 500 MG TABLET    Take 1 tablet (500 mg total) by mouth 2 (two) times daily.   Modified Medications   Modified Medication Previous Medication   ATORVASTATIN (LIPITOR) 80 MG TABLET atorvastatin (LIPITOR) 80 MG tablet      Take 1 tablet (80 mg total) by mouth daily at 6 PM.    Take 1 tablet (80 mg total) by mouth daily at 6 PM.   New Prescriptions   No medications on file   Labs/ tests ordered today include:   Orders Placed This Encounter  Procedures  . Flu Vaccine QUAD 36+ mos IM  . Hepatic function panel  . EKG 12-Lead     Disposition:    FU with Dr. Sherren Mocha 6 mos.     Signed, Versie Starks, MHS 05/06/2015 4:48 PM    [facility] [place], [place], [place]  [address] Phone: [phone]; Fax: [phone]

## 2015-05-06 ENCOUNTER — Encounter: Payer: Self-pay | Admitting: Physician Assistant

## 2015-05-06 ENCOUNTER — Ambulatory Visit (INDEPENDENT_AMBULATORY_CARE_PROVIDER_SITE_OTHER): Payer: Medicare Other | Admitting: Physician Assistant

## 2015-05-06 VITALS — BP 108/70 | HR 59 | Ht 68.0 in | Wt 173.0 lb

## 2015-05-06 DIAGNOSIS — E785 Hyperlipidemia, unspecified: Secondary | ICD-10-CM

## 2015-05-06 DIAGNOSIS — R7989 Other specified abnormal findings of blood chemistry: Secondary | ICD-10-CM | POA: Diagnosis not present

## 2015-05-06 DIAGNOSIS — Z23 Encounter for immunization: Secondary | ICD-10-CM

## 2015-05-06 DIAGNOSIS — I2510 Atherosclerotic heart disease of native coronary artery without angina pectoris: Secondary | ICD-10-CM

## 2015-05-06 DIAGNOSIS — R945 Abnormal results of liver function studies: Secondary | ICD-10-CM

## 2015-05-06 DIAGNOSIS — R799 Abnormal finding of blood chemistry, unspecified: Secondary | ICD-10-CM | POA: Diagnosis not present

## 2015-05-06 DIAGNOSIS — I200 Unstable angina: Secondary | ICD-10-CM

## 2015-05-06 LAB — HEPATIC FUNCTION PANEL
ALBUMIN: 4.3 g/dL (ref 3.6–5.1)
ALT: 37 U/L (ref 9–46)
AST: 35 U/L (ref 10–35)
Alkaline Phosphatase: 65 U/L (ref 40–115)
BILIRUBIN TOTAL: 0.9 mg/dL (ref 0.2–1.2)
Bilirubin, Direct: 0.2 mg/dL (ref <=0.2)
Indirect Bilirubin: 0.7 mg/dL (ref 0.2–1.2)
Total Protein: 7.4 g/dL (ref 6.1–8.1)

## 2015-05-06 MED ORDER — ATORVASTATIN CALCIUM 80 MG PO TABS: 80.0000 mg | ORAL_TABLET | Freq: Every day | ORAL | Status: DC

## 2015-05-06 NOTE — Patient Instructions (Signed)
Medication Instructions:  A REFILL FOR LIPITOR  Labwork: TODAY LFT  Testing/Procedures: NONE  Follow-Up: DR. Burt Knack 10/2015; WE WILL SEND OUT A REMINDER LETTER FOR YOU TO MAKE AN APPT  Any Other Special Instructions Will Be Listed Below (If Applicable).

## 2015-05-30 NOTE — Telephone Encounter (Signed)
Pt seen 10/17

## 2015-06-15 ENCOUNTER — Other Ambulatory Visit: Payer: Self-pay | Admitting: Cardiovascular Disease

## 2015-08-04 ENCOUNTER — Other Ambulatory Visit: Payer: Self-pay | Admitting: Cardiovascular Disease

## 2015-09-27 DIAGNOSIS — H52203 Unspecified astigmatism, bilateral: Secondary | ICD-10-CM | POA: Diagnosis not present

## 2015-09-27 DIAGNOSIS — H25043 Posterior subcapsular polar age-related cataract, bilateral: Secondary | ICD-10-CM | POA: Diagnosis not present

## 2015-11-06 DIAGNOSIS — H25811 Combined forms of age-related cataract, right eye: Secondary | ICD-10-CM | POA: Diagnosis not present

## 2015-11-06 DIAGNOSIS — H25041 Posterior subcapsular polar age-related cataract, right eye: Secondary | ICD-10-CM | POA: Diagnosis not present

## 2015-11-06 DIAGNOSIS — H2511 Age-related nuclear cataract, right eye: Secondary | ICD-10-CM | POA: Diagnosis not present

## 2015-11-27 DIAGNOSIS — H25812 Combined forms of age-related cataract, left eye: Secondary | ICD-10-CM | POA: Diagnosis not present

## 2015-11-27 DIAGNOSIS — H2512 Age-related nuclear cataract, left eye: Secondary | ICD-10-CM | POA: Diagnosis not present

## 2015-11-28 ENCOUNTER — Other Ambulatory Visit: Payer: Self-pay | Admitting: Nurse Practitioner

## 2015-11-29 ENCOUNTER — Other Ambulatory Visit: Payer: Self-pay

## 2015-11-29 MED ORDER — METOPROLOL TARTRATE 25 MG PO TABS: 12.5000 mg | ORAL_TABLET | Freq: Two times a day (BID) | ORAL | Status: DC

## 2015-12-10 ENCOUNTER — Other Ambulatory Visit: Payer: Self-pay

## 2015-12-10 ENCOUNTER — Other Ambulatory Visit: Payer: Self-pay | Admitting: *Deleted

## 2015-12-10 DIAGNOSIS — E785 Hyperlipidemia, unspecified: Secondary | ICD-10-CM

## 2015-12-10 MED ORDER — CLOPIDOGREL BISULFATE 75 MG PO TABS: ORAL_TABLET | ORAL | Status: DC

## 2015-12-10 MED ORDER — PANTOPRAZOLE SODIUM 40 MG PO TBEC: 40.0000 mg | DELAYED_RELEASE_TABLET | Freq: Every day | ORAL | Status: DC

## 2015-12-10 MED ORDER — METOPROLOL TARTRATE 25 MG PO TABS: 12.5000 mg | ORAL_TABLET | Freq: Two times a day (BID) | ORAL | Status: DC

## 2016-01-02 ENCOUNTER — Ambulatory Visit (INDEPENDENT_AMBULATORY_CARE_PROVIDER_SITE_OTHER): Payer: Medicare HMO | Admitting: Cardiovascular Disease

## 2016-01-02 ENCOUNTER — Encounter: Payer: Self-pay | Admitting: Cardiovascular Disease

## 2016-01-02 VITALS — BP 110/80 | HR 68 | Ht 68.0 in | Wt 170.0 lb

## 2016-01-02 DIAGNOSIS — I25709 Atherosclerosis of coronary artery bypass graft(s), unspecified, with unspecified angina pectoris: Secondary | ICD-10-CM | POA: Diagnosis not present

## 2016-01-02 DIAGNOSIS — E785 Hyperlipidemia, unspecified: Secondary | ICD-10-CM

## 2016-01-02 DIAGNOSIS — I209 Angina pectoris, unspecified: Secondary | ICD-10-CM | POA: Diagnosis not present

## 2016-01-02 NOTE — Patient Instructions (Signed)
Medication Instructions:  Your physician recommends that you continue on your current medications as directed. Please refer to the Current Medication list given to you today.  Labwork: Your physician recommends that you have lab work today: LIPID and LIVER  Testing/Procedures: No new orders.   Follow-Up: Your physician wants you to follow-up in: 1 YEAR with Dr Cooper.  You will receive a reminder letter in the mail two months in advance. If you don't receive a letter, please call our office to schedule the follow-up appointment.   Any Other Special Instructions Will Be Listed Below (If Applicable).     If you need a refill on your cardiac medications before your next appointment, please call your pharmacy.   

## 2016-01-02 NOTE — Progress Notes (Signed)
Cardiology Office Note Date:  01/02/2016   ID:  Derrick Stokes, DOB September 04, 1950, MRN [id number]  PCP:  [name], MD  Cardiologist:  Sherren Mocha, MD    Chief Complaint  Patient presents with  . Coronary Artery Disease     History of Present Illness: Derrick Stokes is a 65 y.o. male who presents for Follow-up evaluation. The patient is followed for coronary artery disease. He has a history of CABG, hyperlipidemia, and tobacco abuse. He presented in 2016 with unstable angina and was found to have total occlusion of the saphenous vein graft OM and saphenous vein graft to right coronary artery. The LIMA to LAD and saphenous graft to diagonal patent. He underwent PCI of the native left circumflex and atherectomy and PCI the mid right coronary artery. He's done well since that time. He's had occasional chest discomfort but he describes it as mild and self-limiting. He has not required any nitroglycerin. He denies dyspnea, orthopnea, PND, or heart palpitations. He's been compliant with his medications. He is back to smoking about one half pack daily.   Past Medical History  Diagnosis Date  . Peripheral neuropathy ([place])     From trauma  . B12 deficiency     Borderline  . Arthritis   . Kidney stones   . Hiatal hernia   . CAD (coronary artery disease)     a. s/p CABG 01/2011;  b. ETT Myoview 3/14:  Low risk;  c. 09/2014 Cath/PCI: LM nl, LAD 100p, LCX 9mOM2 60-70p (2.75x28 Synergy DES), RCA dom, 84matherectomy, 3.5x24 Synergy DES), PDA nl, LIMA->LAD nl, VG->Diag nl, VG->OM 100, VG->RCA 100.  . Marland KitchenLD (hyperlipidemia)   . Tobacco abuse   . Leg pain     ABIs 4/14:  R 1.2, L 1.2, TBIs normal  . History of echocardiogram     Echo 10/16: EF 50-55%, no RWMA, Gr 1 DD, mild MR, mild TR    Past Surgical History  Procedure Laterality Date  . Colonoscopy      Neg  . Epidural steroids    . Coronary artery bypass graft    . Left heart catheterization with coronary/graft angiogram N/A  09/18/2014    Procedure: LEFT HEART CATHETERIZATION WITH COBeatrix Fetters Surgeon: JaJettie BoozeMD;  [facility];  Service: Cardiovascular;  Laterality: N/A;  . Percutaneous coronary rotoblator intervention (pci-r) N/A 09/20/2014    Procedure: PERCUTANEOUS CORONARY ROTOBLATOR INTERVENTION (PCI-R);  Surgeon: JaJettie BoozeMD;  [facility];  Service: Cardiovascular;  Laterality: N/A;    Current Outpatient Prescriptions  Medication Sig Dispense Refill  . aspirin EC 81 MG tablet Take 81 mg by mouth at bedtime.     . Marland Kitchentorvastatin (LIPITOR) 80 MG tablet Take 1 tablet (80 mg total) by mouth daily at 6 PM. 90 tablet 3  . clopidogrel (PLAVIX) 75 MG tablet TAKE 1 TABLET BY MOUTH EVERY DAY WITH BREAKFAST 90 tablet 0  . metoprolol tartrate (LOPRESSOR) 25 MG tablet Take 0.5 tablets (12.5 mg total) by mouth 2 (two) times daily. 90 tablet 1  . nitroGLYCERIN (NITROSTAT) 0.4 MG SL tablet Place 1 tablet (0.4 mg total) under the tongue every 5 (five) minutes x 3 doses as needed for chest pain. 25 tablet 3  . pantoprazole (PROTONIX) 40 MG tablet Take 1 tablet (40 mg total) by mouth daily. 90 tablet 0   No current facility-administered medications for this visit.    Allergies:   Iodine; Iohexol; and Shrimp  Social History:  The patient  reports that he quit smoking about 15 months ago. His smoking use included Cigarettes. He has a 45 pack-year smoking history. He has quit using smokeless tobacco. He reports that he drinks about 2.4 oz of alcohol per week. He reports that he does not use illicit drugs.   Family History:  The patient's  family history includes Cancer in his mother; Diabetes in his father; Heart attack (age of onset: 45) in his father.    ROS:  Please see the history of present illness.  All other systems are reviewed and negative.    PHYSICAL EXAM: VS:  BP 110/80 mmHg  Pulse 68  Ht '5\' 8"'  (1.727 m)  Wt 170 lb (77.111 kg)  BMI 25.85 kg/m2 , BMI Body  mass index is 25.85 kg/(m^2). GEN: Well nourished, well developed, in no acute distress HEENT: normal Neck: no JVD, no masses. No carotid bruits Cardiac: RRR without murmur or gallop                Respiratory:  clear to auscultation bilaterally, normal work of breathing GI: soft, nontender, nondistended, + BS MS: no deformity or atrophy Ext: no pretibial edema, pedal pulses 2+= bilaterally Skin: warm and dry, no rash Neuro:  Strength and sensation are intact Psych: euthymic mood, full affect  EKG:  EKG is ordered today. The ekg ordered today shows normal sinus rhythm 68 bpm, within normal limits.  Recent Labs: 05/06/2015: ALT 37   Lipid Panel     Component Value Date/Time   CHOL 143 11/19/2014 0737   TRIG 201.0* 11/19/2014 0737   HDL 33.60* 11/19/2014 0737   CHOLHDL 4 11/19/2014 0737   VLDL 40.2* 11/19/2014 0737   LDLCALC 100* 09/17/2014 0005   LDLDIRECT 68.0 11/19/2014 0737      Wt Readings from Last 3 Encounters:  01/02/16 170 lb (77.111 kg)  05/06/15 173 lb (78.472 kg)  10/25/14 182 lb 6.4 oz (82.736 kg)     Cardiac Studies Reviewed: Cath 10-03-2014 HEMODYNAMICS: Aortic pressure was 114/70; LV pressure was 115/4; LVEDP 14. There was no gradient between the left ventricle and aorta.   ANGIOGRAPHIC DATA: The left main coronary artery is patent with mild disease.  The left anterior descending artery is occluded proximally. The LAD fills from a large, patent LIMA graft. There is not much retrograde flow in the LAD but the antegrade flow reaches the apex.  The LIMA to LAD is widely patent. The SVG to diagonal is widely patent. There is moderate, calcific disease in the lower pole of this large diagonal.  The left circumflex artery is a large vessel. In the mid circumflex, there is an 80% lesion. In the proximal OM 2, there is a calcified 60-70% lesion. The remainder of the AV groove circumflex is small.  The SVG to OM is occluded.  The right coronary artery is a  very large, dominant vessel. There is mild disease in the proximal vessel. In the mid vessel, there is a calcific severe lesion, up to 80%. The distal RCA has some mild atherosclerotic disease. The posterior descending artery is medium sized and widely patent. The posterior lateral artery is large and widely patent.  The SVG to RCA is occluded.  LEFT VENTRICULOGRAM: Left ventricular angiogram was not done. LVEDP was 14 mmHg.  PCI NARRATIVE: A CLS 3.5 guiding catheter was used to engage the left main. IV heparin was given. IV tirofiban was started. Plavix was given orally. A pro-water wire was placed  across the disease in the circumflex and OM 2. A 2.5 x 12 balloon was used to predilate. A 2.75 x 28 Synergy drug-eluting stent was then deployed. There was an under deployed area in the OM 2. The proximal area was postdilated with a 3.25 x 20 noncompliant balloon. The 3.25 balloon was advanced to the OM 2 and would still not successfully expand the stent even at high pressure. We then took a 3.5 x 12 balloon to post-dilate the OM 2 by itself. At 20 atm, the lesion finally gave. There was a significant step down from the stented area to the native vessel no evidence of dissection.  Initially, we had considered also intervening upon the RCA. There was actually more calcium visible in the RCA. Given this, I was hesitant to just try a balloon/stent technique. He will likely need atherectomy given the amount of calcium noted in the RCA.  IMPRESSIONS:  1. Severe three-vessel coronary disease as outlined above. Patent LIMA to LAD. Patent SVG to diagonal. Occluded SVG to OM. Occluded SVG to RCA system. 2. Successful PCI to the circumflex and OM 2 with a 2.75 x 28 Synergy drug-eluting stent, postdilated to greater than 3.5 mm in diameter. The lesion was quite calcific and required high pressure for full stent dilatation. 3. Left ventricular systolic function not assessed. LVEDP 14 mmHg.  RECOMMENDATION:  Continue dual antiplatelet therapy for at least a year. Continue IV tirofiban for a couple of hours. We'll plan on atherectomy on Thursday. He'll need aggressive secondary prevention as well.  When we do the atherectomy, will likely use right groin or right radial approach.  Cath 09-20-2014: ANGIOGRAPHIC DATA: The left main coronary artery is patent    The left circumflex artery is patent. The stent which extends from the proximal circumflex into the obtuse marginal is widely patent. There is a significant step down from the stent to the native OM vessel. There does not appear to be any dissection or any flow limitation at this point.  The right coronary artery is a large dominant vessel with a heavily calcified mid vessel lesion, up to 90%.  PCI NARRATIVE: A 7 [place] AR-1 with side holes guide catheter was used to engage the RCA. Heparin was used for anticoagulation. An ACT was used to check that the heparin was therapeutic. A pro-water wire was placed across the area disease into the distal RCA. A 3.0 x 15 over-the-wire balloon was used to switch out the pro-water wire for a Viper wire. The CSI atherectomy device was advanced. Multiple passes were performed at low speed. There was an improved angiographic result. The moderate disease in the more distal vessel also improved, even though this was not treated with the device. The 3.0 balloon was then used to predilate. It was inflated to 8 atm but inflation still was restricted. We went back with the CSI device. At high speed, 2 passes were performed in the more distal portion of the lesion. The device was removed. A pro-water was advanced across the disease Of the Viper for additional support. A 3.5 x 24 Synergy drug-eluting stent was then advanced and deployed at high pressure. The proximal portion of the stent was postdilated with a 4.0 x 12 noncompliant balloon. There was an excellent angiographic result. There was no residual stenosis.  Intracoronary nitroglycerin was given.  IMPRESSIONS:  4. Patent left main coronary artery. 5. Patent stent in the left circumflex artery and its branches. 6. Severe disease in the mid right coronary artery.  This was successfully treated with orbital atherectomy followed by a 3.5 x 24 Synergy drug-eluting stent, postdilated to greater than 4 mm in diameter.   RECOMMENDATION: Continue dual antiplatelet therapy for at least a year. Continue aggressive secondary prevention. He'll be watched overnight. Probable discharge home tomorrow.   ASSESSMENT AND PLAN: 1.  CAD, native vessel, with rare episodes of angina. The patient appears stable. His current medicines will be continued. I think it is best to keep him on antiplatelet therapy with aspirin and Plavix at this time considering his bypass graft disease and multivessel PCI history. No changes are made to his medical regimen today.  2. Hyperlipidemia: The patient is treated with atorvastatin 80 mg. Will update labs today. Last lab data reviewed and LDL is at goal.  3. Tobacco abuse: Smoking cessation counseling is done. He is unlikely to quit.  Current medicines are reviewed with the patient today.  The patient does not have concerns regarding medicines.  Labs/ tests ordered today include:  No orders of the defined types were placed in this encounter.    Disposition:   FU one year  Signed, Sherren Mocha, MD  01/02/2016 8:11 AM    [facility] [address], [place], [place]  [address] Phone: [phone]; Fax: [phone]

## 2016-01-03 LAB — HEPATIC FUNCTION PANEL
ALBUMIN: 4.1 g/dL (ref 3.6–5.1)
ALK PHOS: 62 U/L (ref 40–115)
ALT: 15 U/L (ref 9–46)
AST: 18 U/L (ref 10–35)
BILIRUBIN DIRECT: 0.2 mg/dL (ref <=0.2)
BILIRUBIN INDIRECT: 0.5 mg/dL (ref 0.2–1.2)
BILIRUBIN TOTAL: 0.7 mg/dL (ref 0.2–1.2)
Total Protein: 6.5 g/dL (ref 6.1–8.1)

## 2016-01-03 LAB — LIPID PANEL
Cholesterol: 144 mg/dL (ref 125–200)
HDL: 46 mg/dL (ref >=40)
LDL CALC: 78 mg/dL (ref <130)
Total CHOL/HDL Ratio: 3.1 Ratio (ref <=5.0)
Triglycerides: 102 mg/dL (ref <150)
VLDL: 20 mg/dL (ref <30)

## 2016-03-24 ENCOUNTER — Other Ambulatory Visit: Payer: Self-pay | Admitting: Cardiovascular Disease

## 2016-04-30 ENCOUNTER — Other Ambulatory Visit: Payer: Self-pay | Admitting: Cardiovascular Disease

## 2016-05-17 ENCOUNTER — Encounter (HOSPITAL_COMMUNITY): Payer: Self-pay

## 2016-05-17 ENCOUNTER — Emergency Department (HOSPITAL_COMMUNITY)
Admission: EM | Admit: 2016-05-17 | Discharge: 2016-05-17 | Disposition: A | Discharge status: Home / Self Care | Payer: Medicare HMO | Attending: Emergency Medicine | Admitting: Emergency Medicine

## 2016-05-17 DIAGNOSIS — Z7982 Long term (current) use of aspirin: Secondary | ICD-10-CM | POA: Insufficient documentation

## 2016-05-17 DIAGNOSIS — M545 Low back pain, unspecified: Secondary | ICD-10-CM

## 2016-05-17 DIAGNOSIS — Z951 Presence of aortocoronary bypass graft: Secondary | ICD-10-CM | POA: Insufficient documentation

## 2016-05-17 DIAGNOSIS — I2510 Atherosclerotic heart disease of native coronary artery without angina pectoris: Secondary | ICD-10-CM | POA: Diagnosis not present

## 2016-05-17 DIAGNOSIS — G8929 Other chronic pain: Secondary | ICD-10-CM | POA: Insufficient documentation

## 2016-05-17 DIAGNOSIS — Z87891 Personal history of nicotine dependence: Secondary | ICD-10-CM | POA: Diagnosis not present

## 2016-05-17 MED ORDER — IBUPROFEN 600 MG PO TABS: 600.0000 mg | ORAL_TABLET | Freq: Four times a day (QID) | ORAL | 0 refills | Status: DC | PRN

## 2016-05-17 MED ORDER — METHOCARBAMOL 500 MG PO TABS: 500.0000 mg | ORAL_TABLET | Freq: Two times a day (BID) | ORAL | 0 refills | Status: DC

## 2016-05-17 NOTE — ED Notes (Signed)
Bed: WTR5 Expected date:  Expected time:  Means of arrival:  Comments: 

## 2016-05-17 NOTE — ED Triage Notes (Signed)
PT C/O CHRONIC LOWER BACK PAIN FOR "A WHILE". PT DENIES INJURY, BUT HAS NUMBNESS DOWN BOTH LEGS. DENIES URINARY SYMPTOMS.

## 2016-05-17 NOTE — ED Provider Notes (Signed)
[facility] DEPT Provider Note   CSN: [id number] Arrival date & time: 05/17/16  1247   By signing my name below, I, Maren Reamer, attest that this documentation has been prepared under the direction and in the presence of Montine Circle, PA-C. Electronically Signed: Maren Reamer, Scribe. 05/17/16. 1:41 PM.    History   Chief Complaint Chief Complaint  Patient presents with  . Back Pain    The history is provided by the patient. No language interpreter was used.    HPI Comments: Derrick Stokes is a 65 y.o. male who presents to the Emergency Department complaining of stabbing low back pain that has been a chronic issue for him. It typically only lasts for a few days but this has lasted much longer. This specific exacerbation has lasted for about 3 weeks. Has seen Neurosurgery and was told that he has small nerve bundle damage. Has not done physical therapy in the past. Denies acute injury or urinary issues. Has associated numbness down both of his legs. Aleve has improved his symptoms. Denies urinary or fecal incontinence. Is set up to see his PCP late next month and is looking for some pain relief until then. Would like a referral for Neurosurgery.  Denies known history of cancer.   Past Medical History:  Diagnosis Date  . Arthritis   . B12 deficiency    Borderline  . CAD (coronary artery disease)    a. s/p CABG 01/2011;  b. ETT Myoview 3/14:  Low risk;  c. 09/2014 Cath/PCI: LM nl, LAD 100p, LCX 18m/OM2 60-70p (2.75x28 Synergy DES), RCA dom, 85m (atherectomy, 3.5x24 Synergy DES), PDA nl, LIMA->LAD nl, VG->Diag nl, VG->OM 100, VG->RCA 100.  [name] Hiatal hernia   . History of echocardiogram    Echo 10/16: EF 50-55%, no RWMA, Gr 1 DD, mild MR, mild TR  . HLD (hyperlipidemia)   . Kidney stones   . Leg pain    ABIs 4/14:  R 1.2, L 1.2, TBIs normal  . Peripheral neuropathy ([place])    From trauma  . Tobacco abuse     Patient Active Problem List   Diagnosis Date Noted  . CAD  (coronary artery disease), native coronary artery 09/17/2014  . Unstable angina ([place]) 09/16/2014  . Tobacco abuse 01/03/2014  . S/P coronary artery bypass graft x 5 03/06/2011  . Hyperlipidemia 03/06/2011  . HELICOBACTER PYLORI INFECTION 11/05/2008  . BARRETTS ESOPHAGUS 11/05/2008  . HIATAL HERNIA 11/05/2008  . DIVERTICULOSIS, COLON 11/05/2008  . COLONIC POLYPS, HX OF 11/05/2008  . GASTRITIS 10/22/2008  . ANXIETY 10/12/2008  . EARLY SATIETY 10/12/2008  . CHANGE IN BOWELS 10/12/2008  . ABDOMINAL PAIN -GENERALIZED 10/12/2008  . ABDOMINAL PAIN, LEFT UPPER QUADRANT 10/01/2008    Past Surgical History:  Procedure Laterality Date  . COLONOSCOPY     Neg  . CORONARY ARTERY BYPASS GRAFT    . EPIDURAL STEROIDS    . LEFT HEART CATHETERIZATION WITH CORONARY/GRAFT ANGIOGRAM N/A 09/18/2014   Procedure: LEFT HEART CATHETERIZATION WITH [name];  Surgeon: [name], MD;  Location: [facility] LAB;  Service: Cardiovascular;  Laterality: N/A;  . PERCUTANEOUS CORONARY ROTOBLATOR INTERVENTION (PCI-R) N/A 09/20/2014   Procedure: PERCUTANEOUS CORONARY ROTOBLATOR INTERVENTION (PCI-R);  Surgeon: [name], MD;  Location: [facility] C[facility] LAB;  Service: Cardiovascular;  Laterality: N/A;       Home Medications    Prior to Admission medications   Medication Sig Start Date End Date Taking? Authorizing Provider  aspirin EC 81 MG tablet Take 81  mg by mouth at bedtime.     Historical Provider, MD  atorvastatin (LIPITOR) 80 MG tablet Take 1 tablet (80 mg total) by mouth daily at 6 PM. 05/06/15   Liliane Shi, PA-C  clopidogrel (PLAVIX) 75 MG tablet TAKE 1 TABLET BY MOUTH EVERY DAY WITH BREAKFAST 04/30/16   Sherren Mocha, MD  ibuprofen (ADVIL,MOTRIN) 600 MG tablet Take 1 tablet (600 mg total) by mouth every 6 (six) hours as needed. 05/17/16   Montine Circle, PA-C  methocarbamol (ROBAXIN) 500 MG tablet Take 1 tablet (500 mg total) by mouth 2 (two) times daily. 05/17/16   Montine Circle, PA-C  metoprolol tartrate (LOPRESSOR) 25 MG tablet Take 0.5 tablets (12.5 mg total) by mouth 2 (two) times daily. 12/10/15   Sherren Mocha, MD  nitroGLYCERIN (NITROSTAT) 0.4 MG SL tablet Place 1 tablet (0.4 mg total) under the tongue every 5 (five) minutes x 3 doses as needed for chest pain. 09/21/14   Rogelia Mire, NP  pantoprazole (PROTONIX) 40 MG tablet TAKE 1 TABLET BY MOUTH EVERY DAY 03/25/16   Sherren Mocha, MD    Family History Family History  Problem Relation Age of Onset  . Diabetes Father   . Heart attack Father 30  . Cancer Mother     Social History Social History  Substance Use Topics  . Smoking status: Former Smoker    Packs/day: 1.00    Years: 45.00    Types: Cigarettes    Quit date: 10/01/2014  . Smokeless tobacco: Former Systems developer  . Alcohol use 2.4 oz/week    4 Cans of beer per week     Comment: occ     Allergies   Iodine; Iohexol; and Shrimp [shellfish allergy]   Review of Systems Review of Systems  Constitutional: Negative for chills and fever.  Gastrointestinal:       No fecal incontinence  Genitourinary: Negative.        No urinary incontinence  Musculoskeletal: Positive for arthralgias, back pain and myalgias.  Neurological:       No saddle anesthesia     Physical Exam Updated Vital Signs BP 118/79 (BP Location: Right Arm)   Pulse 66   Temp 97.9 F (36.6 C) (Oral)   Resp 18   Ht 5\' 8"  (1.727 m)   Wt 171 lb (77.6 kg)   SpO2 99%   BMI 26.00 kg/m   Physical Exam Physical Exam  Constitutional: Pt appears well-developed and well-nourished. No distress.  HENT:  Head: Normocephalic and atraumatic.  Mouth/Throat: Oropharynx is clear and moist. No oropharyngeal exudate.  Eyes: Conjunctivae are normal.  Neck: Normal range of motion. Neck supple.  No meningismus Cardiovascular: Normal rate, regular rhythm and intact distal pulses.   Pulmonary/Chest: Effort normal and breath sounds normal. No respiratory distress. Pt has no  wheezes.  Abdominal: Pt exhibits no distension Musculoskeletal:  Lumbar paraspinal muscles tender to palpation, no bony CTLS spine tenderness, deformity, step-off, or crepitus Lymphadenopathy: Pt has no cervical adenopathy.  Neurological: Pt is alert and oriented Speech is clear and goal oriented, follows commands Normal 5/5 strength in upper and lower extremities bilaterally including dorsiflexion and plantar flexion, strong and equal grip strength Sensation intact Great toe extension intact Moves extremities without ataxia, coordination intact  Normal gait Normal balance No Clonus Skin: Skin is warm and dry. No rash noted. Pt is not diaphoretic. No erythema.  Psychiatric: Pt has a normal mood and affect. Behavior is normal.  Nursing note and vitals reviewed.   ED  Treatments / Results  DIAGNOSTIC STUDIES: Oxygen Saturation is 99% on RA, normal by my interpretation.    COORDINATION OF CARE: 1:32 PM Discussed treatment plan with pt at bedside and pt agreed to plan.  Labs (all labs ordered are listed, but only abnormal results are displayed) Labs Reviewed - No data to display  EKG  EKG Interpretation None       Radiology No results found.  Procedures Procedures (including critical care time)  Medications Ordered in ED Medications - No data to display   Initial Impression / Assessment and Plan / ED Course  I have reviewed the triage vital signs and the nursing notes.  Pertinent labs & imaging results that were available during my care of the patient were reviewed by me and considered in my medical decision making (see chart for details).  Clinical Course    Patient with back pain.  No neurological deficits and normal neuro exam.  Patient is ambulatory.  No loss of bowel or bladder control.  Doubt cauda equina.  Denies fever,  doubt epidural abscess or other lesion. Recommend back exercises, stretching, RICE, and will treat with a short course of  robaxin.  Encouraged the patient that there could be a need for additional workup and/or imaging such as MRI, if the symptoms do not resolve. Patient advised that if the back pain does not resolve, or radiates, this could progress to more serious conditions and is encouraged to follow-up with PCP or orthopedics within 2 weeks.     Final Clinical Impressions(s) / ED Diagnoses   Final diagnoses:  Chronic low back pain without sciatica, unspecified back pain laterality   I personally performed the services described in this documentation, which was scribed in my presence. The recorded information has been reviewed and is accurate.     New Prescriptions New Prescriptions   IBUPROFEN (ADVIL,MOTRIN) 600 MG TABLET    Take 1 tablet (600 mg total) by mouth every 6 (six) hours as needed.   METHOCARBAMOL (ROBAXIN) 500 MG TABLET    Take 1 tablet (500 mg total) by mouth 2 (two) times daily.     Montine Circle, PA-C 05/17/16 [place], MD 05/17/16 [phone]

## 2016-05-27 DIAGNOSIS — M5416 Radiculopathy, lumbar region: Secondary | ICD-10-CM | POA: Diagnosis not present

## 2016-05-28 ENCOUNTER — Other Ambulatory Visit: Payer: Self-pay | Admitting: Physician Assistant

## 2016-05-28 DIAGNOSIS — E785 Hyperlipidemia, unspecified: Secondary | ICD-10-CM

## 2016-06-08 ENCOUNTER — Encounter: Payer: Self-pay | Admitting: Family Medicine

## 2016-06-08 ENCOUNTER — Ambulatory Visit (INDEPENDENT_AMBULATORY_CARE_PROVIDER_SITE_OTHER): Payer: Medicare HMO | Admitting: Family Medicine

## 2016-06-08 VITALS — BP 128/80 | HR 68 | Resp 12 | Ht 68.0 in | Wt 175.5 lb

## 2016-06-08 DIAGNOSIS — M545 Low back pain, unspecified: Secondary | ICD-10-CM

## 2016-06-08 DIAGNOSIS — I2510 Atherosclerotic heart disease of native coronary artery without angina pectoris: Secondary | ICD-10-CM

## 2016-06-08 DIAGNOSIS — Z23 Encounter for immunization: Secondary | ICD-10-CM

## 2016-06-08 DIAGNOSIS — M792 Neuralgia and neuritis, unspecified: Secondary | ICD-10-CM | POA: Insufficient documentation

## 2016-06-08 DIAGNOSIS — K219 Gastro-esophageal reflux disease without esophagitis: Secondary | ICD-10-CM

## 2016-06-08 DIAGNOSIS — M79604 Pain in right leg: Secondary | ICD-10-CM

## 2016-06-08 DIAGNOSIS — R200 Anesthesia of skin: Secondary | ICD-10-CM

## 2016-06-08 DIAGNOSIS — R202 Paresthesia of skin: Secondary | ICD-10-CM | POA: Diagnosis not present

## 2016-06-08 DIAGNOSIS — E538 Deficiency of other specified B group vitamins: Secondary | ICD-10-CM | POA: Insufficient documentation

## 2016-06-08 DIAGNOSIS — Z720 Tobacco use: Secondary | ICD-10-CM

## 2016-06-08 DIAGNOSIS — G8929 Other chronic pain: Secondary | ICD-10-CM | POA: Insufficient documentation

## 2016-06-08 LAB — HEMOGLOBIN A1C: Hgb A1c MFr Bld: 5.5 % (ref 4.6–6.5)

## 2016-06-08 LAB — VITAMIN B12: VITAMIN B 12: 149 pg/mL — AB (ref 211–911)

## 2016-06-08 LAB — BASIC METABOLIC PANEL
BUN: 7 mg/dL (ref 6–23)
CALCIUM: 9.6 mg/dL (ref 8.4–10.5)
CHLORIDE: 101 meq/L (ref 96–112)
CO2: 28 meq/L (ref 19–32)
CREATININE: 0.82 mg/dL (ref 0.40–1.50)
GFR: 100.63 mL/min (ref >60.00)
GLUCOSE: 86 mg/dL (ref 70–99)
Potassium: 4.5 mEq/L (ref 3.5–5.1)
Sodium: 136 mEq/L (ref 135–145)

## 2016-06-08 NOTE — Progress Notes (Signed)
Pre visit review using our clinic review tool, if applicable. No additional management support is needed unless otherwise documented below in the visit note. 

## 2016-06-08 NOTE — Progress Notes (Signed)
HPI:   [name] is a 65 y.o. male, who is here today with his wife to establish care with me.  Former PCP: Dr Derrick Stokes. Last preventive routine visit: 2015.  Concerns today: none.  Hx of CAD, s/p CABG in 2014 and stenting in 2016. Also Hx of hyperlipidemia and tobacco abuse.  He follows with Dr Derrick Stokes, cardiologists, last seen 12/2015 and follows annually. He is on Metoprolol 12.5 mg bid and Lipitor 80 mg daily.  Lab Results  Component Value Date   CHOL 144 01/02/2016   HDL 46 01/02/2016   LDLCALC 78 01/02/2016   LDLDIRECT 68.0 11/19/2014   TRIG 102 01/02/2016   CHOLHDL 3.1 01/02/2016   Denies severe/frequent headache, visual changes, chest pain, dyspnea, palpitation, claudication, focal weakness, or edema. Sleeps on 2 pillows because it is more comfortable for his neck.  GERD/Hiatal hernia: Since 2016 he has been on Pantoprazole, which has helped with heartburn. Reporting colonoscopy as up to date.   Denies abdominal pain, nausea, vomiting, changes in bowel habits, blood in stool or melena.   Smoker since 65 years old, has decreased cig/day, now less that a PPD.Before 2 PPD. Not aware of Hx of COPD, chronic intermittent cough, which resolved after quitting cig x 6 months. Recovering from recent URI, denies fever,chills, or increased in sputum production. He has not noted wheezing or exertional dyspnea.   -He was in the ER on 05/17/16 because lower back pain, which is been going on for many years. Pain is severe sometimes, readiated to LE, + constant numbness and tingling on LE, R>L. Also reporting decreased sensation on anterior aspect of right thigh, chronic and Stokes. He denies suicidal anesthesia, urine/bowel incontinence, or worsening of radicular symptoms. Currently he is not taking medication for back pain or radicular pain. Pain is exacerbated by prolonged sitting, walking, and standing. Alleviated somewhat by changing positions and rest.  He  is following with neurosurgeon, next appt 06/17/16 with Dr Derrick Stokes. According to wife, lumbar MRI was denied by insurance.   -He lives wife, 2 adult children, and 2 grandchildren. He does not drive because radicular pain.   Independent ADL's and most of IADL's. No falls in the past year and denies depression symptoms.Wife reports some depressed mood after heart surgery but resolved after he was able to go back to his normal daily activities.   He also would like to be screened for diabetes, wife has history of DM to and occasionally checks his blood sugar, reporting "good" numbers. He is concerned that numbness on lower extremities and the associated with diabetes. History of B12 deficiency, currently he is not on B12 supplementation. He has not had B12 checked in years.  Lab Results  Component Value Date   HGBA1C 5.2 01/29/2011     Review of Systems  Constitutional: Negative for activity change, appetite change, fatigue, fever and unexpected weight change.  HENT: Positive for postnasal drip and rhinorrhea. Negative for nosebleeds, sore throat and trouble swallowing.   Eyes: Negative for pain and visual disturbance.  Respiratory: Positive for cough. Negative for apnea, shortness of breath and wheezing.   Cardiovascular: Negative for chest pain, palpitations and leg swelling.  Gastrointestinal: Negative for abdominal pain, nausea and vomiting.       No changes in bowel habits.  Endocrine: Negative for polydipsia, polyphagia and polyuria.  Genitourinary: Negative for decreased urine volume, difficulty urinating and hematuria.  Musculoskeletal: Positive for back pain.  Skin: Negative for color change and rash.  Neurological: Positive for numbness. Negative for dizziness, syncope, weakness and headaches.  Psychiatric/Behavioral: Negative for confusion and sleep disturbance. The patient is not nervous/anxious.       Current Outpatient Prescriptions on File Prior to Visit  Medication  Sig Dispense Refill  . aspirin EC 81 MG tablet Take 81 mg by mouth at bedtime.     [name] atorvastatin (LIPITOR) 80 MG tablet TAKE 1 TABLET BY MOUTH EVERY DAY AT 6PM 90 tablet 3  . clopidogrel (PLAVIX) 75 MG tablet TAKE 1 TABLET BY MOUTH EVERY DAY WITH BREAKFAST 90 tablet 3  . metoprolol tartrate (LOPRESSOR) 25 MG tablet Take 0.5 tablets (12.5 mg total) by mouth 2 (two) times daily. 90 tablet 1  . nitroGLYCERIN (NITROSTAT) 0.4 MG SL tablet Place 1 tablet (0.4 mg total) under the tongue every 5 (five) minutes x 3 doses as needed for chest pain. 25 tablet 3  . pantoprazole (PROTONIX) 40 MG tablet TAKE 1 TABLET BY MOUTH EVERY DAY 90 tablet 2   No current facility-administered medications on file prior to visit.      Past Medical History:  Diagnosis Date  . Arthritis   . B12 deficiency    Borderline  . CAD (coronary artery disease)    a. s/p CABG 01/2011;  b. ETT Myoview 3/14:  Low risk;  c. 09/2014 Cath/PCI: LM nl, LAD 100p, LCX 59m/OM2 60-70p (2.75x28 Synergy DES), RCA dom, 28m (atherectomy, 3.5x24 Synergy DES), PDA nl, LIMA->LAD nl, VG->Diag nl, VG->OM 100, VG->RCA 100.  [name] Hiatal hernia   . History of echocardiogram    Echo 10/16: EF 50-55%, no RWMA, Gr 1 DD, mild MR, mild TR  . HLD (hyperlipidemia)   . Kidney stones   . Leg pain    ABIs 4/14:  R 1.2, L 1.2, TBIs normal  . Peripheral neuropathy ([place])    From trauma  . Tobacco abuse    Allergies  Allergen Reactions  . Iodine Nausea And Vomiting    Turns white in face and sweaty  . Iohexol Other (See Comments)    May have caused nausea and vomiting several years ago   . Shrimp [Shellfish Allergy] Nausea And Vomiting    Turns white in face and sweaty    Family History  Problem Relation Age of Onset  . Diabetes Father   . Heart attack Father 24  . Heart disease Father     CAD  . Cancer Mother     Social History   Social History  . Marital status: Married    Spouse name: N/A  . Number of children: N/A  . Years of education:  N/A   Occupational History  . [employer]ability    Social History Main Topics  . Smoking status: Current Every Day Smoker    Packs/day: 1.00    Years: 45.00    Types: Cigarettes    Last attempt to quit: 10/01/2014  . Smokeless tobacco: Former Systems developer  . Alcohol use 2.4 oz/week    4 Cans of beer per week     Comment: occ  . Drug use: No  . Sexual activity: Yes   Other Topics Concern  . None   Social History Narrative   Married   Gets regular exercise - walks 1/4 mile per day 5 times a week without symptoms    Vitals:   06/08/16 0849  BP: 128/80  Pulse: 68  Resp: 12   O2 sat 92% at RA.  Body mass index is 26.68 kg/m.  Physical Exam  Nursing note and vitals reviewed. Constitutional: He is oriented to person, place, and time. He appears well-developed. No distress.  HENT:  Head: Atraumatic.  Mouth/Throat: Oropharynx is clear and moist and mucous membranes are normal.  Eyes: Conjunctivae and EOM are normal. Pupils are equal, round, and reactive to light.  Neck: No JVD present. No thyroid mass and no thyromegaly present.  Cardiovascular: Normal rate and regular rhythm.   No murmur heard. Pulses:      Dorsalis pedis pulses are 2+ on the right side, and 2+ on the left side.  Respiratory: Effort normal and breath sounds normal. No respiratory distress.  GI: Soft. He exhibits no mass. There is no hepatomegaly. There is no tenderness.  Musculoskeletal: He exhibits no edema.  Pain upon palpation left paraspinal muscle, L3-5.  Neurological: He is alert and oriented to person, place, and time. He has normal strength. Coordination normal.  Reflex Scores:      Patellar reflexes are 2+ on the right side and 2+ on the left side. SLR negative bilateral. Stokes ,antalgic gait with no assistance.  Skin: Skin is warm. No erythema.  Psychiatric: He has a normal mood and affect. Cognition and memory are normal.  Well groomed, good eye contact.      ASSESSMENT AND  PLAN:     Alquan was seen today for establish care.  Diagnoses and all orders for this visit:   Low back pain radiating to both legs  Chronic. Some treatment options discussed. He will continue following with the neurosurgeon.  Numbness and tingling of both lower extremities  Most likely related to radicular pain/ peripheral neuropathy. Fall precautions discussed.  His currently following with neurosurgeon, hopefully he can have lumbar MRI scheduled. Some treatment options as Cymbalta, gabapentin, on Lyrica discussed. Follow-up in 3 months, when we can determine the need of pharmacologic treatment depending on neurosurgeon recommendations.  -     Basic Metabolic Panel -     Hemoglobin A1c  B12 deficiency  For the recommendations would be given according to lab results.  -     Vitamin B12  Gastroesophageal reflux disease, esophagitis presence not specified  Well controlled. Continue current management. GERD precautions discussed, encouraged smoking cessation.  Coronary artery disease involving native coronary artery of native heart without angina pectoris  Reporting no symptoms. Continue current management and following with Dr. Burt Stokes. Smoking cessation is strongly recommended.  -     Basic Metabolic Panel  Tobacco abuse  ? COPD. Some adverse effects of tobacco use discussed. He is not interested in smoking cessation for now.  Need for immunization against influenza -     Flu Vaccine QUAD 36+ mos IM  Need for 23-polyvalent pneumococcal polysaccharide vaccine -     Pneumococcal polysaccharide vaccine 23-valent greater than or equal to 2yo subcutaneous/IM       Alyha Marines G. [name], MD  [facility]. [place] office.

## 2016-06-08 NOTE — Patient Instructions (Addendum)
A few things to remember from today's visit:   Coronary artery disease involving native coronary artery of native heart without angina pectoris - Plan: Basic Metabolic Panel  123456 deficiency - Plan: Vitamin B12  Gastroesophageal reflux disease, esophagitis presence not specified  Tobacco abuse  Numbness and tingling of both lower extremities - Plan: Basic Metabolic Panel, Hemoglobin A1c    Avoid foods that make your symptoms worse, for example coffee, chocolate,pepermeint,alcohol, and greasy food. Raising the head of your bed about 6 inches may help with nocturnal symptoms.  Avoid tobacco use. Weight loss (if you are overweight). Avoid lying down for 3 hours after eating.  Instead 3 large meals daily try small and more frequent meals during the day.   You should be evaluated immediately if bloody vomiting, bloody stools, black stools (like tar), difficulty swallowing, food gets stuck on the way down or choking when eating. Abnormal weight loss or severe abdominal pain.  For back pain with leg numbness options are: Cymbalta,Gabapentin,and Lyrica.   Medicare covers a annual preventive visit, which is strongly recommended , it is once per year and involves a series of questions to identify risk factors; so we can try to prevent possible complications. This does not need to be done by a doctor.  We have a nurse Investment banker, corporate) here that is highly qualified to do it, it can be arrange same date you have a follow up appointment with me or labs scheduled, and it 100% covered by Medicare. So before you leave today I would like for you to arrange visit with Ms Wynetta Fines for Medicare wellness visit.   Please be sure medication list is accurate. If a new problem present, please set up appointment sooner than planned today.

## 2016-06-09 ENCOUNTER — Encounter: Payer: Self-pay | Admitting: Family Medicine

## 2016-06-25 DIAGNOSIS — H40011 Open angle with borderline findings, low risk, right eye: Secondary | ICD-10-CM | POA: Diagnosis not present

## 2016-06-25 DIAGNOSIS — H40012 Open angle with borderline findings, low risk, left eye: Secondary | ICD-10-CM | POA: Diagnosis not present

## 2016-06-25 DIAGNOSIS — Z961 Presence of intraocular lens: Secondary | ICD-10-CM | POA: Diagnosis not present

## 2016-06-27 ENCOUNTER — Other Ambulatory Visit: Payer: Self-pay | Admitting: Cardiovascular Disease

## 2016-09-08 ENCOUNTER — Ambulatory Visit: Payer: Medicare HMO | Admitting: Family Medicine

## 2016-09-08 ENCOUNTER — Ambulatory Visit (INDEPENDENT_AMBULATORY_CARE_PROVIDER_SITE_OTHER): Payer: Medicare HMO | Admitting: Family Medicine

## 2016-09-08 ENCOUNTER — Encounter: Payer: Self-pay | Admitting: Family Medicine

## 2016-09-08 VITALS — BP 118/80 | HR 64 | Resp 12 | Ht 68.0 in | Wt 174.5 lb

## 2016-09-08 DIAGNOSIS — R0989 Other specified symptoms and signs involving the circulatory and respiratory systems: Secondary | ICD-10-CM | POA: Diagnosis not present

## 2016-09-08 DIAGNOSIS — J449 Chronic obstructive pulmonary disease, unspecified: Secondary | ICD-10-CM | POA: Insufficient documentation

## 2016-09-08 DIAGNOSIS — M7702 Medial epicondylitis, left elbow: Secondary | ICD-10-CM | POA: Diagnosis not present

## 2016-09-08 DIAGNOSIS — E538 Deficiency of other specified B group vitamins: Secondary | ICD-10-CM | POA: Diagnosis not present

## 2016-09-08 DIAGNOSIS — M545 Low back pain: Secondary | ICD-10-CM | POA: Diagnosis not present

## 2016-09-08 DIAGNOSIS — J441 Chronic obstructive pulmonary disease with (acute) exacerbation: Secondary | ICD-10-CM | POA: Insufficient documentation

## 2016-09-08 DIAGNOSIS — J989 Respiratory disorder, unspecified: Secondary | ICD-10-CM

## 2016-09-08 DIAGNOSIS — M79604 Pain in right leg: Secondary | ICD-10-CM

## 2016-09-08 MED ORDER — CYANOCOBALAMIN 1000 MCG/ML IJ SOLN
1000.0000 ug | Freq: Once | INTRAMUSCULAR | Status: AC
  Administered 2016-09-08: 1000 ug via INTRAMUSCULAR

## 2016-09-08 MED ORDER — ALBUTEROL SULFATE HFA 108 (90 BASE) MCG/ACT IN AERS: 2.0000 | INHALATION_SPRAY | Freq: Four times a day (QID) | RESPIRATORY_TRACT | 1 refills | Status: DC | PRN

## 2016-09-08 NOTE — Progress Notes (Signed)
HPI:   [name] is a 66 y.o. male, who is here today with her wife to follow on last OV. I saw him last on 06/08/16, when he was c/o lower back pain. He has years Hx of lower back pain with associated LE numbness, usually he has episodes that resolved in 2-3 weeks but last OV he was concerned because it was not improving. He has followed with neurosurgeon Dr Trenton Gammon, who ordered lumbar MRI was ordered but not approved by his insurance company. Pain has resolved.According to pt, after last OV pain spontaneously improved and resolved.  -B12 deficiency: 06/08/16 B12 low at 146. He has not ben able to come to have B12 injections and has not started OTC B12.  -Still smoking.  Today I heard some prolonged expiration on auscultation and secretion movement with cough. He is reporting occasional wheezing and cough, no more than usual. He has an inhaler at home that has not used in months. Smoking 1/2 PPD. CXR 08/2014 Stable bronchitis changes.   Hx of CAD, s/p CABG and stenting. He follows with Dr Burt Knack, next appt 12/2016, he follows annually.  Today he is c/o left medial elbow pain for years but seems like it is getting worse. He denies Hx of trauma, no limitation of ROM, no erythema. Area seems more prominent, pain is moderate,exacerbated by bumping area accidentally or while in bed depending on position. He has not tried OTC treatment. Pain seems to be getting worse. No numbness or tingling. Right handed.     Review of Systems  Constitutional: Negative for appetite change, fatigue, fever and unexpected weight change.  HENT: Negative for sore throat and trouble swallowing.   Respiratory: Positive for cough and wheezing. Negative for shortness of breath.   Cardiovascular: Negative for chest pain, palpitations and leg swelling.  Gastrointestinal: Negative for abdominal pain, nausea and vomiting.       No changes in bowel habits.  Genitourinary: Negative for decreased  urine volume and hematuria.  Musculoskeletal: Positive for arthralgias. Negative for back pain and gait problem.  Skin: Negative for rash.  Neurological: Negative for syncope, weakness and headaches.  Psychiatric/Behavioral: Negative for confusion. The patient is not nervous/anxious.       Current Outpatient Prescriptions on File Prior to Visit  Medication Sig Dispense Refill  . aspirin EC 81 MG tablet Take 81 mg by mouth at bedtime.     [name] atorvastatin (LIPITOR) 80 MG tablet TAKE 1 TABLET BY MOUTH EVERY DAY AT 6PM 90 tablet 3  . clopidogrel (PLAVIX) 75 MG tablet TAKE 1 TABLET BY MOUTH EVERY DAY WITH BREAKFAST 90 tablet 3  . metoprolol tartrate (LOPRESSOR) 25 MG tablet TAKE 1/2 TABLET BY MOUTH TWICE A DAY 90 tablet 1  . nitroGLYCERIN (NITROSTAT) 0.4 MG SL tablet Place 1 tablet (0.4 mg total) under the tongue every 5 (five) minutes x 3 doses as needed for chest pain. 25 tablet 3  . pantoprazole (PROTONIX) 40 MG tablet TAKE 1 TABLET BY MOUTH EVERY DAY 90 tablet 2   No current facility-administered medications on file prior to visit.      Past Medical History:  Diagnosis Date  . Arthritis   . B12 deficiency    Borderline  . CAD (coronary artery disease)    a. s/p CABG 01/2011;  b. ETT Myoview 3/14:  Low risk;  c. 09/2014 Cath/PCI: LM nl, LAD 100p, LCX 25m/OM2 60-70p (2.75x28 Synergy DES), RCA dom, 87m (atherectomy, 3.5x24 Synergy DES), PDA nl,  LIMA->LAD nl, VG->Diag nl, VG->OM 100, VG->RCA 100.  [name] Hiatal hernia   . History of echocardiogram    Echo 10/16: EF 50-55%, no RWMA, Gr 1 DD, mild MR, mild TR  . HLD (hyperlipidemia)   . Kidney stones   . Leg pain    ABIs 4/14:  R 1.2, L 1.2, TBIs normal  . Peripheral neuropathy ([place])    From trauma  . Tobacco abuse    Allergies  Allergen Reactions  . Iodine Nausea And Vomiting    Turns white in face and sweaty  . Iohexol Other (See Comments)    May have caused nausea and vomiting several years ago   . Shrimp [Shellfish Allergy] Nausea  And Vomiting    Turns white in face and sweaty    Social History   Social History  . Marital status: Married    Spouse name: N/A  . Number of children: N/A  . Years of education: N/A   Occupational History  . [employer]ability    Social History Main Topics  . Smoking status: Current Every Day Smoker    Packs/day: 1.00    Years: 45.00    Types: Cigarettes    Last attempt to quit: 10/01/2014  . Smokeless tobacco: Former Systems developer  . Alcohol use 2.4 oz/week    4 Cans of beer per week     Comment: occ  . Drug use: No  . Sexual activity: Yes   Other Topics Concern  . None   Social History Narrative   Married   Gets regular exercise - walks 1/4 mile per day 5 times a week without symptoms    Vitals:   09/08/16 1550  BP: 118/80  Pulse: 64  Resp: 12  O2 sat 94% at RA. Body mass index is 26.53 kg/m.   Physical Exam  Nursing note and vitals reviewed. Constitutional: He is oriented to person, place, and time. He appears well-developed and well-nourished. No distress.  HENT:  Head: Atraumatic.  Mouth/Throat: Oropharynx is clear and moist and mucous membranes are normal. He has dentures.  Eyes: Conjunctivae and EOM are normal.  Cardiovascular: Normal rate and regular rhythm.   No murmur heard. Pulses:      Dorsalis pedis pulses are 2+ on the right side, and 2+ on the left side.  Respiratory: Effort normal and breath sounds normal. No respiratory distress. He has no rales.  Prolonged expiration, transmitted noises from upper airway that clear with coughing.   GI: Soft. He exhibits no mass. There is no hepatomegaly. There is no tenderness.  Musculoskeletal: He exhibits no edema.       Thoracic back: He exhibits no tenderness.       Lumbar back: He exhibits no tenderness.  Pain upon palpation left medial epicondyle. No deformity, it is more prominent than right one, no edema or erythema. No limitation elbow ROM.Pain is elicited with pronation or supination with resistance.    Lymphadenopathy:    He has no cervical adenopathy.  Neurological: He is alert and oriented to person, place, and time. He has normal strength. Coordination normal.  Stable gait with no assistance.  Skin: Skin is warm. No erythema.  Psychiatric: He has a normal mood and affect. Cognition and memory are normal.  Well groomed, good eye contact.      ASSESSMENT AND PLAN:   Derrick Stokes was seen today for follow-up.  Diagnoses and all orders for this visit:  Medial epicondylitis of elbow, left  We discussed treatment options, since there  is no Hx of direct trauma I do not think imaging is needed at this time but still offered. He prefers to hold on PT. OTC topical lidocaine path or icy hot with lidocaine may help. F/U as needed.  B12 deficiency  Today after verbal consent B12 1000 mcg given. He will continue oral B12 1000 mcg daily.  -     cyanocobalamin ((VITAMIN B-12)) injection 1,000 mcg; Inject 1 mL (1,000 mcg total) into the muscle once.  Reactive airway disease that is not asthma  ? COPD. Encouraged smoking cessation,adverse effects discussed. Albuterol inh 2 puff every 6 hours for a week then as needed for wheezing or shortness of breath.  Instructed about warning signs.  -     albuterol (PROVENTIL HFA;VENTOLIN HFA) 108 (90 Base) MCG/ACT inhaler; Inhale 2 puffs into the lungs every 6 (six) hours as needed for wheezing or shortness of breath.  Low back pain radiating to both legs  Currently asymptomatic. He will continue following with neurosurgeon. Try to avoid activities that may increase the risk of another exacerbation. Instructed about warning signs.    -[name] was advised to return sooner than planned today if new concerns arise.  He has an appt with cardiologists, Dr Burt Knack, who he sees periodically, so I think it is appropriate to follow with me annually,before if needed.     [name]. [name], MD  [facility]. [place]  office.

## 2016-09-08 NOTE — Progress Notes (Signed)
Pre visit review using our clinic review tool, if applicable. No additional management support is needed unless otherwise documented below in the visit note. 

## 2016-09-08 NOTE — Patient Instructions (Addendum)
A few things to remember from today's visit:   Medial epicondylitis of elbow, left  B12 deficiency   Please be sure medication list is accurate. If a new problem present, please set up appointment sooner than planned today.   Over the counter B12 1000 mcg daily. Appoitment with Dr Burt Knack in 12/2015 and I can see you 08/2018 as physical.      Golfer's Elbow Golfer's elbow, also called medial epicondylitis, is a condition that results from inflammation of the strong bands of tissue (tendons) that attach your forearm muscles to the inside of your bone at the elbow. These tendons affect the muscles that bend the palm toward the wrist (flexion). This condition is called golfer's elbow because it is more common among people who constantly bend and twist their wrists, such as golfers. This injury usually results from overuse. Tendons also become less flexible with age. This condition causes elbow pain that may spread to your forearm and upper arm. The pain may get worse when you bend your wrist downward. What are the causes? This condition is an overuse injury that is caused by:  Repeatedly flexing, turning, or twisting your wrist.  Constantly gripping objects with your hands. What increases the risk? This condition is more likely to develop in people who play golf or tennis or have jobs that require the constant use of their hands. This injury is more common among:  Carpenters.  Gardeners.  Musicians.  Bricklayers.  Typists. What are the signs or symptoms? Symptoms of this condition include:  Pain near the inner elbow or forearm.  Reduced grip strength. How is this diagnosed? This condition is diagnosed based on your symptoms, medical history, and physical exam. During the exam, your health care provider may test your grip strength and move your wrist to check for pain. You may also have an MRI to confirm the diagnosis, look for other issues, and check for tears in the  ligaments, muscles, or tendons. How is this treated? Treatment for this condition includes:  Stopping all activities that make you bend or twist your wrist until your pain and other symptoms go away.  Icing your wrist to relieve pain.  Taking NSAIDs or getting corticosteroid injections to reduce pain and swelling.  Doing stretches, range-of-motion, and strengthening exercises (physical therapy) as told by your health care provider. In rare cases, surgery may be needed if your condition does not improve. Follow these instructions at home:  If directed, apply ice to the injured area.  Put ice in a plastic bag.  Place a towel between your skin and the bag.  Leave the ice on for 20 minutes, 2-3 times a day.  Move your fingers often to avoid stiffness.  Raise (elevate) the injured area above the level of your heart while you are sitting or lying down.  Return to your normal activities as told by your health care provider. Ask your health care provider what activities are safe for you.  Do exercises as told by your health care provider.  Do not use tobacco products, including cigarettes, chewing tobacco, or e-cigarettes. If you need help quitting, ask your health care provider.  Take over-the-counter and prescription medicines only as told by your health care provider.  Keep all follow-up visits as told by your health care provider. This is important. How is this prevented?  Warm up and stretch before being active.  Cool down and stretch after being active.  Give your body time to rest between periods of activity.  Make sure to use equipment that fits you.  Be safe and responsible while being active to avoid falls.  Do at least 150 minutes of moderate-intensity exercise each week, such as brisk walking or water aerobics.  Maintain physical fitness, including:  Strength.  Flexibility.  Cardiovascular fitness.  Endurance.  Perform exercises to strengthen the forearm  muscles.  Slow your golf swing to reduce shock in the arm when making contact with the ball, if you play golf. Contact a health care provider if:  Your pain does not improve or it gets worse.  You notice numbness in your hand. Get help right away if:  Your pain is severe.  You cannot move your wrist. This information is not intended to replace advice given to you by your health care provider. Make sure you discuss any questions you have with your health care provider. Document Released: 07/06/2005 Document Revised: 03/10/2016 Document Reviewed: 03/18/2015 Elsevier Interactive Patient Education  2017 [facility] Ask your health care provider which exercises are safe for you. Do exercises exactly as told by your health care provider and adjust them as directed. It is normal to feel mild stretching, pulling, tightness, or discomfort as you do these exercises, but you should stop right away if you feel sudden pain or your pain gets worse. Do not begin these exercises until told by your health care provider. Stretching and range of motion exercises These exercises warm up your muscles and joints and improve the movement and flexibility of your elbow. Exercise A: Wrist flexors 1. Straighten your left / right elbow in front of you with your palm up. If told by your health care provider, do this stretch with your elbow bent rather than straight. 2. With your other hand, gently pull your left / right hand and fingers toward you until you feel a gentle stretch on the top of your forearm. 3. Hold this position for 15-30 seconds. Repeat 3 times. Complete this exercise __________ times a day. Strengthening exercises These exercises build strength and endurance in your elbow. Endurance is the ability to use your muscles for a long time, even after several repetitions. Exercise B: Wrist flexion 1. Sit with your left / right forearm palm-up and supported on a table or  other surface. 2. Let your left / right wrist extend over the edge of the surface. 3. Hold a __________ weight or a piece of rubber exercise band or tubing. Slowly curl your hand up toward your forearm. Try to only move your hand and keep the rest of your arm still. 4. Hold this position for __________ seconds. 5. Slowly return to the starting position. Repeat __________ times. Complete this exercise __________ times a day. Exercise C: Eccentric wrist flexion 1. Sit with your left / right forearm palm-up and supported on a table or other surface. 2. Let your left / right wrist extend over the edge of the surface. 3. Hold a __________ weight or a piece of rubber exercise band or tubing. 4. Use your other hand to move your left / right hand up toward your forearm. 5. Slowly return to the starting position using only your left / right hand. Repeat __________ times. Complete this exercise __________ times a day. Exercise D: Hand turns, pronation i 1. Sit with your left / right forearm supported on a table or other surface. 2. Move your wrist so your pinkie travels toward your forearm and your thumb moves away from your forearm.  3. Hold this position for __________ seconds. 4. Slowly return to the starting position. Exercise E: Hand turns, pronation ii 1. Sit with your left / right forearm supported on a table or other surface. 2. Hold a hammer in your left / right hand.  The exercise will be easier if you hold on near the head of the hammer.  If you hold on toward the end of the handle, the exercise will be harder. 3. Rest your left / right hand over the edge of the table with your palm facing up. 4. Without moving your elbow, slowly turn your palm and your hand down toward the table. 5. Hold this position for __________ seconds. 6. Slowly return to the starting position. Repeat __________ times. Complete this exercise __________ times a day. Exercise F: Shoulder blade squeeze 1. Sit in a  stable chair with good posture. Do not let your back touch the back of the chair. 2. Your arms should be at your sides with your elbows bent. You can rest your forearms on a pillow. 3. Squeeze your shoulder blades together. Keep your shoulders level. Do not lift your shoulders up toward your ears. 4. Hold this position for __________ seconds. 5. Slowly release. Repeat __________ times. Complete this exercise __________ times a day. This information is not intended to replace advice given to you by your health care provider. Make sure you discuss any questions you have with your health care provider. Document Released: 07/06/2005 Document Revised: 03/12/2016 Document Reviewed: 03/18/2015 Elsevier Interactive Patient Education  2017 [employer].

## 2016-10-19 DIAGNOSIS — Z7982 Long term (current) use of aspirin: Secondary | ICD-10-CM | POA: Diagnosis not present

## 2016-10-19 DIAGNOSIS — R062 Wheezing: Secondary | ICD-10-CM | POA: Diagnosis not present

## 2016-10-19 DIAGNOSIS — Z7902 Long term (current) use of antithrombotics/antiplatelets: Secondary | ICD-10-CM | POA: Diagnosis not present

## 2016-10-19 DIAGNOSIS — G629 Polyneuropathy, unspecified: Secondary | ICD-10-CM | POA: Diagnosis not present

## 2016-10-19 DIAGNOSIS — Z0000 Encounter for general adult medical examination without abnormal findings: Secondary | ICD-10-CM | POA: Diagnosis not present

## 2016-10-19 DIAGNOSIS — M545 Low back pain: Secondary | ICD-10-CM | POA: Diagnosis not present

## 2016-10-19 DIAGNOSIS — Z79891 Long term (current) use of opiate analgesic: Secondary | ICD-10-CM | POA: Diagnosis not present

## 2016-10-19 DIAGNOSIS — H9319 Tinnitus, unspecified ear: Secondary | ICD-10-CM | POA: Diagnosis not present

## 2016-10-19 DIAGNOSIS — R69 Illness, unspecified: Secondary | ICD-10-CM | POA: Diagnosis not present

## 2016-10-19 DIAGNOSIS — I25799 Atherosclerosis of other coronary artery bypass graft(s) with unspecified angina pectoris: Secondary | ICD-10-CM | POA: Diagnosis not present

## 2016-10-19 DIAGNOSIS — E785 Hyperlipidemia, unspecified: Secondary | ICD-10-CM | POA: Diagnosis not present

## 2016-10-19 DIAGNOSIS — M7702 Medial epicondylitis, left elbow: Secondary | ICD-10-CM | POA: Diagnosis not present

## 2016-10-19 DIAGNOSIS — Z791 Long term (current) use of non-steroidal anti-inflammatories (NSAID): Secondary | ICD-10-CM | POA: Diagnosis not present

## 2016-10-19 DIAGNOSIS — M25522 Pain in left elbow: Secondary | ICD-10-CM | POA: Diagnosis not present

## 2017-01-07 ENCOUNTER — Other Ambulatory Visit: Payer: Self-pay | Admitting: Cardiovascular Disease

## 2017-01-21 ENCOUNTER — Ambulatory Visit (INDEPENDENT_AMBULATORY_CARE_PROVIDER_SITE_OTHER): Payer: Medicare HMO | Admitting: Cardiovascular Disease

## 2017-01-21 ENCOUNTER — Encounter: Payer: Self-pay | Admitting: Cardiovascular Disease

## 2017-01-21 VITALS — BP 130/80 | HR 68 | Ht 68.0 in | Wt 177.8 lb

## 2017-01-21 DIAGNOSIS — E785 Hyperlipidemia, unspecified: Secondary | ICD-10-CM

## 2017-01-21 DIAGNOSIS — I25118 Atherosclerotic heart disease of native coronary artery with other forms of angina pectoris: Secondary | ICD-10-CM | POA: Diagnosis not present

## 2017-01-21 NOTE — Progress Notes (Signed)
Cardiology Office Note Date:  01/21/2017   ID:  Derrick Stokes, DOB Dec 31, 1950, MRN [id number]  PCP:  Derrick, [name], MD  Cardiologist:  Derrick Mocha, MD    Chief Complaint  Patient presents with  . Follow-up    hyperlipidemia     History of Present Illness: Derrick Stokes is a 66 y.o. male who presents for follow-up evaluation. The patient is followed for coronary artery disease. He has a history of CABG, hyperlipidemia, and tobacco abuse. He presented in 2016 with unstable angina and was found to have total occlusion of the saphenous vein graft OM and saphenous vein graft to right coronary artery. The LIMA to LAD and saphenous graft to diagonal patent. He underwent PCI of the native left circumflex and atherectomy and PCI the mid right coronary artery.  The patient is here with his wife today. He has been doing well with no recent symptoms of chest pain or pressure. He complains of wheezing on occasion. He is still smoking. No orthopnea, PND, leg swelling, or heart palpitations. He is compliant with his medications.  Past Medical History:  Diagnosis Date  . Arthritis   . B12 deficiency    Borderline  . CAD (coronary artery disease)    a. s/p CABG 01/2011;  b. ETT Myoview 3/14:  Low risk;  c. 09/2014 Cath/PCI: LM nl, LAD 100p, LCX 31m/OM2 60-70p (2.75x28 Synergy DES), RCA dom, 81m (atherectomy, 3.5x24 Synergy DES), PDA nl, LIMA->LAD nl, VG->Diag nl, VG->OM 100, VG->RCA 100.  [name] Hiatal hernia   . History of echocardiogram    Echo 10/16: EF 50-55%, no RWMA, Gr 1 DD, mild MR, mild TR  . HLD (hyperlipidemia)   . Kidney stones   . Leg pain    ABIs 4/14:  R 1.2, L 1.2, TBIs normal  . Peripheral neuropathy    From trauma  . Tobacco abuse     Past Surgical History:  Procedure Laterality Date  . COLONOSCOPY     Neg  . CORONARY ARTERY BYPASS GRAFT    . EPIDURAL STEROIDS    . LEFT HEART CATHETERIZATION WITH CORONARY/GRAFT ANGIOGRAM N/A 09/18/2014   Procedure: LEFT HEART  CATHETERIZATION WITH Derrick Stokes;  Surgeon: [name], MD;  Location: [facility] CATH LAB;  Service: Cardiovascular;  Laterality: N/A;  . PERCUTANEOUS CORONARY ROTOBLATOR INTERVENTION (PCI-R) N/A 09/20/2014   Procedure: PERCUTANEOUS CORONARY ROTOBLATOR INTERVENTION (PCI-R);  Surgeon: [name], MD;  Location: [facility] CATH LAB;  Service: Cardiovascular;  Laterality: N/A;    Current Outpatient Prescriptions  Medication Sig Dispense Refill  . albuterol (PROVENTIL HFA;VENTOLIN HFA) 108 (90 Base) MCG/ACT inhaler Inhale 2 puffs into the lungs every 6 (six) hours as needed for wheezing or shortness of breath. 1 Inhaler 1  . aspirin EC 81 MG tablet Take 81 mg by mouth at bedtime.     [name] atorvastatin (LIPITOR) 80 MG tablet TAKE 1 TABLET BY MOUTH EVERY DAY AT 6PM 90 tablet 3  . clopidogrel (PLAVIX) 75 MG tablet TAKE 1 TABLET BY MOUTH EVERY DAY WITH BREAKFAST 90 tablet 3  . Cyanocobalamin (VITAMIN B-12 PO) Take 1,000 mg by mouth daily.    [name] MAGNESIUM PO Take 1,000 mg by mouth daily.    . metoprolol tartrate (LOPRESSOR) 25 MG tablet TAKE 1/2 TABLET BY MOUTH TWICE A DAY 30 tablet 0  . nitroGLYCERIN (NITROSTAT) 0.4 MG SL tablet Place 1 tablet (0.4 mg total) under the tongue every 5 (five) minutes x 3 doses as needed for chest pain.  25 tablet 3  . pantoprazole (PROTONIX) 40 MG tablet TAKE 1 TABLET BY MOUTH EVERY DAY 60 tablet 0   No current facility-administered medications for this visit.     Allergies:   Iodine; Iohexol; and Shrimp [shellfish allergy]   Social History:  The patient  reports that he has been smoking Cigarettes.  He has a 45.00 pack-year smoking history. He has quit using smokeless tobacco. He reports that he drinks about 2.4 oz of alcohol per week . He reports that he does not use drugs.   Family History:  The patient's family history includes Cancer in his mother; Diabetes in his father; Heart attack (age of onset: 32) in his father; Heart disease in his father.     ROS:  Please see the history of present illness.  Otherwise, review of systems is positive for hearing loss.  All other systems are reviewed and negative.    PHYSICAL EXAM: VS:  BP 130/80   Pulse 68   Ht 5\' 8"  (1.727 m)   Wt 177 lb 12.8 oz (80.6 kg)   BMI 27.03 kg/m  , BMI Body mass index is 27.03 kg/m. GEN: Well nourished, well developed, in no acute distress  HEENT: normal  Neck: no JVD, no masses. No carotid bruits Cardiac: RRR without murmur or gallop                Respiratory:  clear to auscultation bilaterally, normal work of breathing GI: soft, nontender, nondistended, + BS MS: no deformity or atrophy  Ext: no pretibial edema, pedal pulses 2+= bilaterally Skin: warm and dry, no rash Neuro:  Strength and sensation are intact Psych: euthymic mood, full affect  EKG:  EKG is ordered today. The ekg ordered today shows normal sinus rhythm 68 bpm, minimal voltage criteria for LVH maybe normal variant, possible inferior infarct age undetermined, no change compared to last year's tracing 01/02/2016  Recent Labs: 06/08/2016: BUN 7; Creatinine, Ser 0.82; Potassium 4.5; Sodium 136   Lipid Panel     Component Value Date/Time   CHOL 144 01/02/2016 0831   TRIG 102 01/02/2016 0831   HDL 46 01/02/2016 0831   CHOLHDL 3.1 01/02/2016 0831   VLDL 20 01/02/2016 0831   LDLCALC 78 01/02/2016 0831   LDLDIRECT 68.0 11/19/2014 0737      Wt Readings from Last 3 Encounters:  01/21/17 177 lb 12.8 oz (80.6 kg)  09/08/16 174 lb 8 oz (79.2 kg)  06/08/16 175 lb 8 oz (79.6 kg)     Cardiac Studies Reviewed: 2D Echo 05-02-2015: Study Conclusions  - Left ventricle: The cavity size was normal. Systolic function was   normal. The estimated ejection fraction was in the range of 50%   to 55%. Wall motion was normal; there were no regional wall   motion abnormalities. Doppler parameters are consistent with   abnormal left ventricular relaxation (grade 1 diastolic   dysfunction). There was  no evidence of elevated ventricular   filling pressure by Doppler parameters. - Aortic valve: Trileaflet; mildly thickened, mildly calcified   leaflets. There was no regurgitation. - Aortic root: The aortic root was normal in size. - Mitral valve: Structurally normal valve. There was mild   regurgitation. - Left atrium: The atrium was normal in size. - Right ventricle: Systolic function was normal. - Right atrium: The atrium was normal in size. - Tricuspid valve: There was mild regurgitation. - Pulmonic valve: Structurally normal valve. There was no   regurgitation. - Pulmonary arteries: Systolic pressure was  within the normal   range. - Inferior vena cava: The vessel was normal in size. - Pericardium, extracardiac: There was no pericardial effusion.  ASSESSMENT AND PLAN: 1.  CAD, native vessel, with angina: The patient has not had any recent anginal symptoms on metoprolol. He carries nitroglycerin but hasn't had to take it. He will continue on oral antiplatelet therapy with aspirin and Plavix considering his history of PCI and prior CABG with vein graft occlusion.  2. Hyperlipidemia: Treated with atorvastatin 80 mg. Last lipids reviewed. He is due for lipids and LFTs which will be scheduled tomorrow.  3. Tobacco abuse: Long discussion about the importance of complete tobacco cessation.  Current medicines are reviewed with the patient today.  The patient does not have concerns regarding medicines.  Labs/ tests ordered today include:   Orders Placed This Encounter  Procedures  . Lipid panel  . Hepatic function panel  . EKG 12-Lead    Disposition:   FU one year  Signed, Derrick Mocha, MD  01/21/2017 9:42 AM    [facility] [address], [place], [place]  [address] Phone: [phone]; Fax: [phone]

## 2017-01-21 NOTE — Patient Instructions (Signed)
Medication Instructions:  Your physician recommends that you continue on your current medications as directed. Please refer to the Current Medication list given to you today.  Labwork: Your physician recommends that you return for a FASTING LIPID and LIVER tomorrow--nothing to eat or drink after midnight, lab opens at 7:30 AM   Testing/Procedures: No new orders.   Follow-Up: Your physician wants you to follow-up in: 1 YEAR with Dr Burt Knack. You will receive a reminder letter in the mail two months in advance. If you don't receive a letter, please call our office to schedule the follow-up appointment.   Any Other Special Instructions Will Be Listed Below (If Applicable).     If you need a refill on your cardiac medications before your next appointment, please call your pharmacy.

## 2017-01-22 ENCOUNTER — Other Ambulatory Visit: Payer: Medicare HMO | Admitting: *Deleted

## 2017-01-22 DIAGNOSIS — I25118 Atherosclerotic heart disease of native coronary artery with other forms of angina pectoris: Secondary | ICD-10-CM | POA: Diagnosis not present

## 2017-01-22 DIAGNOSIS — E785 Hyperlipidemia, unspecified: Secondary | ICD-10-CM | POA: Diagnosis not present

## 2017-01-22 LAB — HEPATIC FUNCTION PANEL
ALBUMIN: 4.4 g/dL (ref 3.6–4.8)
ALT: 28 IU/L (ref 0–44)
AST: 28 IU/L (ref 0–40)
Alkaline Phosphatase: 71 IU/L (ref 39–117)
Bilirubin Total: 0.8 mg/dL (ref 0.0–1.2)
Bilirubin, Direct: 0.19 mg/dL (ref 0.00–0.40)
TOTAL PROTEIN: 6.9 g/dL (ref 6.0–8.5)

## 2017-01-22 LAB — LIPID PANEL
CHOLESTEROL TOTAL: 173 mg/dL (ref 100–199)
Chol/HDL Ratio: 3.8 ratio (ref 0.0–5.0)
HDL: 45 mg/dL (ref >39)
LDL Calculated: 97 mg/dL (ref 0–99)
TRIGLYCERIDES: 157 mg/dL — AB (ref 0–149)
VLDL CHOLESTEROL CAL: 31 mg/dL (ref 5–40)

## 2017-02-02 ENCOUNTER — Telehealth: Payer: Self-pay | Admitting: *Deleted

## 2017-02-02 NOTE — Telephone Encounter (Signed)
DPR ok to s/w pt's wife. When speaking with pt's wife about lab results and confirming pt was indeed taking Atorvastatin 80 mg everyday, wife answered most night he takes it but has occasionally fallen asleep w/o taking it. She states she has been on him to make sure to take his medications as prescribed. I encouraged how important it is to take his Atorvastatin everyday, as well as working on diet. I did go over some diet ideas for helping the Trigs, by changing from simple carbs to complex carbs. Wife thanked me for my help and taking the time to go over the results.

## 2017-02-02 NOTE — Telephone Encounter (Signed)
-----   Message from Sherren Mocha, MD sent at 01/26/2017 12:29 PM EDT ----- Pt treated with atorvastatin 80 mg. Would work on lifestyle modification, make sure he's taking atorvastatin every day, and continue same Rx.

## 2017-02-03 ENCOUNTER — Telehealth: Payer: Self-pay

## 2017-02-03 NOTE — Telephone Encounter (Signed)
Patient is on the list for [employer] 2018 and may be a good candidate for an AWV. Please let me know if/when appt is scheduled.   

## 2017-02-17 ENCOUNTER — Other Ambulatory Visit: Payer: Self-pay | Admitting: Cardiovascular Disease

## 2017-03-20 ENCOUNTER — Other Ambulatory Visit: Payer: Self-pay | Admitting: Cardiovascular Disease

## 2017-04-08 ENCOUNTER — Encounter: Payer: Self-pay | Admitting: Family Medicine

## 2017-05-02 ENCOUNTER — Other Ambulatory Visit: Payer: Self-pay | Admitting: Cardiovascular Disease

## 2017-08-02 ENCOUNTER — Other Ambulatory Visit: Payer: Self-pay | Admitting: Cardiovascular Disease

## 2017-08-02 DIAGNOSIS — H5203 Hypermetropia, bilateral: Secondary | ICD-10-CM | POA: Diagnosis not present

## 2017-08-02 DIAGNOSIS — H40013 Open angle with borderline findings, low risk, bilateral: Secondary | ICD-10-CM | POA: Diagnosis not present

## 2017-09-16 NOTE — Progress Notes (Signed)
HPI:   [name] is a 67 y.o. male, who is here today for 12 months follow up.   He was last seen on 09/08/16  Since his last OV he has followed with Dr Derrick Stokes.  B12 deficiency: Currently he is on B12 1000 mcg daily, he just resumed it.   Hyperlipidemia:  Currently on Lipitor 80 mg daily. Following a low fat diet: not consistently.[name]  He has not noted side effects with medication.  Lab Results  Component Value Date   CHOL 173 01/22/2017   HDL 45 01/22/2017   LDLCALC 97 01/22/2017   LDLDIRECT 68.0 11/19/2014   TRIG 157 (H) 01/22/2017   CHOLHDL 3.8 01/22/2017    CAD: He follows annually with Dr Derrick Stokes. He needs refill for Nitro SL. He has not needed it in over a year.  Still smoking.  Hx of lower back pain with LE numbness. He follows with ortho and according to pt,MRI but his insurance did not cover it. He has followed with Dr Derrick Stokes  GERD:  He is on Protonix 40 mg daily.  Denies abdominal pain, nausea, vomiting, changes in bowel habits, blood in stool or melena.    "Popping" ears and sore throat: For the past 2 weeks he has had intermittent sore throat and ear fullness sensation, symptoms are usually in the morning. His wife had a cold recently. He has not noted fever or chills. + Post nasal drainage.  He has not tried OTC medication.    Review of Systems  Constitutional: Negative for activity change, appetite change, fatigue and fever.  HENT: Positive for postnasal drip, rhinorrhea, sore throat and tinnitus (chronic.). Negative for congestion, mouth sores, nosebleeds and trouble swallowing.   Eyes: Negative for redness and visual disturbance.  Respiratory: Negative for apnea, cough, shortness of breath and wheezing.   Cardiovascular: Negative for chest pain, palpitations and leg swelling.  Gastrointestinal: Negative for abdominal pain, nausea and vomiting.  Endocrine: Negative for cold intolerance and heat intolerance.    Genitourinary: Negative for decreased urine volume, dysuria and hematuria.  Musculoskeletal: Positive for back pain. Negative for gait problem.  Skin: Negative for rash and wound.  Neurological: Positive for numbness. Negative for dizziness, syncope, weakness and headaches.  Psychiatric/Behavioral: Negative for confusion.     Current Outpatient Medications on File Prior to Visit  Medication Sig Dispense Refill  . albuterol (PROVENTIL HFA;VENTOLIN HFA) 108 (90 Base) MCG/ACT inhaler Inhale 2 puffs into the lungs every 6 (six) hours as needed for wheezing or shortness of breath. 1 Inhaler 1  . aspirin EC 81 MG tablet Take 81 mg by mouth at bedtime.     [name] atorvastatin (LIPITOR) 80 MG tablet TAKE 1 TABLET BY MOUTH EVERY DAY AT 6PM 90 tablet 3  . clopidogrel (PLAVIX) 75 MG tablet TAKE 1 TABLET BY MOUTH EVERY DAY WITH BREAKFAST 90 tablet 3  . Cyanocobalamin (VITAMIN B-12 PO) Take 1,000 mg by mouth daily.    [name] MAGNESIUM PO Take 1,000 mg by mouth daily.    . metoprolol tartrate (LOPRESSOR) 25 MG tablet TAKE 1/2 TABLET BY MOUTH TWICE A DAY 90 tablet 2  . pantoprazole (PROTONIX) 40 MG tablet TAKE 1 TABLET BY MOUTH EVERY DAY 60 tablet 9   No current facility-administered medications on file prior to visit.      Past Medical History:  Diagnosis Date  . Arthritis   . B12 deficiency    Borderline  . CAD (coronary artery disease)  a. s/p CABG 01/2011;  b. ETT Myoview 3/14:  Low risk;  c. 09/2014 Cath/PCI: LM nl, LAD 100p, LCX 3m/OM2 60-70p (2.75x28 Synergy DES), RCA dom, 31m (atherectomy, 3.5x24 Synergy DES), PDA nl, LIMA->LAD nl, VG->Diag nl, VG->OM 100, VG->RCA 100.  [name] Hiatal hernia   . History of echocardiogram    Echo 10/16: EF 50-55%, no RWMA, Gr 1 DD, mild MR, mild TR  . HLD (hyperlipidemia)   . Kidney stones   . Leg pain    ABIs 4/14:  R 1.2, L 1.2, TBIs normal  . Peripheral neuropathy    From trauma  . Tobacco abuse    Allergies  Allergen Reactions  . Iodine Nausea And Vomiting     Turns white in face and sweaty  . Iohexol Other (See Comments)    May have caused nausea and vomiting several years ago   . Shrimp [Shellfish Allergy] Nausea And Vomiting    Turns white in face and sweaty    Social History   Socioeconomic History  . Marital status: Married    Spouse name: None  . Number of children: None  . Years of education: None  . Highest education level: None  Social Needs  . Financial resource strain: None  . Food insecurity - worry: None  . Food insecurity - inability: None  . Transportation needs - medical: None  . Transportation needs - non-medical: None  Occupational History  . Occupation: Diasability  Tobacco Use  . Smoking status: Current Every Day Smoker    Packs/day: 1.00    Years: 45.00    Pack years: 45.00    Types: Cigarettes    Last attempt to quit: 10/01/2014    Years since quitting: 2.9  . Smokeless tobacco: Former Network engineer and Sexual Activity  . Alcohol use: Yes    Alcohol/week: 2.4 oz    Types: 4 Cans of beer per week    Comment: occ  . Drug use: No  . Sexual activity: Yes  Other Topics Concern  . None  Social History Narrative   Married   Gets regular exercise - walks 1/4 mile per day 5 times a week without symptoms    Vitals:   09/17/17 0717  BP: 133/84  Pulse: 60  Resp: 12  Temp: 97.7 F (36.5 C)  SpO2: 98%   Body mass index is 27.41 kg/m.   Physical Exam  Nursing note and vitals reviewed. Constitutional: He is oriented to person, place, and time. He appears well-developed. No distress.  HENT:  Head: Normocephalic and atraumatic.  Right Ear: Tympanic membrane is not erythematous and not bulging. A middle ear effusion is present.  Mouth/Throat: Oropharynx is clear and moist and mucous membranes are normal.  Poor dentition.  Eyes: Conjunctivae are normal. Pupils are equal, round, and reactive to light.  Cardiovascular: Normal rate and regular rhythm.  No murmur heard. Pulses:      Dorsalis pedis  pulses are 2+ on the right side, and 2+ on the left side.  Respiratory: Effort normal and breath sounds normal. No respiratory distress.  GI: Soft. He exhibits no mass. There is no hepatomegaly. There is no tenderness.  Musculoskeletal: He exhibits edema (Trace pitting LE edema,bilateral.). He exhibits no tenderness.  Lymphadenopathy:       Head (right side): No submandibular adenopathy present.       Head (left side): No submandibular adenopathy present.    He has no cervical adenopathy.  Neurological: He is alert and oriented to  person, place, and time. He has normal strength.  Skin: Skin is warm. No erythema.  Psychiatric: He has a normal mood and affect. Cognition and memory are normal.  Well groomed, good eye contact.     ASSESSMENT AND PLAN:   Mr. DEFORREST BOGLE was seen today for 12 months follow-up.  Orders Placed This Encounter  Procedures  . Basic metabolic panel  . Vitamin B12  . Lipid panel    Low back pain radiating to both legs Has not been symptomatic lately. Recommend caution with activities that could aggravate pain. He will continue following with ortho.  Hyperlipidemia Low fat diet discussed and recommended. No changes in current management, will follow labs and will give further recommendations accordingly. He is not fasting today,so will come back next week for fasting labs. F/U in 6-12 months.  B12 deficiency No changes in current management, will follow B12 and will give further recommendations accordingly.   CAD (coronary artery disease), native coronary artery Asymptomatic. Smoking cessation strongly recommended. Continue Aspirin 81 mg and statin. Instructed about warning signs. Continue following with Dr Derrick Stokes.   GERD (gastroesophageal reflux disease) Asymptomatic.  No changes in current management. F/U in 12 months.   Tobacco abuse He is not interested in pharmacologic treatment. He is not interested in quitting at this time. He  understands adverse effects of tobacco.  Pharyngitis, unspecified etiology  ? Allergic vs viral. Autoinflation maneuvers may help with mild ear effusion. Throat lozenges OTC recommended. Encouraged smoking cessation. F/U as needed.   -[name] was advised to return sooner than planned today if new concerns arise.       Janayia Burggraf G. [name], MD  [facility]. [place] office.

## 2017-09-17 ENCOUNTER — Encounter: Payer: Self-pay | Admitting: Family Medicine

## 2017-09-17 ENCOUNTER — Ambulatory Visit (INDEPENDENT_AMBULATORY_CARE_PROVIDER_SITE_OTHER): Payer: Medicare HMO | Admitting: Family Medicine

## 2017-09-17 VITALS — BP 133/84 | HR 60 | Temp 97.7°F | Resp 12 | Ht 68.0 in | Wt 180.2 lb

## 2017-09-17 DIAGNOSIS — E785 Hyperlipidemia, unspecified: Secondary | ICD-10-CM | POA: Diagnosis not present

## 2017-09-17 DIAGNOSIS — M545 Low back pain: Secondary | ICD-10-CM | POA: Diagnosis not present

## 2017-09-17 DIAGNOSIS — I25118 Atherosclerotic heart disease of native coronary artery with other forms of angina pectoris: Secondary | ICD-10-CM

## 2017-09-17 DIAGNOSIS — J029 Acute pharyngitis, unspecified: Secondary | ICD-10-CM

## 2017-09-17 DIAGNOSIS — K219 Gastro-esophageal reflux disease without esophagitis: Secondary | ICD-10-CM

## 2017-09-17 DIAGNOSIS — Z720 Tobacco use: Secondary | ICD-10-CM | POA: Diagnosis not present

## 2017-09-17 DIAGNOSIS — E538 Deficiency of other specified B group vitamins: Secondary | ICD-10-CM | POA: Diagnosis not present

## 2017-09-17 DIAGNOSIS — M79604 Pain in right leg: Secondary | ICD-10-CM

## 2017-09-17 MED ORDER — NITROGLYCERIN 0.4 MG SL SUBL: 0.4000 mg | SUBLINGUAL_TABLET | SUBLINGUAL | 3 refills | Status: DC | PRN

## 2017-09-17 MED ORDER — IPRATROPIUM BROMIDE 0.06 % NA SOLN: 2.0000 | Freq: Four times a day (QID) | NASAL | 12 refills | Status: DC

## 2017-09-17 NOTE — Assessment & Plan Note (Signed)
He is not interested in pharmacologic treatment. He is not interested in quitting at this time. He understands adverse effects of tobacco.

## 2017-09-17 NOTE — Assessment & Plan Note (Signed)
Asymptomatic.  No changes in current management. F/U in 12 months.

## 2017-09-17 NOTE — Assessment & Plan Note (Signed)
Has not been symptomatic lately. Recommend caution with activities that could aggravate pain. He will continue following with ortho.

## 2017-09-17 NOTE — Patient Instructions (Signed)
A few things to remember from today's visit:   Gastroesophageal reflux disease, esophagitis presence not specified  Hyperlipidemia, unspecified hyperlipidemia type - Plan: Basic metabolic panel, Lipid panel  Low back pain radiating to both legs  B12 deficiency - Plan: Vitamin B12  Coronary artery disease of native artery of native heart with stable angina pectoris ([place]) - Plan: Lipid panel  Continue following with Dr Burt Knack.  No changes for now, we will follow labs.   Cholesterol Cholesterol is a fat. Your body needs a small amount of cholesterol. Cholesterol (plaque) may build up in your blood vessels (arteries). That makes you more likely to have a heart attack or stroke. You cannot feel your cholesterol level. Having a blood test is the only way to find out if your level is high. Keep your test results. Work with your doctor to keep your cholesterol at a good level. What do the results mean?  Total cholesterol is how much cholesterol is in your blood.  LDL is bad cholesterol. This is the type that can build up. Try to have low LDL.  HDL is good cholesterol. It cleans your blood vessels and carries LDL away. Try to have high HDL.  Triglycerides are fat that the body can store or burn for energy. What are good levels of cholesterol?  Total cholesterol below 200.  LDL below 100 is good for people who have health risks. LDL below 70 is good for people who have very high risks.  HDL above 40 is good. It is best to have HDL of 60 or higher.  Triglycerides below 150. How can I lower my cholesterol? Diet Follow your diet program as told by your doctor.  Choose fish, white meat chicken, or [place] that is roasted or baked. Try not to eat red meat, fried foods, sausage, or lunch meats.  Eat lots of fresh fruits and vegetables.  Choose whole grains, beans, pasta, potatoes, and cereals.  Choose olive oil, corn oil, or canola oil. Only use small amounts.  Try not to eat  butter, mayonnaise, shortening, or palm kernel oils.  Try not to eat foods with trans fats.  Choose low-fat or nonfat dairy foods. ? Drink skim or nonfat milk. ? Eat low-fat or nonfat yogurt and cheeses. ? Try not to drink whole milk or cream. ? Try not to eat ice cream, egg yolks, or full-fat cheeses.  Healthy desserts include angel food cake, ginger snaps, animal crackers, hard candy, popsicles, and low-fat or nonfat frozen yogurt. Try not to eat pastries, cakes, pies, and cookies.  Exercise Follow your exercise program as told by your doctor.  Be more active. Try gardening, walking, and taking the stairs.  Ask your doctor about ways that you can be more active.  Medicine  Take over-the-counter and prescription medicines only as told by your doctor. This information is not intended to replace advice given to you by your health care provider. Make sure you discuss any questions you have with your health care provider. Document Released: 10/02/2008 Document Revised: 02/05/2016 Document Reviewed: 01/16/2016 Elsevier Interactive Patient Education  2018 [employer].  Please be sure medication list is accurate. If a new problem present, please set up appointment sooner than planned today.

## 2017-09-17 NOTE — Assessment & Plan Note (Signed)
Asymptomatic. Smoking cessation strongly recommended. Continue Aspirin 81 mg and statin. Instructed about warning signs. Continue following with Dr Burt Knack.

## 2017-09-17 NOTE — Assessment & Plan Note (Addendum)
Low fat diet discussed and recommended. No changes in current management, will follow labs and will give further recommendations accordingly. He is not fasting today,so will come back next week for fasting labs. F/U in 6-12 months.

## 2017-09-17 NOTE — Assessment & Plan Note (Addendum)
No changes in current management, will follow B12 and will give further recommendations accordingly.

## 2017-09-19 NOTE — Addendum Note (Signed)
Addended by: [name], Milania Haubner G on: 09/19/2017 03:10 PM   Modules accepted: Orders

## 2017-09-20 ENCOUNTER — Other Ambulatory Visit (INDEPENDENT_AMBULATORY_CARE_PROVIDER_SITE_OTHER): Payer: Medicare HMO

## 2017-09-20 DIAGNOSIS — E538 Deficiency of other specified B group vitamins: Secondary | ICD-10-CM

## 2017-09-20 DIAGNOSIS — E785 Hyperlipidemia, unspecified: Secondary | ICD-10-CM | POA: Diagnosis not present

## 2017-09-20 LAB — BASIC METABOLIC PANEL
BUN: 15 mg/dL (ref 6–23)
CHLORIDE: 105 meq/L (ref 96–112)
CO2: 27 mEq/L (ref 19–32)
Calcium: 9.6 mg/dL (ref 8.4–10.5)
Creatinine, Ser: 0.73 mg/dL (ref 0.40–1.50)
GFR: 114.61 mL/min (ref >60.00)
Glucose, Bld: 85 mg/dL (ref 70–99)
Potassium: 4.5 mEq/L (ref 3.5–5.1)
Sodium: 139 mEq/L (ref 135–145)

## 2017-09-20 LAB — LIPID PANEL
CHOL/HDL RATIO: 3
Cholesterol: 147 mg/dL (ref 0–200)
HDL: 45 mg/dL (ref >39.00)
LDL Cholesterol: 82 mg/dL (ref 0–99)
NonHDL: 102.06
Triglycerides: 102 mg/dL (ref 0.0–149.0)
VLDL: 20.4 mg/dL (ref 0.0–40.0)

## 2017-09-20 LAB — VITAMIN B12: Vitamin B-12: 728 pg/mL (ref 211–911)

## 2017-09-21 ENCOUNTER — Encounter: Payer: Self-pay | Admitting: Family Medicine

## 2017-10-17 ENCOUNTER — Other Ambulatory Visit: Payer: Self-pay | Admitting: Cardiovascular Disease

## 2017-10-17 DIAGNOSIS — E785 Hyperlipidemia, unspecified: Secondary | ICD-10-CM

## 2017-11-12 ENCOUNTER — Telehealth: Payer: Self-pay | Admitting: Cardiovascular Disease

## 2017-11-12 NOTE — Telephone Encounter (Signed)
New Message   Pt wants to know if he labs prior or on his appt 03/31/18. Please call

## 2017-11-22 NOTE — Telephone Encounter (Signed)
Per DPR form, left message that patient had labs in March that were improved from prior, so no new labs should be needed at this time.  Instructed him to call back with questions or concerns.

## 2018-01-13 ENCOUNTER — Other Ambulatory Visit: Payer: Self-pay | Admitting: Cardiovascular Disease

## 2018-01-13 DIAGNOSIS — E785 Hyperlipidemia, unspecified: Secondary | ICD-10-CM

## 2018-01-31 DIAGNOSIS — H40013 Open angle with borderline findings, low risk, bilateral: Secondary | ICD-10-CM | POA: Diagnosis not present

## 2018-03-08 ENCOUNTER — Encounter: Payer: Self-pay | Admitting: Cardiovascular Disease

## 2018-03-16 ENCOUNTER — Encounter: Payer: Self-pay | Admitting: Physician Assistant

## 2018-03-16 ENCOUNTER — Ambulatory Visit: Payer: Medicare HMO | Admitting: Physician Assistant

## 2018-03-16 ENCOUNTER — Observation Stay (HOSPITAL_COMMUNITY)
Admission: AD | Admit: 2018-03-16 | Discharge: 2018-03-18 | Disposition: A | Discharge status: Home / Self Care | Payer: Medicare HMO | Source: Ambulatory Visit | Attending: Internal Medicine | Admitting: Internal Medicine

## 2018-03-16 ENCOUNTER — Other Ambulatory Visit: Payer: Self-pay

## 2018-03-16 ENCOUNTER — Encounter (HOSPITAL_COMMUNITY): Payer: Self-pay

## 2018-03-16 VITALS — BP 128/88 | HR 61 | Ht 68.0 in | Wt 175.5 lb

## 2018-03-16 DIAGNOSIS — I25110 Atherosclerotic heart disease of native coronary artery with unstable angina pectoris: Secondary | ICD-10-CM | POA: Diagnosis not present

## 2018-03-16 DIAGNOSIS — J45909 Unspecified asthma, uncomplicated: Secondary | ICD-10-CM | POA: Diagnosis present

## 2018-03-16 DIAGNOSIS — Z720 Tobacco use: Secondary | ICD-10-CM

## 2018-03-16 DIAGNOSIS — Z79899 Other long term (current) drug therapy: Secondary | ICD-10-CM | POA: Insufficient documentation

## 2018-03-16 DIAGNOSIS — Z91013 Allergy to seafood: Secondary | ICD-10-CM | POA: Diagnosis not present

## 2018-03-16 DIAGNOSIS — J441 Chronic obstructive pulmonary disease with (acute) exacerbation: Secondary | ICD-10-CM | POA: Diagnosis present

## 2018-03-16 DIAGNOSIS — Z7902 Long term (current) use of antithrombotics/antiplatelets: Secondary | ICD-10-CM | POA: Insufficient documentation

## 2018-03-16 DIAGNOSIS — R079 Chest pain, unspecified: Secondary | ICD-10-CM | POA: Diagnosis present

## 2018-03-16 DIAGNOSIS — G629 Polyneuropathy, unspecified: Secondary | ICD-10-CM | POA: Insufficient documentation

## 2018-03-16 DIAGNOSIS — Z951 Presence of aortocoronary bypass graft: Secondary | ICD-10-CM | POA: Insufficient documentation

## 2018-03-16 DIAGNOSIS — Z8249 Family history of ischemic heart disease and other diseases of the circulatory system: Secondary | ICD-10-CM | POA: Diagnosis not present

## 2018-03-16 DIAGNOSIS — Z9889 Other specified postprocedural states: Secondary | ICD-10-CM | POA: Insufficient documentation

## 2018-03-16 DIAGNOSIS — Z7982 Long term (current) use of aspirin: Secondary | ICD-10-CM | POA: Insufficient documentation

## 2018-03-16 DIAGNOSIS — M1990 Unspecified osteoarthritis, unspecified site: Secondary | ICD-10-CM | POA: Insufficient documentation

## 2018-03-16 DIAGNOSIS — R001 Bradycardia, unspecified: Secondary | ICD-10-CM | POA: Diagnosis not present

## 2018-03-16 DIAGNOSIS — I208 Other forms of angina pectoris: Secondary | ICD-10-CM | POA: Diagnosis present

## 2018-03-16 DIAGNOSIS — R0789 Other chest pain: Secondary | ICD-10-CM | POA: Diagnosis not present

## 2018-03-16 DIAGNOSIS — F17210 Nicotine dependence, cigarettes, uncomplicated: Secondary | ICD-10-CM | POA: Insufficient documentation

## 2018-03-16 DIAGNOSIS — E785 Hyperlipidemia, unspecified: Secondary | ICD-10-CM

## 2018-03-16 DIAGNOSIS — Z955 Presence of coronary angioplasty implant and graft: Secondary | ICD-10-CM | POA: Insufficient documentation

## 2018-03-16 DIAGNOSIS — J989 Respiratory disorder, unspecified: Secondary | ICD-10-CM | POA: Diagnosis present

## 2018-03-16 DIAGNOSIS — R0989 Other specified symptoms and signs involving the circulatory and respiratory systems: Secondary | ICD-10-CM | POA: Diagnosis present

## 2018-03-16 DIAGNOSIS — R69 Illness, unspecified: Secondary | ICD-10-CM | POA: Diagnosis not present

## 2018-03-16 DIAGNOSIS — I2510 Atherosclerotic heart disease of native coronary artery without angina pectoris: Secondary | ICD-10-CM | POA: Diagnosis present

## 2018-03-16 DIAGNOSIS — Z888 Allergy status to other drugs, medicaments and biological substances status: Secondary | ICD-10-CM | POA: Diagnosis not present

## 2018-03-16 DIAGNOSIS — Z87442 Personal history of urinary calculi: Secondary | ICD-10-CM | POA: Insufficient documentation

## 2018-03-16 DIAGNOSIS — J449 Chronic obstructive pulmonary disease, unspecified: Secondary | ICD-10-CM | POA: Diagnosis present

## 2018-03-16 HISTORY — DX: Unspecified asthma, uncomplicated: J45.909

## 2018-03-16 LAB — CBC WITH DIFFERENTIAL/PLATELET
Abs Immature Granulocytes: 0 10*3/uL (ref 0.0–0.1)
BASOS PCT: 1 %
Basophils Absolute: 0.1 10*3/uL (ref 0.0–0.1)
Eosinophils Absolute: 0.3 10*3/uL (ref 0.0–0.7)
Eosinophils Relative: 3 %
HEMATOCRIT: 45.1 % (ref 39.0–52.0)
Hemoglobin: 15.6 g/dL (ref 13.0–17.0)
Immature Granulocytes: 0 %
LYMPHS PCT: 31 %
Lymphs Abs: 2.4 10*3/uL (ref 0.7–4.0)
MCH: 32.2 pg (ref 26.0–34.0)
MCHC: 34.6 g/dL (ref 30.0–36.0)
MCV: 93.2 fL (ref 78.0–100.0)
MONO ABS: 0.8 10*3/uL (ref 0.1–1.0)
Monocytes Relative: 10 %
Neutro Abs: 4.3 10*3/uL (ref 1.7–7.7)
Neutrophils Relative %: 55 %
PLATELETS: 205 10*3/uL (ref 150–400)
RBC: 4.84 MIL/uL (ref 4.22–5.81)
RDW: 12.7 % (ref 11.5–15.5)
WBC: 7.8 10*3/uL (ref 4.0–10.5)

## 2018-03-16 LAB — COMPREHENSIVE METABOLIC PANEL
ALBUMIN: 3.9 g/dL (ref 3.5–5.0)
ALT: 35 U/L (ref 0–44)
AST: 25 U/L (ref 15–41)
Alkaline Phosphatase: 63 U/L (ref 38–126)
Anion gap: 9 (ref 5–15)
BUN: 12 mg/dL (ref 8–23)
CHLORIDE: 103 mmol/L (ref 98–111)
CO2: 27 mmol/L (ref 22–32)
CREATININE: 0.91 mg/dL (ref 0.61–1.24)
Calcium: 9.6 mg/dL (ref 8.9–10.3)
GFR calc non Af Amer: >60 mL/min (ref >60)
GLUCOSE: 107 mg/dL — AB (ref 70–99)
Potassium: 4 mmol/L (ref 3.5–5.1)
SODIUM: 139 mmol/L (ref 135–145)
Total Bilirubin: 0.7 mg/dL (ref 0.3–1.2)
Total Protein: 6.9 g/dL (ref 6.5–8.1)

## 2018-03-16 LAB — GLUCOSE, CAPILLARY: Glucose-Capillary: 100 mg/dL — ABNORMAL HIGH (ref 70–99)

## 2018-03-16 LAB — TROPONIN I: Troponin I: <0.03 ng/mL (ref <0.03)

## 2018-03-16 LAB — PROTIME-INR
INR: 1.06
Prothrombin Time: 13.7 seconds (ref 11.4–15.2)

## 2018-03-16 LAB — APTT: APTT: 25 s (ref 24–36)

## 2018-03-16 MED ORDER — ALBUTEROL SULFATE (2.5 MG/3ML) 0.083% IN NEBU: 3.0000 mL | INHALATION_SOLUTION | Freq: Four times a day (QID) | RESPIRATORY_TRACT | Status: DC | PRN

## 2018-03-16 MED ORDER — ASPIRIN EC 81 MG PO TBEC
81.0000 mg | DELAYED_RELEASE_TABLET | Freq: Every day | ORAL | Status: DC
  Administered 2018-03-16: 81 mg via ORAL
  Filled 2018-03-16: qty 1

## 2018-03-16 MED ORDER — SODIUM CHLORIDE 0.9% FLUSH: 3.0000 mL | INTRAVENOUS | Status: DC | PRN

## 2018-03-16 MED ORDER — CLOPIDOGREL BISULFATE 75 MG PO TABS: 75.0000 mg | ORAL_TABLET | Freq: Every day | ORAL | Status: DC

## 2018-03-16 MED ORDER — ATORVASTATIN CALCIUM 80 MG PO TABS
80.0000 mg | ORAL_TABLET | Freq: Every day | ORAL | Status: DC
  Administered 2018-03-16 – 2018-03-17 (×2): 80 mg via ORAL
  Filled 2018-03-16 (×2): qty 1

## 2018-03-16 MED ORDER — HEPARIN (PORCINE) IN NACL 100-0.45 UNIT/ML-% IJ SOLN
1050.0000 [IU]/h | INTRAMUSCULAR | Status: DC
  Administered 2018-03-16: 1050 [IU]/h via INTRAVENOUS
  Filled 2018-03-16 (×2): qty 250

## 2018-03-16 MED ORDER — SODIUM CHLORIDE 0.9% FLUSH
3.0000 mL | Freq: Two times a day (BID) | INTRAVENOUS | Status: DC
  Administered 2018-03-16 – 2018-03-17 (×2): 3 mL via INTRAVENOUS

## 2018-03-16 MED ORDER — SODIUM CHLORIDE 0.9 % IV SOLN: 250.0000 mL | INTRAVENOUS | Status: DC | PRN

## 2018-03-16 MED ORDER — NITROGLYCERIN IN D5W 200-5 MCG/ML-% IV SOLN
0.0000 ug/min | INTRAVENOUS | Status: DC
  Administered 2018-03-16: 5 ug/min via INTRAVENOUS
  Filled 2018-03-16: qty 250

## 2018-03-16 MED ORDER — NITROGLYCERIN 0.4 MG SL SUBL: 0.4000 mg | SUBLINGUAL_TABLET | SUBLINGUAL | Status: DC | PRN

## 2018-03-16 MED ORDER — PANTOPRAZOLE SODIUM 40 MG PO TBEC
40.0000 mg | DELAYED_RELEASE_TABLET | Freq: Every day | ORAL | Status: DC
  Administered 2018-03-17 – 2018-03-18 (×2): 40 mg via ORAL
  Filled 2018-03-16 (×2): qty 1

## 2018-03-16 MED ORDER — ONDANSETRON HCL 4 MG/2ML IJ SOLN: 4.0000 mg | Freq: Four times a day (QID) | INTRAMUSCULAR | Status: DC | PRN

## 2018-03-16 MED ORDER — METOPROLOL TARTRATE 12.5 MG HALF TABLET
12.5000 mg | ORAL_TABLET | Freq: Two times a day (BID) | ORAL | Status: DC
  Administered 2018-03-16 – 2018-03-18 (×4): 12.5 mg via ORAL
  Filled 2018-03-16 (×4): qty 1

## 2018-03-16 MED ORDER — HEPARIN BOLUS VIA INFUSION
4000.0000 [IU] | Freq: Once | INTRAVENOUS | Status: AC
  Administered 2018-03-16: 4000 [IU] via INTRAVENOUS
  Filled 2018-03-16: qty 4000

## 2018-03-16 MED ORDER — ACETAMINOPHEN 325 MG PO TABS
650.0000 mg | ORAL_TABLET | ORAL | Status: DC | PRN
  Administered 2018-03-16: 650 mg via ORAL
  Filled 2018-03-16: qty 2

## 2018-03-16 NOTE — Plan of Care (Signed)
  Problem: Clinical Measurements: Goal: Diagnostic test results will improve Outcome: Progressing   Problem: Pain Managment: Goal: General experience of comfort will improve Outcome: Progressing   

## 2018-03-16 NOTE — Progress Notes (Signed)
Cardiology Office Note:    Date:  03/16/2018   ID:  Derrick Stokes, DOB 04-18-1951, MRN [id number]  PCP:  [name], [name], MD  Cardiologist:  Derrick Mocha, MD    Referring MD: [name], [name], MD   Chief Complaint  Patient presents with  . Chest Pain    History of Present Illness:    Derrick Stokes is a 67 y.o. male with coronary artery disease s/p CABG, hyperlipidemia, tobacco abuse.  He was admitted with unstable angina in 2016.  Cardiac Catheterization demonstrated an occluded S-OM and S-RCA. L-LAD, S-Dx grafts were patent. There was high grade disease in the mid LCx leading into OM2 and high grade disease in the mid RCA. He underwent PCI with a DES to the LCx leading into the OM2. He was brought back for staged intervention 2 days later with orbital atherectomy and DES to the RCA.  He was last seen by Dr. Sherren Stokes in 01/2017.    Derrick Stokes returns for evaluation of chest pain.  He notes progressively worsening L sided chest heaviness over the past 2 weeks.  He notes discomfort with any activity now.  He generally has worsening pain throughout the day.  He usually has no pain when he awakens in the AM.  He denies assoc nausea, diaphoresis, radiation, shortness of breath.  He denies syncope, paroxysmal nocturnal dyspnea, leg swelling. He is smoking again.  He notes a chronic productive cough without change. He denies fevers.  Prior CV studies:   The following studies were reviewed today:  Echo 05/02/15 EF 50-55%, normal wall motion, grade 1 diastolic dysfunction, mild MR, normal RVSF, mild TR  Cardiac cath/staged PCI 09/18/14 LM: patent LCx: patent. Prox stent widely patent. RCA: heavily calcified mid vessel lesion, up to 90%. PCI: orbital atherectomy and 3.5 x 24 Synergy DES to RCA  Cardiac cath/PCI 09/20/14 LM: mild disease.  LAD: occluded proximally.  LCx: Mid 80%, proximal OM2 calcified 60-70% RCA: Mid calcified 80% LIMA-LAD: widely patent.  SVG-Dx:  widely patent.  SVG-OM: occluded. SVG-RCA: occluded. LVEDP was 14 mmHg. PCI: 2.75 x 28 Synergy DES to LCx and OM2  Myoview 10/18/12 Inferior thinning versus prior infarct, no ischemia, EF 59%; low risk   Past Medical History:  Diagnosis Date  . Arthritis   . B12 deficiency    Borderline  . CAD (coronary artery disease)    a. s/p CABG 01/2011;  b. ETT Myoview 3/14:  Low risk;  c. 09/2014 Cath/PCI: LM nl, LAD 100p, LCX 9m/OM2 60-70p (2.75x28 Synergy DES), RCA dom, 69m (atherectomy, 3.5x24 Synergy DES), PDA nl, LIMA->LAD nl, VG->Diag nl, VG->OM 100, VG->RCA 100.  [name] Hiatal hernia   . History of echocardiogram    Echo 10/16: EF 50-55%, no RWMA, Gr 1 DD, mild MR, mild TR  . HLD (hyperlipidemia)   . Kidney stones   . Leg pain    ABIs 4/14:  R 1.2, L 1.2, TBIs normal  . Peripheral neuropathy    From trauma  . Tobacco abuse    Surgical Hx: The patient  has a past surgical history that includes Colonoscopy; EPIDURAL STEROIDS; Coronary artery bypass graft; left heart catheterization with coronary/graft angiogram (N/A, 09/18/2014); and percutaneous coronary rotoblator intervention (pci-r) (N/A, 09/20/2014).   Current Medications: Current Meds  Medication Sig  . albuterol (PROVENTIL HFA;VENTOLIN HFA) 108 (90 Base) MCG/ACT inhaler Inhale 2 puffs into the lungs every 6 (six) hours as needed for wheezing or shortness of breath.  [name] aspirin EC 81  MG tablet Take 81 mg by mouth at bedtime.   [name] atorvastatin (LIPITOR) 80 MG tablet TAKE 1 TABLET (80 MG TOTAL) BY MOUTH DAILY AT 6 PM.  . clopidogrel (PLAVIX) 75 MG tablet TAKE 1 TABLET BY MOUTH EVERY DAY WITH BREAKFAST  . Cyanocobalamin (VITAMIN B-12 PO) Take 1,000 mg by mouth daily.  [name] MAGNESIUM PO Take 1,000 mg by mouth daily.  . metoprolol tartrate (LOPRESSOR) 25 MG tablet TAKE 1/2 TABLET BY MOUTH TWICE A DAY  . nitroGLYCERIN (NITROSTAT) 0.4 MG SL tablet Place 1 tablet (0.4 mg total) under the tongue every 5 (five) minutes x 3 doses as needed for  chest pain.  . pantoprazole (PROTONIX) 40 MG tablet TAKE 1 TABLET BY MOUTH EVERY DAY     Allergies:   Iodine; Iohexol; and Shrimp [shellfish allergy]   Social History   Tobacco Use  . Smoking status: Current Every Day Smoker    Packs/day: 1.00    Years: 45.00    Pack years: 45.00    Types: Cigarettes    Last attempt to quit: 10/01/2014    Years since quitting: 3.4  . Smokeless tobacco: Former Network engineer Use Topics  . Alcohol use: Yes    Alcohol/week: 4.0 standard drinks    Types: 4 Cans of beer per week    Comment: occ  . Drug use: No     Family Hx: The patient's family history includes Cancer in his mother; Diabetes in his father; Heart attack (age of onset: 48) in his father; Heart disease in his father.  ROS:   Please see the history of present illness.    Review of Systems  HENT: Positive for hearing loss.   Cardiovascular: Positive for chest pain.  Respiratory: Positive for cough.    All other systems reviewed and are negative.   EKGs/Labs/Other Test Reviewed:    EKG:  EKG is   ordered today.  The ekg ordered today demonstrates NSR, HR 62, normal axis, NSSTTW changes, QTc 416, similar to old EKGs  Recent Labs: 03/16/2018: ALT 35; BUN 12; Creatinine, Ser 0.91; Hemoglobin 15.6; Platelets 205; Potassium 4.0; Sodium 139   Recent Lipid Panel Lab Results  Component Value Date/Time   CHOL 147 09/20/2017 08:55 AM   CHOL 173 01/22/2017 07:27 AM   TRIG 102.0 09/20/2017 08:55 AM   HDL 45.00 09/20/2017 08:55 AM   HDL 45 01/22/2017 07:27 AM   CHOLHDL 3 09/20/2017 08:55 AM   LDLCALC 82 09/20/2017 08:55 AM   LDLCALC 97 01/22/2017 07:27 AM   LDLDIRECT 68.0 11/19/2014 07:37 AM    Physical Exam:    VS:  BP 128/88   Pulse 61   Ht 5\' 8"  (1.727 m)   Wt 175 lb 8 oz (79.6 kg)   SpO2 97%   BMI 26.68 kg/m     Wt Readings from Last 3 Encounters:  03/16/18 175 lb 6.4 oz (79.6 kg)  03/16/18 175 lb 8 oz (79.6 kg)  09/17/17 180 lb 4 oz (81.8 kg)     Physical Exam    Constitutional: He is oriented to person, place, and time. He appears well-developed and well-nourished. No distress.  HENT:  Head: Normocephalic and atraumatic.  Eyes: No scleral icterus.  Neck: No JVD present. No thyromegaly present.  Cardiovascular: Normal rate and regular rhythm.  No murmur heard. Pulmonary/Chest: Effort normal. He has no wheezes. He has no rales.  Abdominal: Soft. He exhibits no distension.  Musculoskeletal: He exhibits no edema.  Lymphadenopathy:  He has no cervical adenopathy.  Neurological: He is alert and oriented to person, place, and time.  Skin: Skin is warm and dry.  Psychiatric: He has a normal mood and affect.    ASSESSMENT & PLAN:    Coronary artery disease involving native coronary artery of native heart with unstable angina pectoris ([place]) Hx of CABG in 2012 and DES to the LCx and OM2 and DES to the RCA in 2016.  He now presents with progressively worsening chest pain consistent with CCS Class III-IV angina.  His ECG is without any acute changes.  He is having some low level chest pain here in the office.  There are no open beds right now in tele[facility] at [facility].  -Admit to [facility] (transport via EMS)  -Continue ASA, Clopidogrel, Atorvastatin 80, Metoprolol 12.5 bid  -Start IV NTG  -Start IV Heparin  -Plan Cardiac Catheterization tomorrow (he just ate 1 hour ago)  -Cycle serial Troponin levels   Hyperlipidemia, unspecified hyperlipidemia type Continue high dose statin Rx.  Tobacco abuse   Recommend tobacco cessation.   Dispo:  Return for follow up after admission to the hospital.   Medication Adjustments/Labs and Tests Ordered: Current medicines are reviewed at length with the patient today.  Concerns regarding medicines are outlined above.  Tests Ordered: Orders Placed This Encounter  Procedures  . EKG 12-Lead   Medication Changes: No orders of the defined types were placed in this encounter.   Signed, Richardson Dopp, PA-C   03/16/2018 5:05 PM    [facility] [address], [place], [place]  [address] Phone: [phone]; Fax: [phone]

## 2018-03-16 NOTE — H&P (Signed)
Cardiology Admission History and Physical:   Patient ID: Derrick Stokes; MRN: [id number]; DOB: 03-16-1951   Admission date: 03/16/2018   Primary Care Provider: [name], [name], MD Primary Cardiologist: Derrick Mocha, MD   Chief Complaint  Patient presents with  . Chest Pain    Patient Profile:   Derrick Stokes is a 67 y.o. male with a history of coronary artery disease s/p CABG in 2012, s/p PCI with DES to the LCx and OM2 and DES to the RCA in 2016, hyperlipidemia, tobacco abuse who presents to the office for evaluation of chest pain.  History of Present Illness:   [name] complains of progressively worsening L sided chest heaviness over the past 2 weeks.  He notes discomfort with any activity now.  He generally has worsening pain throughout the day.  He usually has no pain when he awakens in the AM.  He denies assoc nausea, diaphoresis, radiation, shortness of breath.  He denies syncope, paroxysmal nocturnal dyspnea, leg swelling. He is smoking again.  He notes a chronic productive cough without change. He denies fevers.   Past Medical History:  Diagnosis Date  . Arthritis   . B12 deficiency    Borderline  . CAD (coronary artery disease)    a. s/p CABG 01/2011;  b. ETT Myoview 3/14:  Low risk;  c. 09/2014 Cath/PCI: LM nl, LAD 100p, LCX 81m/OM2 60-70p (2.75x28 Synergy DES), RCA dom, 33m (atherectomy, 3.5x24 Synergy DES), PDA nl, LIMA->LAD nl, VG->Diag nl, VG->OM 100, VG->RCA 100.  [name] Hiatal hernia   . History of echocardiogram    Echo 10/16: EF 50-55%, no RWMA, Gr 1 DD, mild MR, mild TR  . HLD (hyperlipidemia)   . Kidney stones   . Leg pain    ABIs 4/14:  R 1.2, L 1.2, TBIs normal  . Peripheral neuropathy    From trauma  . Tobacco abuse     Past Surgical History:  Procedure Laterality Date  . COLONOSCOPY     Neg  . CORONARY ARTERY BYPASS GRAFT    . EPIDURAL STEROIDS    . LEFT HEART CATHETERIZATION WITH CORONARY/GRAFT ANGIOGRAM N/A 09/18/2014   Procedure: LEFT HEART  CATHETERIZATION WITH [name];  Surgeon: Derrick Booze, MD;  Location: [facility] CATH LAB;  Service: Cardiovascular;  Laterality: N/A;  . PERCUTANEOUS CORONARY ROTOBLATOR INTERVENTION (PCI-R) N/A 09/20/2014   Procedure: PERCUTANEOUS CORONARY ROTOBLATOR INTERVENTION (PCI-R);  Surgeon: Derrick Booze, MD;  Location: [facility] CATH LAB;  Service: Cardiovascular;  Laterality: N/A;     Medications Prior to Admission: Prior to Admission medications   Medication Sig Start Date End Date Taking? Authorizing Provider  albuterol (PROVENTIL HFA;VENTOLIN HFA) 108 (90 Base) MCG/ACT inhaler Inhale 2 puffs into the lungs every 6 (six) hours as needed for wheezing or shortness of breath. 09/08/16   [name], [name], MD  aspirin EC 81 MG tablet Take 81 mg by mouth at bedtime.     [provider]  atorvastatin (LIPITOR) 80 MG tablet TAKE 1 TABLET (80 MG TOTAL) BY MOUTH DAILY AT 6 PM. 01/13/18   Derrick Mocha, MD  clopidogrel (PLAVIX) 75 MG tablet TAKE 1 TABLET BY MOUTH EVERY DAY WITH BREAKFAST 05/03/17   Derrick Mocha, MD  Cyanocobalamin (VITAMIN B-12 PO) Take 1,000 mg by mouth daily.    [provider]  MAGNESIUM PO Take 1,000 mg by mouth daily.    [provider]  metoprolol tartrate (LOPRESSOR) 25 MG tablet TAKE 1/2 TABLET BY MOUTH TWICE A DAY 08/03/17  Derrick Mocha, MD  nitroGLYCERIN (NITROSTAT) 0.4 MG SL tablet Place 1 tablet (0.4 mg total) under the tongue every 5 (five) minutes x 3 doses as needed for chest pain. 09/17/17   [name], [name], MD  pantoprazole (PROTONIX) 40 MG tablet TAKE 1 TABLET BY MOUTH EVERY DAY 03/23/17   Derrick Mocha, MD     Allergies:    Allergies  Allergen Reactions  . Iodine Nausea And Vomiting    Turns white in face and sweaty  . Iohexol Other (See Comments)    May have caused nausea and vomiting several years ago   . Shrimp [Shellfish Allergy] Nausea And Vomiting    Turns white in face and sweaty    Social History:   Social History     Socioeconomic History  . Marital status: Married    Spouse name: Not on file  . Number of children: Not on file  . Years of education: Not on file  . Highest education level: Not on file  Occupational History  . Occupation: Diasability  Social Needs  . Financial resource strain: Not on file  . Food insecurity:    Worry: Not on file    Inability: Not on file  . Transportation needs:    Medical: Not on file    Non-medical: Not on file  Tobacco Use  . Smoking status: Current Every Day Smoker    Packs/day: 1.00    Years: 45.00    Pack years: 45.00    Types: Cigarettes    Last attempt to quit: 10/01/2014    Years since quitting: 3.4  . Smokeless tobacco: Former Network engineer and Sexual Activity  . Alcohol use: Yes    Alcohol/week: 4.0 standard drinks    Types: 4 Cans of beer per week    Comment: occ  . Drug use: No  . Sexual activity: Yes  Lifestyle  . Physical activity:    Days per week: Not on file    Minutes per session: Not on file  . Stress: Not on file  Relationships  . Social connections:    Talks on phone: Not on file    Gets together: Not on file    Attends religious service: Not on file    Active member of club or organization: Not on file    Attends meetings of clubs or organizations: Not on file    Relationship status: Not on file  . Intimate partner violence:    Fear of current or ex partner: Not on file    Emotionally abused: Not on file    Physically abused: Not on file    Forced sexual activity: Not on file  Other Topics Concern  . Not on file  Social History Narrative   Married   Gets regular exercise - walks 1/4 mile per day 5 times a week without symptoms    Family History:   The patient's family history includes Cancer in his mother; Diabetes in his father; Heart attack (age of onset: 51) in his father; Heart disease in his father.    ROS:  Please see the history of present illness.  +chest pain +hearing loss +cough All other ROS  reviewed and negative.     Physical Exam/Data:  VS:  BP 128/88   Pulse 61   Ht 5\' 8"  (1.727 m)   Wt 175 lb 8 oz (79.6 kg)   SpO2 97%   BMI 26.68 kg/m     Wt Readings from Last 3 Encounters:  03/16/18 175 lb 8 oz (79.6 kg)  09/17/17 180 lb 4 oz (81.8 kg)  01/21/17 177 lb 12.8 oz (80.6 kg)     Physical Exam  Constitutional: He is oriented to person, place, and time. He appears well-developed and well-nourished. No distress.  HENT:  Head: Normocephalic and atraumatic.  Eyes: No scleral icterus.  Neck: No JVD present. No thyromegaly present.  Cardiovascular: Normal rate and regular rhythm.  No murmur heard. Pulmonary/Chest: Effort normal. He has no wheezes. He has no rales.  Abdominal: Soft. He exhibits no distension.  Musculoskeletal: He exhibits no edema.  Lymphadenopathy:    He has no cervical adenopathy.  Neurological: He is alert and oriented to person, place, and time.  Skin: Skin is warm and dry.  Psychiatric: He has a normal mood and affect.   EKG:  The ECG that was done 03/16/2018  was personally reviewed and demonstrates NSR, HR 62, normal axis, NSSTTW changes, QTc 416, similar to old EKGs   Relevant CV Studies: Echo 05/02/15 EF 50-55%, normal wall motion, grade 1 diastolic dysfunction, mild MR, normal RVSF, mild TR  Cardiac cath/staged PCI 09/18/14 LM: patent LCx: patent. Prox stent widely patent. RCA: heavily calcified mid vessel lesion, up to 90%. PCI: orbital atherectomy and 3.5 x 24 Synergy DES to RCA  Cardiac cath/PCI 09/20/14 LM: mild disease.  LAD: occluded proximally.  LCx: Mid 80%, proximal OM2 calcified 60-70% RCA: Mid calcified 80% LIMA-LAD: widely patent.  SVG-Dx: widely patent.  SVG-OM: occluded. SVG-RCA: occluded. LVEDP was 14 mmHg. PCI: 2.75 x 28 Synergy DES to LCx and OM2  Myoview 10/18/12 Inferior thinning versus prior infarct, no ischemia, EF 59%; low risk   Assessment and Plan:   Coronary artery disease involving native  coronary artery of native heart with unstable angina pectoris (HCC) Hx of CABG in 2012 and DES to the LCx and OM2 and DES to the RCA in 2016.  He now presents with progressively worsening chest pain consistent with CCS Class III-IV angina.  His ECG is without any acute changes.  He is having some low level chest pain here in the office.  There are no open beds right now in telemetry at [facility].  -Admit to [facility] (transport via EMS)  -Continue ASA, Clopidogrel, Atorvastatin 80, Metoprolol 12.5 bid  -Start IV NTG  -Start IV Heparin  -Plan Cardiac Catheterization tomorrow (he just ate 1 hour ago)  -Cycle serial Troponin levels   Hyperlipidemia, unspecified hyperlipidemia type Continue high dose statin Rx.  Tobacco abuse   Recommend tobacco cessation.   Severity of Illness: The appropriate patient status for this patient is OBSERVATION. Observation status is judged to be reasonable and necessary in order to provide the required intensity of service to ensure the patient's safety. The patient's presenting symptoms, physical exam findings, and initial radiographic and laboratory data in the context of their medical condition is felt to place them at decreased risk for further clinical deterioration. Furthermore, it is anticipated that the patient will be medically stable for discharge from the hospital within 2 midnights of admission. The following factors support the patient status of observation.   " The patient's presenting symptoms include chest pain. " The physical exam findings include n/a. " The initial radiographic and laboratory data are n/a.   For questions or updates, please contact [place] Please consult [email or web address] for contact info under Cardiology/STEMI.    Signed, Richardson Dopp, PA-C  03/16/2018 2:13 PM

## 2018-03-16 NOTE — Patient Instructions (Signed)
YOU ARE BEING ADMITTED TO [facility]

## 2018-03-16 NOTE — Progress Notes (Signed)
ANTICOAGULATION CONSULT NOTE - Initial Consult  Pharmacy Consult for heparin Indication: chest pain/ACS  Patient Measurements: Heparin Dosing Weight: 79.6 kg  Vital Signs: BP: 128/88 (08/28 1329) Pulse Rate: 61 (08/28 1329)  Labs: No results for input(s): HGB, HCT, PLT, APTT, LABPROT, INR, HEPARINUNFRC, HEPRLOWMOCWT, CREATININE, CKTOTAL, CKMB, TROPONINI in the last 72 hours.   Assessment: 67 yo male with left-sided chest heaviness, sent here from cardiology office. Patient has a history of a CABG and prior PCI x2. Planning to start heparin infusion, cardiac cath tomorrow.    Goal of Therapy:  Heparin level 0.3-0.7 units/ml Monitor platelets by anticoagulation protocol: Yes    Plan:  -Heparin bolus 4000 units x1 then 1050 units/hr -Daily HL, CBC -Check 6 hour level     Harvel Quale 03/16/2018,2:29 PM

## 2018-03-17 ENCOUNTER — Encounter (HOSPITAL_COMMUNITY): Payer: Self-pay | Admitting: Cardiovascular Disease

## 2018-03-17 ENCOUNTER — Encounter (HOSPITAL_COMMUNITY)
Admission: AD | Disposition: A | Discharge status: Home / Self Care | Payer: Self-pay | Source: Ambulatory Visit | Attending: Internal Medicine

## 2018-03-17 ENCOUNTER — Ambulatory Visit (HOSPITAL_COMMUNITY): Admission: RE | Admit: 2018-03-17 | Payer: Medicare HMO | Source: Ambulatory Visit | Admitting: Cardiovascular Disease

## 2018-03-17 DIAGNOSIS — E785 Hyperlipidemia, unspecified: Secondary | ICD-10-CM | POA: Diagnosis not present

## 2018-03-17 DIAGNOSIS — R69 Illness, unspecified: Secondary | ICD-10-CM | POA: Diagnosis not present

## 2018-03-17 DIAGNOSIS — G629 Polyneuropathy, unspecified: Secondary | ICD-10-CM | POA: Diagnosis not present

## 2018-03-17 DIAGNOSIS — M1990 Unspecified osteoarthritis, unspecified site: Secondary | ICD-10-CM | POA: Diagnosis not present

## 2018-03-17 DIAGNOSIS — Z8249 Family history of ischemic heart disease and other diseases of the circulatory system: Secondary | ICD-10-CM | POA: Diagnosis not present

## 2018-03-17 DIAGNOSIS — I25118 Atherosclerotic heart disease of native coronary artery with other forms of angina pectoris: Secondary | ICD-10-CM | POA: Diagnosis not present

## 2018-03-17 DIAGNOSIS — I25110 Atherosclerotic heart disease of native coronary artery with unstable angina pectoris: Secondary | ICD-10-CM | POA: Diagnosis not present

## 2018-03-17 DIAGNOSIS — Z955 Presence of coronary angioplasty implant and graft: Secondary | ICD-10-CM | POA: Diagnosis not present

## 2018-03-17 DIAGNOSIS — Z7982 Long term (current) use of aspirin: Secondary | ICD-10-CM | POA: Diagnosis not present

## 2018-03-17 DIAGNOSIS — Z7902 Long term (current) use of antithrombotics/antiplatelets: Secondary | ICD-10-CM | POA: Diagnosis not present

## 2018-03-17 DIAGNOSIS — R001 Bradycardia, unspecified: Secondary | ICD-10-CM | POA: Diagnosis not present

## 2018-03-17 DIAGNOSIS — I2510 Atherosclerotic heart disease of native coronary artery without angina pectoris: Secondary | ICD-10-CM

## 2018-03-17 HISTORY — PX: INTRAVASCULAR PRESSURE WIRE/FFR STUDY: CATH118243

## 2018-03-17 HISTORY — PX: LEFT HEART CATH AND CORONARY ANGIOGRAPHY: CATH118249

## 2018-03-17 LAB — LIPID PANEL
CHOL/HDL RATIO: 3 ratio
Cholesterol: 128 mg/dL (ref 0–200)
HDL: 42 mg/dL (ref >40)
LDL Cholesterol: 67 mg/dL (ref 0–99)
TRIGLYCERIDES: 94 mg/dL (ref <150)
VLDL: 19 mg/dL (ref 0–40)

## 2018-03-17 LAB — POCT ACTIVATED CLOTTING TIME
ACTIVATED CLOTTING TIME: 279 s
ACTIVATED CLOTTING TIME: 158 s
Activated Clotting Time: 296 seconds

## 2018-03-17 LAB — CBC
HEMATOCRIT: 41.7 % (ref 39.0–52.0)
HEMOGLOBIN: 13.9 g/dL (ref 13.0–17.0)
MCH: 31.4 pg (ref 26.0–34.0)
MCHC: 33.3 g/dL (ref 30.0–36.0)
MCV: 94.3 fL (ref 78.0–100.0)
Platelets: 175 10*3/uL (ref 150–400)
RBC: 4.42 MIL/uL (ref 4.22–5.81)
RDW: 12.6 % (ref 11.5–15.5)
WBC: 7.8 10*3/uL (ref 4.0–10.5)

## 2018-03-17 LAB — TROPONIN I

## 2018-03-17 LAB — HEPARIN LEVEL (UNFRACTIONATED): HEPARIN UNFRACTIONATED: 0.62 [IU]/mL (ref 0.30–0.70)

## 2018-03-17 LAB — HIV ANTIBODY (ROUTINE TESTING W REFLEX): HIV Screen 4th Generation wRfx: NONREACTIVE

## 2018-03-17 SURGERY — LEFT HEART CATH AND CORONARY ANGIOGRAPHY
Anesthesia: LOCAL

## 2018-03-17 MED ORDER — ONDANSETRON HCL 4 MG/2ML IJ SOLN: 4.0000 mg | Freq: Four times a day (QID) | INTRAMUSCULAR | Status: DC | PRN

## 2018-03-17 MED ORDER — ASPIRIN 81 MG PO CHEW
81.0000 mg | CHEWABLE_TABLET | ORAL | Status: AC
  Administered 2018-03-17: 81 mg via ORAL
  Filled 2018-03-17: qty 1

## 2018-03-17 MED ORDER — HEPARIN (PORCINE) IN NACL 1000-0.9 UT/500ML-% IV SOLN
INTRAVENOUS | Status: DC | PRN
  Administered 2018-03-17 (×2): 500 mL

## 2018-03-17 MED ORDER — LIDOCAINE HCL (PF) 1 % IJ SOLN
INTRAMUSCULAR | Status: DC | PRN
  Administered 2018-03-17: 15 mL via SUBCUTANEOUS

## 2018-03-17 MED ORDER — SODIUM CHLORIDE 0.9% FLUSH
3.0000 mL | Freq: Two times a day (BID) | INTRAVENOUS | Status: DC
  Administered 2018-03-17: 17:00:00 3 mL via INTRAVENOUS

## 2018-03-17 MED ORDER — METHYLPREDNISOLONE SODIUM SUCC 125 MG IJ SOLR
INTRAMUSCULAR | Status: AC
  Filled 2018-03-17: qty 2

## 2018-03-17 MED ORDER — ADENOSINE (DIAGNOSTIC) 140MCG/KG/MIN
INTRAVENOUS | Status: DC | PRN
  Administered 2018-03-17: 140 ug/kg/min via INTRAVENOUS

## 2018-03-17 MED ORDER — SODIUM CHLORIDE 0.9% FLUSH: 3.0000 mL | INTRAVENOUS | Status: DC | PRN

## 2018-03-17 MED ORDER — CLOPIDOGREL BISULFATE 75 MG PO TABS
75.0000 mg | ORAL_TABLET | Freq: Every day | ORAL | Status: DC
  Administered 2018-03-17 – 2018-03-18 (×2): 75 mg via ORAL
  Filled 2018-03-17 (×2): qty 1

## 2018-03-17 MED ORDER — HEPARIN SODIUM (PORCINE) 1000 UNIT/ML IJ SOLN
INTRAMUSCULAR | Status: DC | PRN
  Administered 2018-03-17: 8000 [IU] via INTRAVENOUS
  Administered 2018-03-17: 2000 [IU] via INTRAVENOUS

## 2018-03-17 MED ORDER — HYDRALAZINE HCL 20 MG/ML IJ SOLN: 5.0000 mg | INTRAMUSCULAR | Status: AC | PRN

## 2018-03-17 MED ORDER — ASPIRIN 81 MG PO CHEW
81.0000 mg | CHEWABLE_TABLET | Freq: Every day | ORAL | Status: DC
  Administered 2018-03-18: 10:00:00 81 mg via ORAL
  Filled 2018-03-17 (×2): qty 1

## 2018-03-17 MED ORDER — SODIUM CHLORIDE 0.9 % IV SOLN: 250.0000 mL | INTRAVENOUS | Status: DC | PRN

## 2018-03-17 MED ORDER — SODIUM CHLORIDE 0.9% FLUSH: 3.0000 mL | Freq: Two times a day (BID) | INTRAVENOUS | Status: DC

## 2018-03-17 MED ORDER — HEPARIN (PORCINE) IN NACL 1000-0.9 UT/500ML-% IV SOLN
INTRAVENOUS | Status: AC
  Filled 2018-03-17: qty 1000

## 2018-03-17 MED ORDER — LIDOCAINE HCL (PF) 1 % IJ SOLN
INTRAMUSCULAR | Status: AC
  Filled 2018-03-17: qty 30

## 2018-03-17 MED ORDER — SODIUM CHLORIDE 0.9 % WEIGHT BASED INFUSION
3.0000 mL/kg/h | INTRAVENOUS | Status: DC
  Administered 2018-03-17 (×2): 3 mL/kg/h via INTRAVENOUS

## 2018-03-17 MED ORDER — ACETAMINOPHEN 325 MG PO TABS: 650.0000 mg | ORAL_TABLET | ORAL | Status: DC | PRN

## 2018-03-17 MED ORDER — DIPHENHYDRAMINE HCL 50 MG/ML IJ SOLN
INTRAMUSCULAR | Status: DC | PRN
  Administered 2018-03-17: 25 mg via INTRAVENOUS

## 2018-03-17 MED ORDER — ADENOSINE 12 MG/4ML IV SOLN
INTRAVENOUS | Status: AC
  Filled 2018-03-17: qty 16

## 2018-03-17 MED ORDER — LABETALOL HCL 5 MG/ML IV SOLN: 10.0000 mg | INTRAVENOUS | Status: AC | PRN

## 2018-03-17 MED ORDER — METHYLPREDNISOLONE SODIUM SUCC 125 MG IJ SOLR
INTRAMUSCULAR | Status: DC | PRN
  Administered 2018-03-17: 125 mg via INTRAVENOUS

## 2018-03-17 MED ORDER — IOHEXOL 350 MG/ML SOLN
INTRAVENOUS | Status: DC | PRN
  Administered 2018-03-17: 125 mL via INTRA_ARTERIAL

## 2018-03-17 MED ORDER — SODIUM CHLORIDE 0.9 % IV SOLN
INTRAVENOUS | Status: AC
  Administered 2018-03-17: 09:00:00 via INTRAVENOUS

## 2018-03-17 MED ORDER — SODIUM CHLORIDE 0.9 % WEIGHT BASED INFUSION: 1.0000 mL/kg/h | INTRAVENOUS | Status: DC

## 2018-03-17 MED ORDER — DIPHENHYDRAMINE HCL 50 MG/ML IJ SOLN
INTRAMUSCULAR | Status: AC
  Filled 2018-03-17: qty 1

## 2018-03-17 MED ORDER — MORPHINE SULFATE (PF) 2 MG/ML IV SOLN
2.0000 mg | INTRAVENOUS | Status: DC | PRN
  Administered 2018-03-17: 2 mg via INTRAVENOUS
  Filled 2018-03-17: qty 1

## 2018-03-17 SURGICAL SUPPLY — 14 items
CATH INFINITI 5FR MULTPACK ANG (CATHETERS) ×1 IMPLANT
CATH MICROCATH NAVVUS (MICROCATHETER) IMPLANT
CATH VISTA GUIDE 6FR XB3.5 (CATHETERS) ×1 IMPLANT
KIT HEART LEFT (KITS) ×2 IMPLANT
MICROCATHETER NAVVUS (MICROCATHETER) ×2
PACK CARDIAC CATHETERIZATION (CUSTOM PROCEDURE TRAY) ×2 IMPLANT
PATCH THROMBIX TOPICAL PLAIN (HEMOSTASIS) ×2 IMPLANT
SHEATH PINNACLE 5F 10CM (SHEATH) ×1 IMPLANT
SHEATH PINNACLE 6F 10CM (SHEATH) ×2 IMPLANT
SYR MEDRAD MARK V 150ML (SYRINGE) ×2 IMPLANT
TRANSDUCER W/STOPCOCK (MISCELLANEOUS) ×2 IMPLANT
TUBING CIL FLEX 10 FLL-RA (TUBING) ×2 IMPLANT
WIRE ASAHI PROWATER 180CM (WIRE) ×1 IMPLANT
WIRE EMERALD 3MM-J .035X150CM (WIRE) ×1 IMPLANT

## 2018-03-17 NOTE — Progress Notes (Signed)
Pt comfortable lying in bed R groin with sheath in still  WIll review with Dr Gwenlyn Found re possible discharge later today or tomorrow.  Dorris Carnes

## 2018-03-17 NOTE — Interval H&P Note (Signed)
Cath Lab Visit (complete for each Cath Lab visit)  Clinical Evaluation Leading to the Procedure:   ACS: No.  Non-ACS:    Anginal Classification: CCS III  Anti-ischemic medical therapy: Maximal Therapy (2 or more classes of medications)  Non-Invasive Test Results: No non-invasive testing performed  Prior CABG: Previous CABG      History and Physical Interval Note:  03/17/2018 7:32 AM  Derrick Stokes  has presented today for surgery, with the diagnosis of cp  The various methods of treatment have been discussed with the patient and family. After consideration of risks, benefits and other options for treatment, the patient has consented to  Procedure(s): LEFT HEART CATH AND CORONARY ANGIOGRAPHY (N/A) as a surgical intervention .  The patient's history has been reviewed, patient examined, no change in status, stable for surgery.  I have reviewed the patient's chart and labs.  Questions were answered to the patient's satisfaction.     Quay Burow

## 2018-03-17 NOTE — Progress Notes (Signed)
ANTICOAGULATION CONSULT NOTE -  Pharmacy Consult for Heparin Indication: chest pain/ACS  Patient Measurements: Heparin Dosing Weight: 79.6 kg  Vital Signs: Temp: 97.6 F (36.4 C) (08/28 2038) Temp Source: Oral (08/28 2038) BP: 97/73 (08/29 0102) Pulse Rate: 49 (08/28 2038)  Labs: Recent Labs    03/16/18 1507 03/16/18 2030 03/17/18 0003  HGB 15.6  --  13.9  HCT 45.1  --  41.7  PLT 205  --  175  APTT 25  --   --   LABPROT 13.7  --   --   INR 1.06  --   --   HEPARINUNFRC  --   --  0.62  CREATININE 0.91  --   --   TROPONINI <0.03 <0.03 <0.03     Assessment: 67 yo male with left-sided chest heaviness, sent here from cardiology office. Patient has a history of a CABG and prior PCI x2. Planning to start heparin infusion, cardiac cath tomorrow.   8/29 AM update: initial heparin level therapeutic   Goal of Therapy:  Heparin level 0.3-0.7 units/ml Monitor platelets by anticoagulation protocol: Yes    Plan:  -Cont heparin at 1050 units/hr -1000 heparin level  Narda Bonds 03/17/2018,1:28 AM

## 2018-03-17 NOTE — Care Management Obs Status (Signed)
[place] NOTIFICATION   Patient Details  Name: Derrick Stokes MRN: [id number] Date of Birth: Jan 13, 1951   Medicare Observation Status Notification Given:  Yes    Zenon Mayo, RN 03/17/2018, 4:40 PM

## 2018-03-17 NOTE — Progress Notes (Signed)
Site area: right groin  Site Prior to Removal:  Level 0  Pressure Applied For 20 MINUTES    Minutes Beginning at 1235  Manual:   Yes.    Patient Status During Pull:  stable  Post Pull Groin Site:  Level 0  Post Pull Instructions Given:  Yes.    Post Pull Pulses Present:  Yes.    Dressing Applied:  Yes.    Comments:

## 2018-03-17 NOTE — Care Management Obs Status (Deleted)
[place] NOTIFICATION   Patient Details  Name: RAVINDER LUKEHART MRN: [id number] Date of Birth: 1950/07/30   Medicare Observation Status Notification Given:       Zenon Mayo, RN 03/17/2018, 4:40 PM

## 2018-03-18 ENCOUNTER — Observation Stay (HOSPITAL_COMMUNITY): Payer: Medicare HMO

## 2018-03-18 ENCOUNTER — Encounter (HOSPITAL_COMMUNITY): Payer: Self-pay | Admitting: Cardiology

## 2018-03-18 DIAGNOSIS — I25110 Atherosclerotic heart disease of native coronary artery with unstable angina pectoris: Secondary | ICD-10-CM | POA: Diagnosis not present

## 2018-03-18 DIAGNOSIS — J45909 Unspecified asthma, uncomplicated: Secondary | ICD-10-CM | POA: Diagnosis present

## 2018-03-18 DIAGNOSIS — G629 Polyneuropathy, unspecified: Secondary | ICD-10-CM | POA: Diagnosis not present

## 2018-03-18 DIAGNOSIS — Z8249 Family history of ischemic heart disease and other diseases of the circulatory system: Secondary | ICD-10-CM | POA: Diagnosis not present

## 2018-03-18 DIAGNOSIS — Z7902 Long term (current) use of antithrombotics/antiplatelets: Secondary | ICD-10-CM | POA: Diagnosis not present

## 2018-03-18 DIAGNOSIS — I2510 Atherosclerotic heart disease of native coronary artery without angina pectoris: Secondary | ICD-10-CM | POA: Diagnosis not present

## 2018-03-18 DIAGNOSIS — R001 Bradycardia, unspecified: Secondary | ICD-10-CM | POA: Diagnosis not present

## 2018-03-18 DIAGNOSIS — E785 Hyperlipidemia, unspecified: Secondary | ICD-10-CM | POA: Diagnosis not present

## 2018-03-18 DIAGNOSIS — M1990 Unspecified osteoarthritis, unspecified site: Secondary | ICD-10-CM | POA: Diagnosis not present

## 2018-03-18 DIAGNOSIS — R079 Chest pain, unspecified: Secondary | ICD-10-CM | POA: Diagnosis not present

## 2018-03-18 DIAGNOSIS — Z955 Presence of coronary angioplasty implant and graft: Secondary | ICD-10-CM | POA: Diagnosis not present

## 2018-03-18 DIAGNOSIS — Z7982 Long term (current) use of aspirin: Secondary | ICD-10-CM | POA: Diagnosis not present

## 2018-03-18 DIAGNOSIS — R69 Illness, unspecified: Secondary | ICD-10-CM | POA: Diagnosis not present

## 2018-03-18 HISTORY — DX: Unspecified asthma, uncomplicated: J45.909

## 2018-03-18 LAB — CBC
HCT: 41.1 % (ref 39.0–52.0)
Hemoglobin: 14 g/dL (ref 13.0–17.0)
MCH: 31.5 pg (ref 26.0–34.0)
MCHC: 34.1 g/dL (ref 30.0–36.0)
MCV: 92.4 fL (ref 78.0–100.0)
PLATELETS: 202 10*3/uL (ref 150–400)
RBC: 4.45 MIL/uL (ref 4.22–5.81)
RDW: 12.3 % (ref 11.5–15.5)
WBC: 13.2 10*3/uL — ABNORMAL HIGH (ref 4.0–10.5)

## 2018-03-18 LAB — BASIC METABOLIC PANEL
Anion gap: 8 (ref 5–15)
BUN: 16 mg/dL (ref 8–23)
CO2: 21 mmol/L — ABNORMAL LOW (ref 22–32)
CREATININE: 0.84 mg/dL (ref 0.61–1.24)
Calcium: 8.9 mg/dL (ref 8.9–10.3)
Chloride: 107 mmol/L (ref 98–111)
Glucose, Bld: 115 mg/dL — ABNORMAL HIGH (ref 70–99)
Potassium: 4 mmol/L (ref 3.5–5.1)
SODIUM: 136 mmol/L (ref 135–145)

## 2018-03-18 LAB — BRAIN NATRIURETIC PEPTIDE: B NATRIURETIC PEPTIDE 5: 67 pg/mL (ref 0.0–100.0)

## 2018-03-18 MED ORDER — IPRATROPIUM-ALBUTEROL 0.5-2.5 (3) MG/3ML IN SOLN: 3.0000 mL | RESPIRATORY_TRACT | Status: DC

## 2018-03-18 MED ORDER — IPRATROPIUM BROMIDE HFA 17 MCG/ACT IN AERS: 2.0000 | INHALATION_SPRAY | RESPIRATORY_TRACT | 12 refills | Status: DC

## 2018-03-18 NOTE — Progress Notes (Signed)
CARDIAC REHAB PHASE I   PRE:  Rate/Rhythm: 68 SR    BP: sitting 107/51    SaO2:   MODE:  Ambulation: 1000 ft   POST:  Rate/Rhythm: 86 SR    BP: sitting 119/67     SaO2: 98 RA  Pt c/o left chest heaviness after walking 400 ft. He had also been moving his left shoulder in circles while walking as he stated he felt like his presenting sx were sometimes r/t movement. He has been building on a room in his house. Todays chest heaviness resolved with distance. He feels well. He has also noted that his cough is reduced since he has been in the hospital and not smoking. We had a long discussions (pt and wife) regarding smoking cessation. Benefits, methods, making a plan. They both smoke and are thinking about it. He has a fake cigarette at home. Reviewed diet and ex and NTG as well. N/a for CRPII at this time.  Tracy, ACSM 03/18/2018 9:10 AM

## 2018-03-18 NOTE — Progress Notes (Addendum)
Progress Note  Patient Name: Derrick Stokes Date of Encounter: 03/18/2018  Primary Cardiologist: Sherren Mocha, MD   Subjective   With walk this AM some Lt chest heaviness.  resolved though he continued to walk.  This is how it feels initially then increases during the day.  No symptoms like with Barrett's esophagus.  Pt has had a lot of coughing more than usual and this may be causing pain.     Inpatient Medications    Scheduled Meds: . aspirin  81 mg Oral Daily  . atorvastatin  80 mg Oral q1800  . clopidogrel  75 mg Oral Q breakfast  . metoprolol tartrate  12.5 mg Oral BID  . pantoprazole  40 mg Oral Daily  . sodium chloride flush  3 mL Intravenous Q12H  . sodium chloride flush  3 mL Intravenous Q12H   Continuous Infusions: . sodium chloride    . sodium chloride    . nitroGLYCERIN Stopped (03/17/18 0831)   PRN Meds: sodium chloride, sodium chloride, acetaminophen, albuterol, morphine injection, nitroGLYCERIN, ondansetron (ZOFRAN) IV, sodium chloride flush, sodium chloride flush   Vital Signs    Vitals:   03/17/18 1958 03/17/18 2000 03/17/18 2127 03/18/18 0410  BP: 103/62 103/62 (!) 111/58 101/70  Pulse: 74 72 68 (!) 57  Resp: 18 (!) 27  15  Temp: 97.7 F (36.5 C)   97.7 F (36.5 C)  TempSrc: Oral   Oral  SpO2: 93% 91%  97%  Weight:    78.4 kg  Height:        Intake/Output Summary (Last 24 hours) at 03/18/2018 0828 Last data filed at 03/17/2018 2200 Gross per 24 hour  Intake 731.16 ml  Output 925 ml  Net -193.84 ml   Filed Weights   03/16/18 1518 03/17/18 0607 03/18/18 0410  Weight: 79.6 kg 78.9 kg 78.4 kg    Telemetry    SR to mild SB - Personally Reviewed  ECG    SB no acute changes. - Personally Reviewed  Physical Exam   GEN: No acute distress.   Neck: No JVD Cardiac: RRR, no murmurs, rubs, or gallops. Rt groin cath site without hematoma. Respiratory: Clear to auscultation bilaterally. GI: Soft, nontender, non-distended  MS: No edema;  No deformity. FROM LT arm, no pain to palpation of Lt chest or shoulder.  Neuro:  Nonfocal  Psych: Normal affect   Labs    Chemistry Recent Labs  Lab 03/16/18 1507 03/18/18 0226  NA 139 136  K 4.0 4.0  CL 103 107  CO2 27 21*  GLUCOSE 107* 115*  BUN 12 16  CREATININE 0.91 0.84  CALCIUM 9.6 8.9  PROT 6.9  --   ALBUMIN 3.9  --   AST 25  --   ALT 35  --   ALKPHOS 63  --   BILITOT 0.7  --   GFRNONAA >60 >60  GFRAA >60 >60  ANIONGAP 9 8     Hematology Recent Labs  Lab 03/16/18 1507 03/17/18 0003 03/18/18 0226  WBC 7.8 7.8 13.2*  RBC 4.84 4.42 4.45  HGB 15.6 13.9 14.0  HCT 45.1 41.7 41.1  MCV 93.2 94.3 92.4  MCH 32.2 31.4 31.5  MCHC 34.6 33.3 34.1  RDW 12.7 12.6 12.3  PLT 205 175 202    Cardiac Enzymes Recent Labs  Lab 03/16/18 1507 03/16/18 2030 03/17/18 0003  TROPONINI <0.03 <0.03 <0.03   No results for input(s): TROPIPOC in the last 168 hours.   BNPNo results for  input(s): BNP, PROBNP in the last 168 hours.   DDimer No results for input(s): DDIMER in the last 168 hours.   Radiology    No results found.  Cardiac Studies   03/17/18 Cardiac cath   Previously placed Prox RCA to Mid RCA stent (unknown type) is widely patent.  Ost LAD lesion is 100% stenosed.  Origin to Prox Graft lesion is 100% stenosed.  Origin lesion is 100% stenosed.  Ost Cx to Mid Cx lesion is 50% stenosed.  The left ventricular systolic function is normal.  LV end diastolic pressure is normal.  The left ventricular ejection fraction is 55-65% by visual estimate.   No culprit lesion.  His vein graft to diagonal branch and LIMA to the LAD were widely patent.  Previously demonstrated occlusion of the RCA and obtuse marginal branch vein grafts.  The stent to the RCA is widely patent.  The stent to the circumflex is widely patent with a small area of in-stent restenosis of at most 40 to 50%.  FFR of this area was only 0.88 suggesting that it was not physiologically  significant.  His LV function was normal.  There is nothing in his coronary anatomy to suggest that he has coronary insufficiency attributed to his chest pain.    Patient Profile     67 y.o. male with a history of coronary artery disease s/p CABG in 2012, s/p PCI with DES to the LCx and OM2 and DES to the RCA in 2016, hyperlipidemia, tobacco abuse admitted for increasing chest pain.   Assessment & Plan   Chest pain with hx CAD and CABG--pt admitted meds continued and IV NTG and Heparin added.  Pt underwent cardiac cath with no cardiac cause of chest pain.     --neg troponin --will check CXR with tobacco hx.   --may be muscular skeletal.  Has had increased coughing.  --Dr. Harrington Challenger to see, if CXR neg, discharge ? Treatment plan for pain.  HLD LDL controlled continue current dose of statin  Tobacco abuse. Discussed importance of stopping.   Hx of Barrett's esophagus.  He denies that symptom's are similar.    For questions or updates, please contact [place] Please consult [email or web address] for contact info under Cardiology/STEMI.      Signed, Cecilie Kicks, NP  03/18/2018, 8:28 AM   Patient seen and examined   I agree with findings as noted above by L Ingold   Pt walking in hall earlier today   Had chest heavinesss   He continued waling   Now gone  On exam:   LUngs with rhonchi   Some wheeze   Pt with tight cough  Cardiac exam:   RRR   No Murmurs   Ext without edema  CXR shows no infiltrate or CHF     BNP is normal  I think pt's symptoms are from reactive airways   Would Rx for COPD with inhalers   Counselled on smoking cessation   He has quit twice in past cold [place].  OK to d/c today     Dorris Carnes   P

## 2018-03-18 NOTE — Discharge Summary (Signed)
Discharge Summary    Patient ID: Derrick Stokes,  MRN: [id number], DOB/AGE: June 08, 1951 67 y.o.  Admit date: 03/16/2018 Discharge date: 03/18/2018  Primary Care Provider: [name], [name] Primary Cardiologist: Sherren Mocha, MD  Discharge Diagnoses    Principal Problem:   Chest pain wtih stable CAD at cath.   Active Problems:   Reactive airway disease that is not asthma, has been coughing more, may be cause of chest pain   Hyperlipidemia   CAD (coronary artery disease), native coronary artery   Reactive airway disease   Allergies Allergies  Allergen Reactions  . Iodine Nausea And Vomiting    Turns white in face and sweaty  . Iohexol Other (See Comments)    May have caused nausea and vomiting several years ago   . Shrimp [Shellfish Allergy] Nausea And Vomiting    Turns white in face and sweaty    Diagnostic Studies/Procedures    03/17/18 cardiac cath  Previously placed Prox RCA to Mid RCA stent (unknown type) is widely patent.  Ost LAD lesion is 100% stenosed.  Origin to Prox Graft lesion is 100% stenosed.  Origin lesion is 100% stenosed.  Ost Cx to Mid Cx lesion is 50% stenosed.  The left ventricular systolic function is normal.  LV end diastolic pressure is normal.  The left ventricular ejection fraction is 55-65% by visual estimate.    no culprit lesion.  His vein graft to diagonal branch and LIMA to the LAD were widely patent.  Previously demonstrated occlusion of the RCA and obtuse marginal branch vein grafts.  The stent to the RCA is widely patent.  The stent to the circumflex is widely patent with a small area of in-stent restenosis of at most 40 to 50%.  FFR of this area was only 0.88 suggesting that it was not physiologically significant.  His LV function was normal.  There is nothing in his coronary anatomy to suggest that he has coronary insufficiency attributed to his chest pain.     Diagnostic Diagram       _____________   History of  Present Illness     62 yom seen in office yesterday 03/16/18 with progressive Lt.  chest pain with exertion.  Began about 2 weeks ago.  No associated symptoms but he haas been coughing more often. He does smoke.  He has albuterol inhaler.      His EKG was without changes and he did have pain in the office.  Due to his concerning pain for unstable angina he was placed on IV heparin and NTG with admit to [facility].   Plan would be for cardiac cath on the 29th.   Other hx of tobacco use, HLD, B 12 def. Has had barets esophagitis in past.  This feels differently.   Hospital Course     Consultants: none   Pt was admitted and serial troponin were neg for MI.  He underwent cardiac cath, and no source of chest pain cause was found.  His Coronary disease was stable see above note.   When he walked with rehab he did have some Lt chest pressure.  We did BNP that was 67 and CXR PA and Lat with no acute process and no HF.  We are adding atrovent inhaler to his albuterol inhaler.    Cardiac rehab and Dr. Harrington Challenger discussed importance in stopping tobacco.   He will follow up as outpt.  He was seen and found stable for discharge by Dr. Harrington Challenger.  _____________  Discharge Vitals Blood pressure 115/76, pulse (!) 49, temperature 97.6 F (36.4 C), resp. rate 20, height 5\' 8"  (1.727 m), weight 78.4 kg, SpO2 99 %.  Filed Weights   03/16/18 1518 03/17/18 0607 03/18/18 0410  Weight: 79.6 kg 78.9 kg 78.4 kg    Labs & Radiologic Studies    CBC Recent Labs    03/16/18 1507 03/17/18 0003 03/18/18 0226  WBC 7.8 7.8 13.2*  NEUTROABS 4.3  --   --   HGB 15.6 13.9 14.0  HCT 45.1 41.7 41.1  MCV 93.2 94.3 92.4  PLT 205 175 948   Basic Metabolic Panel Recent Labs    03/16/18 1507 03/18/18 0226  NA 139 136  K 4.0 4.0  CL 103 107  CO2 27 21*  GLUCOSE 107* 115*  BUN 12 16  CREATININE 0.91 0.84  CALCIUM 9.6 8.9   Liver Function Tests Recent Labs    03/16/18 1507  AST 25  ALT 35  ALKPHOS 63  BILITOT 0.7    PROT 6.9  ALBUMIN 3.9   No results for input(s): LIPASE, AMYLASE in the last 72 hours. Cardiac Enzymes Recent Labs    03/16/18 1507 03/16/18 2030 03/17/18 0003  TROPONINI <0.03 <0.03 <0.03   BNP Invalid input(s): POCBNP D-Dimer No results for input(s): DDIMER in the last 72 hours. Hemoglobin A1C No results for input(s): HGBA1C in the last 72 hours. Fasting Lipid Panel Recent Labs    03/17/18 0003  CHOL 128  HDL 42  LDLCALC 67  TRIG 94  CHOLHDL 3.0   Thyroid Function Tests No results for input(s): TSH, T4TOTAL, T3FREE, THYROIDAB in the last 72 hours.  Invalid input(s): FREET3 _____________  Dg Chest 2 View  Result Date: 03/18/2018 CLINICAL DATA:  Acute chest pain.  Prior CABG. EXAM: CHEST - 2 VIEW COMPARISON:  09/16/2014, 12/28/2013 and earlier. FINDINGS: Prior sternotomy for CABG. Cardiac silhouette normal in size, unchanged. Thoracic aorta mildly tortuous and atherosclerotic, unchanged. Hilar and mediastinal contours otherwise unremarkable. Lungs clear. Bronchovascular markings normal. Pulmonary vascularity normal. No visible pleural effusions. No pneumothorax. Remote fracture involving the LOWER endplate of T6, unchanged. IMPRESSION: No acute cardiopulmonary disease. Electronically Signed   By: Evangeline Dakin M.D.   On: 03/18/2018 11:16   Disposition   Pt is being discharged home today in good condition.  Follow-up Plans & Appointments   Call [facility] at [phone] if any bleeding, swelling or drainage at cath site.  May shower, no tub baths for 48 hours for groin sticks. No lifting over 5 pounds for 3 days.  No Driving for 3 days  Heart Healthy Diet.   It would be beneficial for you to stop tobacco use.  Please discuss with PCP or on your cardiology follow up visit if you need medication.    Call if any problems or questions.  Your magnesium and B12 were stopped due to extremely high dose.  Please check with your PCP on correct dose  for you.     Follow-up Information    Sherren Mocha, MD Follow up on 04/08/2018.   Specialty:  Cardiology Why:  at 3:30 PM  with his Nurse Practitioner Pecolia Ades. Contact information: [address] [address] [phone]          Discharge Instructions    AMB Referral to Cardiac Rehabilitation - Phase II   Complete by:  As directed    Diagnosis:  Stable Angina  Discharge Medications   Allergies as of 03/18/2018      Reactions   Iodine Nausea And Vomiting   Turns white in face and sweaty   Iohexol Other (See Comments)   May have caused nausea and vomiting several years ago    Shrimp [shellfish Allergy] Nausea And Vomiting   Turns white in face and sweaty      Medication List    STOP taking these medications   MAGNESIUM PO   VITAMIN B-12 PO     TAKE these medications   albuterol 108 (90 Base) MCG/ACT inhaler Commonly known as:  PROVENTIL HFA;VENTOLIN HFA Inhale 2 puffs into the lungs every 6 (six) hours as needed for wheezing or shortness of breath.   aspirin EC 81 MG tablet Take 81 mg by mouth at bedtime.   atorvastatin 80 MG tablet Commonly known as:  LIPITOR TAKE 1 TABLET (80 MG TOTAL) BY MOUTH DAILY AT 6 PM.   clopidogrel 75 MG tablet Commonly known as:  PLAVIX TAKE 1 TABLET BY MOUTH EVERY DAY WITH BREAKFAST   Fish Oil 1000 MG Cpdr Take 1 capsule by mouth every morning.   ipratropium 17 MCG/ACT inhaler Commonly known as:  ATROVENT HFA Inhale 2 puffs into the lungs every 4 (four) hours.   metoprolol tartrate 25 MG tablet Commonly known as:  LOPRESSOR TAKE 1/2 TABLET BY MOUTH TWICE A DAY   nitroGLYCERIN 0.4 MG SL tablet Commonly known as:  NITROSTAT Place 1 tablet (0.4 mg total) under the tongue every 5 (five) minutes x 3 doses as needed for chest pain.   pantoprazole 40 MG tablet Commonly known as:  PROTONIX TAKE 1 TABLET BY MOUTH EVERY DAY   SALONSIP AQUA-PATCH EX Apply 1 patch topically as needed  (back pain).        Acute coronary syndrome (MI, NSTEMI, STEMI, etc) this admission?: No.    Outstanding Labs/Studies  No.   Duration of Discharge Encounter   Greater than 30 minutes including physician time.  Signed, Cecilie Kicks, NP 03/18/2018, 1:07 PM

## 2018-03-18 NOTE — Discharge Instructions (Addendum)
Call [facility] at [phone] if any bleeding, swelling or drainage at cath site.  May shower, no tub baths for 48 hours for groin sticks. No lifting over 5 pounds for 3 days.  No Driving for 3 days  Heart Healthy Diet.   It would be beneficial for you to stop tobacco use.  Please discuss with PCP or on your cardiology follow up visit if you need medication.    Call if any problems or questions.  Your magnesium and B12 were stopped due to extremely high dose.  Please check with your PCP on correct dose for you.    We added atrovent inhaler.  If pain returns use inhalers to see if improved.  You could use inhalers before the walk to see if this prevents pain.     - - - - - - - - - - - - - - - - - -  - - - - - -  - - - -  - - Information about your medication: Plavix (anti-platelet agent)  Generic Name (Brand): clopidogrel (Plavix), once daily medication  PURPOSE: You are taking this medication along with aspirin to lower your chance of having a heart attack, stroke, or blood clots in your heart stent. These can be fatal. Brilinta and aspirin help prevent platelets from sticking together and forming a clot that can block an artery or your stent.   Common SIDE EFFECTS you may experience include: bruising or bleeding more easily, shortness of breath  Do not stop taking PLAVIX without talking to the doctor who prescribes it for you. People who are treated with a stent and stop taking Plavix too soon, have a higher risk of getting a blood clot in the stent, having a heart attack, or dying. If you stop Plavix because of bleeding, or for other reasons, your risk of a heart attack or stroke may increase.   Tell all of your doctors and dentists that you are taking Plavix. They should talk to the doctor who prescribed Brilinta for you before you have any surgery or invasive procedure.   Contact your health care provider if you experience: severe or uncontrollable bleeding,  pink/red/brown urine, vomiting blood or vomit that looks like "coffee grounds", red or black stools (looks like tar), coughing up blood or blood clots --------------------------------------------------------------------------------------------

## 2018-03-22 ENCOUNTER — Telehealth: Payer: Self-pay | Admitting: Cardiovascular Disease

## 2018-03-22 NOTE — Telephone Encounter (Signed)
Left message for patient that there is no documentation of anyone calling and I did not. Instructed him to call tomorrow if he needs anything - otherwise, whomever called him will call again.

## 2018-03-22 NOTE — Telephone Encounter (Signed)
New Message:   Patient is return a call

## 2018-03-27 ENCOUNTER — Other Ambulatory Visit: Payer: Self-pay | Admitting: Cardiovascular Disease

## 2018-03-31 ENCOUNTER — Ambulatory Visit: Payer: Medicare HMO | Admitting: Cardiovascular Disease

## 2018-04-08 ENCOUNTER — Encounter: Payer: Self-pay | Admitting: Cardiology

## 2018-04-08 ENCOUNTER — Ambulatory Visit: Payer: Medicare HMO | Admitting: Cardiology

## 2018-04-08 VITALS — BP 122/82 | HR 64 | Ht 68.0 in | Wt 176.0 lb

## 2018-04-08 DIAGNOSIS — I25118 Atherosclerotic heart disease of native coronary artery with other forms of angina pectoris: Secondary | ICD-10-CM

## 2018-04-08 DIAGNOSIS — E785 Hyperlipidemia, unspecified: Secondary | ICD-10-CM

## 2018-04-08 DIAGNOSIS — Z720 Tobacco use: Secondary | ICD-10-CM

## 2018-04-08 MED ORDER — ISOSORBIDE MONONITRATE ER 30 MG PO TB24: 30.0000 mg | ORAL_TABLET | Freq: Every day | ORAL | 3 refills | Status: DC

## 2018-04-08 NOTE — Progress Notes (Signed)
Cardiology Office Note:    Date:  04/08/2018   ID:  Derrick Stokes, DOB 02-May-1951, MRN [id number]  PCP:  [name], [name], MD  Cardiologist:  Sherren Mocha, MD  Referring MD: [name], [name], MD   Chief Complaint  Patient presents with  . Hospitalization Follow-up    Post cath    History of Present Illness:    Derrick Stokes is a 67 y.o. male with a past medical history significant for coronary artery disease s/p CABG, hyperlipidemia, tobacco abuse.  He was admitted with unstable angina in 2016.  Cardiac Catheterization demonstrated an occluded S-OM and S-RCA. L-LAD, S-Dx grafts were patent. There was high grade disease in the mid LCx leading into OM2 and high grade disease in the mid RCA. He underwent PCI with a DES to the LCx leading into the OM2. He was brought back for staged intervention 2 days later with orbital atherectomy and DES to the RCA.  He was seen by Richardson Dopp, PA on 03/16/18 for c/o chest pain. He was admitted from the office to [facility] for progressively worsening angina. Troponins were negative and BNP normal. He underwent cardiac cath on 03/17/18 that showed widely patent SVG to Diagonal and LIMA to LAD were widely patent. Stent to the RCA was also patent. The stent to the circumflex is widely patent with a small area of in-stent restenosis of at most 40 to 50%. FFR of this area was only 0.88 suggesting that it was not physiologically significant. His LV function was normal. There is nothing in his coronary anatomy to suggest that he has coronary insufficiency attributed to his chest pain.   He is here today for follow up with his wife. He is no longer having the chest pain that he presented to the hospital with that was sharp and intense with numbness in the left arm that was like prior to his bypass and scared him. His pain resolved instantly with nitroglycerin in the ED. He carries NTG but has never used it. He does have an occ mild left chest heaviness. He  is very active doing chores around the house and yard and feels that he has to slow down due to the chest heaviness. He denies shortness of breath, orthopnea or PND.   He continues to smoke but is trying to cut down. He does not wish to try any medications to help with cessation. He says he quit twice before by just putting them down. He says he just needs to make the decision to quit, which he has not done at this time.   Past Medical History:  Diagnosis Date  . Arthritis   . B12 deficiency    Borderline  . CAD (coronary artery disease)    a. s/p CABG 01/2011;  b. ETT Myoview 3/14:  Low risk;  c. 09/2014 Cath/PCI: LM nl, LAD 100p, LCX 67m/OM2 60-70p (2.75x28 Synergy DES), RCA dom, 65m (atherectomy, 3.5x24 Synergy DES), PDA nl, LIMA->LAD nl, VG->Diag nl, VG->OM 100, VG->RCA 100.  [name] Hiatal hernia   . History of echocardiogram    Echo 10/16: EF 50-55%, no RWMA, Gr 1 DD, mild MR, mild TR  . HLD (hyperlipidemia)   . Kidney stones   . Leg pain    ABIs 4/14:  R 1.2, L 1.2, TBIs normal  . Peripheral neuropathy    From trauma  . Reactive airway disease 03/18/2018  . Tobacco abuse     Past Surgical History:  Procedure Laterality Date  .  COLONOSCOPY     Neg  . CORONARY ARTERY BYPASS GRAFT    . EPIDURAL STEROIDS    . INTRAVASCULAR PRESSURE WIRE/FFR STUDY N/A 03/17/2018   Procedure: INTRAVASCULAR PRESSURE WIRE/FFR STUDY;  Surgeon: Lorretta Harp, MD;  Location: [place] [facility];  Service: Cardiovascular;  Laterality: N/A;  . LEFT HEART CATH AND CORONARY ANGIOGRAPHY N/A 03/17/2018   Procedure: LEFT HEART CATH AND CORONARY ANGIOGRAPHY;  Surgeon: Lorretta Harp, MD;  Location: [place] [facility];  Service: Cardiovascular;  Laterality: N/A;  . LEFT HEART CATHETERIZATION WITH CORONARY/GRAFT ANGIOGRAM N/A 09/18/2014   Procedure: LEFT HEART CATHETERIZATION WITH [name];  Surgeon: [name], MD;  Location: [facility] CATH LAB;  Service: Cardiovascular;  Laterality: N/A;  .  PERCUTANEOUS CORONARY ROTOBLATOR INTERVENTION (PCI-R) N/A 09/20/2014   Procedure: PERCUTANEOUS CORONARY ROTOBLATOR INTERVENTION (PCI-R);  Surgeon: [name], MD;  Location: [facility] CATH LAB;  Service: Cardiovascular;  Laterality: N/A;    Current Medications: Current Meds  Medication Sig  . albuterol (PROVENTIL HFA;VENTOLIN HFA) 108 (90 Base) MCG/ACT inhaler Inhale 2 puffs into the lungs every 6 (six) hours as needed for wheezing or shortness of breath.  [name] aspirin EC 81 MG tablet Take 81 mg by mouth at bedtime.   [name] atorvastatin (LIPITOR) 80 MG tablet TAKE 1 TABLET (80 MG TOTAL) BY MOUTH DAILY AT 6 PM.  . clopidogrel (PLAVIX) 75 MG tablet TAKE 1 TABLET BY MOUTH EVERY DAY WITH BREAKFAST  . Liniments (SALONSIP AQUA-PATCH EX) Apply 1 patch topically as needed (back pain).  . metoprolol tartrate (LOPRESSOR) 25 MG tablet TAKE 1/2 TABLET BY MOUTH TWICE A DAY  . nitroGLYCERIN (NITROSTAT) 0.4 MG SL tablet Place 1 tablet (0.4 mg total) under the tongue every 5 (five) minutes x 3 doses as needed for chest pain.  . Omega-3 Fatty Acids (FISH OIL) 1000 MG CPDR Take 1 capsule by mouth every morning.  . pantoprazole (PROTONIX) 40 MG tablet TAKE 1 TABLET BY MOUTH EVERY DAY     Allergies:   Iodine; Iohexol; and Shrimp [shellfish allergy]   Social History   Socioeconomic History  . Marital status: Married    Spouse name: Not on file  . Number of children: Not on file  . Years of education: Not on file  . Highest education level: Not on file  Occupational History  . Occupation: Diasability  Social Needs  . Financial resource strain: Not on file  . Food insecurity:    Worry: Not on file    Inability: Not on file  . Transportation needs:    Medical: Not on file    Non-medical: Not on file  Tobacco Use  . Smoking status: Current Every Day Smoker    Packs/day: 1.00    Years: 45.00    Pack years: 45.00    Types: Cigarettes    Last attempt to quit: 10/01/2014    Years since quitting: 3.5  .  Smokeless tobacco: Former Network engineer and Sexual Activity  . Alcohol use: Yes    Alcohol/week: 4.0 standard drinks    Types: 4 Cans of beer per week    Comment: occ  . Drug use: No  . Sexual activity: Yes  Lifestyle  . Physical activity:    Days per week: Not on file    Minutes per session: Not on file  . Stress: Not on file  Relationships  . Social connections:    Talks on phone: Not on file    Gets together: Not on  file    Attends religious service: Not on file    Active member of club or organization: Not on file    Attends meetings of clubs or organizations: Not on file    Relationship status: Not on file  Other Topics Concern  . Not on file  Social History Narrative   Married   Gets regular exercise - walks 1/4 mile per day 5 times a week without symptoms     Family History: The patient's family history includes Cancer in his mother; Diabetes in his father; Heart attack (age of onset: 41) in his father; Heart disease in his father. ROS:   Please see the history of present illness.     All other systems reviewed and are negative.  EKGs/Labs/Other Studies Reviewed:    The following studies were reviewed today:  03/17/18 cardiac cath  Previously placed Prox RCA to Mid RCA stent (unknown type) is widely patent.  Ost LAD lesion is 100% stenosed.  Origin to Prox Graft lesion is 100% stenosed.  Origin lesion is 100% stenosed.  Ost Cx to Mid Cx lesion is 50% stenosed.  The left ventricular systolic function is normal.  LV end diastolic pressure is normal.  The left ventricular ejection fraction is 55-65% by visual estimate.  no culprit lesion. His vein graft to diagonal branch and LIMA to the LAD were widely patent. Previously demonstrated occlusion of the RCA and obtuse marginal branch vein grafts. The stent to the RCA is widely patent. The stent to the circumflex is widely patent with a small area of in-stent restenosis of at most 40 to 50%. FFR of  this area was only 0.88 suggesting that it was not physiologically significant. His LV function was normal. There is nothing in his coronary anatomy to suggest that he has coronary insufficiency attributed to his chest pain.    Diagnostic Diagram        EKG:  EKG is not ordered today.    Recent Labs: 03/16/2018: ALT 35 03/18/2018: B Natriuretic Peptide 67.0; BUN 16; Creatinine, Ser 0.84; Hemoglobin 14.0; Platelets 202; Potassium 4.0; Sodium 136   Recent Lipid Panel    Component Value Date/Time   CHOL 128 03/17/2018 0003   CHOL 173 01/22/2017 0727   TRIG 94 03/17/2018 0003   HDL 42 03/17/2018 0003   HDL 45 01/22/2017 0727   CHOLHDL 3.0 03/17/2018 0003   VLDL 19 03/17/2018 0003   LDLCALC 67 03/17/2018 0003   LDLCALC 97 01/22/2017 0727   LDLDIRECT 68.0 11/19/2014 0737    Physical Exam:    VS:  BP 122/82   Pulse 64   Ht 5\' 8"  (1.727 m)   Wt 176 lb (79.8 kg)   BMI 26.76 kg/m     Wt Readings from Last 3 Encounters:  04/08/18 176 lb (79.8 kg)  03/18/18 172 lb 12.8 oz (78.4 kg)  03/16/18 175 lb 8 oz (79.6 kg)     Physical Exam  Constitutional: He is oriented to person, place, and time. He appears well-developed and well-nourished.  HENT:  Head: Normocephalic and atraumatic.  Missing many teeth  Neck: Normal range of motion. Neck supple. No JVD present.  Cardiovascular: Normal rate, regular rhythm and intact distal pulses. Exam reveals no gallop and no friction rub.  No murmur heard. Pulmonary/Chest: Effort normal and breath sounds normal. No respiratory distress. He has no wheezes. He has no rales.  Abdominal: Soft. Bowel sounds are normal.  Musculoskeletal: Normal range of motion. He exhibits no edema.  Neurological: He is alert and oriented to person, place, and time.  Skin: Skin is warm and dry.  Psychiatric: He has a normal mood and affect. His behavior is normal. Judgment and thought content normal.  Vitals reviewed. Right groin cath site healed.    ASSESSMENT:    1. Coronary artery disease involving native coronary artery of native heart with other form of angina pectoris ([place])   2. Hyperlipidemia, unspecified hyperlipidemia type   3. Tobacco abuse    PLAN:    In order of problems listed above:  CAD: Recent eval for chest pain with cath showing patent grafts and stents. No culprit lesion found to explain his chest pain. He has not had any of the intense chest pain as prior to his cath but has intermittent left chest tightness that causes him to have to stop what he is doing or slow down. Will Add Imdur 30 mg daily in case he has some angina due to micro vessel disease. Pt instructed on proper use of SL NTG.  Reviewed heart healthy diet and exercise.   Hyperlipidemia: He continues on atorvastatin 80 mg. Lipids well controlled with LDL 67 at goal of <70.   Tobacco abuse: He continues to smoke and we discussed the negative effects of smoking on the blood vessels and increased risk for heart attack and stroke. He says that he quit twice before and thinks he can quit again he says if he can get his mind right.     Medication Adjustments/Labs and Tests Ordered: Current medicines are reviewed at length with the patient today.  Concerns regarding medicines are outlined above. Labs and tests ordered and medication changes are outlined in the patient instructions below:  Patient Instructions  Medication Instructions:  Your physician has recommended you make the following change in your medication:   1. START IMDUR 30 MG DAILY. RX HAS BEEN SENT IN   Labwork: None ordered  Testing/Procedures: None ordered  Follow-Up: Your physician wants you to follow-up in: 3 MONTHS with DR. Burt Knack.   Any Other Special Instructions Will Be Listed Below (If Applicable).     If you need a refill on your cardiac medications before your next appointment, please call your pharmacy.    DASH Eating Plan DASH stands for "Dietary Approaches to  Stop Hypertension." The DASH eating plan is a healthy eating plan that has been shown to reduce high blood pressure (hypertension). It may also reduce your risk for type 2 diabetes, heart disease, and stroke. The DASH eating plan may also help with weight loss. What are tips for following this plan? General guidelines  Avoid eating more than 2,300 mg (milligrams) of salt (sodium) a day. If you have hypertension, you may need to reduce your sodium intake to 1,500 mg a day.  Limit alcohol intake to no more than 1 drink a day for nonpregnant women and 2 drinks a day for men. One drink equals 12 oz of beer, 5 oz of wine, or 1 oz of hard liquor.  Work with your health care provider to maintain a healthy body weight or to lose weight. Ask what an ideal weight is for you.  Get at least 30 minutes of exercise that causes your heart to beat faster (aerobic exercise) most days of the week. Activities may include walking, swimming, or biking.  Work with your health care provider or diet and nutrition specialist (dietitian) to adjust your eating plan to your individual calorie needs. Reading food labels  Check food  labels for the amount of sodium per serving. Choose foods with less than 5 percent of the Daily Value of sodium. Generally, foods with less than 300 mg of sodium per serving fit into this eating plan.  To find whole grains, look for the word "whole" as the first word in the ingredient list. Shopping  Buy products labeled as "low-sodium" or "no salt added."  Buy fresh foods. Avoid canned foods and premade or frozen meals. Cooking  Avoid adding salt when cooking. Use salt-free seasonings or herbs instead of table salt or sea salt. Check with your health care provider or pharmacist before using salt substitutes.  Do not fry foods. Cook foods using healthy methods such as baking, boiling, grilling, and broiling instead.  Cook with heart-healthy oils, such as olive, canola, soybean, or  sunflower oil. Meal planning   Eat a balanced diet that includes: ? 5 or more servings of fruits and vegetables each day. At each meal, try to fill half of your plate with fruits and vegetables. ? Up to 6-8 servings of whole grains each day. ? Less than 6 oz of lean meat, poultry, or fish each day. A 3-oz serving of meat is about the same size as a deck of cards. One egg equals 1 oz. ? 2 servings of low-fat dairy each day. ? A serving of nuts, seeds, or beans 5 times each week. ? Heart-healthy fats. Healthy fats called Omega-3 fatty acids are found in foods such as flaxseeds and coldwater fish, like sardines, salmon, and mackerel.  Limit how much you eat of the following: ? Canned or prepackaged foods. ? Food that is high in trans fat, such as fried foods. ? Food that is high in saturated fat, such as fatty meat. ? Sweets, desserts, sugary drinks, and other foods with added sugar. ? Full-fat dairy products.  Do not salt foods before eating.  Try to eat at least 2 vegetarian meals each week.  Eat more home-cooked food and less restaurant, buffet, and fast food.  When eating at a restaurant, ask that your food be prepared with less salt or no salt, if possible. What foods are recommended? The items listed may not be a complete list. Talk with your dietitian about what dietary choices are best for you. Grains Whole-grain or whole-wheat bread. Whole-grain or whole-wheat pasta. Brown rice. [place] Morrow. Bulgur. Whole-grain and low-sodium cereals. Pita bread. Low-fat, low-sodium crackers. Whole-wheat flour tortillas. Vegetables Fresh or frozen vegetables (raw, steamed, roasted, or grilled). Low-sodium or reduced-sodium tomato and vegetable juice. Low-sodium or reduced-sodium tomato sauce and tomato paste. Low-sodium or reduced-sodium canned vegetables. Fruits All fresh, dried, or frozen fruit. Canned fruit in natural juice (without added sugar). Meat and other protein foods Skinless  chicken or [place]. Ground chicken or [place]. Pork with fat trimmed off. Fish and seafood. Egg whites. Dried beans, peas, or lentils. Unsalted nuts, nut butters, and seeds. Unsalted canned beans. Lean cuts of beef with fat trimmed off. Low-sodium, lean deli meat. Dairy Low-fat (1%) or fat-free (skim) milk. Fat-free, low-fat, or reduced-fat cheeses. Nonfat, low-sodium ricotta or cottage cheese. Low-fat or nonfat yogurt. Low-fat, low-sodium cheese. Fats and oils Soft margarine without trans fats. Vegetable oil. Low-fat, reduced-fat, or light mayonnaise and salad dressings (reduced-sodium). Canola, safflower, olive, soybean, and sunflower oils. Avocado. Seasoning and other foods Herbs. Spices. Seasoning mixes without salt. Unsalted popcorn and pretzels. Fat-free sweets. What foods are not recommended? The items listed may not be a complete list. Talk with your dietitian about what  dietary choices are best for you. Grains Baked goods made with fat, such as croissants, muffins, or some breads. Dry pasta or rice meal packs. Vegetables Creamed or fried vegetables. Vegetables in a cheese sauce. Regular canned vegetables (not low-sodium or reduced-sodium). Regular canned tomato sauce and paste (not low-sodium or reduced-sodium). Regular tomato and vegetable juice (not low-sodium or reduced-sodium). Angie Fava. Olives. Fruits Canned fruit in a light or heavy syrup. Fried fruit. Fruit in cream or butter sauce. Meat and other protein foods Fatty cuts of meat. Ribs. Fried meat. Berniece Salines. Sausage. Bologna and other processed lunch meats. Salami. Fatback. Hotdogs. Bratwurst. Salted nuts and seeds. Canned beans with added salt. Canned or smoked fish. Whole eggs or egg yolks. Chicken or [place] with skin. Dairy Whole or 2% milk, cream, and half-and-half. Whole or full-fat cream cheese. Whole-fat or sweetened yogurt. Full-fat cheese. Nondairy creamers. Whipped toppings. Processed cheese and cheese spreads. Fats and  oils Butter. Stick margarine. Lard. Shortening. Ghee. Bacon fat. Tropical oils, such as coconut, palm kernel, or palm oil. Seasoning and other foods Salted popcorn and pretzels. Onion salt, garlic salt, seasoned salt, table salt, and sea salt. Worcestershire sauce. Tartar sauce. Barbecue sauce. Teriyaki sauce. Soy sauce, including reduced-sodium. Steak sauce. Canned and packaged gravies. Fish sauce. Oyster sauce. Cocktail sauce. Horseradish that you find on the shelf. Ketchup. Mustard. Meat flavorings and tenderizers. Bouillon cubes. Hot sauce and Tabasco sauce. Premade or packaged marinades. Premade or packaged taco seasonings. Relishes. Regular salad dressings. Where to find more information:  National Heart, Lung, and [place]: https://wilson-eaton.com/  American Heart Association: www.heart.org Summary  The DASH eating plan is a healthy eating plan that has been shown to reduce high blood pressure (hypertension). It may also reduce your risk for type 2 diabetes, heart disease, and stroke.  With the DASH eating plan, you should limit salt (sodium) intake to 2,300 mg a day. If you have hypertension, you may need to reduce your sodium intake to 1,500 mg a day.  When on the DASH eating plan, aim to eat more fresh fruits and vegetables, whole grains, lean proteins, low-fat dairy, and heart-healthy fats.  Work with your health care provider or diet and nutrition specialist (dietitian) to adjust your eating plan to your individual calorie needs. This information is not intended to replace advice given to you by your health care provider. Make sure you discuss any questions you have with your health care provider. Document Released: 06/25/2011 Document Revised: 06/29/2016 Document Reviewed: 06/29/2016 Elsevier Interactive Patient Education  2018 [place], Daune Perch, NP  04/09/2018 12:34 PM    [place]

## 2018-04-08 NOTE — Patient Instructions (Signed)
Medication Instructions:  Your physician has recommended you make the following change in your medication:   1. START IMDUR 30 MG DAILY. RX HAS BEEN SENT IN   Labwork: None ordered  Testing/Procedures: None ordered  Follow-Up: Your physician wants you to follow-up in: 3 MONTHS with DR. Burt Knack.   Any Other Special Instructions Will Be Listed Below (If Applicable).     If you need a refill on your cardiac medications before your next appointment, please call your pharmacy.    DASH Eating Plan DASH stands for "Dietary Approaches to Stop Hypertension." The DASH eating plan is a healthy eating plan that has been shown to reduce high blood pressure (hypertension). It may also reduce your risk for type 2 diabetes, heart disease, and stroke. The DASH eating plan may also help with weight loss. What are tips for following this plan? General guidelines  Avoid eating more than 2,300 mg (milligrams) of salt (sodium) a day. If you have hypertension, you may need to reduce your sodium intake to 1,500 mg a day.  Limit alcohol intake to no more than 1 drink a day for nonpregnant women and 2 drinks a day for men. One drink equals 12 oz of beer, 5 oz of wine, or 1 oz of hard liquor.  Work with your health care provider to maintain a healthy body weight or to lose weight. Ask what an ideal weight is for you.  Get at least 30 minutes of exercise that causes your heart to beat faster (aerobic exercise) most days of the week. Activities may include walking, swimming, or biking.  Work with your health care provider or diet and nutrition specialist (dietitian) to adjust your eating plan to your individual calorie needs. Reading food labels  Check food labels for the amount of sodium per serving. Choose foods with less than 5 percent of the Daily Value of sodium. Generally, foods with less than 300 mg of sodium per serving fit into this eating plan.  To find whole grains, look for the word "whole"  as the first word in the ingredient list. Shopping  Buy products labeled as "low-sodium" or "no salt added."  Buy fresh foods. Avoid canned foods and premade or frozen meals. Cooking  Avoid adding salt when cooking. Use salt-free seasonings or herbs instead of table salt or sea salt. Check with your health care provider or pharmacist before using salt substitutes.  Do not fry foods. Cook foods using healthy methods such as baking, boiling, grilling, and broiling instead.  Cook with heart-healthy oils, such as olive, canola, soybean, or sunflower oil. Meal planning   Eat a balanced diet that includes: ? 5 or more servings of fruits and vegetables each day. At each meal, try to fill half of your plate with fruits and vegetables. ? Up to 6-8 servings of whole grains each day. ? Less than 6 oz of lean meat, poultry, or fish each day. A 3-oz serving of meat is about the same size as a deck of cards. One egg equals 1 oz. ? 2 servings of low-fat dairy each day. ? A serving of nuts, seeds, or beans 5 times each week. ? Heart-healthy fats. Healthy fats called Omega-3 fatty acids are found in foods such as flaxseeds and coldwater fish, like sardines, salmon, and mackerel.  Limit how much you eat of the following: ? Canned or prepackaged foods. ? Food that is high in trans fat, such as fried foods. ? Food that is high in saturated fat, such  as fatty meat. ? Sweets, desserts, sugary drinks, and other foods with added sugar. ? Full-fat dairy products.  Do not salt foods before eating.  Try to eat at least 2 vegetarian meals each week.  Eat more home-cooked food and less restaurant, buffet, and fast food.  When eating at a restaurant, ask that your food be prepared with less salt or no salt, if possible. What foods are recommended? The items listed may not be a complete list. Talk with your dietitian about what dietary choices are best for you. Grains Whole-grain or whole-wheat bread.  Whole-grain or whole-wheat pasta. Brown rice. [place]. Bulgur. Whole-grain and low-sodium cereals. Pita bread. Low-fat, low-sodium crackers. Whole-wheat flour tortillas. Vegetables Fresh or frozen vegetables (raw, steamed, roasted, or grilled). Low-sodium or reduced-sodium tomato and vegetable juice. Low-sodium or reduced-sodium tomato sauce and tomato paste. Low-sodium or reduced-sodium canned vegetables. Fruits All fresh, dried, or frozen fruit. Canned fruit in natural juice (without added sugar). Meat and other protein foods Skinless chicken or [place]. Ground chicken or [place]. Pork with fat trimmed off. Fish and seafood. Egg whites. Dried beans, peas, or lentils. Unsalted nuts, nut butters, and seeds. Unsalted canned beans. Lean cuts of beef with fat trimmed off. Low-sodium, lean deli meat. Dairy Low-fat (1%) or fat-free (skim) milk. Fat-free, low-fat, or reduced-fat cheeses. Nonfat, low-sodium ricotta or cottage cheese. Low-fat or nonfat yogurt. Low-fat, low-sodium cheese. Fats and oils Soft margarine without trans fats. Vegetable oil. Low-fat, reduced-fat, or light mayonnaise and salad dressings (reduced-sodium). Canola, safflower, olive, soybean, and sunflower oils. Avocado. Seasoning and other foods Herbs. Spices. Seasoning mixes without salt. Unsalted popcorn and pretzels. Fat-free sweets. What foods are not recommended? The items listed may not be a complete list. Talk with your dietitian about what dietary choices are best for you. Grains Baked goods made with fat, such as croissants, muffins, or some breads. Dry pasta or rice meal packs. Vegetables Creamed or fried vegetables. Vegetables in a cheese sauce. Regular canned vegetables (not low-sodium or reduced-sodium). Regular canned tomato sauce and paste (not low-sodium or reduced-sodium). Regular tomato and vegetable juice (not low-sodium or reduced-sodium). Angie Fava. Olives. Fruits Canned fruit in a light or heavy syrup.  Fried fruit. Fruit in cream or butter sauce. Meat and other protein foods Fatty cuts of meat. Ribs. Fried meat. Berniece Salines. Sausage. Bologna and other processed lunch meats. Salami. Fatback. Hotdogs. Bratwurst. Salted nuts and seeds. Canned beans with added salt. Canned or smoked fish. Whole eggs or egg yolks. Chicken or [place] with skin. Dairy Whole or 2% milk, cream, and half-and-half. Whole or full-fat cream cheese. Whole-fat or sweetened yogurt. Full-fat cheese. Nondairy creamers. Whipped toppings. Processed cheese and cheese spreads. Fats and oils Butter. Stick margarine. Lard. Shortening. Ghee. Bacon fat. Tropical oils, such as coconut, palm kernel, or palm oil. Seasoning and other foods Salted popcorn and pretzels. Onion salt, garlic salt, seasoned salt, table salt, and sea salt. Worcestershire sauce. Tartar sauce. Barbecue sauce. Teriyaki sauce. Soy sauce, including reduced-sodium. Steak sauce. Canned and packaged gravies. Fish sauce. Oyster sauce. Cocktail sauce. Horseradish that you find on the shelf. Ketchup. Mustard. Meat flavorings and tenderizers. Bouillon cubes. Hot sauce and Tabasco sauce. Premade or packaged marinades. Premade or packaged taco seasonings. Relishes. Regular salad dressings. Where to find more information:  National Heart, Lung, and [place]: https://wilson-eaton.com/  American Heart Association: www.heart.org Summary  The DASH eating plan is a healthy eating plan that has been shown to reduce high blood pressure (hypertension). It may also reduce your risk  for type 2 diabetes, heart disease, and stroke.  With the DASH eating plan, you should limit salt (sodium) intake to 2,300 mg a day. If you have hypertension, you may need to reduce your sodium intake to 1,500 mg a day.  When on the DASH eating plan, aim to eat more fresh fruits and vegetables, whole grains, lean proteins, low-fat dairy, and heart-healthy fats.  Work with your health care provider or diet and  nutrition specialist (dietitian) to adjust your eating plan to your individual calorie needs. This information is not intended to replace advice given to you by your health care provider. Make sure you discuss any questions you have with your health care provider. Document Released: 06/25/2011 Document Revised: 06/29/2016 Document Reviewed: 06/29/2016 Elsevier Interactive Patient Education  [employer].

## 2018-04-12 ENCOUNTER — Other Ambulatory Visit: Payer: Self-pay | Admitting: Cardiovascular Disease

## 2018-04-12 DIAGNOSIS — E785 Hyperlipidemia, unspecified: Secondary | ICD-10-CM

## 2018-05-18 ENCOUNTER — Other Ambulatory Visit: Payer: Self-pay | Admitting: Cardiovascular Disease

## 2018-07-31 ENCOUNTER — Encounter: Payer: Self-pay | Admitting: Family Medicine

## 2018-08-01 DIAGNOSIS — Z961 Presence of intraocular lens: Secondary | ICD-10-CM | POA: Diagnosis not present

## 2018-08-01 DIAGNOSIS — H40013 Open angle with borderline findings, low risk, bilateral: Secondary | ICD-10-CM | POA: Diagnosis not present

## 2018-08-04 ENCOUNTER — Ambulatory Visit: Payer: Medicare HMO | Admitting: Cardiovascular Disease

## 2018-08-04 ENCOUNTER — Encounter: Payer: Self-pay | Admitting: Cardiovascular Disease

## 2018-08-04 VITALS — BP 132/86 | HR 56 | Ht 68.0 in | Wt 176.2 lb

## 2018-08-04 DIAGNOSIS — I25118 Atherosclerotic heart disease of native coronary artery with other forms of angina pectoris: Secondary | ICD-10-CM | POA: Diagnosis not present

## 2018-08-04 DIAGNOSIS — M79604 Pain in right leg: Secondary | ICD-10-CM

## 2018-08-04 DIAGNOSIS — E782 Mixed hyperlipidemia: Secondary | ICD-10-CM | POA: Diagnosis not present

## 2018-08-04 DIAGNOSIS — M79605 Pain in left leg: Secondary | ICD-10-CM

## 2018-08-04 DIAGNOSIS — Z720 Tobacco use: Secondary | ICD-10-CM

## 2018-08-04 NOTE — Patient Instructions (Addendum)
Medication Instructions:  1) HOLD PLAVIX 5 days prior to your dental extraction. We'd like you to stay on your Aspirin for the procedure.  Labwork: None  Testing/Procedures: Your physician has requested that you have an ankle brachial index (ABI). During this test an ultrasound and blood pressure cuff are used to evaluate the arteries that supply the arms and legs with blood. Allow thirty minutes for this exam. There are no restrictions or special instructions.  Follow-Up: Your provider wants you to follow-up in: 6 months with Dr. Antionette Char assistant. You will receive a reminder letter in the mail two months in advance. If you don't receive a letter, please call our office to schedule the follow-up appointment.

## 2018-08-04 NOTE — Progress Notes (Signed)
Cardiology Office Note:    Date:  08/04/2018   ID:  Derrick Stokes, DOB 11/24/1950, MRN [id number]  PCP:  [name], [name], MD  Cardiologist:  Sherren Mocha, MD  Electrophysiologist:  None   Referring MD: [name], [name], MD   Chief Complaint  Patient presents with  . Chest Pain   History of Present Illness:    Derrick Stokes is a 68 y.o. male with a hx of CAD status post CABG, hyperlipidemia, and tobacco abuse.  He has a history of coronary bypass surgery.  He is undergone PCI in 2016 with stenting of the circumflex followed by staged PCI of the right coronary artery with atherectomy and stenting.  He had recurrent chest pain in August 2019 and underwent repeat cardiac catheterization demonstrating stable coronary anatomy with patent stents.  Medical therapy was recommended.  He has occasional chest pain, but no symptoms with exertion. He is able to cut wood and walk without exertional angina. His primary limitation relates to leg pain and 'cramping' in the thighs. He's been limited for a long time - reports extensive testing and diagnosis of neuropathy. No shortness of breath or other complaints. He was intolerant to imdur and wasn't able to tolerate because of headaches.  He also informs me that he is going to have all of his teeth pulled in the near future and he will need clearance and instructions for this.  Past Medical History:  Diagnosis Date  . Arthritis   . B12 deficiency    Borderline  . CAD (coronary artery disease)    a. s/p CABG 01/2011;  b. ETT Myoview 3/14:  Low risk;  c. 09/2014 Cath/PCI: LM nl, LAD 100p, LCX 10m/OM2 60-70p (2.75x28 Synergy DES), RCA dom, 23m (atherectomy, 3.5x24 Synergy DES), PDA nl, LIMA->LAD nl, VG->Diag nl, VG->OM 100, VG->RCA 100.  [name] Hiatal hernia   . History of echocardiogram    Echo 10/16: EF 50-55%, no RWMA, Gr 1 DD, mild MR, mild TR  . HLD (hyperlipidemia)   . Kidney stones   . Leg pain    ABIs 4/14:  R 1.2, L 1.2, TBIs normal  .  Peripheral neuropathy    From trauma  . Reactive airway disease 03/18/2018  . Tobacco abuse     Past Surgical History:  Procedure Laterality Date  . COLONOSCOPY     Neg  . CORONARY ARTERY BYPASS GRAFT    . EPIDURAL STEROIDS    . INTRAVASCULAR PRESSURE WIRE/FFR STUDY N/A 03/17/2018   Procedure: INTRAVASCULAR PRESSURE WIRE/FFR STUDY;  Surgeon: Lorretta Harp, MD;  Location: [facility];  Service: Cardiovascular;  Laterality: N/A;  . LEFT HEART CATH AND CORONARY ANGIOGRAPHY N/A 03/17/2018   Procedure: LEFT HEART CATH AND CORONARY ANGIOGRAPHY;  Surgeon: Lorretta Harp, MD;  Location: [place] [facility];  Service: Cardiovascular;  Laterality: N/A;  . LEFT HEART CATHETERIZATION WITH CORONARY/GRAFT ANGIOGRAM N/A 09/18/2014   Procedure: LEFT HEART CATHETERIZATION WITH [name];  Surgeon: [name], MD;  Location: [facility];  Service: Cardiovascular;  Laterality: N/A;  . PERCUTANEOUS CORONARY ROTOBLATOR INTERVENTION (PCI-R) N/A 09/20/2014   Procedure: PERCUTANEOUS CORONARY ROTOBLATOR INTERVENTION (PCI-R);  Surgeon: [name], MD;  Location: [facility] CATH LAB;  Service: Cardiovascular;  Laterality: N/A;    Current Medications: Current Meds  Medication Sig  . aspirin EC 81 MG tablet Take 81 mg by mouth at bedtime.   [name] atorvastatin (LIPITOR) 80 MG tablet TAKE 1 TABLET (80 MG TOTAL) BY MOUTH  DAILY AT 6 PM.  . clopidogrel (PLAVIX) 75 MG tablet TAKE 1 TABLET BY MOUTH EVERY DAY WITH BREAKFAST  . isosorbide mononitrate (IMDUR) 30 MG 24 hr tablet Take 1 tablet (30 mg total) by mouth daily.  . Liniments (SALONSIP AQUA-PATCH EX) Apply 1 patch topically as needed (back pain).  . metoprolol tartrate (LOPRESSOR) 25 MG tablet TAKE 1/2 TABLET BY MOUTH TWICE A DAY  . nitroGLYCERIN (NITROSTAT) 0.4 MG SL tablet Place 1 tablet (0.4 mg total) under the tongue every 5 (five) minutes x 3 doses as needed for chest pain.  . Omega-3 Fatty Acids (FISH OIL) 1000 MG CPDR Take 1  capsule by mouth every morning.  . pantoprazole (PROTONIX) 40 MG tablet TAKE 1 TABLET BY MOUTH EVERY DAY  . vitamin B-12 (CYANOCOBALAMIN) 500 MCG tablet Take 500 mcg by mouth daily.     Allergies:   Iodine; Iohexol; and Shrimp [shellfish allergy]   Social History   Socioeconomic History  . Marital status: Married    Spouse name: Not on file  . Number of children: Not on file  . Years of education: Not on file  . Highest education level: Not on file  Occupational History  . Occupation: Diasability  Social Needs  . Financial resource strain: Not on file  . Food insecurity:    Worry: Not on file    Inability: Not on file  . Transportation needs:    Medical: Not on file    Non-medical: Not on file  Tobacco Use  . Smoking status: Current Every Day Smoker    Packs/day: 1.00    Years: 45.00    Pack years: 45.00    Types: Cigarettes    Last attempt to quit: 10/01/2014    Years since quitting: 3.8  . Smokeless tobacco: Former Network engineer and Sexual Activity  . Alcohol use: Yes    Alcohol/week: 4.0 standard drinks    Types: 4 Cans of beer per week    Comment: occ  . Drug use: No  . Sexual activity: Yes  Lifestyle  . Physical activity:    Days per week: Not on file    Minutes per session: Not on file  . Stress: Not on file  Relationships  . Social connections:    Talks on phone: Not on file    Gets together: Not on file    Attends religious service: Not on file    Active member of club or organization: Not on file    Attends meetings of clubs or organizations: Not on file    Relationship status: Not on file  Other Topics Concern  . Not on file  Social History Narrative   Married   Gets regular exercise - walks 1/4 mile per day 5 times a week without symptoms     Family History: The patient's family history includes Cancer in his mother; Diabetes in his father; Heart attack (age of onset: 63) in his father; Heart disease in his father.  ROS:   Please see the  history of present illness.    All other systems reviewed and are negative.  EKGs/Labs/Other Studies Reviewed:    EKG:  EKG is not ordered today.    Recent Labs: 03/16/2018: ALT 35 03/18/2018: B Natriuretic Peptide 67.0; BUN 16; Creatinine, Ser 0.84; Hemoglobin 14.0; Platelets 202; Potassium 4.0; Sodium 136  Recent Lipid Panel    Component Value Date/Time   CHOL 128 03/17/2018 0003   CHOL 173 01/22/2017 0727   TRIG 94 03/17/2018  0003   HDL 42 03/17/2018 0003   HDL 45 01/22/2017 0727   CHOLHDL 3.0 03/17/2018 0003   VLDL 19 03/17/2018 0003   LDLCALC 67 03/17/2018 0003   LDLCALC 97 01/22/2017 0727   LDLDIRECT 68.0 11/19/2014 0737    Physical Exam:    VS:  BP 132/86   Pulse (!) 56   Ht 5\' 8"  (1.727 m)   Wt 176 lb 3.2 oz (79.9 kg)   SpO2 97%   BMI 26.79 kg/m     Wt Readings from Last 3 Encounters:  08/04/18 176 lb 3.2 oz (79.9 kg)  04/08/18 176 lb (79.8 kg)  03/18/18 172 lb 12.8 oz (78.4 kg)     GEN: Well nourished, well developed in no acute distress HEENT: Normal except for poor dentition NECK: No JVD; No carotid bruits LYMPHATICS: No lymphadenopathy CARDIAC: RRR, no murmurs, rubs, gallops RESPIRATORY:  Clear to auscultation without rales, wheezing or rhonchi  ABDOMEN: Soft, non-tender, non-distended MUSCULOSKELETAL:  No edema; No deformity  SKIN: Warm and dry NEUROLOGIC:  Alert and oriented x 3 PSYCHIATRIC:  Normal affect   ASSESSMENT:    1. Coronary artery disease involving native coronary artery of native heart with other form of angina pectoris ([place])   2. Tobacco abuse   3. Mixed hyperlipidemia   4. Pain in both lower extremities    PLAN:    In order of problems listed above:  1. The patient is stable on his current medical program.  I did not make any changes today.  For his upcoming dental extraction, I think he is okay to proceed without further testing since he has no exertional anginal symptoms on current medicines and is able to achieve greater  than 4 metabolic equivalents without difficulty.  He would be at acceptable risk of holding clopidogrel for 5 days prior to dental extraction.  I would like him to stay on low-dose aspirin if possible. 2. Cessation counseling done.  He has quit twice in the past for about 6 months but has not been able to stay off of cigarettes.  He is currently smoking 1 pack/day. 3. Last lipids reviewed and at goal.  He continues on high intensity statin drug.  LFTs were within normal limits. 4. With his cardiovascular disease and ongoing tobacco abuse, I am going to check ABIs to evaluate for obstructive lower extremity arterial disease.   Medication Adjustments/Labs and Tests Ordered: Current medicines are reviewed at length with the patient today.  Concerns regarding medicines are outlined above.  No orders of the defined types were placed in this encounter.  No orders of the defined types were placed in this encounter.   Patient Instructions  Medication Instructions:  1) HOLD PLAVIX 5 days prior to your dental extraction. We'd like you to stay on your Aspirin for the procedure.  Labwork: None  Testing/Procedures: Your physician has requested that you have an ankle brachial index (ABI). During this test an ultrasound and blood pressure cuff are used to evaluate the arteries that supply the arms and legs with blood. Allow thirty minutes for this exam. There are no restrictions or special instructions.  Follow-Up: Your provider wants you to follow-up in: 6 months with Dr. Antionette Char assistant. You will receive a reminder letter in the mail two months in advance. If you don't receive a letter, please call our office to schedule the follow-up appointment.        Signed, Sherren Mocha, MD  08/04/2018 1:16 PM    [facility]

## 2018-08-05 ENCOUNTER — Encounter: Payer: Self-pay | Admitting: Cardiovascular Disease

## 2018-08-05 ENCOUNTER — Other Ambulatory Visit (HOSPITAL_COMMUNITY): Payer: Self-pay | Admitting: Cardiovascular Disease

## 2018-08-05 DIAGNOSIS — I739 Peripheral vascular disease, unspecified: Secondary | ICD-10-CM

## 2018-08-17 ENCOUNTER — Ambulatory Visit (HOSPITAL_COMMUNITY)
Admission: RE | Admit: 2018-08-17 | Discharge: 2018-08-17 | Disposition: A | Discharge status: Home / Self Care | Payer: Medicare HMO | Source: Ambulatory Visit | Attending: Cardiovascular Disease | Admitting: Cardiovascular Disease

## 2018-08-17 DIAGNOSIS — I739 Peripheral vascular disease, unspecified: Secondary | ICD-10-CM | POA: Diagnosis not present

## 2018-08-17 DIAGNOSIS — M79605 Pain in left leg: Secondary | ICD-10-CM | POA: Diagnosis not present

## 2018-08-17 DIAGNOSIS — M79604 Pain in right leg: Secondary | ICD-10-CM | POA: Insufficient documentation

## 2018-08-19 ENCOUNTER — Encounter

## 2018-09-14 ENCOUNTER — Ambulatory Visit (INDEPENDENT_AMBULATORY_CARE_PROVIDER_SITE_OTHER): Payer: Medicare HMO | Admitting: Family Medicine

## 2018-09-14 ENCOUNTER — Encounter: Payer: Self-pay | Admitting: Family Medicine

## 2018-09-14 VITALS — BP 120/78 | HR 68 | Temp 97.7°F | Resp 12 | Ht 68.0 in | Wt 177.4 lb

## 2018-09-14 DIAGNOSIS — E785 Hyperlipidemia, unspecified: Secondary | ICD-10-CM | POA: Diagnosis not present

## 2018-09-14 DIAGNOSIS — Z136 Encounter for screening for cardiovascular disorders: Secondary | ICD-10-CM | POA: Diagnosis not present

## 2018-09-14 DIAGNOSIS — Z720 Tobacco use: Secondary | ICD-10-CM | POA: Diagnosis not present

## 2018-09-14 DIAGNOSIS — Z23 Encounter for immunization: Secondary | ICD-10-CM

## 2018-09-14 DIAGNOSIS — Z125 Encounter for screening for malignant neoplasm of prostate: Secondary | ICD-10-CM

## 2018-09-14 DIAGNOSIS — Z122 Encounter for screening for malignant neoplasm of respiratory organs: Secondary | ICD-10-CM

## 2018-09-14 DIAGNOSIS — R739 Hyperglycemia, unspecified: Secondary | ICD-10-CM | POA: Diagnosis not present

## 2018-09-14 DIAGNOSIS — E538 Deficiency of other specified B group vitamins: Secondary | ICD-10-CM

## 2018-09-14 DIAGNOSIS — Z1159 Encounter for screening for other viral diseases: Secondary | ICD-10-CM

## 2018-09-14 DIAGNOSIS — Z9189 Other specified personal risk factors, not elsewhere classified: Secondary | ICD-10-CM | POA: Diagnosis not present

## 2018-09-14 DIAGNOSIS — Z0000 Encounter for general adult medical examination without abnormal findings: Secondary | ICD-10-CM | POA: Diagnosis not present

## 2018-09-14 LAB — HEMOGLOBIN A1C: Hgb A1c MFr Bld: 5.3 % (ref 4.6–6.5)

## 2018-09-14 NOTE — Progress Notes (Signed)
HPI:  Derrick Stokes is a 68 y.o.male here today with his wife for his routine physical examination.  Last CPE: Over 1-2 years ago. He lives with his wife.  Regular exercise 3 or more times per week: Not consistently. Following a healthy diet: No   Chronic medical problems: CAD,GERD,HLD,chronic pain,neuropathy,and asthma among some. He follows with cardiologist. He is on Plavix and Aspirin.   Immunization History  Administered Date(s) Administered  . Influenza, High Dose Seasonal PF 09/14/2018  . Influenza,inj,Quad PF,6+ Mos 04/17/2014, 05/06/2015, 06/08/2016  . Pneumococcal Conjugate-13 09/14/2018  . Pneumococcal Polysaccharide-23 06/08/2016  . Tdap 05/01/2014  . Tetanus 04/17/2014  . Zoster 05/01/2014   -Hep C screening: Never  Last colon cancer screening: Colonoscopy 02/2011. Last prostate ca screening: Not sure.  -Denies high alcohol intake  or Hx of illicit drug use. + Smoker for over 42 years,1 ppd in average.  -Concerns and/or follow up today:  'B12 deficiency: He is on oral OTC B12 supplementation, 500 mcg (?) daily.  Lab Results  Component Value Date   [id number] 403 09/20/2017   He has had elevated fasting glucose in the past, 115. Denies abdominal pain, nausea,vomiting, polydipsia,polyuria, or polyphagia.   Lab Results  Component Value Date   HGBA1C 5.5 06/08/2016    Lab Results  Component Value Date   CREATININE 0.84 03/18/2018   BUN 16 03/18/2018   NA 136 03/18/2018   K 4.0 03/18/2018   CL 107 03/18/2018   CO2 21 (L) 03/18/2018     Review of Systems  Constitutional: Negative for activity change, appetite change, fatigue and fever.  HENT: Negative for dental problem, nosebleeds, sore throat, trouble swallowing and voice change.   Eyes: Negative for redness and visual disturbance.  Respiratory: Positive for cough (Occasionally). Negative for shortness of breath and wheezing.   Cardiovascular: Negative for chest pain,  palpitations and leg swelling.  Gastrointestinal: Negative for abdominal pain, blood in stool, nausea and vomiting.  Endocrine: Negative for polydipsia, polyphagia and polyuria.  Genitourinary: Negative for decreased urine volume, dysuria, genital sores, hematuria and testicular pain.  Musculoskeletal: Negative for gait problem and myalgias.  Skin: Negative for color change and rash.  Neurological: Negative for syncope, weakness and headaches.  Hematological: Negative for adenopathy. Does not bruise/bleed easily.  Psychiatric/Behavioral: Negative for confusion and sleep disturbance. The patient is not nervous/anxious.   All other systems reviewed and are negative.   Current Outpatient Medications on File Prior to Visit  Medication Sig Dispense Refill  . aspirin EC 81 MG tablet Take 81 mg by mouth at bedtime.     [name] atorvastatin (LIPITOR) 80 MG tablet TAKE 1 TABLET (80 MG TOTAL) BY MOUTH DAILY AT 6 PM. 90 tablet 3  . clopidogrel (PLAVIX) 75 MG tablet TAKE 1 TABLET BY MOUTH EVERY DAY WITH BREAKFAST 90 tablet 2  . Liniments (SALONSIP AQUA-PATCH EX) Apply 1 patch topically as needed (back pain).    . metoprolol tartrate (LOPRESSOR) 25 MG tablet TAKE 1/2 TABLET BY MOUTH TWICE A DAY 90 tablet 2  . nitroGLYCERIN (NITROSTAT) 0.4 MG SL tablet Place 1 tablet (0.4 mg total) under the tongue every 5 (five) minutes x 3 doses as needed for chest pain. 25 tablet 3  . Omega-3 Fatty Acids (FISH OIL) 1000 MG CPDR Take 1 capsule by mouth every morning.    . pantoprazole (PROTONIX) 40 MG tablet TAKE 1 TABLET BY MOUTH EVERY DAY 90 tablet 3  . vitamin B-12 (CYANOCOBALAMIN) 500 MCG tablet  Take 500 mcg by mouth daily.     No current facility-administered medications on file prior to visit.      Past Medical History:  Diagnosis Date  . Arthritis   . B12 deficiency    Borderline  . CAD (coronary artery disease)    a. s/p CABG 01/2011;  b. ETT Myoview 3/14:  Low risk;  c. 09/2014 Cath/PCI: LM nl, LAD 100p, LCX  74m/OM2 60-70p (2.75x28 Synergy DES), RCA dom, 29m (atherectomy, 3.5x24 Synergy DES), PDA nl, LIMA->LAD nl, VG->Diag nl, VG->OM 100, VG->RCA 100.  [name] Hiatal hernia   . History of echocardiogram    Echo 10/16: EF 50-55%, no RWMA, Gr 1 DD, mild MR, mild TR  . HLD (hyperlipidemia)   . Kidney stones   . Leg pain    ABIs 4/14:  R 1.2, L 1.2, TBIs normal  . Peripheral neuropathy    From trauma  . Reactive airway disease 03/18/2018  . Tobacco abuse     Past Surgical History:  Procedure Laterality Date  . COLONOSCOPY     Neg  . CORONARY ARTERY BYPASS GRAFT    . EPIDURAL STEROIDS    . INTRAVASCULAR PRESSURE WIRE/FFR STUDY N/A 03/17/2018   Procedure: INTRAVASCULAR PRESSURE WIRE/FFR STUDY;  Surgeon: Lorretta Harp, MD;  Location: [facility];  Service: Cardiovascular;  Laterality: N/A;  . LEFT HEART CATH AND CORONARY ANGIOGRAPHY N/A 03/17/2018   Procedure: LEFT HEART CATH AND CORONARY ANGIOGRAPHY;  Surgeon: Lorretta Harp, MD;  Location: [facility];  Service: Cardiovascular;  Laterality: N/A;  . LEFT HEART CATHETERIZATION WITH CORONARY/GRAFT ANGIOGRAM N/A 09/18/2014   Procedure: LEFT HEART CATHETERIZATION WITH [name];  Surgeon: [name], MD;  Location: [facility] CATH [facility];  Service: Cardiovascular;  Laterality: N/A;  . PERCUTANEOUS CORONARY ROTOBLATOR INTERVENTION (PCI-R) N/A 09/20/2014   Procedure: PERCUTANEOUS CORONARY ROTOBLATOR INTERVENTION (PCI-R);  Surgeon: [name], MD;  Location: [facility] CATH LAB;  Service: Cardiovascular;  Laterality: N/A;    Allergies  Allergen Reactions  . Iodine Nausea And Vomiting    Turns white in face and sweaty  . Iohexol Other (See Comments)    May have caused nausea and vomiting several years ago   . Shrimp [Shellfish Allergy] Nausea And Vomiting    Turns white in face and sweaty    Family History  Problem Relation Age of Onset  . Diabetes Father   . Heart attack Father 59  . Heart disease Father        CAD    . Cancer Mother     Social History   Socioeconomic History  . Marital status: Married    Spouse name: Not on file  . Number of children: Not on file  . Years of education: Not on file  . Highest education level: Not on file  Occupational History  . Occupation: Di[employer]ability  Social Needs  . Financial resource strain: Not on file  . Food insecurity:    Worry: Not on file    Inability: Not on file  . Transportation needs:    Medical: Not on file    Non-medical: Not on file  Tobacco Use  . Smoking status: Current Every Day Smoker    Packs/day: 1.00    Years: 45.00    Pack years: 45.00    Types: Cigarettes    Last attempt to quit: 10/01/2014    Years since quitting: 3.9  . Smokeless tobacco: Former Network engineer and Sexual Activity  . Alcohol  use: Yes    Alcohol/week: 4.0 standard drinks    Types: 4 Cans of beer per week    Comment: occ  . Drug use: No  . Sexual activity: Yes  Lifestyle  . Physical activity:    Days per week: Not on file    Minutes per session: Not on file  . Stress: Not on file  Relationships  . Social connections:    Talks on phone: Not on file    Gets together: Not on file    Attends religious service: Not on file    Active member of club or organization: Not on file    Attends meetings of clubs or organizations: Not on file    Relationship status: Not on file  Other Topics Concern  . Not on file  Social History Narrative   Married   Gets regular exercise - walks 1/4 mile per day 5 times a week without symptoms     Vitals:   09/14/18 0705  BP: 120/78  Pulse: 68  Resp: 12  Temp: 97.7 F (36.5 C)  SpO2: 100%   Body mass index is 26.97 kg/m.   Wt Readings from Last 3 Encounters:  09/14/18 177 lb 6 oz (80.5 kg)  08/04/18 176 lb 3.2 oz (79.9 kg)  04/08/18 176 lb (79.8 kg)    Physical Exam  Nursing note and vitals reviewed. Constitutional: He is oriented to person, place, and time. He appears well-developed. No distress.   HENT:  Head: Normocephalic and atraumatic.  Right Ear: Tympanic membrane, external ear and ear canal normal.  Left Ear: Tympanic membrane, external ear and ear canal normal.  Mouth/Throat: Oropharynx is clear and moist and mucous membranes are normal.  Missing teeth.  Eyes: Pupils are equal, round, and reactive to light. Conjunctivae and EOM are normal.  Neck: Normal range of motion. No tracheal deviation present. No thyromegaly present.  Cardiovascular: Normal rate and regular rhythm.  No murmur heard. Pulses:      Dorsalis pedis pulses are 2+ on the right side and 2+ on the left side.  Respiratory: Effort normal and breath sounds normal. No respiratory distress.  GI: Soft. He exhibits no mass. There is no hepatomegaly. There is no abdominal tenderness.  Genitourinary:    Genitourinary Comments: Refused,no concerns.   Musculoskeletal:        General: No tenderness or edema.     Comments: No signs of synovitis.  Lymphadenopathy:    He has no cervical adenopathy.       Right: No supraclavicular adenopathy present.       Left: No supraclavicular adenopathy present.  Neurological: He is alert and oriented to person, place, and time. He has normal strength. No cranial nerve deficit or sensory deficit. Coordination and gait normal.  Reflex Scores:      Bicep reflexes are 2+ on the right side and 2+ on the left side.      Patellar reflexes are 2+ on the right side and 2+ on the left side. Skin: Skin is warm. No erythema.  Psychiatric: He has a normal mood and affect. Cognition and memory are normal.  Well groomed, good eye contact.    ASSESSMENT AND PLAN:  Mr. Lynda was seen today for annual exam.  Orders Placed This Encounter  Procedures  . US Aorta  . Flu vaccine HIGH DOSE PF  . Pneumococcal conjugate vaccine 13-valent IM  . Hemoglobin A1c  . PSA(Must document that pt has been informed of limitations of PSA testing.)  .  Hepatitis C antibody screen  . Vitamin B12  .  Ambulatory Referral for Lung Cancer Scre   Lab Results  Component Value Date   [id number] 494 09/14/2018    Lab Results  Component Value Date   PSA 0.97 09/14/2018    Routine general medical examination at a health care facility We discussed the importance of regular physical activity and healthy diet for prevention of chronic illness and/or complications. Preventive guidelines reviewed. Vaccination updated. Fall precautions.  Next CPE in a year.  Hyperlipidemia, unspecified hyperlipidemia type He preffer to hold on labs until 02/2019.  Hyperglycemia Primary prevention through a healthier life style recommended.  -     Hemoglobin A1c  Encounter for abdominal aortic aneurysm (AAA) screening -     US Aorta; Future  Prostate cancer screening -     PSA(Must document that pt has been informed of limitations of PSA testing.)  Encounter for HCV screening test for high risk patient -     Hepatitis C antibody screen  Encounter for screening for lung cancer -     Ambulatory Referral for Lung Cancer Scre  Encounter for immunization -     Flu vaccine HIGH DOSE PF  Need for vaccination against Streptococcus pneumoniae using pneumococcal conjugate vaccine 13 -     Pneumococcal conjugate vaccine 13-valent IM  B12 deficiency No changes in current management, will follow labs done today and will give further recommendations accordingly.   Tobacco abuse We discussed adverse effects of tobacco use as well as benefits of smoking cessation. At this time he is not interested in quitting smoking.    Return in 1 year (on 09/15/2019) for cpe.    Amyjo Mizrachi G. [name], MD  [facility]. [place] office.

## 2018-09-14 NOTE — Assessment & Plan Note (Signed)
No changes in current management, will follow labs done today and will give further recommendations accordingly.  

## 2018-09-14 NOTE — Patient Instructions (Signed)
A few things to remember from today's visit:   Routine general medical examination at a health care facility  Hyperlipidemia, unspecified hyperlipidemia type  B12 deficiency - Plan: Vitamin B12  Hyperglycemia - Plan: Hemoglobin A1c  Encounter for abdominal aortic aneurysm (AAA) screening - Plan: US Aorta  Prostate cancer screening - Plan: PSA(Must document that pt has been informed of limitations of PSA testing.)  Encounter for HCV screening test for high risk patient - Plan: Hepatitis C antibody screen  Encounter for screening for lung cancer - Plan: Ambulatory Referral for Lung Cancer Scre  A few tips:  -As we age balance is not as good as it was, so there is a higher risks for falls. Please remove small rugs and furniture that is "in your way" and could increase the risk of falls. Stretching exercises may help with fall prevention: Yoga and Tai Chi are some examples. Low impact exercise is better, so you are not very achy the next day.  -Sun screen and avoidance of direct sun light recommended. Caution with dehydration, if working outdoors be sure to drink enough fluids.  - Some medications are not safe as we age, increases the risk of side effects and can potentially interact with other medication you are also taken;  including some of over the counter medications. Be sure to let me know when you start a new medication even if it is a dietary/vitamin supplement.   -Healthy diet low in red meet/animal fat and sugar + regular physical activity is recommended.     Please be sure medication list is accurate. If a new problem present, please set up appointment sooner than planned today.

## 2018-09-14 NOTE — Assessment & Plan Note (Signed)
We discussed adverse effects of tobacco use as well as benefits of smoking cessation. At this time he is not interested in quitting smoking.

## 2018-09-15 LAB — HEPATITIS C ANTIBODY
Hepatitis C Ab: NONREACTIVE
SIGNAL TO CUT-OFF: 0.02 (ref <1.00)

## 2018-09-15 LAB — VITAMIN B12: VITAMIN B 12: 831 pg/mL (ref 211–911)

## 2018-09-15 LAB — PSA: PSA: 0.97 ng/mL (ref 0.10–4.00)

## 2018-09-18 ENCOUNTER — Encounter: Payer: Self-pay | Admitting: Family Medicine

## 2018-09-19 ENCOUNTER — Encounter: Payer: Medicare HMO | Admitting: Family Medicine

## 2018-09-19 ENCOUNTER — Other Ambulatory Visit: Payer: Self-pay | Admitting: Acute Care

## 2018-09-19 DIAGNOSIS — Z122 Encounter for screening for malignant neoplasm of respiratory organs: Secondary | ICD-10-CM

## 2018-09-19 DIAGNOSIS — F17210 Nicotine dependence, cigarettes, uncomplicated: Secondary | ICD-10-CM

## 2018-09-19 DIAGNOSIS — Z87891 Personal history of nicotine dependence: Secondary | ICD-10-CM

## 2018-09-27 ENCOUNTER — Ambulatory Visit
Admission: RE | Admit: 2018-09-27 | Discharge: 2018-09-27 | Disposition: A | Discharge status: Home / Self Care | Payer: Medicare HMO | Source: Ambulatory Visit | Attending: Family Medicine | Admitting: Family Medicine

## 2018-09-27 DIAGNOSIS — Z136 Encounter for screening for cardiovascular disorders: Secondary | ICD-10-CM | POA: Diagnosis not present

## 2018-09-27 DIAGNOSIS — Z87891 Personal history of nicotine dependence: Secondary | ICD-10-CM | POA: Diagnosis not present

## 2018-10-03 ENCOUNTER — Encounter: Payer: Self-pay | Admitting: Family Medicine

## 2018-10-12 ENCOUNTER — Ambulatory Visit: Payer: Medicare HMO

## 2018-10-12 ENCOUNTER — Other Ambulatory Visit: Payer: Self-pay | Admitting: Cardiovascular Disease

## 2018-10-12 ENCOUNTER — Encounter: Payer: Medicare HMO | Admitting: Acute Care

## 2018-12-29 ENCOUNTER — Encounter (HOSPITAL_COMMUNITY): Payer: Self-pay

## 2018-12-29 ENCOUNTER — Emergency Department (HOSPITAL_COMMUNITY)
Admission: EM | Admit: 2018-12-29 | Discharge: 2018-12-29 | Disposition: A | Discharge status: Home / Self Care | Payer: Medicare HMO | Attending: Emergency Medicine | Admitting: Emergency Medicine

## 2018-12-29 ENCOUNTER — Ambulatory Visit: Payer: Self-pay

## 2018-12-29 ENCOUNTER — Emergency Department (HOSPITAL_COMMUNITY): Payer: Medicare HMO

## 2018-12-29 ENCOUNTER — Other Ambulatory Visit: Payer: Self-pay

## 2018-12-29 DIAGNOSIS — Z7982 Long term (current) use of aspirin: Secondary | ICD-10-CM | POA: Insufficient documentation

## 2018-12-29 DIAGNOSIS — K529 Noninfective gastroenteritis and colitis, unspecified: Secondary | ICD-10-CM | POA: Diagnosis not present

## 2018-12-29 DIAGNOSIS — R1030 Lower abdominal pain, unspecified: Secondary | ICD-10-CM | POA: Diagnosis present

## 2018-12-29 DIAGNOSIS — Z951 Presence of aortocoronary bypass graft: Secondary | ICD-10-CM | POA: Insufficient documentation

## 2018-12-29 DIAGNOSIS — I2510 Atherosclerotic heart disease of native coronary artery without angina pectoris: Secondary | ICD-10-CM | POA: Diagnosis not present

## 2018-12-29 DIAGNOSIS — F17210 Nicotine dependence, cigarettes, uncomplicated: Secondary | ICD-10-CM | POA: Insufficient documentation

## 2018-12-29 DIAGNOSIS — R69 Illness, unspecified: Secondary | ICD-10-CM | POA: Diagnosis not present

## 2018-12-29 DIAGNOSIS — R1031 Right lower quadrant pain: Secondary | ICD-10-CM | POA: Diagnosis not present

## 2018-12-29 DIAGNOSIS — Z79899 Other long term (current) drug therapy: Secondary | ICD-10-CM | POA: Insufficient documentation

## 2018-12-29 DIAGNOSIS — K760 Fatty (change of) liver, not elsewhere classified: Secondary | ICD-10-CM | POA: Diagnosis not present

## 2018-12-29 DIAGNOSIS — Z7902 Long term (current) use of antithrombotics/antiplatelets: Secondary | ICD-10-CM | POA: Insufficient documentation

## 2018-12-29 LAB — COMPREHENSIVE METABOLIC PANEL
ALT: 49 U/L — ABNORMAL HIGH (ref 0–44)
AST: 25 U/L (ref 15–41)
Albumin: 3.7 g/dL (ref 3.5–5.0)
Alkaline Phosphatase: 64 U/L (ref 38–126)
Anion gap: 9 (ref 5–15)
BUN: 10 mg/dL (ref 8–23)
CO2: 23 mmol/L (ref 22–32)
Calcium: 8.4 mg/dL — ABNORMAL LOW (ref 8.9–10.3)
Chloride: 103 mmol/L (ref 98–111)
Creatinine, Ser: 0.85 mg/dL (ref 0.61–1.24)
GFR calc Af Amer: >60 mL/min (ref >60)
GFR calc non Af Amer: >60 mL/min (ref >60)
Glucose, Bld: 91 mg/dL (ref 70–99)
Potassium: 3.2 mmol/L — ABNORMAL LOW (ref 3.5–5.1)
Sodium: 135 mmol/L (ref 135–145)
Total Bilirubin: 0.4 mg/dL (ref 0.3–1.2)
Total Protein: 6.9 g/dL (ref 6.5–8.1)

## 2018-12-29 LAB — URINALYSIS, ROUTINE W REFLEX MICROSCOPIC
Bilirubin Urine: NEGATIVE
Glucose, UA: NEGATIVE mg/dL
Hgb urine dipstick: NEGATIVE
Ketones, ur: NEGATIVE mg/dL
Leukocytes,Ua: NEGATIVE
Nitrite: NEGATIVE
Protein, ur: NEGATIVE mg/dL
Specific Gravity, Urine: >1.046 — ABNORMAL HIGH (ref 1.005–1.030)
pH: 5 (ref 5.0–8.0)

## 2018-12-29 LAB — CBC WITH DIFFERENTIAL/PLATELET
Abs Immature Granulocytes: 0.02 10*3/uL (ref 0.00–0.07)
Basophils Absolute: 0 10*3/uL (ref 0.0–0.1)
Basophils Relative: 1 %
Eosinophils Absolute: 0.1 10*3/uL (ref 0.0–0.5)
Eosinophils Relative: 1 %
HCT: 45.4 % (ref 39.0–52.0)
Hemoglobin: 15.7 g/dL (ref 13.0–17.0)
Immature Granulocytes: 0 %
Lymphocytes Relative: 20 %
Lymphs Abs: 1.4 10*3/uL (ref 0.7–4.0)
MCH: 31.4 pg (ref 26.0–34.0)
MCHC: 34.6 g/dL (ref 30.0–36.0)
MCV: 90.8 fL (ref 80.0–100.0)
Monocytes Absolute: 0.6 10*3/uL (ref 0.1–1.0)
Monocytes Relative: 9 %
Neutro Abs: 4.8 10*3/uL (ref 1.7–7.7)
Neutrophils Relative %: 69 %
Platelets: 230 10*3/uL (ref 150–400)
RBC: 5 MIL/uL (ref 4.22–5.81)
RDW: 12.5 % (ref 11.5–15.5)
WBC: 7 10*3/uL (ref 4.0–10.5)
nRBC: 0 % (ref 0.0–0.2)

## 2018-12-29 LAB — LIPASE, BLOOD: Lipase: 27 U/L (ref 11–51)

## 2018-12-29 MED ORDER — ACETAMINOPHEN 325 MG PO TABS
650.0000 mg | ORAL_TABLET | Freq: Once | ORAL | Status: AC
  Administered 2018-12-29: 650 mg via ORAL
  Filled 2018-12-29: qty 2

## 2018-12-29 MED ORDER — IOHEXOL 300 MG/ML  SOLN
100.0000 mL | Freq: Once | INTRAMUSCULAR | Status: AC | PRN
  Administered 2018-12-29: 100 mL via INTRAVENOUS

## 2018-12-29 MED ORDER — SODIUM CHLORIDE 0.9% FLUSH: 3.0000 mL | Freq: Once | INTRAVENOUS | Status: DC

## 2018-12-29 MED ORDER — SODIUM CHLORIDE 0.9 % IV BOLUS
1000.0000 mL | Freq: Once | INTRAVENOUS | Status: AC
  Administered 2018-12-29: 1000 mL via INTRAVENOUS

## 2018-12-29 MED ORDER — DICYCLOMINE HCL 10 MG/ML IM SOLN
20.0000 mg | Freq: Once | INTRAMUSCULAR | Status: AC
  Administered 2018-12-29: 20 mg via INTRAMUSCULAR
  Filled 2018-12-29: qty 2

## 2018-12-29 MED ORDER — SODIUM CHLORIDE (PF) 0.9 % IJ SOLN
INTRAMUSCULAR | Status: AC
  Filled 2018-12-29: qty 50

## 2018-12-29 MED ORDER — DICYCLOMINE HCL 20 MG PO TABS: 20.0000 mg | ORAL_TABLET | Freq: Two times a day (BID) | ORAL | 0 refills | Status: DC

## 2018-12-29 MED ORDER — ONDANSETRON 4 MG PO TBDP: 4.0000 mg | ORAL_TABLET | Freq: Three times a day (TID) | ORAL | 0 refills | Status: DC | PRN

## 2018-12-29 NOTE — Telephone Encounter (Signed)
Pt called states that he has had diarrhea for 8 days. He says that he vomited once. He states that now his stool is yellow water. He has not been exposed to anyone sick. He has eaten the same foods as his family. He has been drinking liquids but states when he urinates it is only a trickle. He has headaches no fever. Per protocol pt will go to ER for evaluation. Care advice read to patient. Patient verbalized understanding.  Reason for Disposition . [1] Drinking very little AND [2] dehydration suspected (e.g., no urine > 12 hours, very dry mouth, very lightheaded)  Answer Assessment - Initial Assessment Questions 1. DIARRHEA SEVERITY: "How bad is the diarrhea?" "How many extra stools have you had in the past 24 hours than normal?"    - NO DIARRHEA (SCALE 0)   - MILD (SCALE 1-3): Few loose or mushy BMs; increase of 1-3 stools over normal daily number of stools; mild increase in ostomy output.   -  MODERATE (SCALE 4-7): Increase of 4-6 stools daily over normal; moderate increase in ostomy output. * SEVERE (SCALE 8-10; OR 'WORST POSSIBLE'): Increase of 7 or more stools daily over normal; moderate increase in ostomy output; incontinence.     8 times last night 2. ONSET: "When did the diarrhea begin?"      8 days ago 3. BM CONSISTENCY: "How loose or watery is the diarrhea?"      Water yellow 4. VOMITING: "Are you also vomiting?" If so, ask: "How many times in the past 24 hours?"      once 5. ABDOMINAL PAIN: "Are you having any abdominal pain?" If yes: "What does it feel like?" (e.g., crampy, dull, intermittent, constant)     cramping 6. ABDOMINAL PAIN SEVERITY: If present, ask: "How bad is the pain?"  (e.g., Scale 1-10; mild, moderate, or severe)   - MILD (1-3): doesn't interfere with normal activities, abdomen soft and not tender to touch    - MODERATE (4-7): interferes with normal activities or awakens from sleep, tender to touch    - SEVERE (8-10): excruciating pain, doubled over, unable to do  any normal activities       5 7. ORAL INTAKE: If vomiting, "Have you been able to drink liquids?" "How much fluids have you had in the past 24 hours?"    Able to drink liquid No solids 8. HYDRATION: "Any signs of dehydration?" (e.g., dry mouth [not just dry lips], too weak to stand, dizziness, new weight loss) "When did you last urinate?"     Urinating trickling 9. EXPOSURE: "Have you traveled to a foreign country recently?" "Have you been exposed to anyone with diarrhea?" "Could you have eaten any food that was spoiled?"     No 10. ANTIBIOTIC USE: "Are you taking antibiotics now or have you taken antibiotics in the past 2 months?"     no 11. OTHER SYMPTOMS: "Do you have any other symptoms?" (e.g., fever, blood in stool)       no 12. PREGNANCY: "Is there any chance you are pregnant?" "When was your last menstrual period?"       N/A  Protocols used: DIARRHEA-A-AH

## 2018-12-29 NOTE — Telephone Encounter (Signed)
Will monitor for ED arrival.  

## 2018-12-29 NOTE — Telephone Encounter (Signed)
Pt at [facility] ED for evaluation.  

## 2018-12-29 NOTE — ED Provider Notes (Signed)
[facility] DEPT Provider Note   CSN: [id number] Arrival date & time: 12/29/18  0944    History   Chief Complaint Chief Complaint  Patient presents with   Abdominal Pain   Emesis   Diarrhea    HPI Derrick Stokes is a 68 y.o. male with history of CAD, HLD, nephrolithiasis, GERD, was in today for abdominal pain nausea vomiting diarrhea.  Patient reports that 8 days ago he developed a cramping lower abdominal pain that waxes and wanes between mild and severe and has been present every day.  Patient reports that with onset of lower abdominal pain he developed small amounts of watery diarrhea which have been present for this amount of time.  Patient reports that 2 days ago he developed nausea and vomiting and has been unable to keep any food down for the past 2 days.  He describes nonbloody/nonbilious emesis every time that he eats.  Denies fever/chills, chest pain/shortness of breath, cough, dysuria/hematuria or any additional concerns.    HPI  Past Medical History:  Diagnosis Date   Arthritis    B12 deficiency    Borderline   CAD (coronary artery disease)    a. s/p CABG 01/2011;  b. ETT Myoview 3/14:  Low risk;  c. 09/2014 Cath/PCI: LM nl, LAD 100p, LCX 52m/OM2 60-70p (2.75x28 Synergy DES), RCA dom, 62m (atherectomy, 3.5x24 Synergy DES), PDA nl, LIMA->LAD nl, VG->Diag nl, VG->OM 100, VG->RCA 100.   Hiatal hernia    History of echocardiogram    Echo 10/16: EF 50-55%, no RWMA, Gr 1 DD, mild MR, mild TR   HLD (hyperlipidemia)    Kidney stones    Leg pain    ABIs 4/14:  R 1.2, L 1.2, TBIs normal   Peripheral neuropathy    From trauma   Reactive airway disease 03/18/2018   Tobacco abuse     Patient Active Problem List   Diagnosis Date Noted   Chest pain wtih stable CAD at cath.   03/18/2018   Reactive airway disease 03/18/2018   Coronary artery disease with unstable angina pectoris ([place]) 03/16/2018   Reactive airway disease  that is not asthma, has been coughing more, may be cause of chest pain 09/08/2016   B12 deficiency 06/08/2016   GERD (gastroesophageal reflux disease) 06/08/2016   Low back pain radiating to both legs 06/08/2016   Peripheral neuropathic pain 06/08/2016   CAD (coronary artery disease), native coronary artery 09/17/2014   Unstable angina ([place]) 09/16/2014   Tobacco abuse 01/03/2014   S/P coronary artery bypass graft x 5 03/06/2011   Hyperlipidemia 42/68/3419   HELICOBACTER PYLORI INFECTION 11/05/2008   BARRETTS ESOPHAGUS 11/05/2008   HIATAL HERNIA 11/05/2008   DIVERTICULOSIS, COLON 11/05/2008   COLONIC POLYPS, HX OF 11/05/2008   GASTRITIS 10/22/2008   Anxiety state 10/12/2008   EARLY SATIETY 10/12/2008   CHANGE IN BOWELS 10/12/2008   ABDOMINAL PAIN -GENERALIZED 10/12/2008   ABDOMINAL PAIN, LEFT UPPER QUADRANT 10/01/2008    Past Surgical History:  Procedure Laterality Date   COLONOSCOPY     Neg   CORONARY ARTERY BYPASS GRAFT     EPIDURAL STEROIDS     INTRAVASCULAR PRESSURE WIRE/FFR STUDY N/A 03/17/2018   Procedure: INTRAVASCULAR PRESSURE WIRE/FFR STUDY;  Surgeon: Lorretta Harp, MD;  Location: [facility];  Service: Cardiovascular;  Laterality: N/A;   LEFT HEART CATH AND CORONARY ANGIOGRAPHY N/A 03/17/2018   Procedure: LEFT HEART CATH AND CORONARY ANGIOGRAPHY;  Surgeon: Lorretta Harp, MD;  Location: [facility]  INVASIVE CV LAB;  Service: Cardiovascular;  Laterality: N/A;   LEFT HEART CATHETERIZATION WITH CORONARY/GRAFT ANGIOGRAM N/A 09/18/2014   Procedure: LEFT HEART CATHETERIZATION WITH [name];  Surgeon: [name], MD;  Location: [facility] CATH LAB;  Service: Cardiovascular;  Laterality: N/A;   PERCUTANEOUS CORONARY ROTOBLATOR INTERVENTION (PCI-R) N/A 09/20/2014   Procedure: PERCUTANEOUS CORONARY ROTOBLATOR INTERVENTION (PCI-R);  Surgeon: [name], MD;  Location[facility] CATH LAB;  Service: Cardiovascular;  Laterality: N/A;         Home Medications    Prior to Admission medications   Medication Sig Start Date End Date Taking? Authorizing Provider  aspirin EC 81 MG tablet Take 81 mg by mouth at bedtime.    Yes [provider]  atorvastatin (LIPITOR) 80 MG tablet TAKE 1 TABLET (80 MG TOTAL) BY MOUTH DAILY AT 6 PM. 04/13/18  Yes Sherren Mocha, MD  clopidogrel (PLAVIX) 75 MG tablet TAKE 1 TABLET BY MOUTH EVERY DAY WITH BREAKFAST Patient taking differently: Take 75 mg by mouth daily.  05/18/18  Yes Sherren Mocha, MD  metoprolol tartrate (LOPRESSOR) 25 MG tablet TAKE 1/2 TABLET BY MOUTH TWICE A DAY Patient taking differently: Take 12.5 mg by mouth 2 (two) times daily.  10/12/18  Yes Sherren Mocha, MD  nitroGLYCERIN (NITROSTAT) 0.4 MG SL tablet Place 1 tablet (0.4 mg total) under the tongue every 5 (five) minutes x 3 doses as needed for chest pain. 09/17/17  Yes [name], [name], MD  Omega-3 Fatty Acids (FISH OIL) 1000 MG CPDR Take 1 capsule by mouth every morning.   Yes [provider]  pantoprazole (PROTONIX) 40 MG tablet TAKE 1 TABLET BY MOUTH EVERY DAY Patient taking differently: Take 40 mg by mouth daily.  03/28/18  Yes Sherren Mocha, MD  vitamin B-12 (CYANOCOBALAMIN) 500 MCG tablet Take 500 mcg by mouth daily.   Yes [provider]  dicyclomine (BENTYL) 20 MG tablet Take 1 tablet (20 mg total) by mouth 2 (two) times daily. 12/29/18   Nuala Alpha A, PA-C  ondansetron (ZOFRAN ODT) 4 MG disintegrating tablet Take 1 tablet (4 mg total) by mouth every 8 (eight) hours as needed for nausea or vomiting. 12/29/18   Deliah Boston, PA-C    Family History Family History  Problem Relation Age of Onset   Diabetes Father    Heart attack Father 18   Heart disease Father        CAD   Cancer Mother     Social History Social History   Tobacco Use   Smoking status: Current Every Day Smoker    Packs/day: 1.00    Years: 45.00    Pack years: 45.00    Types: Cigarettes    Last  attempt to quit: 10/01/2014    Years since quitting: 4.2   Smokeless tobacco: Former Systems developer  Substance Use Topics   Alcohol use: Yes    Alcohol/week: 4.0 standard drinks    Types: 4 Cans of beer per week    Comment: occ   Drug use: No     Allergies   Iodine, Iohexol, and Shrimp [shellfish allergy]   Review of Systems Review of Systems  Constitutional: Negative.  Negative for chills and fever.  Respiratory: Negative.  Negative for cough and shortness of breath.   Cardiovascular: Negative.  Negative for chest pain.  Gastrointestinal: Positive for abdominal pain, diarrhea, nausea and vomiting. Negative for blood in stool.  Genitourinary: Negative.  Negative for dysuria, flank pain and hematuria.  All other systems reviewed  and are negative.  Physical Exam Updated Vital Signs BP 111/85 (BP Location: Left Arm)    Pulse 78    Temp 98.4 F (36.9 C) (Oral)    Resp 12    SpO2 99%   Physical Exam Constitutional:      General: He is not in acute distress.    Appearance: Normal appearance. He is well-developed. He is not ill-appearing or diaphoretic.  HENT:     Head: Normocephalic and atraumatic.     Right Ear: External ear normal.     Left Ear: External ear normal.     Nose: Nose normal.  Eyes:     General: Vision grossly intact. Gaze aligned appropriately.     Pupils: Pupils are equal, round, and reactive to light.  Neck:     Musculoskeletal: Full passive range of motion without pain, normal range of motion and neck supple.     Trachea: Trachea and phonation normal. No tracheal tenderness or tracheal deviation.  Cardiovascular:     Rate and Rhythm: Normal rate and regular rhythm.     Pulses:          Dorsalis pedis pulses are 2+ on the right side and 2+ on the left side.     Heart sounds: Normal heart sounds.  Pulmonary:     Effort: Pulmonary effort is normal. No accessory muscle usage or respiratory distress.     Breath sounds: Normal breath sounds and air entry.  Chest:      Comments: Well-healed surgical scar Abdominal:     General: Bowel sounds are decreased. There is no distension.     Palpations: Abdomen is soft.     Tenderness: There is abdominal tenderness in the right lower quadrant, suprapubic area and left lower quadrant. There is no right CVA tenderness, left CVA tenderness, guarding or rebound. Negative signs include Murphy's sign, Rovsing's sign and McBurney's sign.  Genitourinary:    Comments: Exam deferred by patient. Musculoskeletal: Normal range of motion.  Skin:    General: Skin is warm and dry.  Neurological:     Mental Status: He is alert.     GCS: GCS eye subscore is 4. GCS verbal subscore is 5. GCS motor subscore is 6.     Comments: Speech is clear and goal oriented, follows commands Major Cranial nerves without deficit, no facial droop Moves extremities without ataxia, coordination intact Normal Gait  Psychiatric:        Behavior: Behavior normal.    ED Treatments / Results  Labs (all labs ordered are listed, but only abnormal results are displayed) Labs Reviewed  COMPREHENSIVE METABOLIC PANEL - Abnormal; Notable for the following components:      Result Value   Potassium 3.2 (*)    Calcium 8.4 (*)    ALT 49 (*)    All other components within normal limits  URINALYSIS, ROUTINE W REFLEX MICROSCOPIC - Abnormal; Notable for the following components:   Specific Gravity, Urine >1.046 (*)    All other components within normal limits  LIPASE, BLOOD  CBC WITH DIFFERENTIAL/PLATELET    EKG None  Radiology Ct Abdomen Pelvis W Contrast  Result Date: 12/29/2018 CLINICAL DATA:  Right lower abdominal pain and cramping for 8 days. EXAM: CT ABDOMEN AND PELVIS WITH CONTRAST TECHNIQUE: Multidetector CT imaging of the abdomen and pelvis was performed using the standard protocol following bolus administration of intravenous contrast. CONTRAST:  100 mL OMNIPAQUE IOHEXOL 300 MG/ML  SOLN COMPARISON:  CT abdomen pelvis 01/16/2012. FINDINGS:  Lower  chest: Lung bases clear. No pleural or pericardial effusion. Heart size is normal. Hepatobiliary: The liver is low attenuating consistent with fatty infiltration. No focal lesion. The gallbladder is decompressed but otherwise unremarkable. Biliary tree appears normal. Pancreas: Unremarkable. No pancreatic ductal dilatation or surrounding inflammatory changes. Spleen: Normal in size without focal abnormality. Adrenals/Urinary Tract: Adrenal glands are unremarkable. Kidneys are normal, without renal calculi, focal lesion, or hydronephrosis. Bladder is unremarkable. Stomach/Bowel: Stomach is within normal limits. Appendix appears normal. No evidence of bowel wall thickening, distention, or inflammatory changes. Scattered diverticulosis descending and sigmoid colon noted. Vascular/Lymphatic: Aortic atherosclerosis. No enlarged abdominal or pelvic lymph nodes. Reproductive: Prostate is unremarkable. Other: None. Musculoskeletal: No acute bony abnormality or focal lesion. Schmorl's node superior endplates of Z85 and Y85 are unchanged. IMPRESSION: No acute abnormality abdomen or pelvis. No finding to explain the patient's symptoms. Atherosclerosis. Fatty infiltration of the liver. Mild diverticulosis without diverticulitis. Electronically Signed   By: Inge Rise M.D.   On: 12/29/2018 12:13    Procedures Procedures (including critical care time)  Medications Ordered in ED Medications  sodium chloride flush (NS) 0.9 % injection 3 mL (has no administration in time range)  sodium chloride (PF) 0.9 % injection (has no administration in time range)  sodium chloride 0.9 % bolus 1,000 mL (0 mLs Intravenous Stopped 12/29/18 1322)  iohexol (OMNIPAQUE) 300 MG/ML solution 100 mL (100 mLs Intravenous Contrast Given 12/29/18 1142)  dicyclomine (BENTYL) injection 20 mg (20 mg Intramuscular Given 12/29/18 1319)  acetaminophen (TYLENOL) tablet 650 mg (650 mg Oral Given 12/29/18 1317)     Initial Impression /  Assessment and Plan / ED Course  I have reviewed the triage vital signs and the nursing notes.  Pertinent labs & imaging results that were available during my care of the patient were reviewed by me and considered in my medical decision making (see chart for details).  Clinical Course as of Dec 28 1344  Thu Dec 29, 2018  1246 D/C with bentyl and zofran pending urine.   [BM]    Clinical Course User Index [BM] Deliah Boston, PA-C   68 year old male with 8 days of crampy lower abdominal pain which has been waxing and waning with associated diarrhea.  Additionally nausea and vomiting x2 days unable to keep any p.o. down.  He is overall well-appearing in no acute distress on arrival.  No history of fever/chills or other concerns.  Minimal lower abdominal tenderness, no obvious distention, no rebound or peritoneal signs. Supicious for gasteroenteritis however there is concern at this time for possible diverticulitis or early SBO necessitating CT abdomen pelvis after blood work returns.  Of note patient has iohexol allergy listed with reaction as possible nausea and vomiting many years ago.  I have addressed this with the patient and he is unsure if he had an reaction after CT scan in 2013, he received IV contrast during a cardiac catheterization in August 2019 and denies any reaction following this, I have reviewed this procedure and visit and it makes no mention of any nausea vomiting or contrast allergy.  Do not suspect the patient has true contrast allergy today, will proceed with CT abdomen pelvis with contrast discussed with Dr. Darl Householder who agrees with workup. - CBC within normal limits Lipase within normal limits CMP nonacute Urinalysis with elevated specific gravity CT abdomen pelvis:  IMPRESSION:  No acute abnormality abdomen or pelvis. No finding to explain the  patient's symptoms.    Atherosclerosis.    Fatty infiltration of  the liver.    Mild diverticulosis without  diverticulitis.   Fluid bolus, Tylenol and Bentyl given with improvement of symptoms.  Patient resting comfortably no acute distress states improvement and is requesting discharge.  Suspect patient with viral gastroenteritis, no indication for antibiotics or further work-up here in the ED.  Patient has been seen and evaluated by Dr. Darl Householder today who advises discharge with ODT Zofran, Bentyl and PCP follow-up.  Patient tolerating p.o. here in the emergency department, advised to increase water intake and return to emergency department for any new or worsening symptoms.  Vital signs have remained stable throughout ER visit.  At this time there does not appear to be any evidence of an acute emergency medical condition and the patient appears stable for discharge with appropriate outpatient follow up. Diagnosis was discussed with patient who verbalizes understanding of care plan and is agreeable to discharge. I have discussed return precautions with patient who verbalizes understanding of return precautions. Patient encouraged to follow-up with their PCP. All questions answered.  Patient has been discharged in good condition.  Patient's case rediscussed with Dr. Darl Householder who agrees with plan to discharge with follow-up.   Note: Portions of this report may have been transcribed using voice recognition software. Every effort was made to ensure accuracy; however, inadvertent computerized transcription errors may still be present. Final Clinical Impressions(s) / ED Diagnoses   Final diagnoses:  Gastroenteritis    ED Discharge Orders         Ordered    dicyclomine (BENTYL) 20 MG tablet  2 times daily     12/29/18 1323    ondansetron (ZOFRAN ODT) 4 MG disintegrating tablet  Every 8 hours PRN     12/29/18 1323           Gari Crown 12/29/18 1347    Drenda Freeze, MD 12/30/18 [phone]

## 2018-12-29 NOTE — ED Triage Notes (Signed)
Patient dropped off by wife.    Patient C/O constant left-right lower abdominal sharp cramps X 8 days (5/10)  He thought it was getting better and pain came back yesterday.   Patient C/O N/V and Diarrhea.   Patient unable to tolerate PO food or fluids.    2 occurences of emesis in past 24 hours.  3-4 occurrences of diarrhea in past 24 hours. (watery)   A/Ox4 Ambulatory in triage.

## 2018-12-29 NOTE — Discharge Instructions (Addendum)
You have been diagnosed today with gastroenteritis.  At this time there does not appear to be the presence of an emergent medical condition, however there is always the potential for conditions to change. Please read and follow the below instructions.  Please return to the Emergency Department immediately for any new or worsening symptoms or if your symptoms do not improve within 3 days. Please be sure to follow up with your Primary Care Provider within one week regarding your visit today; please call their office to schedule an appointment even if you are feeling better for a follow-up visit. Please take the medication Bentyl as prescribed to help with your symptoms, twice daily.  You may use the Zofran as prescribed to help with nausea and vomiting.  Please drink plenty of water to avoid dehydration.  Your CT scan today showed atherosclerosis, diverticulosis without infection and fatty liver disease, please discuss this with your primary care provider at your next visit.  Get help right away if: You have chest pain. You feel very weak or you pass out (faint). You see blood in your throw-up. Your throw-up looks like coffee grounds. You have bloody or black poop (stools) or poop that look like tar. You have a very bad headache, a stiff neck, or both. You have a rash. You have very bad pain, cramping, or bloating in your belly (abdomen). You have trouble breathing. You are breathing very quickly. Your heart is beating very quickly. Your skin feels cold and clammy. You feel confused. You have pain when you pee. You have signs of dehydration, such as: Dark pee, hardly any pee, or no pee. Cracked lips. Dry mouth. Sunken eyes. Sleepiness. Weakness.  Please read the additional information packets attached to your discharge summary.  Do not take your medicine if  develop an itchy rash, swelling in your mouth or lips, or difficulty breathing; call 911 and seek immediate emergency medical  attention if this occurs.

## 2018-12-29 NOTE — ED Notes (Signed)
Patient is aware that urine sample is needed and urinal is at the bedside.

## 2019-02-21 ENCOUNTER — Other Ambulatory Visit: Payer: Self-pay | Admitting: Cardiovascular Disease

## 2019-02-22 ENCOUNTER — Other Ambulatory Visit: Payer: Self-pay | Admitting: Family Medicine

## 2019-02-22 ENCOUNTER — Other Ambulatory Visit: Payer: Self-pay

## 2019-02-22 ENCOUNTER — Ambulatory Visit (INDEPENDENT_AMBULATORY_CARE_PROVIDER_SITE_OTHER): Payer: Medicare HMO | Admitting: Physician Assistant

## 2019-02-22 ENCOUNTER — Encounter: Payer: Self-pay | Admitting: Physician Assistant

## 2019-02-22 VITALS — BP 142/92 | HR 71 | Wt 181.0 lb

## 2019-02-22 DIAGNOSIS — I25118 Atherosclerotic heart disease of native coronary artery with other forms of angina pectoris: Secondary | ICD-10-CM

## 2019-02-22 DIAGNOSIS — I10 Essential (primary) hypertension: Secondary | ICD-10-CM

## 2019-02-22 DIAGNOSIS — E785 Hyperlipidemia, unspecified: Secondary | ICD-10-CM

## 2019-02-22 DIAGNOSIS — Z720 Tobacco use: Secondary | ICD-10-CM

## 2019-02-22 MED ORDER — METOPROLOL TARTRATE 25 MG PO TABS: 25.0000 mg | ORAL_TABLET | Freq: Two times a day (BID) | ORAL | 6 refills | Status: DC

## 2019-02-22 NOTE — Progress Notes (Signed)
Cardiology Office Note:    Date:  02/22/2019   ID:  Stokes Derrick, DOB 1950/11/19, MRN [id number]  PCP:  [name], [name], MD  Cardiologist:  Sherren Mocha, MD  Electrophysiologist:  None   Referring MD: [name], [name], MD   Chief Complaint  Patient presents with  . Follow-up    CAD    History of Present Illness:    Derrick Stokes is a 68 y.o. male with :  Coronary artery disease s/p CABG  [place] 2016:  S-OM, S-RCA 100; L-LAD, S-Dx ok; PCI:  DES to LCx (leading into OM2); DES to RCA  Cath 8/19: 2/4 grafts patent; LCx and RCA stents patent  Hyperlipidemia  Tobacco use  Peripheral neuropathy  ABIs 07/2018: normal  Mr. [name] underwent cardiac catheterization in August 2019 for recurrent chest pain.  The LIMA graft to the LAD and vein graft to the diagonal were both patent.  The vein graft obtuse marginal and vein graft to RCA were both occluded.  The stents in the LCx and RCA were patent.  FFR of the LCx was negative.  He was last seen by Dr. Burt Knack in January 2020.  He returns for follow-up.  He is here alone.  He has not had any exertional chest pain.  He has chronic dyspnea on exertion.  This is unchanged. He has occasional L arm numbness and occasional chest pain.  There does not seem to be any exacerbating factors.  He has not taken any NTG.  He has not had paroxysmal nocturnal dyspnea, leg swelling, syncope.  He continues to smoke.    Prior CV studies:   The following studies were reviewed today:  AAA [place] 09/27/2018 IMPRESSION: Negative for abdominal aortic aneurysm. The proximal abdominal aorta was not visualized due to overlying bowel gas.  ABIs 08/17/2018 Bilateral, normal  Cardiac catheterization 03/17/18 LAD ostial 100 LCx ostial stent patent with 50 ISR (FFR 0.88) RCA stent patent SVG-RCA 100 LIMA-LAD patent SVG-D1 patent SVG-LCx 100 EF 55-65 Med Rx   Echo 05/02/15 EF 50-55%, normal wall motion, grade 1 diastolic dysfunction, mild MR, normal RVSF,  mild TR  Cardiac cath/staged PCI 09/18/14 LM: patent LCx: patent. Prox stent widely patent. RCA: heavily calcified mid vessel lesion, up to 90%. PCI: orbital atherectomy and 3.5 x 24 Synergy DES to RCA  Cardiac cath/PCI 09/20/14 LM: mild disease.  LAD: occluded proximally.  LCx: Mid 80%, proximal OM2 calcified 60-70% RCA: Mid calcified 80% LIMA-LAD: widely patent.  SVG-Dx: widely patent.  SVG-OM: occluded. SVG-RCA: occluded. LVEDP was 14 mmHg. PCI: 2.75 x 28 Synergy DES to LCx and OM2  Myoview 10/18/12 Inferior thinning versus prior infarct, no ischemia, EF 59%; low risk  Past Medical History:  Diagnosis Date  . Arthritis   . B12 deficiency    Borderline  . CAD (coronary artery disease)    a. s/p CABG 01/2011;  b. ETT Myoview 3/14:  Low risk;  c. 09/2014 Cath/PCI: LM nl, LAD 100p, LCX 28m/OM2 60-70p (2.75x28 Synergy DES), RCA dom, 61m (atherectomy, 3.5x24 Synergy DES), PDA nl, LIMA->LAD nl, VG->Diag nl, VG->OM 100, VG->RCA 100.  [name] Hiatal hernia   . History of echocardiogram    Echo 10/16: EF 50-55%, no RWMA, Gr 1 DD, mild MR, mild TR  . HLD (hyperlipidemia)   . Kidney stones   . Leg pain    ABIs 4/14:  R 1.2, L 1.2, TBIs normal  . Peripheral neuropathy    From trauma  . Reactive airway disease 03/18/2018  .  Tobacco abuse    Surgical Hx: The patient  has a past surgical history that includes Colonoscopy; EPIDURAL STEROIDS; Coronary artery bypass graft; left heart catheterization with coronary/graft angiogram (N/A, 09/18/2014); percutaneous coronary rotoblator intervention (pci-r) (N/A, 09/20/2014); LEFT HEART CATH AND CORONARY ANGIOGRAPHY (N/A, 03/17/2018); and INTRAVASCULAR PRESSURE WIRE/FFR STUDY (N/A, 03/17/2018).   Current Medications: Current Meds  Medication Sig  . aspirin EC 81 MG tablet Take 81 mg by mouth at bedtime.   [name] atorvastatin (LIPITOR) 80 MG tablet TAKE 1 TABLET (80 MG TOTAL) BY MOUTH DAILY AT 6 PM.  . clopidogrel (PLAVIX) 75 MG tablet TAKE 1 TABLET BY  MOUTH EVERY DAY WITH BREAKFAST  . dicyclomine (BENTYL) 20 MG tablet Take 1 tablet (20 mg total) by mouth 2 (two) times daily.  . metoprolol tartrate (LOPRESSOR) 25 MG tablet Take 1 tablet (25 mg total) by mouth 2 (two) times daily.  . nitroGLYCERIN (NITROSTAT) 0.4 MG SL tablet Place 1 tablet (0.4 mg total) under the tongue every 5 (five) minutes x 3 doses as needed for chest pain.  . Omega-3 Fatty Acids (FISH OIL) 1000 MG CPDR Take 1 capsule by mouth every morning.  . ondansetron (ZOFRAN ODT) 4 MG disintegrating tablet Take 1 tablet (4 mg total) by mouth every 8 (eight) hours as needed for nausea or vomiting.  . pantoprazole (PROTONIX) 40 MG tablet TAKE 1 TABLET BY MOUTH EVERY DAY (Patient taking differently: Take 40 mg by mouth daily. )  . vitamin B-12 (CYANOCOBALAMIN) 500 MCG tablet Take 500 mcg by mouth daily.  . [DISCONTINUED] metoprolol tartrate (LOPRESSOR) 25 MG tablet TAKE 1/2 TABLET BY MOUTH TWICE A DAY (Patient taking differently: Take 12.5 mg by mouth 2 (two) times daily. )     Allergies:   Iodine, Iohexol, and Shrimp [shellfish allergy]   Social History   Tobacco Use  . Smoking status: Current Every Day Smoker    Packs/day: 1.00    Years: 45.00    Pack years: 45.00    Types: Cigarettes    Last attempt to quit: 10/01/2014    Years since quitting: 4.3  . Smokeless tobacco: Former Network engineer Use Topics  . Alcohol use: Yes    Alcohol/week: 4.0 standard drinks    Types: 4 Cans of beer per week    Comment: occ  . Drug use: No     Family Hx: The patient's family history includes Cancer in his mother; Diabetes in his father; Heart attack (age of onset: 38) in his father; Heart disease in his father.  ROS:   Please see the history of present illness.    ROS All other systems reviewed and are negative.   EKGs/Labs/Other Test Reviewed:    EKG:  EKG is  ordered today.  The ekg ordered today demonstrates normal sinus rhythm, HR 70, Normal axis, LVH, QTc 425, no significant  changes.   Recent Labs: 03/18/2018: B Natriuretic Peptide 67.0 12/29/2018: ALT 49; BUN 10; Creatinine, Ser 0.85; Hemoglobin 15.7; Platelets 230; Potassium 3.2; Sodium 135   Recent Lipid Panel Lab Results  Component Value Date/Time   CHOL 128 03/17/2018 12:03 AM   CHOL 173 01/22/2017 07:27 AM   TRIG 94 03/17/2018 12:03 AM   HDL 42 03/17/2018 12:03 AM   HDL 45 01/22/2017 07:27 AM   CHOLHDL 3.0 03/17/2018 12:03 AM   LDLCALC 67 03/17/2018 12:03 AM   LDLCALC 97 01/22/2017 07:27 AM   LDLDIRECT 68.0 11/19/2014 07:37 AM     Physical Exam:    VS:  BP (!) 142/92   Pulse 71   Wt 181 lb (82.1 kg)   SpO2 97%   BMI 27.52 kg/m     Wt Readings from Last 3 Encounters:  02/22/19 181 lb (82.1 kg)  09/14/18 177 lb 6 oz (80.5 kg)  08/04/18 176 lb 3.2 oz (79.9 kg)     Physical Exam  Constitutional: He is oriented to person, place, and time. He appears well-developed and well-nourished. No distress.  HENT:  Head: Normocephalic and atraumatic.  Eyes: No scleral icterus.  Neck: No JVD present. No thyromegaly present.  Cardiovascular: Normal rate, regular rhythm and normal heart sounds.  No murmur heard. Pulmonary/Chest: Effort normal. He has decreased breath sounds. He has no rales.  Abdominal: Soft. There is no hepatomegaly.  Musculoskeletal:        General: No edema.  Lymphadenopathy:    He has no cervical adenopathy.  Neurological: He is alert and oriented to person, place, and time.  Skin: Skin is warm and dry.  Psychiatric: He has a normal mood and affect.    ASSESSMENT & PLAN:    1. Coronary artery disease involving native coronary artery of native heart with other form of angina pectoris ([place]) Hx of CABG in 2012, DES to LCx and DES to RCA in 2016.  Cardiac catheterization in 02/2018 demonstrated 2/4 grafts patent and patent LCx and RCA stents.  He is having some occasional chest pain as well as L arm numbness.  But he has not had exertional symptoms.  His ECG is unchanged.  We  discussed +/- proceeding with a Lexiscan Myoview.  At this point, I think adjusting his beta-blocker and seeing him back in a few months is more reasonable.  He is comfortable with this.  -Increase metoprolol tartrate to 25 mg twice daily   -Continue ASA, Clopidogrel, beta-blocker, statin  -FU in 3 mos., sooner if his symptoms progress  2. Essential hypertension BP is usually well controlled.  It is elevated today.  Adjust Metoprolol as noted.  Continue to monitor.  Check BMET with upcoming fasting Lipids.   3. Hyperlipidemia, unspecified hyperlipidemia type Continue statin.  Obtain follow up Lipids, LFTs.   4. Tobacco abuse I have recommended that he quit smoking.     Dispo:  Return in about 3 months (around 05/25/2019) for Routine Follow Up, w/ Dr. Burt Knack, or Richardson Dopp, PA-C.   Medication Adjustments/Labs and Tests Ordered: Current medicines are reviewed at length with the patient today.  Concerns regarding medicines are outlined above.  Tests Ordered: Orders Placed This Encounter  Procedures  . Basic Metabolic Panel (BMET)  . Hepatic function panel  . Lipid Profile   Medication Changes: Meds ordered this encounter  Medications  . metoprolol tartrate (LOPRESSOR) 25 MG tablet    Sig: Take 1 tablet (25 mg total) by mouth 2 (two) times daily.    Dispense:  60 tablet    Refill:  6    Signed, Richardson Dopp, PA-C  02/22/2019 5:16 PM    [facility] [address], [place], [place]  [address] Phone: [phone]; Fax: [phone]

## 2019-02-22 NOTE — Patient Instructions (Addendum)
Medication Instructions:  Your physician has recommended you make the following change in your medication:  1.  Increase Metoprolol one tablet (25 mg ) twice daily, sent in today to requested pharmacy.   Labwork: Your physician recommends that you return for a FASTING lipid profile: lipid/lft/bmet August 19 between 8-5 no food after midnite August 18.   Testing/Procedures: -None  Follow-Up: Your physician recommends that you keep your scheduled  follow-up appointment with Richardson Dopp, PA November 18 @ 9:45 am.   Any Other Special Instructions Will Be Listed Below (If Applicable).     If you need a refill on your cardiac medications before your next appointment, please call your pharmacy.

## 2019-03-08 ENCOUNTER — Other Ambulatory Visit: Payer: Self-pay

## 2019-03-08 ENCOUNTER — Other Ambulatory Visit: Payer: Medicare HMO | Admitting: *Deleted

## 2019-03-08 DIAGNOSIS — E785 Hyperlipidemia, unspecified: Secondary | ICD-10-CM

## 2019-03-08 DIAGNOSIS — I25118 Atherosclerotic heart disease of native coronary artery with other forms of angina pectoris: Secondary | ICD-10-CM | POA: Diagnosis not present

## 2019-03-08 DIAGNOSIS — I10 Essential (primary) hypertension: Secondary | ICD-10-CM | POA: Diagnosis not present

## 2019-03-08 DIAGNOSIS — Z720 Tobacco use: Secondary | ICD-10-CM | POA: Diagnosis not present

## 2019-03-08 LAB — HEPATIC FUNCTION PANEL
ALT: 44 IU/L (ref 0–44)
AST: 22 IU/L (ref 0–40)
Albumin: 4.3 g/dL (ref 3.8–4.8)
Alkaline Phosphatase: 80 IU/L (ref 39–117)
Bilirubin Total: 0.9 mg/dL (ref 0.0–1.2)
Bilirubin, Direct: 0.23 mg/dL (ref 0.00–0.40)
Total Protein: 7 g/dL (ref 6.0–8.5)

## 2019-03-08 LAB — LIPID PANEL
Chol/HDL Ratio: 3.7 ratio (ref 0.0–5.0)
Cholesterol, Total: 133 mg/dL (ref 100–199)
HDL: 36 mg/dL — ABNORMAL LOW (ref >39)
LDL Calculated: 68 mg/dL (ref 0–99)
Triglycerides: 146 mg/dL (ref 0–149)
VLDL Cholesterol Cal: 29 mg/dL (ref 5–40)

## 2019-03-08 LAB — BASIC METABOLIC PANEL
BUN/Creatinine Ratio: 18 (ref 10–24)
BUN: 14 mg/dL (ref 8–27)
CO2: 23 mmol/L (ref 20–29)
Calcium: 9.7 mg/dL (ref 8.6–10.2)
Chloride: 99 mmol/L (ref 96–106)
Creatinine, Ser: 0.77 mg/dL (ref 0.76–1.27)
GFR calc Af Amer: 109 mL/min/{1.73_m2} (ref >59)
GFR calc non Af Amer: 95 mL/min/{1.73_m2} (ref >59)
Glucose: 97 mg/dL (ref 65–99)
Potassium: 4.6 mmol/L (ref 3.5–5.2)
Sodium: 138 mmol/L (ref 134–144)

## 2019-03-25 ENCOUNTER — Other Ambulatory Visit: Payer: Self-pay | Admitting: Cardiovascular Disease

## 2019-04-11 ENCOUNTER — Other Ambulatory Visit: Payer: Self-pay | Admitting: Cardiovascular Disease

## 2019-04-11 DIAGNOSIS — E785 Hyperlipidemia, unspecified: Secondary | ICD-10-CM

## 2019-04-25 ENCOUNTER — Other Ambulatory Visit: Payer: Self-pay | Admitting: Physician Assistant

## 2019-04-25 DIAGNOSIS — R69 Illness, unspecified: Secondary | ICD-10-CM | POA: Diagnosis not present

## 2019-04-26 ENCOUNTER — Encounter: Payer: Self-pay | Admitting: Family Medicine

## 2019-05-25 ENCOUNTER — Telehealth: Payer: Self-pay | Admitting: Acute Care

## 2019-05-25 NOTE — Telephone Encounter (Signed)
[place] x 1 to see if pt would like to be rescheduled for [facility] and CT (New referral and order will need to be placed) - Leave in my box to f/u on

## 2019-06-01 NOTE — Telephone Encounter (Signed)
Will close this message and refer to referral notes 

## 2019-06-02 ENCOUNTER — Telehealth: Payer: Self-pay | Admitting: *Deleted

## 2019-06-02 NOTE — Telephone Encounter (Signed)
S/w pt's wife per (DPR) about upcoming appt with Derrick Stokes on Nov 18.  Derrick Stokes is [place] in the am and pt cannot hear well.  Appt was R/S for in person on Dec 8.  Pt is not having any issues at this time.

## 2019-06-07 ENCOUNTER — Ambulatory Visit: Payer: Medicare HMO | Admitting: Physician Assistant

## 2019-06-26 NOTE — Progress Notes (Signed)
Cardiology Office Note:    Date:  06/27/2019   ID:  Derrick Stokes, DOB June 28, 1951, MRN [id number]  PCP:  [name], [name], MD  Cardiologist:  Sherren Mocha, MD  Electrophysiologist:  None   Referring MD: [name], [name], MD   Chief Complaint  Patient presents with  . Follow-up    CAD    History of Present Illness:    Derrick Stokes is a 68 y.o. male with:   Coronary artery disease s/p CABG ? [place] 2016:  S-OM, S-RCA 100; L-LAD, S-Dx ok; PCI:  DES to LCx (leading into OM2); DES to RCA ? Chest pain >> Cath 8/19: 2/4 grafts patent; LCx and RCA stents patent  Hyperlipidemia  Tobacco use  Peripheral neuropathy ? ABIs 07/2018: normal  [name] was last seen in 02/2019.  He was having some chest pain at times.  I adjusted his beta-blocker for optimal medical therapy.    He returns for follow-up.  He is here alone.  Since last seen, he has not had significant chest discomfort.  He has an occasional pain from time to time.  This is a fairly chronic symptom for him without significant change.  He remains fairly active and walks on a daily basis.  He has not had exertional chest symptoms.  He has not had significant shortness of breath, syncope, leg swelling.  He has a plan to quit smoking 07/21/2019.    Prior CV studies:   The following studies were reviewed today:  AAA [place] 09/27/2018 IMPRESSION: Negative for abdominal aortic aneurysm. The proximal abdominal aorta was not visualized due to overlying bowel gas.  ABIs 08/17/2018 Bilateral, normal  Cardiac catheterization 03/17/18 LAD ostial 100 LCx ostial stent patent with 50 ISR (FFR 0.88) RCA stent patent SVG-RCA 100 LIMA-LAD patent SVG-D1 patent SVG-LCx 100 EF 55-65 Med Rx   Echo 05/02/15 EF 50-55%, normal wall motion, grade 1 diastolic dysfunction, mild MR, normal RVSF, mild TR  Cardiac cath/staged PCI 09/18/14 LM: patent LCx: patent. Prox stent widely patent. RCA: heavily calcified mid vessel lesion, up to  90%. PCI: orbital atherectomy and 3.5 x 24 Synergy DES to RCA  Cardiac cath/PCI 09/20/14 LM: mild disease.  LAD: occluded proximally.  LCx: Mid 80%, proximal OM2 calcified 60-70% RCA: Mid calcified 80% LIMA-LAD: widely patent.  SVG-Dx: widely patent.  SVG-OM: occluded. SVG-RCA: occluded. LVEDP was 14 mmHg. PCI: 2.75 x 28 Synergy DES to LCx and OM2  Myoview 10/18/12 Inferior thinning versus prior infarct, no ischemia, EF 59%; low risk  Past Medical History:  Diagnosis Date  . Arthritis   . B12 deficiency    Borderline  . CAD (coronary artery disease)    a. s/p CABG 01/2011;  b. ETT Myoview 3/14:  Low risk;  c. 09/2014 Cath/PCI: LM nl, LAD 100p, LCX 23m/OM2 60-70p (2.75x28 Synergy DES), RCA dom, 24m (atherectomy, 3.5x24 Synergy DES), PDA nl, LIMA->LAD nl, VG->Diag nl, VG->OM 100, VG->RCA 100.  [name] Hiatal hernia   . History of echocardiogram    Echo 10/16: EF 50-55%, no RWMA, Gr 1 DD, mild MR, mild TR  . HLD (hyperlipidemia)   . Kidney stones   . Leg pain    ABIs 4/14:  R 1.2, L 1.2, TBIs normal  . Peripheral neuropathy    From trauma  . Reactive airway disease 03/18/2018  . Tobacco abuse    Surgical Hx: The patient  has a past surgical history that includes Colonoscopy; EPIDURAL STEROIDS; Coronary artery bypass graft; left heart catheterization with  coronary/graft angiogram (N/A, 09/18/2014); percutaneous coronary rotoblator intervention (pci-r) (N/A, 09/20/2014); LEFT HEART CATH AND CORONARY ANGIOGRAPHY (N/A, 03/17/2018); and INTRAVASCULAR PRESSURE WIRE/FFR STUDY (N/A, 03/17/2018).   Current Medications: Current Meds  Medication Sig  . aspirin EC 81 MG tablet Take 81 mg by mouth at bedtime.   [name] atorvastatin (LIPITOR) 80 MG tablet TAKE 1 TABLET (80 MG TOTAL) BY MOUTH DAILY AT 6 PM.  . clopidogrel (PLAVIX) 75 MG tablet TAKE 1 TABLET BY MOUTH EVERY DAY WITH BREAKFAST  . metoprolol tartrate (LOPRESSOR) 25 MG tablet Take 1 tablet (25 mg total) by mouth 2 (two) times daily.  .  nitroGLYCERIN (NITROSTAT) 0.4 MG SL tablet PLACE 1 TABLET UNDER THE TONGUE EVERY 5 MINUTES X 3 DOSES AS NEEDED FOR CHEST PAIN.  [name] Omega-3 Fatty Acids (FISH OIL) 1000 MG CPDR Take 1 capsule by mouth every morning.  . pantoprazole (PROTONIX) 40 MG tablet TAKE 1 TABLET BY MOUTH EVERY DAY  . vitamin B-12 (CYANOCOBALAMIN) 500 MCG tablet Take 500 mcg by mouth daily.     Allergies:   Iodine, Iohexol, and Shrimp [shellfish allergy]   Social History   Tobacco Use  . Smoking status: Current Every Day Smoker    Packs/day: 1.00    Years: 45.00    Pack years: 45.00    Types: Cigarettes    Last attempt to quit: 10/01/2014    Years since quitting: 4.7  . Smokeless tobacco: Former Network engineer Use Topics  . Alcohol use: Yes    Alcohol/week: 4.0 standard drinks    Types: 4 Cans of beer per week    Comment: occ  . Drug use: No     Family Hx: The patient's family history includes Cancer in his mother; Diabetes in his father; Heart attack (age of onset: 21) in his father; Heart disease in his father.  ROS:   Please see the history of present illness.    ROS All other systems reviewed and are negative.   EKGs/Labs/Other Test Reviewed:    EKG:  EKG is not ordered today.  The ekg ordered today demonstrates n/a  Recent Labs: 12/29/2018: Hemoglobin 15.7; Platelets 230 03/08/2019: ALT 44; BUN 14; Creatinine, Ser 0.77; Potassium 4.6; Sodium 138   Recent Lipid Panel Lab Results  Component Value Date/Time   CHOL 133 03/08/2019 09:05 AM   TRIG 146 03/08/2019 09:05 AM   HDL 36 (L) 03/08/2019 09:05 AM   CHOLHDL 3.7 03/08/2019 09:05 AM   CHOLHDL 3.0 03/17/2018 12:03 AM   LDLCALC 68 03/08/2019 09:05 AM   LDLDIRECT 68.0 11/19/2014 07:37 AM    Physical Exam:    VS:  BP 120/78   Pulse 79   Ht 5\' 8"  (1.727 m)   Wt 179 lb 3.2 oz (81.3 kg)   SpO2 98%   BMI 27.25 kg/m     Wt Readings from Last 3 Encounters:  06/27/19 179 lb 3.2 oz (81.3 kg)  02/22/19 181 lb (82.1 kg)  09/14/18 177 lb 6 oz  (80.5 kg)     Physical Exam  Constitutional: He is oriented to person, place, and time. He appears well-developed and well-nourished. No distress.  HENT:  Head: Normocephalic and atraumatic.  Neck: Neck supple. No JVD present.  Cardiovascular: Normal rate, regular rhythm, S1 normal and S2 normal.  No murmur heard. Pulmonary/Chest: Breath sounds normal. He has no rales.  Abdominal: Soft. There is no hepatomegaly.  Musculoskeletal:        General: No edema.  Neurological: He is alert and  oriented to person, place, and time.  Skin: Skin is warm and dry.    ASSESSMENT & PLAN:    1. Coronary artery disease involving native coronary artery of native heart with angina pectoris ([place]) Hx of CABG in 2012, DES to LCx and DES to RCA in 2016.  Cardiac catheterization in 02/2018 demonstrated 2/4 grafts patent and patent LCx and RCA stents.  He is currently doing well with minimal chest symptoms at times.  Most recent LDL was optimal.  Blood pressure is well controlled.  Continue current therapy with aspirin, atorvastatin, clopidogrel, metoprolol tartrate.  2. Tobacco abuse He and his wife both plan to quit at the start of the new year.   Dispo:  Return in about 6 months (around 12/26/2019) for Routine Follow Up, w/ Dr. Burt Knack, or Richardson Dopp, PA-C, (virtual or in-person).   Medication Adjustments/Labs and Tests Ordered: Current medicines are reviewed at length with the patient today.  Concerns regarding medicines are outlined above.  Tests Ordered: No orders of the defined types were placed in this encounter.  Medication Changes: No orders of the defined types were placed in this encounter.   Signed, Richardson Dopp, PA-C  06/27/2019 8:40 AM    [facility] [address], [place], [place]  [address] Phone: [phone]; Fax: [phone]

## 2019-06-27 ENCOUNTER — Other Ambulatory Visit: Payer: Self-pay

## 2019-06-27 ENCOUNTER — Ambulatory Visit: Payer: Medicare HMO | Admitting: Physician Assistant

## 2019-06-27 ENCOUNTER — Encounter: Payer: Self-pay | Admitting: Physician Assistant

## 2019-06-27 VITALS — BP 120/78 | HR 79 | Ht 68.0 in | Wt 179.2 lb

## 2019-06-27 DIAGNOSIS — Z720 Tobacco use: Secondary | ICD-10-CM | POA: Diagnosis not present

## 2019-06-27 DIAGNOSIS — I25119 Atherosclerotic heart disease of native coronary artery with unspecified angina pectoris: Secondary | ICD-10-CM | POA: Diagnosis not present

## 2019-06-27 NOTE — Patient Instructions (Signed)
Medication Instructions:   Your physician recommends that you continue on your current medications as directed. Please refer to the Current Medication list given to you today.  *If you need a refill on your cardiac medications before your next appointment, please call your pharmacy*  Lab Work: [facility]   If you have labs (blood work) drawn today and your tests are completely normal, you will receive your results only by: [name] MyChart Message (if you have MyChart) OR . A paper copy in the mail If you have any lab test that is abnormal or we need to change your treatment, we will call you to review the results.  Testing/Procedures: NONE ORDERED  TODAY   Follow-Up: At [facility], you and your health needs are our priority.  As part of our continuing mission to provide you with exceptional heart care, we have created designated Provider Care Teams.  These Care Teams include your primary Cardiologist (physician) and Advanced Practice Providers (APPs -  Physician Assistants and Nurse Practitioners) who all work together to provide you with the care you need, when you need it.  Your next appointment:   6 month(s)  The format for your next appointment:   Either In Person or Virtual  Provider:   You may see Sherren Mocha, MD or one of the following Advanced Practice Providers on your designated Care Team:    Richardson Dopp, PA-C  [place], [place]  Daune Perch, NP   Other Instructions

## 2019-08-03 ENCOUNTER — Other Ambulatory Visit: Payer: Self-pay | Admitting: *Deleted

## 2019-08-03 DIAGNOSIS — F17210 Nicotine dependence, cigarettes, uncomplicated: Secondary | ICD-10-CM

## 2019-08-03 DIAGNOSIS — Z87891 Personal history of nicotine dependence: Secondary | ICD-10-CM

## 2019-08-15 ENCOUNTER — Encounter: Payer: Self-pay | Admitting: Family Medicine

## 2019-08-16 ENCOUNTER — Encounter: Payer: Self-pay | Admitting: Acute Care

## 2019-08-16 ENCOUNTER — Ambulatory Visit
Admission: RE | Admit: 2019-08-16 | Discharge: 2019-08-16 | Disposition: A | Discharge status: Home / Self Care | Payer: Medicare HMO | Source: Ambulatory Visit | Attending: Acute Care | Admitting: Acute Care

## 2019-08-16 ENCOUNTER — Ambulatory Visit (INDEPENDENT_AMBULATORY_CARE_PROVIDER_SITE_OTHER): Payer: Medicare HMO | Admitting: Acute Care

## 2019-08-16 ENCOUNTER — Other Ambulatory Visit: Payer: Self-pay

## 2019-08-16 VITALS — BP 118/80 | HR 60 | Resp 16 | Ht 68.0 in | Wt 180.4 lb

## 2019-08-16 DIAGNOSIS — F17210 Nicotine dependence, cigarettes, uncomplicated: Secondary | ICD-10-CM

## 2019-08-16 DIAGNOSIS — Z87891 Personal history of nicotine dependence: Secondary | ICD-10-CM

## 2019-08-16 DIAGNOSIS — R69 Illness, unspecified: Secondary | ICD-10-CM | POA: Diagnosis not present

## 2019-08-16 NOTE — Progress Notes (Signed)
Subjective Patient presents to [facility] and seen by the pharmacist for smoking cessation counseling.   Patient was referred and last seen by  Eric Form, NP, on 08/16/2019. PMH is significant for  unstable angina, CAD, Barretts esophagus, gastritis, diverticulitis, GERD, anxiety, S/P CABG x 5 (2016), HLD, vitamin B12 deficiency, and reactive airway disease.  Frederik Schmidt, CPT, ran test claims for smoking cessation agents covered by patient's insurance plan and determined the following information: 1. Chantix - [place] required  2. Bupropion XL 300 mg -$47.00  3. Nicotine patch 21 mg - otc not covered  4. Nicotine gum 4 mg -otc not covered  5. Nicotine lozenge 4 mg-otc not covered   Patient presents today for initial appt with pharmacy team. Patient reports smoking in the house and in the car. Patient has friends/family that smoke, but states that he thinks they would be willing to stop smoking around them.   Social History   Tobacco Use  Smoking Status Current Every Day Smoker  . Packs/day: 1.00  . Years: 45.00  . Pack years: 45.00  . Types: Cigarettes  . Last attempt to quit: 10/01/2014  . Years since quitting: 4.8  Smokeless Tobacco Former Systems developer     Tobacco Use History  Age when started using tobacco on a daily basis 18  Type: cigarettes.  Number of cigarettes per day 20, brand [employer].  Smokes first cigarette within 5 minutes after waking.  Does not wake at night to smoke  Triggers include nerves, after meals, stress  Quit Attempt History   Most recent quit attempt 2015  Longest time ever been tobacco free 6 months  Methods tried in the past include None.   Rates IMPORTANCE of quitting tobacco on 1-10 scale of 10.  Rates READINESS of quitting tobacco on 1-10 scale of 5.  Rates CONFIDENCE of quitting tobacco on 1-10 scale of 3. Motivators to quitting include wife, chronic cough; barriers include wanting to smoke with drinking a beer  Immunization  History  Administered Date(s) Administered  . Influenza, High Dose Seasonal PF 09/14/2018, 04/25/2019  . Influenza,inj,Quad PF,6+ Mos 04/17/2014, 05/06/2015, 06/08/2016  . Influenza,inj,Quad PF,6-35 Mos 04/25/2019  . Moderna SARS-COVID-2 Vaccination 08/15/2019  . Pneumococcal Conjugate-13 09/14/2018  . Pneumococcal Polysaccharide-23 06/08/2016  . Tdap 05/01/2014  . Tetanus 04/17/2014  . Zoster 05/01/2014     Assessment and Plan  1. Smoking Cessation (Quit Date: 09/04/2019) -Thoroughly discussed with patient the recommendations from the [place] smoking cessation guidelines. Patient is willing to initially try smoking cessation "cold [place]". If that is not successful then he will obtain smoking cessation agents via [place] [facility] due to lack of adequate insurance coverage for smoking cessation agents; specifically he will attempt to obtain nicotine patch 21 mg daily, nicotine lozenge 4 mg, and Chantix. Patient counseled on purpose, proper use, and potential adverse effects. Will follow up with patient on 09/01/2019 to re-assess readiness to quit on 09/04/2019.  Nonpharmacologic -Walking, fishing, gardening, using hard candies, buying a new video game, swimming in the pool  Nicotine Patch Patient counseled on purpose, proper use, and potential adverse effects, including mild itching or redness at the point of application, headache, trouble sleeping, and/or vivid dreams     Patch Schedule for >10 cigarettes daily -Weeks 1-6: one 21 mg patch daily -Weeks 7-8: one 14 mg patch daily -Weeks 9-10: one 7 mg patch daily  Patch Schedule for <10 cigarettes daily -Weeks 1 to 6: one nicotine patch (14 mg) daily. I  will call and re-assess how you are doing at the end of 6 weeks to see how you are doing on 14 mg patch and if you are ready to decrease dose of patch. -Weeks 7-8: one nicotine patch (7 mg) daily  Nicotine Lozenge Patient counseled on purpose, proper use, and potential  adverse effects including nausea, hiccups, cough, and heartburn.  Instructed patient to use  2 mg unless the smoke within 30 minutes of waking up in which they should use 4 mg.  Lozenge dosing schedule Weeks 1 to 6: 1 lozenge every 1 to 2 hours (maximum: 5 lozenges every 6 hours; 20 lozenges/day); to increase chances of quitting, use at least 9 lozenges/day during the first 6 weeks   Weeks 7 to 9: 1 lozenge every 2 to 4 hours (maximum: 5 lozenges every 6 hours; 20 lozenges/day)   Weeks 10 to 12: 1 lozenge every 4 to 8 hours (maximum: 5 lozenges every 6 hours; 20 lozenges/day)  Varenicline -Initiated varenicline titration of 0.5 mg by mouth once daily with food x3 days, then 0.5 mg by mouth twice daily with food x4 days, then 1 mg by mouth twice daily with food thereafter.  CrCL greater than 30 mL/min. Patient counseled on purpose, proper use, and potential adverse effects, including GI upset, and potential change in mood.   -Provided information on 1 800-QUIT NOW support program.   2. Medication Reconciliation A drug regimen assessment was performed, including review of allergies, interactions, disease-state management, dosing and immunization history. Medications were reviewed with the patient, including name, instructions, indication, goals of therapy, potential side effects, importance of adherence, and safe use. Confirmed with patient that medication list is currently up to date.  3. Immunizations Patient is up to date with all vaccinations.  Thank you for involving pharmacy to assist in providing this patient's care.   Drexel Iha, PharmD PGY2 Ambulatory Care Pharmacy Resident

## 2019-08-16 NOTE — Progress Notes (Signed)
Shared Decision Making Visit Lung Cancer Screening Program [id number])   Eligibility:  Age 69 y.o.  Pack Years Smoking History Calculation 58 pack year smoking history (# packs/per year x # years smoked)  Recent History of coughing up blood  no  Unexplained weight loss? no ( >Than 15 pounds within the last 6 months )  Prior History Lung / other cancer no (Diagnosis within the last 5 years already requiring surveillance chest CT Scans).  Smoking Status Current Smoker  Former Smokers: Years since quit: NA  Quit Date: NA  Visit Components:  Discussion included one or more decision making aids. yes  Discussion included risk/benefits of screening. yes  Discussion included potential follow up diagnostic testing for abnormal scans. yes  Discussion included meaning and risk of over diagnosis. yes  Discussion included meaning and risk of False Positives. yes  Discussion included meaning of total radiation exposure. yes  Counseling Included:  Importance of adherence to annual lung cancer LDCT screening. yes  Impact of comorbidities on ability to participate in the program. yes  Ability and willingness to under diagnostic treatment. yes  Smoking Cessation Counseling:  Current Smokers:   Discussed importance of smoking cessation. yes  Information about tobacco cessation classes and interventions provided to patient. yes  Patient provided with "ticket" for LDCT Scan. yes  Symptomatic Patient. no  Counseling NA  Diagnosis Code: Tobacco Use Z72.0  Asymptomatic Patient yes  Counseling (Intermediate counseling: > three minutes counseling) [id number]  Former Smokers:   Discussed the importance of maintaining cigarette abstinence. yes  Diagnosis Code: Personal History of Nicotine Dependence. [id number]  Information about tobacco cessation classes and interventions provided to patient. Yes  Patient provided with "ticket" for LDCT Scan. yes  Written Order for Lung Cancer  Screening with LDCT placed in Epic. Yes (CT Chest Lung Cancer Screening Low Dose W/O CM) [id number] Z12.2-Screening of respiratory organs Z87.891-Personal history of nicotine dependence    BP 118/80   Pulse 60   Resp 16   Ht 5\' 8"  (1.727 m)   Wt 180 lb 6.4 oz (81.8 kg)   SpO2 95%   BMI 27.43 kg/m    I have spent 25 minutes of face to face time with [name] discussing the risks and benefits of lung cancer screening. We viewed a power point together that explained in detail the above noted topics. We paused at intervals to allow for questions to be asked and answered to ensure understanding.We discussed that the single most powerful action that he can take to decrease his risk of developing lung cancer is to quit smoking. We discussed whether or not he is ready to commit to setting a quit date. We discussed options for tools to aid in quitting smoking including nicotine replacement therapy, non-nicotine medications, support groups, Quit Smart classes, and behavior modification. We discussed that often times setting smaller, more achievable goals, such as eliminating 1 cigarette a day for a week and then 2 cigarettes a day for a week can be helpful in slowly decreasing the number of cigarettes smoked. This allows for a sense of accomplishment as well as providing a clinical benefit. I gave him the " Be Stronger Than Your Excuses" card with contact information for community resources, classes, free nicotine replacement therapy, and access to mobile apps, text messaging, and on-line smoking cessation help. I have also given him my card and contact information in the event he needs to contact me. We discussed the time and location of the scan, and that  either Doroteo Glassman RN or I will call with the results within 24-48 hours of receiving them. I have offered him  a copy of the power point we viewed  as a resource in the event they need reinforcement of the concepts we discussed today in the office. The  patient verbalized understanding of all of  the above and had no further questions upon leaving the office. They have my contact information in the event they have any further questions.  I spent 4 minutes counseling on smoking cessation and the health risks of continued tobacco abuse.  I explained to the patient that there has been a high incidence of coronary artery disease noted on these exams. I explained that this is a non-gated exam therefore degree or severity cannot be determined. This patient is currently on statin therapy. I have asked the patient to follow-up with their PCP regarding any incidental finding of coronary artery disease and management with diet or medication as their PCP  feels is clinically indicated. The patient verbalized understanding of the above and had no further questions upon completion of the visit.  The patient was counseled on smoking cessation. He is going to think about an appointment with the pharmacist for smoking cessation pharmaceutical options.   Magdalen Spatz, MSN, AGACNP-BC [facility] 08/16/2019

## 2019-08-16 NOTE — Patient Instructions (Signed)
Thank you for participating in the [facility] Screening Program. It was our pleasure to meet you today. We will call you with the results of your scan within the next few days. Your scan will be assigned a Lung RADS category score by the physicians reading the scans.  This Lung RADS score determines follow up scanning.  See below for description of categories, and follow up screening recommendations. We will be in touch to schedule your follow up screening annually or based on recommendations of our providers. We will fax a copy of your scan results to your Primary Care Physician, or the physician who referred you to the program, to ensure they have the results. Please call the office if you have any questions or concerns regarding your scanning experience or results.  Our office number is [phone]. Please speak with Denise Phelps, RN. She is our Lung Cancer Screening RN. If she is unavailable when you call, please have the office staff send her a message. She will return your call at her earliest convenience. Remember, if your scan is normal, we will scan you annually as long as you continue to meet the criteria for the program. (Age 55-77, Current smoker or smoker who has quit within the last 15 years). If you are a smoker, remember, quitting is the single most powerful action that you can take to decrease your risk of lung cancer and other pulmonary, breathing related problems. We know quitting is hard, and we are here to help.  Please let us know if there is anything we can do to help you meet your goal of quitting. If you are a former smoker, congratulations. We are proud of you! Remain smoke free! Remember you can refer friends or family members through the number above.  We will screen them to make sure they meet criteria for the program. Thank you for helping us take better care of you by participating in Lung Screening.  Lung RADS Categories:  Lung RADS 1: no nodules  or definitely non-concerning nodules.  Recommendation is for a repeat annual scan in 12 months.  Lung RADS 2:  nodules that are non-concerning in appearance and behavior with a very low likelihood of becoming an active cancer. Recommendation is for a repeat annual scan in 12 months.  Lung RADS 3: nodules that are probably non-concerning , includes nodules with a low likelihood of becoming an active cancer.  Recommendation is for a 6-month repeat screening scan. Often noted after an upper respiratory illness. We will be in touch to make sure you have no questions, and to schedule your 6-month scan.  Lung RADS 4 A: nodules with concerning findings, recommendation is most often for a follow up scan in 3 months or additional testing based on our provider's assessment of the scan. We will be in touch to make sure you have no questions and to schedule the recommended 3 month follow up scan.  Lung RADS 4 B:  indicates findings that are concerning. We will be in touch with you to schedule additional diagnostic testing based on our provider's  assessment of the scan.   

## 2019-08-17 ENCOUNTER — Encounter: Payer: Self-pay | Admitting: Family Medicine

## 2019-08-17 NOTE — Progress Notes (Signed)
I tried to call the patient with his scan results but there was no answer. I have left a message with the office contact information , and asked the patient to call the office for results. If we do not get a return call we will cal again.   Derrick Stokes, please go ahead and place an order for a 6 month follow up CT. Thanks so much.

## 2019-08-21 ENCOUNTER — Other Ambulatory Visit: Payer: Self-pay

## 2019-08-21 ENCOUNTER — Ambulatory Visit (INDEPENDENT_AMBULATORY_CARE_PROVIDER_SITE_OTHER): Payer: Medicare HMO | Admitting: Pharmacist

## 2019-08-21 DIAGNOSIS — F17210 Nicotine dependence, cigarettes, uncomplicated: Secondary | ICD-10-CM

## 2019-08-21 DIAGNOSIS — R69 Illness, unspecified: Secondary | ICD-10-CM | POA: Diagnosis not present

## 2019-08-21 NOTE — Progress Notes (Signed)
I have called the results to the patient. I explained that his scan was read as a Lung  RADS 3, nodules that are probably benign findings, short term follow up suggested: includes nodules with a low likelihood of becoming a clinically active cancer. This is based on the presence of what appears to be multiple prominent but nonenlarged right infrahilar lymph nodes which creates a mass-like appearance in the central aspect of the right lower lobe. This appears likely unchanged compared to prior CT the abdomen and pelvis 01/16/2012, strongly favored to be benign, however short-term follow-up in 6 months is recommended with repeat low-dose chest CT without contrast Radiology recommends a 6 month repeat LDCT follow up.   I explained that we will schedule a 6 month follow up and that we will call to schedule . He should plan on this taking place at the end of July.  He verbalized understanding and had no questions at completion of the call.   Langley Gauss, please fax results to PCP and schedule 6 month follow up CT. Thanks so much

## 2019-08-21 NOTE — Patient Instructions (Addendum)
It was a pleasure seeing you in clinic today [name]!  Today the plan is...  1. Call [place] [facility]   2. Use Nicotine Patch  Patch Schedule for >10 cigarettes daily  -Weeks 1-6: one 21 mg patch daily  -Weeks 7-8: one 14 mg patch daily  -Weeks 9-10: one 7 mg patch daily  Patch Schedule for <10 cigarettes daily  -Weeks 1 to 6: one nicotine patch (14 mg) daily. I will call and re-assess how you are doing at the end of 6 weeks to see how you are doing on 14 mg patch and if you are ready to decrease dose of patch.  -Weeks 7-8: one nicotine patch (7 mg) daily   3. Use Nicotine Lozenge  Lozenge dosing schedule  Weeks 1 to 6: 1 lozenge every 1 to 2 hours (maximum: 5 lozenges every 6 hours; 20 lozenges/day); to increase chances of quitting, use at least 9 lozenges/day during the first 6 weeks  Weeks 7 to 9: 1 lozenge every 2 to 4 hours (maximum: 5 lozenges every 6 hours; 20 lozenges/day)  Weeks 10 to 12: 1 lozenge every 4 to 8 hours (maximum: 5 lozenges every 6 hours; 20 lozenges/day)  4. Varenicline (Chantix)  -Initiated varenicline titration of 0.5 mg by mouth once daily with food x3 days, then 0.5 mg by mouth twice daily with food x4 days, then 1 mg by mouth twice daily with food thereafter.   Please call the PharmD clinic at [phone] if you have any questions that you would like to speak with a pharmacist about Derrick Stokes, Derrick Stokes).

## 2019-08-22 ENCOUNTER — Other Ambulatory Visit: Payer: Self-pay | Admitting: *Deleted

## 2019-08-22 DIAGNOSIS — F17210 Nicotine dependence, cigarettes, uncomplicated: Secondary | ICD-10-CM

## 2019-08-22 DIAGNOSIS — Z87891 Personal history of nicotine dependence: Secondary | ICD-10-CM

## 2019-09-01 ENCOUNTER — Telehealth: Payer: Self-pay | Admitting: Pharmacist

## 2019-09-01 NOTE — Telephone Encounter (Signed)
Called patient on 09/01/2019 at 2:00 PM and left HIPAA-compliant VM with instructions to call [facility] back   Plan to discuss smoking cessation  Will re-attempt in 2-3 weeks  Drexel Iha, PharmD PGY2 Ambulatory Care Pharmacy Resident

## 2019-09-11 ENCOUNTER — Ambulatory Visit: Payer: Medicare HMO | Admitting: Family Medicine

## 2019-09-12 ENCOUNTER — Encounter: Payer: Self-pay | Admitting: Family Medicine

## 2019-09-12 DIAGNOSIS — Z809 Family history of malignant neoplasm, unspecified: Secondary | ICD-10-CM | POA: Diagnosis not present

## 2019-09-12 DIAGNOSIS — I2510 Atherosclerotic heart disease of native coronary artery without angina pectoris: Secondary | ICD-10-CM | POA: Diagnosis not present

## 2019-09-12 DIAGNOSIS — R69 Illness, unspecified: Secondary | ICD-10-CM | POA: Diagnosis not present

## 2019-09-12 DIAGNOSIS — K219 Gastro-esophageal reflux disease without esophagitis: Secondary | ICD-10-CM | POA: Diagnosis not present

## 2019-09-12 DIAGNOSIS — Z7902 Long term (current) use of antithrombotics/antiplatelets: Secondary | ICD-10-CM | POA: Diagnosis not present

## 2019-09-12 DIAGNOSIS — I10 Essential (primary) hypertension: Secondary | ICD-10-CM | POA: Diagnosis not present

## 2019-09-12 DIAGNOSIS — E785 Hyperlipidemia, unspecified: Secondary | ICD-10-CM | POA: Diagnosis not present

## 2019-09-12 DIAGNOSIS — Z008 Encounter for other general examination: Secondary | ICD-10-CM | POA: Diagnosis not present

## 2019-09-12 DIAGNOSIS — Z7982 Long term (current) use of aspirin: Secondary | ICD-10-CM | POA: Diagnosis not present

## 2019-09-12 DIAGNOSIS — G8929 Other chronic pain: Secondary | ICD-10-CM | POA: Diagnosis not present

## 2019-09-12 DIAGNOSIS — G629 Polyneuropathy, unspecified: Secondary | ICD-10-CM | POA: Diagnosis not present

## 2019-09-15 ENCOUNTER — Other Ambulatory Visit: Payer: Self-pay

## 2019-09-15 ENCOUNTER — Telehealth: Payer: Self-pay | Admitting: Pharmacist

## 2019-09-15 NOTE — Telephone Encounter (Signed)
Called patient on 09/15/2019 at 1:26 PM and left HIPAA-compliant VM with instructions to call [facility] back   Plan to discuss smoking cessation  Drexel Iha, PharmD PGY2 Ambulatory Care Pharmacy Resident

## 2019-09-18 ENCOUNTER — Other Ambulatory Visit: Payer: Self-pay

## 2019-09-18 ENCOUNTER — Ambulatory Visit (INDEPENDENT_AMBULATORY_CARE_PROVIDER_SITE_OTHER): Payer: Medicare HMO | Admitting: Family Medicine

## 2019-09-18 ENCOUNTER — Encounter: Payer: Self-pay | Admitting: Family Medicine

## 2019-09-18 VITALS — BP 128/80 | HR 74 | Temp 95.5°F | Resp 20 | Ht 68.0 in | Wt 181.2 lb

## 2019-09-18 DIAGNOSIS — Z0000 Encounter for general adult medical examination without abnormal findings: Secondary | ICD-10-CM | POA: Diagnosis not present

## 2019-09-18 DIAGNOSIS — M545 Low back pain, unspecified: Secondary | ICD-10-CM

## 2019-09-18 DIAGNOSIS — I25119 Atherosclerotic heart disease of native coronary artery with unspecified angina pectoris: Secondary | ICD-10-CM | POA: Insufficient documentation

## 2019-09-18 DIAGNOSIS — I739 Peripheral vascular disease, unspecified: Secondary | ICD-10-CM | POA: Diagnosis not present

## 2019-09-18 DIAGNOSIS — E785 Hyperlipidemia, unspecified: Secondary | ICD-10-CM

## 2019-09-18 MED ORDER — TIZANIDINE HCL 4 MG PO TABS: 2.0000 mg | ORAL_TABLET | Freq: Three times a day (TID) | ORAL | 0 refills | Status: DC | PRN

## 2019-09-18 NOTE — Progress Notes (Signed)
HPI:  [name] is a 69 y.o.male here today with his wife for his routine physical examination.  Last CPE: 09/14/18. He lives with his wife, son, daughter, and granddaughter.  Regular exercise 3 or more times per week: Not regularly but he is active around the house. Following a healthy diet: Not consistently.   Chronic medical problems: CAD, hyperlipidemia, tobacco use, GERD, anxiety, back pain, hearing loss,peripheral neuropathy, and B12 deficiency ambulance home.  Hx of LE pain. On 08/17/18 ABI was normal. He is not longer having LE pain.  Echo 04/2015 showed LVEF 50-55% and  grade I diastolic dysfunction.. CAD: He is on Metoprolol 25 mg bid,plavix 75 mg daily,and Aspirin.  Lab Results  Component Value Date   CREATININE 0.77 03/08/2019   BUN 14 03/08/2019   NA 138 03/08/2019   K 4.6 03/08/2019   CL 99 03/08/2019   CO2 23 03/08/2019    He follows with cardiologist, Dr. Burt Knack. Last appt with cardiologist on 06/27/19.Next appt in 12/2019.  Recently evaluated by pulmonologist, 6 months follow-up recommended. Tobacco use: He is still smoking, Chantix was prescribed, he has not started yet.  Immunization History  Administered Date(s) Administered  . Influenza, High Dose Seasonal PF 09/14/2018, 04/25/2019  . Influenza,inj,Quad PF,6+ Mos 04/17/2014, 05/06/2015, 06/08/2016  . Influenza,inj,Quad PF,6-35 Mos 04/25/2019  . Moderna SARS-COVID-2 Vaccination 08/15/2019, 09/12/2019  . Pneumococcal Conjugate-13 09/14/2018  . Pneumococcal Polysaccharide-23 06/08/2016  . Tdap 05/01/2014  . Tetanus 04/17/2014  . Zoster 05/01/2014    -Hep C screening: 09/14/18 NR. -AAA screening: Negative on 09/27/18.  Last colon cancer screening: 03/06/11. Last prostate ca screening: 09/14/18 normal at 0.9. Nocturia x 0. He has not noted increased in urinary frequency.  -Denies high alcohol intake or Hx of illicit drug use.  -Concerns and/or follow up today: Lower back  pain. Yesterday he was squatting fixing the water heater, when he got up sudden lower back pain started.Negative for saddle anesthesia, changes in bowel/bowel function,or radiation of pain. He has history of back pain, he has had similar episodes in the past. He has not taking OTC medication for pain.  HLD:He is on Atorvastatin 80 mg daily.  Lab Results  Component Value Date   CHOL 133 03/08/2019   HDL 36 (L) 03/08/2019   LDLCALC 68 03/08/2019   LDLDIRECT 68.0 11/19/2014   TRIG 146 03/08/2019   CHOLHDL 3.7 03/08/2019    Review of Systems  Constitutional: Negative for activity change, appetite change, fatigue and fever.  HENT: Negative for dental problem, nosebleeds and sore throat.   Eyes: Negative for redness and visual disturbance.  Respiratory: Negative for cough, shortness of breath and wheezing.   Cardiovascular: Negative for chest pain, palpitations and leg swelling.  Gastrointestinal: Negative for abdominal pain, blood in stool, nausea and vomiting.  Endocrine: Negative for cold intolerance, heat intolerance, polydipsia, polyphagia and polyuria.  Genitourinary: Negative for decreased urine volume, dysuria, genital sores, hematuria and testicular pain.  Musculoskeletal: Positive for arthralgias. Negative for gait problem.  Skin: Negative for color change and rash.  Allergic/Immunologic: Positive for environmental allergies.  Neurological: Negative for syncope, weakness and headaches.  Hematological: Negative for adenopathy. Does not bruise/bleed easily.  Psychiatric/Behavioral: Negative for confusion and sleep disturbance. The patient is nervous/anxious.   All other systems reviewed and are negative.  Current Outpatient Medications on File Prior to Visit  Medication Sig Dispense Refill  . aspirin EC 81 MG tablet Take 81 mg by mouth at bedtime.     [name]  atorvastatin (LIPITOR) 80 MG tablet TAKE 1 TABLET (80 MG TOTAL) BY MOUTH DAILY AT 6 PM. 90 tablet 3  . clopidogrel (PLAVIX)  75 MG tablet TAKE 1 TABLET BY MOUTH EVERY DAY WITH BREAKFAST 90 tablet 2  . metoprolol tartrate (LOPRESSOR) 25 MG tablet Take 1 tablet (25 mg total) by mouth 2 (two) times daily. 60 tablet 6  . nicotine (NICODERM CQ - DOSED IN MG/24 HOURS) 21 mg/24hr patch Place 21 mg onto the skin daily. (getting through [facility] Quitline)    . nicotine polacrilex (NICOTINE MINI) 4 MG lozenge Take 4 mg by mouth as needed for smoking cessation. (getting through [facility] Quitline)    . nitroGLYCERIN (NITROSTAT) 0.4 MG SL tablet PLACE 1 TABLET UNDER THE TONGUE EVERY 5 MINUTES X 3 DOSES AS NEEDED FOR CHEST PAIN. 25 tablet 3  . Omega-3 Fatty Acids (FISH OIL) 1000 MG CPDR Take 1 capsule by mouth every morning.    . pantoprazole (PROTONIX) 40 MG tablet TAKE 1 TABLET BY MOUTH EVERY DAY 90 tablet 3  . varenicline (CHANTIX STARTING MONTH PAK) 0.5 MG X 11 & 1 MG X 42 tablet Take by mouth 2 (two) times daily. Take one 0.5 mg tablet by mouth once daily for 3 days, then increase to one 0.5 mg tablet twice daily for 4 days, then increase to one 1 mg tablet twice daily. (getting through [facility] Quitline)    . vitamin B-12 (CYANOCOBALAMIN) 500 MCG tablet Take 500 mcg by mouth daily.     No current facility-administered medications on file prior to visit.    Past Medical History:  Diagnosis Date  . Arthritis   . B12 deficiency    Borderline  . CAD (coronary artery disease)    a. s/p CABG 01/2011;  b. ETT Myoview 3/14:  Low risk;  c. 09/2014 Cath/PCI: LM nl, LAD 100p, LCX 1m/OM2 60-70p (2.75x28 Synergy DES), RCA dom, 80m (atherectomy, 3.5x24 Synergy DES), PDA nl, LIMA->LAD nl, VG->Diag nl, VG->OM 100, VG->RCA 100.  [name] Hiatal hernia   . History of echocardiogram    Echo 10/16: EF 50-55%, no RWMA, Gr 1 DD, mild MR, mild TR  . HLD (hyperlipidemia)   . Kidney stones   . Leg pain    ABIs 4/14:  R 1.2, L 1.2, TBIs normal  . Peripheral neuropathy    From trauma  . Reactive airway disease 03/18/2018  . Tobacco abuse     Past Surgical History:   Procedure Laterality Date  . COLONOSCOPY     Neg  . CORONARY ARTERY BYPASS GRAFT    . EPIDURAL STEROIDS    . INTRAVASCULAR PRESSURE WIRE/FFR STUDY N/A 03/17/2018   Procedure: INTRAVASCULAR PRESSURE WIRE/FFR STUDY;  Surgeon: Lorretta Harp, MD;  Location: [place] [facility];  Service: Cardiovascular;  Laterality: N/A;  . LEFT HEART CATH AND CORONARY ANGIOGRAPHY N/A 03/17/2018   Procedure: LEFT HEART CATH AND CORONARY ANGIOGRAPHY;  Surgeon: Lorretta Harp, MD;  Location: [place] [facility];  Service: Cardiovascular;  Laterality: N/A;  . LEFT HEART CATHETERIZATION WITH CORONARY/GRAFT ANGIOGRAM N/A 09/18/2014   Procedure: LEFT HEART CATHETERIZATION WITH [name];  Surgeon: [name], MD;  Location: [facility] CATH LAB;  Service: Cardiovascular;  Laterality: N/A;  . PERCUTANEOUS CORONARY ROTOBLATOR INTERVENTION (PCI-R) N/A 09/20/2014   Procedure: PERCUTANEOUS CORONARY ROTOBLATOR INTERVENTION (PCI-R);  Surgeon: [name], MD;  Location: [facility] CATH LAB;  Service: Cardiovascular;  Laterality: N/A;    Allergies  Allergen Reactions  . Iodine Nausea And Vomiting  Turns white in face and sweaty  . Iohexol Other (See Comments)    May have caused nausea and vomiting several years ago   . Shrimp [Shellfish Allergy] Nausea And Vomiting    Turns white in face and sweaty  . Isosorbide Dinitrate Other (See Comments)    headache    Family History  Problem Relation Age of Onset  . Diabetes Father   . Heart attack Father 53  . Heart disease Father        CAD  . Cancer Mother     Social History   Socioeconomic History  . Marital status: Married    Spouse name: Not on file  . Number of children: Not on file  . Years of education: Not on file  . Highest education level: Not on file  Occupational History  . Occupation: Diasability  Tobacco Use  . Smoking status: Current Every Day Smoker    Packs/day: 1.00    Years: 51.00    Pack years: 51.00    Types: Cigarettes     Start date: 33  . Smokeless tobacco: Former Systems developer  . Tobacco comment: Quit Date 09/04/2019  Substance and Sexual Activity  . Alcohol use: Yes    Alcohol/week: 4.0 standard drinks    Types: 4 Cans of beer per week    Comment: occ  . Drug use: No  . Sexual activity: Yes  Other Topics Concern  . Not on file  Social History Narrative   Married   Gets regular exercise - walks 1/4 mile per day 5 times a week without symptoms   Social Determinants of Health   Financial Resource Strain:   . Difficulty of Paying Living Expenses: Not on file  Food Insecurity:   . Worried About Charity fundraiser in the Last Year: Not on file  . Ran Out of Food in the Last Year: Not on file  Transportation Needs:   . Lack of Transportation (Medical): Not on file  . Lack of Transportation (Non-Medical): Not on file  Physical Activity:   . Days of Exercise per Week: Not on file  . Minutes of Exercise per Session: Not on file  Stress:   . Feeling of Stress : Not on file  Social Connections:   . Frequency of Communication with Friends and Family: Not on file  . Frequency of Social Gatherings with Friends and Family: Not on file  . Attends Religious Services: Not on file  . Active Member of Clubs or Organizations: Not on file  . Attends Archivist Meetings: Not on file  . Marital Status: Not on file   Vitals:   09/18/19 0651  BP: 128/80  Pulse: 74  Resp: 20  Temp: (!) 95.5 F (35.3 C)  SpO2: 98%   Body mass index is 27.56 kg/m.   Wt Readings from Last 3 Encounters:  09/18/19 181 lb 4 oz (82.2 kg)  08/16/19 180 lb 6.4 oz (81.8 kg)  06/27/19 179 lb 3.2 oz (81.3 kg)    Physical Exam  Nursing note and vitals reviewed. Constitutional: He is oriented to person, place, and time. He appears well-developed. No distress.  HENT:  Head: Normocephalic and atraumatic.  Right Ear: Tympanic membrane, external ear and ear canal normal. Decreased hearing is noted.  Left Ear: External ear  normal. Decreased hearing is noted.  Mouth/Throat: Oropharynx is clear and moist and mucous membranes are normal.  Excess cerumen left ear,TM seen partially.  Eyes: Pupils are equal, round, and  reactive to light. Conjunctivae and EOM are normal.  Neck: No tracheal deviation present. No thyromegaly present.  Cardiovascular: Normal rate and regular rhythm.  No murmur heard. Pulses:      Dorsalis pedis pulses are 2+ on the right side and 2+ on the left side.  Respiratory: Effort normal and breath sounds normal. No respiratory distress.  GI: Soft. He exhibits no mass. There is no hepatomegaly. There is no abdominal tenderness.  Genitourinary:    Genitourinary Comments: No concerns.   Musculoskeletal:        General: No edema.     Cervical back: Normal range of motion.     Lumbar back: Tenderness present. No bony tenderness. Decreased range of motion.       Back:     Comments: No signs of synovitis. Antalgic gait.  Lymphadenopathy:    He has no cervical adenopathy.  Neurological: He is alert and oriented to person, place, and time. He has normal strength. No cranial nerve deficit or sensory deficit.  Reflex Scores:      Bicep reflexes are 2+ on the right side and 2+ on the left side.      Patellar reflexes are 2+ on the right side and 2+ on the left side. Skin: Skin is warm. No erythema.  Psychiatric: He has a normal mood and affect.  Well groomed, good eye contact.    ASSESSMENT AND PLAN:  Mr. Tosh was seen today for annual exam.  Diagnoses and all orders for this visit:  Orders Placed This Encounter  Procedures  . Comprehensive metabolic panel  . Lipid panel    Routine general medical examination at a health care facility We discussed the importance of regular physical activity and healthy diet for prevention of chronic illness and/or complications. Preventive guidelines reviewed. Vaccination up to date. Fall precautions. Next CPE in a year.  Bilateral low back pain  without sciatica, unspecified chronicity Acute exacerbation on a chronic problem. Acetaminophen 500 mg 3-4 times per day and Icy hot patch may help. Some side effects of Zanaflex discussed. Avoid activities that could aggravate problem.  -     tiZANidine (ZANAFLEX) 4 MG tablet; Take 0.5-1 tablets (2-4 mg total) by mouth every 8 (eight) hours as needed for up to 15 days for muscle spasms.  Hyperlipidemia Problem has been well controlled. Continue atorvastatin 80 mg daily. We will plan on checking fasting lipid panel in 02/2020.   Coronary artery disease involving native coronary artery of native heart with angina pectoris ([place]) Currently asymptomatic. Continue atorvastatin, Plavix, and metoprolol. Looking cessation encouraged, we discussed some benefits. Following with cardiologist.  Claudication [facility]) He is not having symptoms at this time. Strongly recommend smoking cessation. Continue Aspirin and Atorvastatin.   He is following with cardiologist around 12/2019 and pulmonologist around 01/2020, so I think I can see him for his CPE next year around 01/2020, before if needed. He has a BP monitor, recommend monitoring BP and HR regularly.  Future labs to be done in 02/2020.   Return in about 16 months (around 01/29/2021) for Fasting labs in 01/2020.[name]    Delorese Sellin G. [name], MD  [facility]. [place] office.

## 2019-09-18 NOTE — Assessment & Plan Note (Signed)
He is not having symptoms at this time. Strongly recommend smoking cessation. Continue Aspirin and Atorvastatin.

## 2019-09-18 NOTE — Assessment & Plan Note (Deleted)
Currently asymptomatic. Continue atorvastatin, Plavix, and metoprolol. Looking cessation encouraged, we discussed some benefits. Following with cardiologist.

## 2019-09-18 NOTE — Assessment & Plan Note (Signed)
Currently asymptomatic. Continue atorvastatin, Plavix, and metoprolol. Looking cessation encouraged, we discussed some benefits. Following with cardiologist.

## 2019-09-18 NOTE — Assessment & Plan Note (Signed)
Problem has been well controlled. Continue atorvastatin 80 mg daily. We will plan on checking fasting lipid panel in 02/2020.

## 2019-09-18 NOTE — Patient Instructions (Addendum)
A few things to remember from today's visit:   Routine general medical examination at a health care facility  Claudication [facility])  Coronary artery disease involving native coronary artery of native heart with angina pectoris ([place])  Bilateral low back pain without sciatica, unspecified chronicity - Plan: tiZANidine (ZANAFLEX) 4 MG tablet  Icy hot patch on area of back pain may also help.  At least 150 minutes of moderate exercise per week, daily brisk walking for 15-30 min is a good exercise option. Healthy diet low in saturated (animal) fats and sweets and consisting of fresh fruits and vegetables, lean meats such as fish and white chicken and whole grains.  - Vaccines:  Tdap vaccine every 10 years.  Shingles vaccine recommended at age 69, could be given after 67 years of age but not sure about insurance coverage.  Pneumonia vaccines:  Pneumovax at 31.  -Screening recommendations for low/normal risk males:  Screening for diabetes at age 70 and every 3 years. Earlier screening if cardiovascular risk factors.   Lipid screening at 35 and every 3 years. Screening starts in younger males with cardiovascular risk factors.  Colon cancer screening at age 30 and until age 41.  Prostate cancer screening: some controversy, starts usually at 52: Rectal exam and PSA.  Aortic Abdominal Aneurism once between 12 and 30 years old if ever smoker.  Also recommended:  1. Dental visit- Brush and floss your teeth twice daily; visit your dentist twice a year. 2. Eye doctor- Get an eye exam at least every 2 years. 3. Helmet use- Always wear a helmet when riding a bicycle, motorcycle, rollerblading or skateboarding. 4. Safe sex- If you may be exposed to sexually transmitted infections, use a condom. 5. Seat belts- Seat belts can save your live; always wear one. 6. Smoke/Carbon Monoxide detectors- These detectors need to be installed on the appropriate level of your home. Replace batteries at least  once a year. 7. Skin cancer- When out in the sun please cover up and use sunscreen 15 SPF or higher. 8. Violence- If anyone is threatening or hurting you, please tell your healthcare provider.  9. Drink alcohol in moderation- Limit alcohol intake to one drink or less per day. Never drink and drive.  Please be sure medication list is accurate. If a new problem present, please set up appointment sooner than planned today.

## 2019-09-29 ENCOUNTER — Telehealth: Payer: Self-pay | Admitting: Pharmacist

## 2019-09-29 NOTE — Telephone Encounter (Signed)
Called patient on 09/29/2019 at 10:26 AM   Patient is unable to talk at the moment. Will contact patietn on Monday (10/02/2019).  Thank you for involving pharmacy to assist in providing this patient's care.   Drexel Iha, PharmD PGY2 Ambulatory Care Pharmacy Resident

## 2019-10-02 ENCOUNTER — Telehealth: Payer: Self-pay | Admitting: Pharmacist

## 2019-10-02 NOTE — Telephone Encounter (Signed)
Called patient on 10/02/2019 at 2:26 PM and left HIPAA-compliant VM with instructions to call [facility] back   Unsuccessful attempt #3  Will await for patient to return call at this time to discuss smoking cessation.  Thank you for involving pharmacy to assist in providing this patient's care.   Drexel Iha, PharmD PGY2 Ambulatory Care Pharmacy Resident

## 2019-10-10 ENCOUNTER — Other Ambulatory Visit: Payer: Self-pay | Admitting: Family Medicine

## 2019-10-10 DIAGNOSIS — M545 Low back pain, unspecified: Secondary | ICD-10-CM

## 2019-10-27 ENCOUNTER — Other Ambulatory Visit: Payer: Self-pay | Admitting: Family Medicine

## 2019-10-27 DIAGNOSIS — M545 Low back pain, unspecified: Secondary | ICD-10-CM

## 2019-11-30 ENCOUNTER — Other Ambulatory Visit: Payer: Self-pay | Admitting: Cardiovascular Disease

## 2019-12-31 ENCOUNTER — Other Ambulatory Visit: Payer: Self-pay | Admitting: Cardiovascular Disease

## 2020-01-18 ENCOUNTER — Other Ambulatory Visit: Payer: Self-pay

## 2020-01-18 ENCOUNTER — Ambulatory Visit: Payer: Medicare HMO | Admitting: Cardiovascular Disease

## 2020-01-18 ENCOUNTER — Encounter: Payer: Self-pay | Admitting: Cardiovascular Disease

## 2020-01-18 VITALS — BP 112/60 | HR 67 | Wt 186.0 lb

## 2020-01-18 DIAGNOSIS — E785 Hyperlipidemia, unspecified: Secondary | ICD-10-CM | POA: Diagnosis not present

## 2020-01-18 DIAGNOSIS — I10 Essential (primary) hypertension: Secondary | ICD-10-CM | POA: Diagnosis not present

## 2020-01-18 DIAGNOSIS — I25119 Atherosclerotic heart disease of native coronary artery with unspecified angina pectoris: Secondary | ICD-10-CM | POA: Diagnosis not present

## 2020-01-18 MED ORDER — METOPROLOL TARTRATE 25 MG PO TABS: 25.0000 mg | ORAL_TABLET | Freq: Two times a day (BID) | ORAL | 3 refills | Status: DC

## 2020-01-18 NOTE — Progress Notes (Signed)
Cardiology Office Note:    Date:  01/18/2020   ID:  Derrick Stokes, DOB 29-Jan-1951, MRN [id number]  PCP:  [name], [name], MD  [facility] Cardiologist:  Sherren Mocha, MD  [place] Electrophysiologist:  None   Referring MD: [name], [name], MD   Chief Complaint  Patient presents with  . Coronary Artery Disease    History of Present Illness:    Derrick Stokes is a 69 y.o. male with a hx of:  Coronary artery diseases/pCABG ? [place] 2016: S-OM, S-RCA 100; L-LAD, S-Dx ok; PCI: DES to LCx (leading into OM2); DES to RCA ? Chest pain >> Cath 8/19: 2/4 grafts patent; LCx and RCA stents patent  Hyperlipidemia  Tobacco use  Peripheral neuropathy ? ABIs 07/2018: normal  The patient is here with his wife today.  He is doing pretty well from a cardiac perspective.  He has not had any chest discomfort, shortness of breath, edema, heart palpitations, orthopnea, or PND.  He has kept a chronic cough and relates this to tobacco use.  He is still dealing with low back pain and has limitation from this.  He cannot walk very far without having something to lean on.  Past Medical History:  Diagnosis Date  . Arthritis   . B12 deficiency    Borderline  . CAD (coronary artery disease)    a. s/p CABG 01/2011;  b. ETT Myoview 3/14:  Low risk;  c. 09/2014 Cath/PCI: LM nl, LAD 100p, LCX 71m/OM2 60-70p (2.75x28 Synergy DES), RCA dom, 46m (atherectomy, 3.5x24 Synergy DES), PDA nl, LIMA->LAD nl, VG->Diag nl, VG->OM 100, VG->RCA 100.  [name] Hiatal hernia   . History of echocardiogram    Echo 10/16: EF 50-55%, no RWMA, Gr 1 DD, mild MR, mild TR  . HLD (hyperlipidemia)   . Kidney stones   . Leg pain    ABIs 4/14:  R 1.2, L 1.2, TBIs normal  . Peripheral neuropathy    From trauma  . Reactive airway disease 03/18/2018  . Tobacco abuse     Past Surgical History:  Procedure Laterality Date  . COLONOSCOPY     Neg  . CORONARY ARTERY BYPASS GRAFT    . EPIDURAL STEROIDS    . INTRAVASCULAR  PRESSURE WIRE/FFR STUDY N/A 03/17/2018   Procedure: INTRAVASCULAR PRESSURE WIRE/FFR STUDY;  Surgeon: Lorretta Harp, MD;  Location: [facility];  Service: Cardiovascular;  Laterality: N/A;  . LEFT HEART CATH AND CORONARY ANGIOGRAPHY N/A 03/17/2018   Procedure: LEFT HEART CATH AND CORONARY ANGIOGRAPHY;  Surgeon: Lorretta Harp, MD;  Location: [facility];  Service: Cardiovascular;  Laterality: N/A;  . LEFT HEART CATHETERIZATION WITH CORONARY/GRAFT ANGIOGRAM N/A 09/18/2014   Procedure: LEFT HEART CATHETERIZATION WITH [name];  Surgeon: [name], MD;  Location: [facility] CATH LAB;  Service: Cardiovascular;  Laterality: N/A;  . PERCUTANEOUS CORONARY ROTOBLATOR INTERVENTION (PCI-R) N/A 09/20/2014   Procedure: PERCUTANEOUS CORONARY ROTOBLATOR INTERVENTION (PCI-R);  Surgeon: [name], MD;  Location: [facility] CATH LAB;  Service: Cardiovascular;  Laterality: N/A;    Current Medications: Current Meds  Medication Sig  . aspirin EC 81 MG tablet Take 81 mg by mouth at bedtime.   [name] atorvastatin (LIPITOR) 80 MG tablet TAKE 1 TABLET (80 MG TOTAL) BY MOUTH DAILY AT 6 PM.  . clopidogrel (PLAVIX) 75 MG tablet TAKE 1 TABLET BY MOUTH EVERY DAY WITH BREAKFAST  . metoprolol tartrate (LOPRESSOR) 25 MG tablet Take 1 tablet (25 mg total) by mouth  2 (two) times daily.  . nitroGLYCERIN (NITROSTAT) 0.4 MG SL tablet PLACE 1 TABLET UNDER THE TONGUE EVERY 5 MINUTES X 3 DOSES AS NEEDED FOR CHEST PAIN.  [name] Omega-3 Fatty Acids (FISH OIL) 1000 MG CPDR Take 1 capsule by mouth every morning.  . pantoprazole (PROTONIX) 40 MG tablet TAKE 1 TABLET BY MOUTH EVERY DAY  . tiZANidine (ZANAFLEX) 4 MG tablet Take 0.5-1 tablets (2-4 mg total) by mouth 2 (two) times daily as needed for muscle spasms.  . vitamin B-12 (CYANOCOBALAMIN) 500 MCG tablet Take 500 mcg by mouth daily.  . [DISCONTINUED] metoprolol tartrate (LOPRESSOR) 25 MG tablet Take 1 tablet (25 mg total) by mouth 2 (two) times daily.      Allergies:   Iodine, Iohexol, Shrimp [shellfish allergy], and Isosorbide dinitrate   Social History   Socioeconomic History  . Marital status: Married    Spouse name: Not on file  . Number of children: Not on file  . Years of education: Not on file  . Highest education level: Not on file  Occupational History  . Occupation: Diasability  Tobacco Use  . Smoking status: Current Every Day Smoker    Packs/day: 1.00    Years: 51.00    Pack years: 51.00    Types: Cigarettes    Start date: 81  . Smokeless tobacco: Former Systems developer  . Tobacco comment: Quit Date 09/04/2019  Vaping Use  . Vaping Use: Never used  Substance and Sexual Activity  . Alcohol use: Yes    Alcohol/week: 4.0 standard drinks    Types: 4 Cans of beer per week    Comment: occ  . Drug use: No  . Sexual activity: Yes  Other Topics Concern  . Not on file  Social History Narrative   Married   Gets regular exercise - walks 1/4 mile per day 5 times a week without symptoms   Social Determinants of Radio broadcast assistant Strain:   . Difficulty of Paying Living Expenses:   Food Insecurity:   . Worried About Charity fundraiser in the Last Year:   . Arboriculturist in the Last Year:   Transportation Needs:   . Film/video editor (Medical):   [name] Lack of Transportation (Non-Medical):   Physical Activity:   . Days of Exercise per Week:   . Minutes of Exercise per Session:   Stress:   . Feeling of Stress :   Social Connections:   . Frequency of Communication with Friends and Family:   . Frequency of Social Gatherings with Friends and Family:   . Attends Religious Services:   . Active Member of Clubs or Organizations:   . Attends Archivist Meetings:   [name] Marital Status:      Family History: The patient's family history includes Cancer in his mother; Diabetes in his father; Heart attack (age of onset: 12) in his father; Heart disease in his father.  ROS:   Please see the history of present  illness.    All other systems reviewed and are negative.  EKGs/Labs/Other Studies Reviewed:    The following studies were reviewed today: Cardiac Catheterization 03-17-2018: Conclusion    Previously placed Prox RCA to Mid RCA stent (unknown type) is widely patent.  Ost LAD lesion is 100% stenosed.  Origin to Prox Graft lesion is 100% stenosed.  Origin lesion is 100% stenosed.  Ost Cx to Mid Cx lesion is 50% stenosed.  The left ventricular systolic function is normal.  LV end diastolic pressure is normal.  The left ventricular ejection fraction is 55-65% by visual estimate.   KACETON VIEAU is a 69 y.o. male    [id number] LOCATION:  FACILITY: [facility]  PHYSICIAN: Quay Burow, M.D. 02/10/1951   DATE OF PROCEDURE:  03/17/2018  DATE OF DISCHARGE:     CARDIAC CATHETERIZATION / FFR LCX    History obtained from chart review.Kaleel Schmieder Allredis a 69 y.o.malewith a history of coronary artery disease s/p CABGin 2012,s/p PCI with DES to the LCx and OM2 and DES to the RCA in 2016,hyperlipidemia, tobacco abuse who presented to the office for evaluation ofchest pain.  He has been having chest pain for last 2 weeks.  He was admitted from the office with unstable angina and placed on IV heparin and nitroglycerin.  His enzymes were negative.  He presents now for diagnostic coronary angiography.   PROCEDURE DESCRIPTION:   The patient was brought to the second floor [facility] cath lab in the postabsorptive state. He was not premedicated . His right groin was prepped and shaved in usual sterile fashion. Xylocaine 1% was used  for local anesthesia. A 6 French sheath was inserted into the right common femoral artery using standard Seldinger technique.  5 French right and left Judkins diagnostic catheters along with a 5 French pigtail catheter used for selective coronary angiography, selective vein graft and IMA graft angiography and left ventriculography.   Omnipaque dye was used for the entirety of the case.  Retrograde aorta, left ventricular and pullback pressures were recorded.  The patient received a total of 8000 units of heparin with an ACT of greater than 300.  He was already on aspirin and Plavix.  Using an XB 3.5 cm 6 [place] guide catheter along with a 0.14 pro-water guidewire and and ACIST FFR device I performed FFR of the mid AV groove circumflex within the previously stented segment for a moderate area of "in-stent restenosis.  Adenosine was infused and the lowest FFR noted was 0.88 suggesting that the lesion was not physiologically significant.  Guidewire and catheter were removed and the sheath was secured.   IMPRESSION: Mr. Schueler has no culprit lesion.  His vein graft to diagonal branch and LIMA to the LAD were widely patent.  Previously demonstrated occlusion of the RCA and obtuse marginal branch vein grafts.  The stent to the RCA is widely patent.  The stent to the circumflex is widely patent with a small area of in-stent restenosis of at most 40 to 50%.  FFR of this area was only 0.88 suggesting that it was not physiologically significant.  His LV function was normal.  There is nothing in his coronary anatomy to suggest that he has coronary insufficiency attributed to his chest pain.  The sheath will be removed once ACT falls below 170 and pressure held.  The patient left the lab in stable condition.  Coronary Findings  Diagnostic Dominance: Right Left Anterior Descending  Ost LAD lesion is 100% stenosed.  Left Circumflex  Ost Cx to Mid Cx lesion is 50% stenosed. The lesion was previously treated.  Right Coronary Artery  Previously placed Prox RCA to Mid RCA stent (unknown type) is widely patent.  Graft To Dist RCA  Origin to Prox Graft lesion is 100% stenosed.  LIMA Graft To Mid LAD  Graft To 1st Diag  Graft To Dist Cx  Origin lesion is 100% stenosed.   Coronary Diagrams  Diagnostic Dominance: Right    EKG:  EKG is  ordered today.  The ekg ordered today demonstrates NSR 67 bpm, within normal limits  Recent Labs: 03/08/2019: ALT 44; BUN 14; Creatinine, Ser 0.77; Potassium 4.6; Sodium 138  Recent Lipid Panel    Component Value Date/Time   CHOL 133 03/08/2019 0905   TRIG 146 03/08/2019 0905   HDL 36 (L) 03/08/2019 0905   CHOLHDL 3.7 03/08/2019 0905   CHOLHDL 3.0 03/17/2018 0003   VLDL 19 03/17/2018 0003   LDLCALC 68 03/08/2019 0905   LDLDIRECT 68.0 11/19/2014 0737    Physical Exam:    VS:  BP 112/60   Pulse 67   Wt 186 lb (84.4 kg)   SpO2 99%   BMI 28.28 kg/m     Wt Readings from Last 3 Encounters:  01/18/20 186 lb (84.4 kg)  09/18/19 181 lb 4 oz (82.2 kg)  08/16/19 180 lb 6.4 oz (81.8 kg)     GEN:  Well nourished, well developed in no acute distress HEENT: Normal NECK: No JVD; No carotid bruits LYMPHATICS: No lymphadenopathy CARDIAC: RRR, no murmurs, rubs, gallops RESPIRATORY:  Clear to auscultation without rales, wheezing or rhonchi  ABDOMEN: Soft, non-tender, non-distended MUSCULOSKELETAL:  No edema; No deformity  SKIN: Warm and dry NEUROLOGIC:  Alert and oriented x 3 PSYCHIATRIC:  Normal affect   ASSESSMENT:    1. Essential hypertension   2. Hyperlipidemia, unspecified hyperlipidemia type   3. Coronary artery disease involving native coronary artery of native heart with angina pectoris ([place])    PLAN:    In order of problems listed above:  1. Blood pressure is well controlled.  He continues on metoprolol. 2. Lipids reviewed with the cholesterol 133, HDL 36, LDL 68.  Treated with atorvastatin 80 mg daily.  Lifestyle modification discussed. 3. Continue on dual antiplatelet therapy long-term with degenerated vein graft disease and multiple PCI procedures in the past.  No angina currently on metoprolol.  Continue high intensity statin drug.  Lengthy discussion today about the importance of complete tobacco cessation.  He is averaging about 3/4 pack of cigarettes daily.  We  discussed a variety of methods including nicotine replacement or pharmacotherapy with Chantix.  He thinks his best chance is to quit cold [place] which she has done twice in the past for a period of 6 months each time.  I had a long talk with him about the adverse effects of cigarette use on his health.   Medication Adjustments/Labs and Tests Ordered: Current medicines are reviewed at length with the patient today.  Concerns regarding medicines are outlined above.  Orders Placed This Encounter  Procedures  . EKG 12-Lead   Meds ordered this encounter  Medications  . metoprolol tartrate (LOPRESSOR) 25 MG tablet    Sig: Take 1 tablet (25 mg total) by mouth 2 (two) times daily.    Dispense:  180 tablet    Refill:  3    Patient Instructions  Medication Instructions:  Your provider recommends that you continue on your current medications as directed. Please refer to the Current Medication list given to you today.   *If you need a refill on your cardiac medications before your next appointment, please call your pharmacy*  Follow-Up: At [facility], you and your health needs are our priority.  As part of our continuing mission to provide you with exceptional heart care, we have created designated Provider Care Teams.  These Care Teams include your primary Cardiologist (physician) and Advanced Practice Providers (APPs -  Physician Assistants and Nurse Practitioners) who all  work together to provide you with the care you need, when you need it. Your next appointment:   12 month(s) The format for your next appointment:   In Person Provider:   You may see Sherren Mocha, MD or one of the following Advanced Practice Providers on your designated Care Team:    Richardson Dopp, PA-C  Robbie Lis, [place]      Signed, Sherren Mocha, MD  01/18/2020 4:07 PM    [place]

## 2020-01-18 NOTE — Patient Instructions (Signed)

## 2020-02-05 ENCOUNTER — Encounter: Payer: Self-pay | Admitting: Family Medicine

## 2020-02-05 ENCOUNTER — Ambulatory Visit (INDEPENDENT_AMBULATORY_CARE_PROVIDER_SITE_OTHER): Payer: Medicare HMO | Admitting: Family Medicine

## 2020-02-05 ENCOUNTER — Other Ambulatory Visit: Payer: Self-pay

## 2020-02-05 VITALS — BP 118/84 | HR 60 | Temp 97.7°F | Ht 68.0 in | Wt 185.2 lb

## 2020-02-05 DIAGNOSIS — R1310 Dysphagia, unspecified: Secondary | ICD-10-CM

## 2020-02-05 DIAGNOSIS — E785 Hyperlipidemia, unspecified: Secondary | ICD-10-CM

## 2020-02-05 DIAGNOSIS — E1169 Type 2 diabetes mellitus with other specified complication: Secondary | ICD-10-CM | POA: Diagnosis not present

## 2020-02-05 NOTE — Patient Instructions (Signed)
Dysphagia  Dysphagia is trouble swallowing. This condition occurs when solids and liquids stick in a person's throat on the way down to the stomach, or when food takes longer to get to the stomach than usual. You may have problems swallowing food, liquids, or both. You may also have pain while trying to swallow. It may take you more time and effort to swallow something. What are the causes? This condition may be caused by:  Muscle problems. They may make it difficult for you to move food and liquids through the esophagus, which is the tube that connects your mouth to your stomach.  Blockages. You may have ulcers, scar tissue, or inflammation that blocks the normal passage of food and liquids. Causes of these problems include: ? Acid reflux from your stomach into your esophagus (gastroesophageal reflux). ? Infections. ? Radiation treatment for cancer. ? Medicines taken without enough fluids to wash them down into your stomach.  Stroke. This can affect the nerves and make it difficult to swallow.  Nerve problems. These prevent signals from being sent to the muscles of your esophagus to squeeze (contract) and move what you swallow down to your stomach.  Globus pharyngeus. This is a common problem that involves a feeling like something is stuck in your throat or a sense of trouble with swallowing, even though nothing is wrong with the swallowing passages.  Certain conditions, such as cerebral palsy or Parkinson's disease. What are the signs or symptoms? Common symptoms of this condition include:  A feeling that solids or liquids are stuck in your throat on the way down to the stomach.  Pain while swallowing.  Coughing or gagging while trying to swallow. Other symptoms include:  Food moving back from your stomach to your mouth (regurgitation).  Noises coming from your throat.  Chest discomfort with swallowing.  A feeling of fullness when swallowing.  Drooling, especially when the  throat is blocked.  Heartburn. How is this diagnosed? This condition may be diagnosed by:  Barium X-ray. In this test, you will swallow a white liquid that sticks to the inside of your esophagus. X-ray images are then taken.  Endoscopy. In this test, a flexible telescope is inserted down your throat to look at your esophagus and your stomach.  CT scans and an MRI. How is this treated? Treatment for dysphagia depends on the cause of this condition, such as:  If the dysphagia is caused by acid reflux or infection, medicines may be used. They may include antibiotics and heartburn medicines.  If the dysphagia is caused by problems with the muscles, swallowing therapy may be used to help you strengthen your swallowing muscles. You may have to do specific exercises to strengthen the muscles or stretch them.  If the dysphagia is caused by a blockage or mass, procedures to remove the blockage may be done. You may need surgery and a feeding tube. You may need to make diet changes. Ask your health care provider for specific instructions. Follow these instructions at home: Medicines  Take over-the-counter and prescription medicines only as told by your health care provider.  If you were prescribed an antibiotic medicine, take it as told by your health care provider. Do not stop taking the antibiotic even if you start to feel better. Eating and drinking   Follow any diet changes as told by your health care provider.  Work with a diet and nutrition specialist (dietitian) to create an eating plan that will help you get the nutrients you need in   order to stay healthy.  Eat soft foods that are easier to swallow.  Cut your food into small pieces and eat slowly. Take small bites.  Eat and drink only when you are sitting upright.  Do not drink alcohol or caffeine. If you need help quitting, ask your health care provider. General instructions  Check your weight every day to make sure you are  not losing weight.  Do not use any products that contain nicotine or tobacco, such as cigarettes, e-cigarettes, and chewing tobacco. If you need help quitting, ask your health care provider.  Keep all follow-up visits as told by your health care provider. This is important. Contact a health care provider if you:  Lose weight because you cannot swallow.  Cough when you drink liquids.  Cough up partially digested food. Get help right away if you:  Cannot swallow your saliva.  Have shortness of breath, a fever, or both.  Have a hoarse voice and also have trouble swallowing. Summary  Dysphagia is trouble swallowing. This condition occurs when solids and liquids stick in a person's throat on the way down to the stomach. You may cough or gag while trying to swallow.  Dysphagia has many possible causes.  Treatment for dysphagia depends on the cause of the condition.  Keep all follow-up visits as told by your health care provider. This is important. This information is not intended to replace advice given to you by your health care provider. Make sure you discuss any questions you have with your health care provider. Document Revised: 11/30/2018 Document Reviewed: 11/30/2018 Elsevier Patient Education  2020 [employer]  

## 2020-02-05 NOTE — Progress Notes (Signed)
Established Patient Office Visit  Subjective:  Patient ID: Derrick Stokes, male    DOB: 02/21/1951  Age: 69 y.o. MRN: [id number]  CC:  Chief Complaint  Patient presents with  . patient complains of food getting stuck x4 days  . Vomiting    only occurred twice  . Nausea    after each meal  . Constipation    black stools noted over the weeked, used Pepo Bismol last Friday    HPI Derrick Stokes presents for solid food dysphagia for the past 4 to 5 days.  His chronic problems include history of CAD, GERD, history of Barrett's esophagus back on EGD with biopsy 2010, history of B12 deficiency, ongoing nicotine use, dyslipidemia.  He states his weight is actually up slightly but he has had some decline in appetite over the past several weeks.  He states for the past 4 days even when he eats and chews well sometimes food does not go down and he had to regurgitate some food.  He has had no difficulty with liquids.  He has been on chronic PPI with Protonix and takes this regularly.  Does smoke 1 pack cigarettes per day.  Also does been somewhat alcohol.  Last EGD apparently 2010 and results reviewed.  He did have Barrett's esophagus changes noted at that time.  He has not had any follow-up EGD since then.  Denies any recent chest pains.  No hematemesis.  Past Medical History:  Diagnosis Date  . Arthritis   . B12 deficiency    Borderline  . CAD (coronary artery disease)    a. s/p CABG 01/2011;  b. ETT Myoview 3/14:  Low risk;  c. 09/2014 Cath/PCI: LM nl, LAD 100p, LCX 70m/OM2 60-70p (2.75x28 Synergy DES), RCA dom, 49m (atherectomy, 3.5x24 Synergy DES), PDA nl, LIMA->LAD nl, VG->Diag nl, VG->OM 100, VG->RCA 100.  Derrick Stokes Hiatal hernia   . History of echocardiogram    Echo 10/16: EF 50-55%, no RWMA, Gr 1 DD, mild MR, mild TR  . HLD (hyperlipidemia)   . Kidney stones   . Leg pain    ABIs 4/14:  R 1.2, L 1.2, TBIs normal  . Peripheral neuropathy    From trauma  . Reactive airway disease  03/18/2018  . Tobacco abuse     Past Surgical History:  Procedure Laterality Date  . COLONOSCOPY     Neg  . CORONARY ARTERY BYPASS GRAFT    . EPIDURAL STEROIDS    . INTRAVASCULAR PRESSURE WIRE/FFR STUDY N/A 03/17/2018   Procedure: INTRAVASCULAR PRESSURE WIRE/FFR STUDY;  Surgeon: Lorretta Harp, MD;  Location: [place] [facility];  Service: Cardiovascular;  Laterality: N/A;  . LEFT HEART CATH AND CORONARY ANGIOGRAPHY N/A 03/17/2018   Procedure: LEFT HEART CATH AND CORONARY ANGIOGRAPHY;  Surgeon: Lorretta Harp, MD;  Location: [place] [facility];  Service: Cardiovascular;  Laterality: N/A;  . LEFT HEART CATHETERIZATION WITH CORONARY/GRAFT ANGIOGRAM N/A 09/18/2014   Procedure: LEFT HEART CATHETERIZATION WITH [name];  Surgeon: [name], MD;  Location: [facility] CATH LAB;  Service: Cardiovascular;  Laterality: N/A;  . PERCUTANEOUS CORONARY ROTOBLATOR INTERVENTION (PCI-R) N/A 09/20/2014   Procedure: PERCUTANEOUS CORONARY ROTOBLATOR INTERVENTION (PCI-R);  Surgeon: [name], MD;  Location: [facility] CATH [facility];  Service: Cardiovascular;  Laterality: N/A;    Family History  Problem Relation Age of Onset  . Diabetes Father   . Heart attack Father 90  . Heart disease Father        CAD  .  Cancer Mother     Social History   Socioeconomic History  . Marital status: Married    Spouse name: Not on file  . Number of children: Not on file  . Years of education: Not on file  . Highest education level: Not on file  Occupational History  . Occupation: Diasability  Tobacco Use  . Smoking status: Current Every Day Smoker    Packs/day: 1.00    Years: 51.00    Pack years: 51.00    Types: Cigarettes    Start date: 32  . Smokeless tobacco: Former Systems developer  . Tobacco comment: Quit Date 09/04/2019  Vaping Use  . Vaping Use: Never used  Substance and Sexual Activity  . Alcohol use: Yes    Alcohol/week: 4.0 standard drinks    Types: 4 Cans of beer per week    Comment: occ    . Drug use: No  . Sexual activity: Yes  Other Topics Concern  . Not on file  Social History Narrative   Married   Gets regular exercise - walks 1/4 mile per day 5 times a week without symptoms   Social Determinants of Radio broadcast assistant Strain:   . Difficulty of Paying Living Expenses:   Food Insecurity:   . Worried About Charity fundraiser in the Last Year:   . Arboriculturist in the Last Year:   Transportation Needs:   . Film/video editor (Medical):   Derrick Stokes Lack of Transportation (Non-Medical):   Physical Activity:   . Days of Exercise per Week:   . Minutes of Exercise per Session:   Stress:   . Feeling of Stress :   Social Connections:   . Frequency of Communication with Friends and Family:   . Frequency of Social Gatherings with Friends and Family:   . Attends Religious Services:   . Active Member of Clubs or Organizations:   . Attends Archivist Meetings:   Derrick Stokes Marital Status:   Intimate Partner Violence:   . Fear of Current or Ex-Partner:   . Emotionally Abused:   Derrick Stokes Physically Abused:   . Sexually Abused:     Outpatient Medications Prior to Visit  Medication Sig Dispense Refill  . aspirin EC 81 MG tablet Take 81 mg by mouth at bedtime.     Derrick Stokes atorvastatin (LIPITOR) 80 MG tablet TAKE 1 TABLET (80 MG TOTAL) BY MOUTH DAILY AT 6 PM. 90 tablet 3  . clopidogrel (PLAVIX) 75 MG tablet TAKE 1 TABLET BY MOUTH EVERY DAY WITH BREAKFAST 90 tablet 1  . metoprolol tartrate (LOPRESSOR) 25 MG tablet Take 1 tablet (25 mg total) by mouth 2 (two) times daily. 180 tablet 3  . nitroGLYCERIN (NITROSTAT) 0.4 MG SL tablet PLACE 1 TABLET UNDER THE TONGUE EVERY 5 MINUTES X 3 DOSES AS NEEDED FOR CHEST PAIN. 25 tablet 3  . Omega-3 Fatty Acids (FISH OIL) 1000 MG CPDR Take 1 capsule by mouth every morning.    . pantoprazole (PROTONIX) 40 MG tablet TAKE 1 TABLET BY MOUTH EVERY DAY 90 tablet 1  . tiZANidine (ZANAFLEX) 4 MG tablet Take 0.5-1 tablets (2-4 mg total) by mouth 2  (two) times daily as needed for muscle spasms. 60 tablet 1  . vitamin B-12 (CYANOCOBALAMIN) 500 MCG tablet Take 500 mcg by mouth daily.     No facility-administered medications prior to visit.    Allergies  Allergen Reactions  . Iodine Nausea And Vomiting    Turns white in face  and sweaty  . Iohexol Other (See Comments)    May have caused nausea and vomiting several years ago   . Shrimp [Shellfish Allergy] Nausea And Vomiting    Turns white in face and sweaty  . Isosorbide Dinitrate Other (See Comments)    headache    ROS Review of Systems  Constitutional: Positive for appetite change. Negative for chills and fever.  HENT: Positive for trouble swallowing.   Respiratory: Negative for cough and shortness of breath.   Cardiovascular: Negative for chest pain.  Gastrointestinal: Positive for nausea. Negative for abdominal pain and blood in stool.      Objective:    Physical Exam Vitals reviewed.  Cardiovascular:     Rate and Rhythm: Normal rate and regular rhythm.  Pulmonary:     Effort: Pulmonary effort is normal.     Breath sounds: Normal breath sounds.  Abdominal:     General: Bowel sounds are normal. There is no distension.     Palpations: Abdomen is soft.     Comments: Minimal tenderness epigastric region.  No masses.  Neurological:     Mental Status: He is alert.     BP 118/84 (BP Location: Left Arm, Patient Position: Sitting, Cuff Size: Normal)   Pulse 60   Temp 97.7 F (36.5 C) (Oral)   Ht 5\' 8"  (1.727 m)   Wt 185 lb 3.2 oz (84 kg)   BMI 28.16 kg/m  Wt Readings from Last 3 Encounters:  02/05/20 185 lb 3.2 oz (84 kg)  01/18/20 186 lb (84.4 kg)  09/18/19 181 lb 4 oz (82.2 kg)     There are no preventive care reminders to display for this patient.  There are no preventive care reminders to display for this patient.  Lab Results  Component Value Date   TSH 3.180 09/16/2014   Lab Results  Component Value Date   WBC 7.0 12/29/2018   HGB 15.7  12/29/2018   HCT 45.4 12/29/2018   MCV 90.8 12/29/2018   PLT 230 12/29/2018   Lab Results  Component Value Date   NA 138 03/08/2019   K 4.6 03/08/2019   CO2 23 03/08/2019   GLUCOSE 97 03/08/2019   BUN 14 03/08/2019   CREATININE 0.77 03/08/2019   BILITOT 0.9 03/08/2019   ALKPHOS 80 03/08/2019   AST 22 03/08/2019   ALT 44 03/08/2019   PROT 7.0 03/08/2019   ALBUMIN 4.3 03/08/2019   CALCIUM 9.7 03/08/2019   ANIONGAP 9 12/29/2018   GFR 114.61 09/20/2017   Lab Results  Component Value Date   CHOL 133 03/08/2019   Lab Results  Component Value Date   HDL 36 (L) 03/08/2019   Lab Results  Component Value Date   LDLCALC 68 03/08/2019   Lab Results  Component Value Date   TRIG 146 03/08/2019   Lab Results  Component Value Date   CHOLHDL 3.7 03/08/2019   Lab Results  Component Value Date   HGBA1C 5.3 09/14/2018      Assessment & Plan:   Problem List Items Addressed This Visit    None    Visit Diagnoses    Dysphagia, unspecified type    -  Primary   Relevant Orders   Ambulatory referral to Gastroenterology   CBC with Differential/Platelet   CMP   Hyperlipidemia associated with type 2 diabetes mellitus ([place])       Relevant Orders   Lipid panel    Patient presents with approximately 5-day history of solid food dysphagia.  He reports change in appetite though no documented weight loss.  Past history of Barrett's esophagus.  Risk factors including ongoing nicotine use and alcohol binge history.  -Needs further evaluation we will set up to see GI and will likely need EGD to further assess.  We put in referral as urgent and try to get in as soon as possible -Continue Protonix 40 mg daily -He is encouraged to eat slowly and chew food very well and avoid foods that are difficult to chew like tougher meats or steak -Consider pured quality foods until further assessed -Strongly advised to quit smoking -Obtain labs including CBC, comprehensive metabolic panel, lipid  panel  No orders of the defined types were placed in this encounter.   Follow-up: No follow-ups on file.    Carolann Littler, MD

## 2020-02-06 LAB — CBC WITH DIFFERENTIAL/PLATELET
Absolute Monocytes: 706 cells/uL (ref 200–950)
Basophils Absolute: 77 cells/uL (ref 0–200)
Basophils Relative: 0.9 %
Eosinophils Absolute: 187 cells/uL (ref 15–500)
Eosinophils Relative: 2.2 %
HCT: 47.5 % (ref 38.5–50.0)
Hemoglobin: 16.2 g/dL (ref 13.2–17.1)
Lymphs Abs: 2839 cells/uL (ref 850–3900)
MCH: 31.4 pg (ref 27.0–33.0)
MCHC: 34.1 g/dL (ref 32.0–36.0)
MCV: 92.1 fL (ref 80.0–100.0)
MPV: 9.3 fL (ref 7.5–12.5)
Monocytes Relative: 8.3 %
Neutro Abs: 4692 cells/uL (ref 1500–7800)
Neutrophils Relative %: 55.2 %
Platelets: 245 10*3/uL (ref 140–400)
RBC: 5.16 10*6/uL (ref 4.20–5.80)
RDW: 11.9 % (ref 11.0–15.0)
Total Lymphocyte: 33.4 %
WBC: 8.5 10*3/uL (ref 3.8–10.8)

## 2020-02-06 LAB — LIPID PANEL
Cholesterol: 163 mg/dL (ref <200)
HDL: 39 mg/dL — ABNORMAL LOW (ref >=40)
LDL Cholesterol (Calc): 101 mg/dL (calc) — ABNORMAL HIGH
Non-HDL Cholesterol (Calc): 124 mg/dL (calc) (ref <130)
Total CHOL/HDL Ratio: 4.2 (calc) (ref <5.0)
Triglycerides: 125 mg/dL (ref <150)

## 2020-02-06 LAB — COMPREHENSIVE METABOLIC PANEL
AG Ratio: 1.5 (calc) (ref 1.0–2.5)
ALT: 36 U/L (ref 9–46)
AST: 24 U/L (ref 10–35)
Albumin: 4.2 g/dL (ref 3.6–5.1)
Alkaline phosphatase (APISO): 69 U/L (ref 35–144)
BUN: 12 mg/dL (ref 7–25)
CO2: 25 mmol/L (ref 20–32)
Calcium: 9.2 mg/dL (ref 8.6–10.3)
Chloride: 103 mmol/L (ref 98–110)
Creat: 0.84 mg/dL (ref 0.70–1.25)
Globulin: 2.8 g/dL (calc) (ref 1.9–3.7)
Glucose, Bld: 79 mg/dL (ref 65–99)
Potassium: 4.5 mmol/L (ref 3.5–5.3)
Sodium: 137 mmol/L (ref 135–146)
Total Bilirubin: 1 mg/dL (ref 0.2–1.2)
Total Protein: 7 g/dL (ref 6.1–8.1)

## 2020-02-13 ENCOUNTER — Other Ambulatory Visit: Payer: Self-pay | Admitting: Student

## 2020-02-15 ENCOUNTER — Encounter: Payer: Self-pay | Admitting: Gastroenterology

## 2020-02-15 ENCOUNTER — Ambulatory Visit: Payer: Medicare HMO | Admitting: Gastroenterology

## 2020-02-15 ENCOUNTER — Ambulatory Visit (INDEPENDENT_AMBULATORY_CARE_PROVIDER_SITE_OTHER)
Admission: RE | Admit: 2020-02-15 | Discharge: 2020-02-15 | Disposition: A | Discharge status: Home / Self Care | Payer: Medicare HMO | Source: Ambulatory Visit | Attending: Gastroenterology | Admitting: Gastroenterology

## 2020-02-15 ENCOUNTER — Telehealth: Payer: Self-pay

## 2020-02-15 ENCOUNTER — Ambulatory Visit
Admission: RE | Admit: 2020-02-15 | Discharge: 2020-02-15 | Disposition: A | Discharge status: Home / Self Care | Payer: Medicare HMO | Source: Ambulatory Visit | Attending: Acute Care | Admitting: Acute Care

## 2020-02-15 ENCOUNTER — Other Ambulatory Visit: Payer: Self-pay

## 2020-02-15 VITALS — BP 110/80 | HR 63 | Ht 67.0 in | Wt 183.0 lb

## 2020-02-15 DIAGNOSIS — R69 Illness, unspecified: Secondary | ICD-10-CM | POA: Diagnosis not present

## 2020-02-15 DIAGNOSIS — R6881 Early satiety: Secondary | ICD-10-CM | POA: Diagnosis not present

## 2020-02-15 DIAGNOSIS — A480 Gas gangrene: Secondary | ICD-10-CM | POA: Diagnosis not present

## 2020-02-15 DIAGNOSIS — K2270 Barrett's esophagus without dysplasia: Secondary | ICD-10-CM

## 2020-02-15 DIAGNOSIS — Z86010 Personal history of colonic polyps: Secondary | ICD-10-CM | POA: Diagnosis not present

## 2020-02-15 DIAGNOSIS — Z87891 Personal history of nicotine dependence: Secondary | ICD-10-CM

## 2020-02-15 DIAGNOSIS — K449 Diaphragmatic hernia without obstruction or gangrene: Secondary | ICD-10-CM | POA: Diagnosis not present

## 2020-02-15 DIAGNOSIS — R140 Abdominal distension (gaseous): Secondary | ICD-10-CM

## 2020-02-15 DIAGNOSIS — R1110 Vomiting, unspecified: Secondary | ICD-10-CM

## 2020-02-15 DIAGNOSIS — R194 Change in bowel habit: Secondary | ICD-10-CM

## 2020-02-15 DIAGNOSIS — F17210 Nicotine dependence, cigarettes, uncomplicated: Secondary | ICD-10-CM

## 2020-02-15 MED ORDER — ONDANSETRON 8 MG PO TBDP
8.0000 mg | ORAL_TABLET | Freq: Three times a day (TID) | ORAL | 0 refills | Status: DC | PRN
Start: 2020-02-15 — End: 2021-12-04

## 2020-02-15 MED ORDER — PLENVU 140 G PO SOLR
1.0000 | ORAL | 0 refills | Status: DC
Start: 2020-02-15 — End: 2020-02-28

## 2020-02-15 NOTE — Progress Notes (Signed)

## 2020-02-15 NOTE — Telephone Encounter (Signed)
Request for surgical clearance:     Endoscopy Procedure  What type of surgery is being performed?    Endo/Colon   When is this surgery scheduled?     02/28/20  What type of clearance is required ?   Pharmacy  Are there any medications that need to be held prior to surgery and how long? Plavix x5 days   Practice name and name of physician performing surgery?      [facility]  What is your office phone and fax number?      Phone- [phone]  [phone]  Anesthesia type (None, local, MAC, general) ?       MAC

## 2020-02-15 NOTE — Patient Instructions (Signed)
You have been scheduled for an endoscopy and colonoscopy. Please follow the written instructions given to you at your visit today. Please pick up your prep supplies at the pharmacy within the next 1-3 days. If you use inhalers (even only as needed), please bring them with you on the day of your procedure.  START:Gas-X Extra Strength 1-4 times daily as needed.   You have been scheduled for an abdominal ultrasound at [facility] (1st floor of hospital) on  8/2/21at 10:00am. Please arrive 15 minutes prior to your appointment for registration. Make certain not to have anything to eat or drink 6 hours prior to your appointment. Should you need to reschedule your appointment, please contact radiology at [phone]. This test typically takes about 30 minutes to perform.  Your provider has requested that you have an abdominal x ray before leaving today. Please go to the basement floor to our Radiology department for the test.  We have sent the following medications to your pharmacy for you to pick up at your convenience: Zofran, Plenuv  You will be contaced by our office prior to your procedure for directions on holding your Plavix.  If you do not hear from our office 1 week prior to your scheduled procedure, please call [phone] to discuss.  Due to recent changes in healthcare laws, you may see the results of your imaging and laboratory studies on MyChart before your provider has had a chance to review them.  We understand that in some cases there may be results that are confusing or concerning to you. Not all laboratory results come back in the same time frame and the provider may be waiting for multiple results in order to interpret others.  Please give [place] 48 hours in order for your provider to thoroughly review all the results before contacting the office for clarification of your results.   Thank you for choosing me and [facility].  Dr. Rush Landmark

## 2020-02-16 NOTE — Telephone Encounter (Signed)
Derrick Stokes. [name] 69 year old male was last seen in the clinic on 01/18/2020.  He was doing well at that time and had no cardiac complaints.  He would like to have EGD/colonoscopy.  May his Plavix be held prior to the procedure?  He has a PMH of coronary artery disease status post CABG 2016, and LHC 8/19 showing 2/4 grafts patent; LCx and RCA stents patent, hyperlipidemia, tobacco use, peripheral neuropathy with normal ABIs 1/20.  During his 01/18/2020 appointment he complained of chronic cough related to his tobacco use and low back pain which required him to take frequent rest periods.  He was encouraged to quit smoking at that time.  Please direct response to CV DIV preop pool.  Thank you for your help.  Jossie Ng. Jillianna Stanek NP-C    02/16/2020, 1:49 PM [facility] [address] Office [phone] Fax [phone]

## 2020-02-18 ENCOUNTER — Encounter: Payer: Self-pay | Admitting: Gastroenterology

## 2020-02-18 DIAGNOSIS — Z860101 Personal history of adenomatous and serrated colon polyps: Secondary | ICD-10-CM | POA: Insufficient documentation

## 2020-02-18 DIAGNOSIS — R1110 Vomiting, unspecified: Secondary | ICD-10-CM | POA: Insufficient documentation

## 2020-02-18 DIAGNOSIS — Z86010 Personal history of colonic polyps: Secondary | ICD-10-CM | POA: Insufficient documentation

## 2020-02-18 DIAGNOSIS — K2270 Barrett's esophagus without dysplasia: Secondary | ICD-10-CM | POA: Insufficient documentation

## 2020-02-18 DIAGNOSIS — R194 Change in bowel habit: Secondary | ICD-10-CM | POA: Insufficient documentation

## 2020-02-18 DIAGNOSIS — K449 Diaphragmatic hernia without obstruction or gangrene: Secondary | ICD-10-CM | POA: Insufficient documentation

## 2020-02-18 DIAGNOSIS — R140 Abdominal distension (gaseous): Secondary | ICD-10-CM | POA: Insufficient documentation

## 2020-02-18 NOTE — Progress Notes (Signed)
[place] VISIT   Primary Care Provider [name], [name], [facility] [place] [address] [phone]  Referring Provider [name], [name], MD [address] [place],  [place] [address] [phone]  Patient Profile: Derrick Stokes is a 69 y.o. male with a pmh significant for CAD s/p CABG & PCI - on Plavix, hypertension, hyperlipidemia, nephrolithiasis, arthritis, tobacco use disorder, Hiatal hernia, Adenomatous colon polyps, Diverticulosis.  The patient presents to the [facility] for an evaluation and management of problem(s) noted below:  Problem List 1. Regurgitation of food   2. Barrett's esophagus without dysplasia   3. Hiatal hernia   4. Early satiety   5. Bloating   6. History of adenomatous polyp of colon   7. Change in bowel habits     History of Present Illness This is the patient's first visit to the outpatient [place] clinic in years.  He was previously followed by Dr. Sharlett Iles.  History of prior Barrett's esophagus without dysplasia but last endoscopy was 11 years ago and he also had a large hiatal hernia.  He has a history of tubular adenomas as well with last colonoscopy in 2010.  Patient states that over the course the last 2 to 3 weeks he has had increasing amounts of regurgitation.  He does not describe overt dysphagia but rather the sensation of food hanging out after being partially digested and coming back up.  He will have vomitus very infrequently but it does occur.  The food that regurgitates is what he may have just eaten within the last 30 minutes.  Liquids, seem to be worse for him.  He does describe heartburn after he has a regurgitation episode but not otherwise.  He has had more bloating.  He has had some early satiety as well.  There is been some fluctuation in his weight but it has not been decreasing dramatically.  He has 1 normal bowel movement on a daily basis.  The only difference  is that his bowel habits have changed in regards to the consistency of the bowel movement itself.  It is thinner in caliber.  The patient and wife are very worried about the cause of the symptoms.  He is on Plavix therapy for history of CAD.  GI Review of Systems Positive as above Negative for pain, melena, hematochezia  Review of Systems General: Denies fevers/chills HEENT: Denies oral lesions Cardiovascular: Denies chest pain/palpitations Pulmonary: Denies shortness of breath/nocturnal cough Gastroenterological: See HPI Genitourinary: Denies darkened urine or hematuria Hematological: Positive for easy bruising/bleeding due to Plavix Dermatological: Denies jaundice Psychological: Mood is concerned   Medications Current Outpatient Medications  Medication Sig Dispense Refill  . aspirin EC 81 MG tablet Take 81 mg by mouth at bedtime.     [name] atorvastatin (LIPITOR) 80 MG tablet TAKE 1 TABLET (80 MG TOTAL) BY MOUTH DAILY AT 6 PM. 90 tablet 3  . clopidogrel (PLAVIX) 75 MG tablet TAKE 1 TABLET BY MOUTH EVERY DAY WITH BREAKFAST 90 tablet 1  . metoprolol tartrate (LOPRESSOR) 25 MG tablet Take 1 tablet (25 mg total) by mouth 2 (two) times daily. 180 tablet 3  . nitroGLYCERIN (NITROSTAT) 0.4 MG SL tablet PLACE 1 TABLET UNDER THE TONGUE EVERY 5 MINUTES X 3 DOSES AS NEEDED FOR CHEST PAIN. 25 tablet 3  . Omega-3 Fatty Acids (FISH OIL) 1000 MG CPDR Take 1 capsule by mouth every morning.    . pantoprazole (PROTONIX) 40 MG tablet TAKE 1 TABLET BY  MOUTH EVERY DAY 90 tablet 1  . tiZANidine (ZANAFLEX) 4 MG tablet Take 0.5-1 tablets (2-4 mg total) by mouth 2 (two) times daily as needed for muscle spasms. 60 tablet 1  . vitamin B-12 (CYANOCOBALAMIN) 500 MCG tablet Take 500 mcg by mouth daily.    . ondansetron (ZOFRAN ODT) 8 MG disintegrating tablet Take 1 tablet (8 mg total) by mouth every 8 (eight) hours as needed for nausea or vomiting. 30 tablet 0  . PEG-KCl-NaCl-NaSulf-Na Asc-C (PLENVU) 140 g SOLR Take 1  kit by mouth as directed. Use coupon: BIN: [id number] PNC: CNRX Group: [id number] ID: [id number] 1 each 0   No current facility-administered medications for this visit.    Allergies Allergies  Allergen Reactions  . Iodine Nausea And Vomiting    Turns white in face and sweaty  . Iohexol Other (See Comments)    May have caused nausea and vomiting several years ago   . Shrimp [Shellfish Allergy] Nausea And Vomiting    Turns white in face and sweaty  . Isosorbide Dinitrate Other (See Comments)    headache    Histories Past Medical History:  Diagnosis Date  . Arthritis   . B12 deficiency    Borderline  . CAD (coronary artery disease)    a. s/p CABG 01/2011;  b. ETT Myoview 3/14:  Low risk;  c. 09/2014 Cath/PCI: LM nl, LAD 100p, LCX 66mOM2 60-70p (2.75x28 Synergy DES), RCA dom, 866matherectomy, 3.5x24 Synergy DES), PDA nl, LIMA->LAD nl, VG->Diag nl, VG->OM 100, VG->RCA 100.  . Marland Kitcheniatal hernia   . History of echocardiogram    Echo 10/16: EF 50-55%, no RWMA, Gr 1 DD, mild MR, mild TR  . HLD (hyperlipidemia)   . Kidney stones   . Leg pain    ABIs 4/14:  R 1.2, L 1.2, TBIs normal  . Peripheral neuropathy    From trauma  . Reactive airway disease 03/18/2018  . Tobacco abuse    Past Surgical History:  Procedure Laterality Date  . COLONOSCOPY     Neg  . CORONARY ARTERY BYPASS GRAFT    . EPIDURAL STEROIDS    . INTRAVASCULAR PRESSURE WIRE/FFR STUDY N/A 03/17/2018   Procedure: INTRAVASCULAR PRESSURE WIRE/FFR STUDY;  Surgeon: BeLorretta HarpMD;  [facility];  Service: Cardiovascular;  Laterality: N/A;  . LEFT HEART CATH AND CORONARY ANGIOGRAPHY N/A 03/17/2018   Procedure: LEFT HEART CATH AND CORONARY ANGIOGRAPHY;  Surgeon: BeLorretta HarpMD;  [facility];  Service: Cardiovascular;  Laterality: N/A;  . LEFT HEART CATHETERIZATION WITH CORONARY/GRAFT ANGIOGRAM N/A 09/18/2014   Procedure: LEFT HEART CATHETERIZATION WITH COBeatrix Fetters Surgeon:  JaJettie BoozeMD;  [facility];  Service: Cardiovascular;  Laterality: N/A;  . PERCUTANEOUS CORONARY ROTOBLATOR INTERVENTION (PCI-R) N/A 09/20/2014   Procedure: PERCUTANEOUS CORONARY ROTOBLATOR INTERVENTION (PCI-R);  Surgeon: JaJettie BoozeMD;  [facility];  Service: Cardiovascular;  Laterality: N/A;   Social History   Socioeconomic History  . Marital status: Married    Spouse name: Not on file  . Number of children: Not on file  . Years of education: Not on file  . Highest education level: Not on file  Occupational History  . Occupation: Diasability  Tobacco Use  . Smoking status: Current Every Day Smoker    Packs/day: 1.00    Years: 51.00    Pack years: 51.00    Types: Cigarettes    Start date: 1956. Smokeless tobacco: Never  Used  Vaping Use  . Vaping Use: Never used  Substance and Sexual Activity  . Alcohol use: Yes    Comment: has not drank beer in 2 weeks, avg of 6 beers daily  . Drug use: No  . Sexual activity: Yes  Other Topics Concern  . Not on file  Social History Narrative   Married   Gets regular exercise - walks 1/4 mile per day 5 times a week without symptoms   Social Determinants of Radio broadcast assistant Strain:   . Difficulty of Paying Living Expenses:   Food Insecurity:   . Worried About Charity fundraiser in the Last Year:   . Arboriculturist in the Last Year:   Transportation Needs:   . Film/video editor (Medical):   [name] Lack of Transportation (Non-Medical):   Physical Activity:   . Days of Exercise per Week:   . Minutes of Exercise per Session:   Stress:   . Feeling of Stress :   Social Connections:   . Frequency of Communication with Friends and Family:   . Frequency of Social Gatherings with Friends and Family:   . Attends Religious Services:   . Active Member of Clubs or Organizations:   . Attends Archivist Meetings:   [name] Marital Status:   Intimate Partner Violence:   . Fear of  Current or Ex-Partner:   . Emotionally Abused:   [name] Physically Abused:   . Sexually Abused:    Family History  Problem Relation Age of Onset  . Diabetes Father   . Heart attack Father 93  . Heart disease Father        CAD  . Cancer Mother   . Diabetes Brother   . Diabetes Brother   . Colon cancer Neg Hx   . Esophageal cancer Neg Hx   . Stomach cancer Neg Hx   . Pancreatic cancer Neg Hx   . Inflammatory bowel disease Neg Hx   . Liver disease Neg Hx   . Rectal cancer Neg Hx    I have reviewed his medical, social, and family history in detail and updated the electronic medical record as necessary.    PHYSICAL EXAMINATION  BP 110/80 (BP Location: Left Arm, Patient Position: Sitting, Cuff Size: Normal)   Pulse 63   Ht _0  (1.702 m)   Wt 183 lb (83 kg)   SpO2 96%   BMI 28.66 kg/m  Wt Readings from Last 3 Encounters:  02/15/20 183 lb (83 kg)  02/05/20 185 lb 3.2 oz (84 kg)  01/18/20 186 lb (84.4 kg)  GEN: NAD, appears older than stated age, nontoxic PSYCH: Cooperative, without pressured speech EYE: Conjunctivae pink, sclerae anicteric ENT: MMM, without oral ulcers, no erythema or exudates noted NECK: Supple CV: Nontachycardic RESP: No audible wheezing GI: NABS, soft, NT/ND, without rebound or guarding, no HSM appreciated MSK/EXT: No lower extremity edema SKIN: No jaundice NEURO:  Alert & Oriented x 3, no focal deficits   REVIEW OF DATA  I reviewed the following data at the time of this encounter:  GI Procedures and Studies  April 2010 endoscopy Barrett's esophagus in the distal esophagus for 5 to 6 cm biopsied.  Moderate size hiatal hernia.  Moderate gastritis and granularity noted with biopsies taken.  The duodenal bulb was normal in appearance as was the post bulbar duodenum.  April 2010 colonoscopy ENDOSCOPIC IMPRESSION: 1) Sessile polyp in the cecum 2) Severe diverticulosis in the sigmoid to  descending 3) Otherwise normal examination  Laboratory Studies    Reviewed those in epic  Imaging Studies  June 2020 CT abdomen pelvis with contrast IMPRESSION: No acute abnormality abdomen or pelvis. No finding to explain the patient's symptoms. Atherosclerosis. Fatty infiltration of the liver. Mild diverticulosis without diverticulitis.   ASSESSMENT  Mr. Mccorkel is a 69 y.o. male with a pmh significant for CAD s/p CABG & PCI - on Plavix, hypertension, hyperlipidemia, nephrolithiasis, arthritis, tobacco use disorder, Hiatal hernia, Adenomatous colon polyps, Diverticulosis.  The patient is seen today for evaluation and management of:  1. Regurgitation of food   2. Barrett's esophagus without dysplasia   3. Hiatal hernia   4. Early satiety   5. Bloating   6. History of adenomatous polyp of colon   7. Change in bowel habits    The patient is hemodynamically stable.  Clinically however, the patient has symptoms that are somewhat red flag and concern.  Diagnostic endoscopy to rule out progressive changes of Barrett's as well as to rule out structural abnormalities such as rings or webs or progressive hiatal hernia enlargement is important.  We will consider esophageal manometry as well depending on how things look after diagnostic endoscopy.  It does not sound like typical gastroparesis discomforts though we may consider the role of a solid food gastric emptying study if the above work-up is unremarkable.  He is due for colon cancer screening and colon polyp surveillance has been greater than 10 years.  We will try to do both of these procedures together.  He will need to be off Plavix and will ask cardiology for their input on holding Plavix for 5 days prior to procedure if possible.  The risks and benefits of endoscopic evaluation were discussed with the patient; these include but are not limited to the risk of perforation, infection, bleeding, missed lesions, lack of diagnosis, severe illness requiring hospitalization, as well as anesthesia and sedation  related illnesses.  The patient is agreeable to proceed.  All patient questions were answered, to the best of my ability, and the patient agrees to the aforementioned plan of action with follow-up as indicated.   PLAN  Proceed with scheduling diagnostic endoscopy Proceed with scheduling overdue surveillance colonoscopy KUB 2 view today Abdominal ultrasound to be completed Query solid-phase gastric emptying study if above work-up is unremarkable We will ask cardiology for Plavix hold prior to procedure   Orders Placed This Encounter  Procedures  . DG Abd 2 Views  . US Abdomen Complete  . Ambulatory referral to Gastroenterology    New Prescriptions   ONDANSETRON (ZOFRAN ODT) 8 MG DISINTEGRATING TABLET    Take 1 tablet (8 mg total) by mouth every 8 (eight) hours as needed for nausea or vomiting.   PEG-KCL-NACL-NASULF-NA ASC-C (PLENVU) 140 G SOLR    Take 1 kit by mouth as directed. Use coupon: BIN: [id number] PNC: CNRX Group: [id number] ID: [id number]   Modified Medications   No medications on file    Planned Follow Up No follow-ups on file.   Total Time in Face-to-Face and in Coordination of Care for patient including independent/personal interpretation/review of prior testing, medical history, examination, medication adjustment, communicating results with the patient directly, and documentation with the EHR is 45 minutes.   Justice Britain, MD [facility] Office # [phone]

## 2020-02-19 ENCOUNTER — Other Ambulatory Visit: Payer: Self-pay | Admitting: *Deleted

## 2020-02-19 ENCOUNTER — Other Ambulatory Visit: Payer: Self-pay

## 2020-02-19 ENCOUNTER — Ambulatory Visit (HOSPITAL_COMMUNITY)
Admission: RE | Admit: 2020-02-19 | Discharge: 2020-02-19 | Disposition: A | Discharge status: Home / Self Care | Payer: Medicare HMO | Source: Ambulatory Visit | Attending: Gastroenterology | Admitting: Gastroenterology

## 2020-02-19 DIAGNOSIS — R1110 Vomiting, unspecified: Secondary | ICD-10-CM | POA: Insufficient documentation

## 2020-02-19 DIAGNOSIS — N281 Cyst of kidney, acquired: Secondary | ICD-10-CM | POA: Diagnosis not present

## 2020-02-19 DIAGNOSIS — R140 Abdominal distension (gaseous): Secondary | ICD-10-CM | POA: Insufficient documentation

## 2020-02-19 DIAGNOSIS — K7689 Other specified diseases of liver: Secondary | ICD-10-CM | POA: Diagnosis not present

## 2020-02-19 DIAGNOSIS — R6881 Early satiety: Secondary | ICD-10-CM | POA: Diagnosis not present

## 2020-02-19 DIAGNOSIS — R194 Change in bowel habit: Secondary | ICD-10-CM | POA: Insufficient documentation

## 2020-02-19 DIAGNOSIS — Q600 Renal agenesis, unilateral: Secondary | ICD-10-CM | POA: Diagnosis not present

## 2020-02-19 DIAGNOSIS — F17210 Nicotine dependence, cigarettes, uncomplicated: Secondary | ICD-10-CM

## 2020-02-19 DIAGNOSIS — Z87891 Personal history of nicotine dependence: Secondary | ICD-10-CM

## 2020-02-19 NOTE — Telephone Encounter (Signed)
Ok to hold plavix x 5 days

## 2020-02-19 NOTE — Telephone Encounter (Signed)
   Primary Cardiologist: Sherren Mocha, MD  Chart reviewed as part of pre-operative protocol coverage. Given past medical history and time since last visit, based on ACC/AHA guidelines, Marjorie Lussier Bordner would be at acceptable risk for the planned procedure without further cardiovascular testing.   Per his primary cardiologist he may hold Plavix 5 days prior to the planned procedure.   Procedure: Endo/colon Date: 02/28/20  Called [name] home and relayed instructions to his wife, Jackelyn Poling. She verbalized understanding.   I will route this recommendation to the requesting party via Epic fax function and remove from pre-op pool.  Please call with questions.  Loel Dubonnet, NP 02/19/2020, 9:05 AM

## 2020-02-20 ENCOUNTER — Encounter: Payer: Self-pay | Admitting: Gastroenterology

## 2020-02-21 ENCOUNTER — Encounter: Payer: Self-pay | Admitting: Family Medicine

## 2020-02-21 MED ORDER — EZETIMIBE 10 MG PO TABS
10.0000 mg | ORAL_TABLET | Freq: Every day | ORAL | 3 refills | Status: DC
Start: 2020-02-21 — End: 2020-09-20

## 2020-02-24 ENCOUNTER — Other Ambulatory Visit: Payer: Self-pay | Admitting: Family Medicine

## 2020-02-28 ENCOUNTER — Encounter: Payer: Self-pay | Admitting: Gastroenterology

## 2020-02-28 ENCOUNTER — Ambulatory Visit (AMBULATORY_SURGERY_CENTER): Payer: Medicare HMO | Admitting: Gastroenterology

## 2020-02-28 ENCOUNTER — Other Ambulatory Visit: Payer: Self-pay

## 2020-02-28 VITALS — BP 115/72 | HR 71 | Temp 96.0°F | Resp 25 | Ht 67.0 in | Wt 183.0 lb

## 2020-02-28 DIAGNOSIS — Z86010 Personal history of colonic polyps: Secondary | ICD-10-CM

## 2020-02-28 DIAGNOSIS — J449 Chronic obstructive pulmonary disease, unspecified: Secondary | ICD-10-CM | POA: Diagnosis not present

## 2020-02-28 DIAGNOSIS — K449 Diaphragmatic hernia without obstruction or gangrene: Secondary | ICD-10-CM | POA: Diagnosis not present

## 2020-02-28 DIAGNOSIS — D122 Benign neoplasm of ascending colon: Secondary | ICD-10-CM | POA: Diagnosis not present

## 2020-02-28 DIAGNOSIS — D123 Benign neoplasm of transverse colon: Secondary | ICD-10-CM | POA: Diagnosis not present

## 2020-02-28 DIAGNOSIS — R194 Change in bowel habit: Secondary | ICD-10-CM | POA: Diagnosis not present

## 2020-02-28 DIAGNOSIS — K219 Gastro-esophageal reflux disease without esophagitis: Secondary | ICD-10-CM

## 2020-02-28 DIAGNOSIS — D125 Benign neoplasm of sigmoid colon: Secondary | ICD-10-CM | POA: Diagnosis not present

## 2020-02-28 DIAGNOSIS — K2270 Barrett's esophagus without dysplasia: Secondary | ICD-10-CM | POA: Diagnosis not present

## 2020-02-28 DIAGNOSIS — K3189 Other diseases of stomach and duodenum: Secondary | ICD-10-CM

## 2020-02-28 DIAGNOSIS — B9681 Helicobacter pylori [H. pylori] as the cause of diseases classified elsewhere: Secondary | ICD-10-CM | POA: Diagnosis not present

## 2020-02-28 DIAGNOSIS — R1310 Dysphagia, unspecified: Secondary | ICD-10-CM

## 2020-02-28 DIAGNOSIS — K2950 Unspecified chronic gastritis without bleeding: Secondary | ICD-10-CM | POA: Diagnosis not present

## 2020-02-28 DIAGNOSIS — R1110 Vomiting, unspecified: Secondary | ICD-10-CM

## 2020-02-28 DIAGNOSIS — I2510 Atherosclerotic heart disease of native coronary artery without angina pectoris: Secondary | ICD-10-CM | POA: Diagnosis not present

## 2020-02-28 HISTORY — PX: POLYPECTOMY: SHX149

## 2020-02-28 MED ORDER — SODIUM CHLORIDE 0.9 % IV SOLN: 500.0000 mL | Freq: Once | INTRAVENOUS | Status: DC

## 2020-02-28 NOTE — Progress Notes (Signed)
Report given to PACU, vss 

## 2020-02-28 NOTE — Progress Notes (Signed)
Robinul 0.1 mg IV given due large amount of secretions upon assessment.  MD made aware, vss 

## 2020-02-28 NOTE — Progress Notes (Signed)
1155 Ephedrine 10 mg given IV due to low BP, MD updated.   

## 2020-02-28 NOTE — Patient Instructions (Signed)
Thank you for allowing [place] to care for you today!    Resume Plavix in 3 days, Saturday 03/02/2020  Continue taking Protonix 40 mg by mouth daily.  Take 30 minutes before breakfast or dinner.  Follow soft diet today, handout given.   Return to your normal activities tomorrow.    YOU HAD AN ENDOSCOPIC PROCEDURE TODAY AT [facility]:   Refer to the procedure report that was given to you for any specific questions about what was found during the examination.  If the procedure report does not answer your questions, please call your gastroenterologist to clarify.  If you requested that your care partner not be given the details of your procedure findings, then the procedure report has been included in a sealed envelope for you to review at your convenience later.  YOU SHOULD EXPECT: Some feelings of bloating in the abdomen. Passage of more gas than usual.  Walking can help get rid of the air that was put into your GI tract during the procedure and reduce the bloating. If you had a lower endoscopy (such as a colonoscopy or flexible sigmoidoscopy) you may notice spotting of blood in your stool or on the toilet paper. If you underwent a bowel prep for your procedure, you may not have a normal bowel movement for a few days.  Please Note:  You might notice some irritation and congestion in your nose or some drainage.  This is from the oxygen used during your procedure.  There is no need for concern and it should clear up in a day or so.  SYMPTOMS TO REPORT IMMEDIATELY:   Following lower endoscopy (colonoscopy or flexible sigmoidoscopy):  Excessive amounts of blood in the stool  Significant tenderness or worsening of abdominal pains  Swelling of the abdomen that is new, acute  Fever of 100F or higher   Following upper endoscopy (EGD)  Vomiting of blood or coffee ground material  New chest pain or pain under the shoulder blades  Painful or persistently difficult swallowing  New  shortness of breath  Fever of 100F or higher  Black, tarry-looking stools  For urgent or emergent issues, a gastroenterologist can be reached at any hour by calling [phone]. Do not use MyChart messaging for urgent concerns.    DIET:  We do recommend a small meal at first, but then you may proceed to your regular diet.  Drink plenty of fluids but you should avoid alcoholic beverages for 24 hours.  ACTIVITY:  You should plan to take it easy for the rest of today and you should NOT DRIVE or use heavy machinery until tomorrow (because of the sedation medicines used during the test).    FOLLOW UP: Our staff will call the number listed on your records 48-72 hours following your procedure to check on you and address any questions or concerns that you may have regarding the information given to you following your procedure. If we do not reach you, we will leave a message.  We will attempt to reach you two times.  During this call, we will ask if you have developed any symptoms of COVID 19. If you develop any symptoms (ie: fever, flu-like symptoms, shortness of breath, cough etc.) before then, please call [phone].  If you test positive for Covid 19 in the 2 weeks post procedure, please call and report this information to [place].    If any biopsies were taken you will be contacted by phone or by letter within the  next 1-3 weeks.  Please call us at [phone] if you have not heard about the biopsies in 3 weeks.    SIGNATURES/CONFIDENTIALITY: You and/or your care partner have signed paperwork which will be entered into your electronic medical record.  These signatures attest to the fact that that the information above on your After Visit Summary has been reviewed and is understood.  Full responsibility of the confidentiality of this discharge information lies with you and/or your care-partner.YOU HAD AN ENDOSCOPIC PROCEDURE TODAY AT [facility]:   Refer to the procedure report  that was given to you for any specific questions about what was found during the examination.  If the procedure report does not answer your questions, please call your gastroenterologist to clarify.  If you requested that your care partner not be given the details of your procedure findings, then the procedure report has been included in a sealed envelope for you to review at your convenience later.  YOU SHOULD EXPECT: Some feelings of bloating in the abdomen. Passage of more gas than usual.  Walking can help get rid of the air that was put into your GI tract during the procedure and reduce the bloating. If you had a lower endoscopy (such as a colonoscopy or flexible sigmoidoscopy) you may notice spotting of blood in your stool or on the toilet paper. If you underwent a bowel prep for your procedure, you may not have a normal bowel movement for a few days.  Please Note:  You might notice some irritation and congestion in your nose or some drainage.  This is from the oxygen used during your procedure.  There is no need for concern and it should clear up in a day or so.  SYMPTOMS TO REPORT IMMEDIATELY:   Following lower endoscopy (colonoscopy or flexible sigmoidoscopy):  Excessive amounts of blood in the stool  Significant tenderness or worsening of abdominal pains  Swelling of the abdomen that is new, acute  Fever of 100F or higher   Following upper endoscopy (EGD)  Vomiting of blood or coffee ground material  New chest pain or pain under the shoulder blades  Painful or persistently difficult swallowing  New shortness of breath  Fever of 100F or higher  Black, tarry-looking stools  For urgent or emergent issues, a gastroenterologist can be reached at any hour by calling [phone]. Do not use MyChart messaging for urgent concerns.    DIET:  We do recommend a small meal at first, but then you may proceed to your regular diet.  Drink plenty of fluids but you should avoid alcoholic  beverages for 24 hours.  ACTIVITY:  You should plan to take it easy for the rest of today and you should NOT DRIVE or use heavy machinery until tomorrow (because of the sedation medicines used during the test).    FOLLOW UP: Our staff will call the number listed on your records 48-72 hours following your procedure to check on you and address any questions or concerns that you may have regarding the information given to you following your procedure. If we do not reach you, we will leave a message.  We will attempt to reach you two times.  During this call, we will ask if you have developed any symptoms of COVID 19. If you develop any symptoms (ie: fever, flu-like symptoms, shortness of breath, cough etc.) before then, please call [phone].  If you test positive for Covid 19 in the 2 weeks post procedure, please  call and report this information to [place].    If any biopsies were taken you will be contacted by phone or by letter within the next 1-3 weeks.  Please call us at [phone] if you have not heard about the biopsies in 3 weeks.    SIGNATURES/CONFIDENTIALITY: You and/or your care partner have signed paperwork which will be entered into your electronic medical record.  These signatures attest to the fact that that the information above on your After Visit Summary has been reviewed and is understood.  Full responsibility of the confidentiality of this discharge information lies with you and/or your care-partner.

## 2020-02-28 NOTE — Op Note (Signed)
[place] Patient Name: Derrick Stokes Procedure Date: 02/28/2020 11:49 AM MRN: [id number] Endoscopist: Justice Britain , MD Age: 69 Referring MD:  Date of Birth: 26-Feb-1951 Gender: Male Account #: [id number] Procedure:                Colonoscopy Indications:              High risk colon cancer surveillance: Personal                            history of colonic polyps with last colonoscopy >10                            years ago Medicines:                Monitored Anesthesia Care Procedure:                Pre-Anesthesia Assessment:                           - Prior to the procedure, a History and Physical                            was performed, and patient medications and                            allergies were reviewed. The patient's tolerance of                            previous anesthesia was also reviewed. The risks                            and benefits of the procedure and the sedation                            options and risks were discussed with the patient.                            All questions were answered, and informed consent                            was obtained. Prior Anticoagulants: The patient has                            taken Plavix (clopidogrel), last dose was 5 days                            prior to procedure. ASA Grade Assessment: III - A                            patient with severe systemic disease. After                            reviewing the risks and benefits, the patient was  deemed in satisfactory condition to undergo the                            procedure.                           After obtaining informed consent, the colonoscope                            was passed under direct vision. Throughout the                            procedure, the patient's blood pressure, pulse, and                            oxygen saturations were monitored continuously. The                             Colonoscope was introduced through the anus and                            advanced to the 5 cm into the ileum. The                            colonoscopy was performed without difficulty. The                            patient tolerated the procedure. The quality of the                            bowel preparation was good. The terminal ileum,                            ileocecal valve, appendiceal orifice, and rectum                            were photographed. Scope In: 12:13:50 PM Scope Out: 12:32:39 PM Scope Withdrawal Time: 0 hours 17 minutes 10 seconds  Total Procedure Duration: 0 hours 18 minutes 49 seconds  Findings:                 The digital rectal exam findings include                            hemorrhoids. Pertinent negatives include no                            palpable rectal lesions.                           The terminal ileum and ileocecal valve appeared                            normal.  A 30 mm polyp was found in the cecum. The polyp was                            granular lateral spreading. Polypectomy was not                            attempted due to need for EMR in the hospital-based                            setting.                           Four sessile polyps were found in the transverse                            colon (3) and ascending colon (1). The polyps were                            3 to 8 mm in size. These polyps were removed with a                            cold snare. Resection and retrieval were complete.                           A 28 mm polyp was found in the sigmoid colon                            (approximately 33 cm) The polyp was pedunculated.                            Polypectomy was not attempted due to need for EMR                            in the hospital-based setting. Tattoo placed on                            contralateral wall.                           Multiple small-mouthed diverticula were found  in                            the recto-sigmoid colon, sigmoid colon and                            descending colon.                           Segmental areas of moderately erythematous mucosa                            were found in the recto-sigmoid colon and in the  sigmoid colon following the regions of the                            diverticulosis. This was biopsied with a cold                            forceps for histology. Query if this is SCAD.                           Normal mucosa was found in the entire colon                            otherwise.                           Non-bleeding non-thrombosed internal hemorrhoids                            were found during retroflexion, during perianal                            exam and during digital exam. The hemorrhoids were                            Grade II (internal hemorrhoids that prolapse but                            reduce spontaneously). Complications:            No immediate complications. Estimated Blood Loss:     Estimated blood loss was minimal. Impression:               - Hemorrhoids found on digital rectal exam.                           - The examined portion of the ileum was normal.                           - One 30 mm polyp in the cecum. Resection not                            attempted due to need for EMR in hospital-based                            setting.                           - Four 3 to 8 mm polyps in the transverse colon and                            in the ascending colon, removed with a cold snare.                            Resected and retrieved.                           -  One 28 mm polyp in the sigmoid colon. Resection                            not attempted due to need for EMR in hospital-based                            setting. Tattooed contralateral wall for                            demarcation purposes.                           - Diverticulosis in the  recto-sigmoid colon, in the                            sigmoid colon and in the descending colon.                           - Erythematous mucosa in the recto-sigmoid colon                            and in the sigmoid colon found in regions of the                            diverticulosis - query SCAD. Biopsied.                           - Normal mucosa in the entire examined colon                            otherwise.                           - Non-bleeding non-thrombosed internal hemorrhoids. Recommendation:           - The patient will be observed post-procedure,                            until all discharge criteria are met.                           - Discharge patient to home.                           - Patient has a contact number available for                            emergencies. The signs and symptoms of potential                            delayed complications were discussed with the                            patient. Return to normal activities tomorrow.  Written discharge instructions were provided to the                            patient.                           - Continue present medications.                           - Resume Plavix (clopidogrel) at prior dose in 3                            days.                           - Await pathology results.                           - Repeat colonoscopy within next 2-4 months in                            Hospital Based setting for EMR and Advanced                            Resection of the polyps noted. Will need to have                            Polyloop available as well as typical EMR tools.                           - The findings and recommendations were discussed                            with the patient.                           - The findings and recommendations were discussed                            with the patient's family. Justice Britain, MD 02/28/2020 12:57:16 PM

## 2020-02-28 NOTE — Op Note (Signed)
[place] Patient Name: Derrick Stokes Procedure Date: 02/28/2020 11:49 AM MRN: [id number] Endoscopist: Justice Britain , MD Age: 69 Referring MD:  Date of Birth: 1951/03/29 Gender: Male Account #: [id number] Procedure:                Upper GI endoscopy Indications:              Dysphagia, Barrett's esophagus, Follow-up of                            Barrett's esophagus, Early satiety, Regurgitation Medicines:                Monitored Anesthesia Care Procedure:                Pre-Anesthesia Assessment:                           - Prior to the procedure, a History and Physical                            was performed, and patient medications and                            allergies were reviewed. The patient's tolerance of                            previous anesthesia was also reviewed. The risks                            and benefits of the procedure and the sedation                            options and risks were discussed with the patient.                            All questions were answered, and informed consent                            was obtained. Prior Anticoagulants: The patient has                            taken Plavix (clopidogrel), last dose was 5 days                            prior to procedure. ASA Grade Assessment: III - A                            patient with severe systemic disease. After                            reviewing the risks and benefits, the patient was                            deemed in satisfactory condition to undergo the  procedure.                           After obtaining informed consent, the endoscope was                            passed under direct vision. Throughout the                            procedure, the patient's blood pressure, pulse, and                            oxygen saturations were monitored continuously. The                            Endoscope was introduced through the  mouth, and                            advanced to the second part of duodenum. The upper                            GI endoscopy was accomplished without difficulty.                            The patient tolerated the procedure. Scope In: Scope Out: Findings:                 No gross lesions were noted in the proximal                            esophagus and in the mid esophagus. Biopsies were                            taken with a cold forceps for histology to rule out                            EoE/LoE.                           The esophagus and gastroesophageal junction were                            examined with white light and narrow band imaging                            (NBI) from a forward view and retroflexed position.                            There were esophageal mucosal changes consistent                            with short-segment Barrett's esophagus (history of                            this previously biopsied years ago). These  changes                            involved the mucosa at the upper extent of the                            gastric folds (34 cm from the incisors) extending                            to the Z-line (35 cm from the incisors).                            Circumferential salmon-colored mucosa was present                            from 34 to 36 cm. Mucosa was biopsied with a cold                            forceps for histology in 4 quadrants at intervals                            of 1 cm from 34 to 36 cm from the incisors. A total                            of 3 specimen bottles were sent to pathology.                           After the rest of the endoscopy was complete, a                            guidewire was placed and the scope was withdrawn.                            Dilation was performed in the entire esophagus with                            a Savary dilator with no resistance at 16 mm and                            mild  resistance at 18 mm. The dilation site was                            examined following endoscope reinsertion and showed                            no change and no perforation.                           A 3 cm hiatal hernia was present.                           Patchy moderately erythematous mucosa without  bleeding was found in the gastric antrum.                           No other gross lesions were noted in the entire                            examined stomach. Biopsies were taken with a cold                            forceps for histology and Helicobacter pylori                            testing.                           No gross lesions were noted in the duodenal bulb,                            in the first portion of the duodenum and in the                            second portion of the duodenum. Biopsies were taken                            with a cold forceps for histology. Complications:            No immediate complications. Estimated Blood Loss:     Estimated blood loss was minimal. Impression:               - No gross lesions in esophagus proximally.                            Biopsied.                           - Esophageal mucosal changes consistent with                            previously known (though not surveillanced for                            years) short-segment Barrett's esophagus. Biopsied.                           - Esophageal dilation performed.                           - 3 cm hiatal hernia.                           - Erythematous mucosa in the antrum. No other gross                            lesions in the stomach. Biopsied.                           -  No gross lesions in the duodenal bulb, in the                            first portion of the duodenum and in the second                            portion of the duodenum. Biopsied. Recommendation:           - Proceed to scheduled colonoscopy.                            - For now, continue Protonix 40 mg daily (take 30                            minutes before breakfast or dinner).                           - Await pathology results. There could be role for                            increasing PPI to twice daily but will decide based                            on pathology and follow up.                           - Dilation diet as per protocol.                           - If symptoms of regurgitation recur or dysphagia                            persists post-dilation then will plan for                            Esophageal manometry.                           - The findings and recommendations were discussed                            with the patient.                           - The findings and recommendations were discussed                            with the patient's family. Justice Britain, MD 02/28/2020 12:47:38 PM

## 2020-02-28 NOTE — Progress Notes (Signed)
Called to room to assist during endoscopic procedure.  Patient ID and intended procedure confirmed with present staff. Received instructions for my participation in the procedure from the performing physician.  

## 2020-03-01 ENCOUNTER — Telehealth: Payer: Self-pay | Admitting: *Deleted

## 2020-03-01 NOTE — Telephone Encounter (Signed)
1. Have you developed a fever since your procedure? no  2.   Have you had an respiratory symptoms (SOB or cough) since your procedure? no  3.   Have you tested positive for COVID 19 since your procedure no  4.   Have you had any family members/close contacts diagnosed with the COVID 19 since your procedure?  no   If yes to any of these questions please route to Joylene John, RN and Joella Prince, RN Follow up Call-  Call back number 02/28/2020  Post procedure Call Back phone  # [phone]  Permission to leave phone message Yes  Some recent data might be hidden     Patient questions:  Do you have a fever, pain , or abdominal swelling? No. Pain Score  0 *  Have you tolerated food without any problems? Yes.    Have you been able to return to your normal activities? Yes.    Do you have any questions about your discharge instructions: Diet   No. Medications  No. Follow up visit  No.  Do you have questions or concerns about your Care? No.  Actions: * If pain score is 4 or above: pt's wife says pt does have a runny nose and sneezing ,no fever. Encouraged pt to see PCP id begins to have fever ,cough or resp symptoms  No action needed, pain <4.

## 2020-03-09 HISTORY — PX: COLONOSCOPY: SHX174

## 2020-03-10 ENCOUNTER — Encounter: Payer: Self-pay | Admitting: Gastroenterology

## 2020-03-12 ENCOUNTER — Other Ambulatory Visit: Payer: Self-pay

## 2020-03-12 ENCOUNTER — Telehealth: Payer: Self-pay

## 2020-03-12 DIAGNOSIS — D122 Benign neoplasm of ascending colon: Secondary | ICD-10-CM

## 2020-03-12 DIAGNOSIS — Z86010 Personal history of colonic polyps: Secondary | ICD-10-CM

## 2020-03-12 DIAGNOSIS — A048 Other specified bacterial intestinal infections: Secondary | ICD-10-CM

## 2020-03-12 DIAGNOSIS — D123 Benign neoplasm of transverse colon: Secondary | ICD-10-CM

## 2020-03-12 MED ORDER — DOXYCYCLINE HYCLATE 100 MG PO CAPS: 100.0000 mg | ORAL_CAPSULE | Freq: Two times a day (BID) | ORAL | 0 refills | Status: AC

## 2020-03-12 MED ORDER — PYLERA 140-125-125 MG PO CAPS: 3.0000 | ORAL_CAPSULE | Freq: Three times a day (TID) | ORAL | 0 refills | Status: DC

## 2020-03-12 MED ORDER — BISMUTH SUBSALICYLATE 262 MG PO TABS: 524.0000 mg | ORAL_TABLET | Freq: Four times a day (QID) | ORAL | 0 refills | Status: AC

## 2020-03-12 MED ORDER — METRONIDAZOLE 250 MG PO TABS: 250.0000 mg | ORAL_TABLET | Freq: Two times a day (BID) | ORAL | 0 refills | Status: AC

## 2020-03-12 MED ORDER — PANTOPRAZOLE SODIUM 40 MG PO TBEC: 40.0000 mg | DELAYED_RELEASE_TABLET | Freq: Two times a day (BID) | ORAL | 0 refills | Status: DC

## 2020-03-12 NOTE — Telephone Encounter (Signed)
   Primary Cardiologist: Sherren Mocha, MD  Chart reviewed as part of pre-operative protocol coverage. Given past medical history and time since last visit, based on ACC/AHA guidelines, Derrick Stokes would be at acceptable risk for the planned procedure without further cardiovascular testing.   Ok to hold Plavix 5 days pre op if needed, resume as soon as safe post op.   I will route this recommendation to the requesting party via Epic fax function and remove from pre-op pool.  Please call with questions.  Kerin Ransom, PA-C 03/12/2020, 9:54 AM

## 2020-03-12 NOTE — Telephone Encounter (Signed)
[facility] Pre-operative Risk Assessment     Request for surgical clearance:     Endoscopy Procedure  What type of surgery is being performed?    Colonoscopy with EMR When is this surgery scheduled?   05/08/20  What type of clearance is required ?   Pharmacy  Are there any medications that need to be held prior to surgery and how long?  Plavix   Practice name and name of physician performing surgery?      [facility] - Dr. Justice Britain  What is your office phone and fax number?      Phone- [phone]  [phone]  Anesthesia type (None, local, MAC, general) ?       MAC

## 2020-04-08 ENCOUNTER — Telehealth: Payer: Self-pay | Admitting: Gastroenterology

## 2020-04-09 NOTE — Telephone Encounter (Signed)
I spoke with the pt's wife and advised that the pt needs to stop PPI and in 2 weeks come in and pick up the stool kit. The pt has been advised of the information and verbalized understanding.

## 2020-04-11 NOTE — Telephone Encounter (Signed)
Patient's wife called states the patient has not been able to eat since he was recommended to stop the Protonix medication seeking advise

## 2020-04-11 NOTE — Telephone Encounter (Signed)
The pt was advised to stop PPI for H pylori stool testing.  Since stopping PPI reflux has gotten worse a he is unable to eat. I discussed anti reflux precautions.  The pt's wife will have the pt monitor his diet and follow precautions and hopefully he will be able to make it for the full 2 weeks.  She will call back if he continues to have problems.

## 2020-04-18 ENCOUNTER — Other Ambulatory Visit: Payer: Self-pay | Admitting: Cardiovascular Disease

## 2020-04-18 DIAGNOSIS — E785 Hyperlipidemia, unspecified: Secondary | ICD-10-CM

## 2020-04-22 NOTE — Telephone Encounter (Signed)
Patients wife called and states the two weeks is about up and she would like to know what to do next. Please advise

## 2020-04-22 NOTE — Telephone Encounter (Signed)
The pt has been advised that he needs to come in and pick up stool kit.  The pt has been advised of the information and verbalized understanding.

## 2020-04-23 ENCOUNTER — Telehealth: Payer: Self-pay

## 2020-04-23 ENCOUNTER — Encounter: Payer: Self-pay | Admitting: Family Medicine

## 2020-04-23 DIAGNOSIS — R69 Illness, unspecified: Secondary | ICD-10-CM | POA: Diagnosis not present

## 2020-04-23 NOTE — Telephone Encounter (Signed)
Sent patient a reminder message via My Chart regarding stool study.

## 2020-04-23 NOTE — Telephone Encounter (Signed)
error 

## 2020-04-23 NOTE — Telephone Encounter (Signed)
-----   Message from Yevette Edwards, RN sent at 03/12/2020  8:57 AM EDT ----- Regarding: Labs H. Pylori stool antigen - order in epic

## 2020-04-26 ENCOUNTER — Other Ambulatory Visit: Payer: Medicare HMO

## 2020-04-26 DIAGNOSIS — A048 Other specified bacterial intestinal infections: Secondary | ICD-10-CM | POA: Diagnosis not present

## 2020-04-28 LAB — H. PYLORI ANTIGEN, STOOL: H pylori Ag, Stl: NEGATIVE

## 2020-05-01 ENCOUNTER — Other Ambulatory Visit: Payer: Self-pay

## 2020-05-01 ENCOUNTER — Ambulatory Visit (AMBULATORY_SURGERY_CENTER): Payer: Self-pay

## 2020-05-01 VITALS — Ht 68.0 in | Wt 182.0 lb

## 2020-05-01 DIAGNOSIS — Z860100 Personal history of colon polyps, unspecified: Secondary | ICD-10-CM

## 2020-05-01 MED ORDER — PLENVU 140 G PO SOLR: 1.0000 | ORAL | 0 refills | Status: DC

## 2020-05-01 NOTE — Progress Notes (Signed)
No egg or soy allergy known to patient  No issues with past sedation with any surgeries or procedures No intubation problems in the past  No FH of Malignant Hyperthermia No diet pills per patient No home 02 use per patient  No blood thinners per patient  Pt denies issues with constipation  No A fib or A flutter  COVID 19 guidelines implemented in [facility] today with Pt and RN  Coupon given to pt in [facility] today , Code to Pharmacy  COVID screening scheduled for 05/04/2020 at 11:05 am; patient and wife aware of appt date/time; Due to the COVID-19 pandemic we are asking patients to follow these guidelines. Please only bring one care partner. Please be aware that your care partner may wait in the car in the parking lot or if they feel like they will be too hot to wait in the car, they may wait in the lobby on the 4th floor. All care partners are required to wear a mask the entire time (we do not have any that we can provide them), they need to practice social distancing, and we will do a Covid check for all patient's and care partners when you arrive. Also we will check their temperature and your temperature. If the care partner waits in their car they need to stay in the parking lot the entire time and we will call them on their cell phone when the patient is ready for discharge so they can bring the car to the front of the building. Also all patient's will need to wear a mask into building.

## 2020-05-03 ENCOUNTER — Encounter: Payer: Self-pay | Admitting: Gastroenterology

## 2020-05-04 ENCOUNTER — Other Ambulatory Visit (HOSPITAL_COMMUNITY)
Admission: RE | Admit: 2020-05-04 | Discharge: 2020-05-04 | Disposition: A | Discharge status: Home / Self Care | Payer: Medicare HMO | Source: Ambulatory Visit | Attending: Gastroenterology | Admitting: Gastroenterology

## 2020-05-04 DIAGNOSIS — Z01812 Encounter for preprocedural laboratory examination: Secondary | ICD-10-CM | POA: Diagnosis not present

## 2020-05-04 DIAGNOSIS — Z20822 Contact with and (suspected) exposure to covid-19: Secondary | ICD-10-CM | POA: Diagnosis not present

## 2020-05-05 LAB — SARS CORONAVIRUS 2 (TAT 6-24 HRS): SARS Coronavirus 2: NEGATIVE

## 2020-05-07 ENCOUNTER — Encounter (HOSPITAL_COMMUNITY): Payer: Self-pay | Admitting: Gastroenterology

## 2020-05-07 ENCOUNTER — Telehealth: Payer: Self-pay | Admitting: Gastroenterology

## 2020-05-07 ENCOUNTER — Other Ambulatory Visit: Payer: Self-pay

## 2020-05-07 NOTE — Telephone Encounter (Signed)
The pt's wife wanted to make sure that he could take his meds with a small sip of water in the morning prior to the procedure.  I confirmed that he could take all meds except blood thinner and she did confirm that he has held his blood thinner for 5 days.

## 2020-05-07 NOTE — Progress Notes (Addendum)
I spoke to Libby Maw, Tim Silverman's wife, there is permission in [place].  Mr. Diltz is hard of hearing.  Mrs Prioleau states that Mr. Dildine has not had shortness of breath in over a year.   Covid test was negative on 05/06/20, patient has been in quarantine since that time.  Mrs. Ragas states that patient  Has no questions regarding prep.

## 2020-05-08 ENCOUNTER — Encounter (HOSPITAL_COMMUNITY)
Admission: RE | Disposition: A | Discharge status: Home / Self Care | Payer: Self-pay | Source: Home / Self Care | Attending: Gastroenterology

## 2020-05-08 ENCOUNTER — Other Ambulatory Visit: Payer: Self-pay

## 2020-05-08 ENCOUNTER — Ambulatory Visit (HOSPITAL_COMMUNITY)
Admission: RE | Admit: 2020-05-08 | Discharge: 2020-05-08 | Disposition: A | Discharge status: Home / Self Care | Payer: Medicare HMO | Attending: Gastroenterology | Admitting: Gastroenterology

## 2020-05-08 ENCOUNTER — Ambulatory Visit (HOSPITAL_COMMUNITY): Payer: Medicare HMO | Admitting: Certified Registered Nurse Anesthetist

## 2020-05-08 ENCOUNTER — Encounter (HOSPITAL_COMMUNITY): Payer: Self-pay | Admitting: Gastroenterology

## 2020-05-08 DIAGNOSIS — I10 Essential (primary) hypertension: Secondary | ICD-10-CM | POA: Diagnosis not present

## 2020-05-08 DIAGNOSIS — K641 Second degree hemorrhoids: Secondary | ICD-10-CM | POA: Diagnosis not present

## 2020-05-08 DIAGNOSIS — K649 Unspecified hemorrhoids: Secondary | ICD-10-CM | POA: Diagnosis not present

## 2020-05-08 DIAGNOSIS — K621 Rectal polyp: Secondary | ICD-10-CM | POA: Diagnosis not present

## 2020-05-08 DIAGNOSIS — K5730 Diverticulosis of large intestine without perforation or abscess without bleeding: Secondary | ICD-10-CM | POA: Diagnosis not present

## 2020-05-08 DIAGNOSIS — Z86010 Personal history of colonic polyps: Secondary | ICD-10-CM | POA: Diagnosis not present

## 2020-05-08 DIAGNOSIS — D120 Benign neoplasm of cecum: Secondary | ICD-10-CM | POA: Insufficient documentation

## 2020-05-08 DIAGNOSIS — I25110 Atherosclerotic heart disease of native coronary artery with unstable angina pectoris: Secondary | ICD-10-CM | POA: Diagnosis not present

## 2020-05-08 DIAGNOSIS — D125 Benign neoplasm of sigmoid colon: Secondary | ICD-10-CM | POA: Diagnosis not present

## 2020-05-08 DIAGNOSIS — Z951 Presence of aortocoronary bypass graft: Secondary | ICD-10-CM | POA: Insufficient documentation

## 2020-05-08 DIAGNOSIS — K635 Polyp of colon: Secondary | ICD-10-CM | POA: Diagnosis not present

## 2020-05-08 DIAGNOSIS — D123 Benign neoplasm of transverse colon: Secondary | ICD-10-CM

## 2020-05-08 DIAGNOSIS — Z8719 Personal history of other diseases of the digestive system: Secondary | ICD-10-CM | POA: Diagnosis not present

## 2020-05-08 DIAGNOSIS — J449 Chronic obstructive pulmonary disease, unspecified: Secondary | ICD-10-CM | POA: Insufficient documentation

## 2020-05-08 DIAGNOSIS — I2510 Atherosclerotic heart disease of native coronary artery without angina pectoris: Secondary | ICD-10-CM | POA: Diagnosis not present

## 2020-05-08 DIAGNOSIS — D122 Benign neoplasm of ascending colon: Secondary | ICD-10-CM

## 2020-05-08 DIAGNOSIS — F17210 Nicotine dependence, cigarettes, uncomplicated: Secondary | ICD-10-CM | POA: Insufficient documentation

## 2020-05-08 HISTORY — DX: Personal history of urinary calculi: Z87.442

## 2020-05-08 HISTORY — DX: Unspecified hearing loss, unspecified ear: H91.90

## 2020-05-08 HISTORY — PX: HEMOSTASIS CLIP PLACEMENT: SHX6857

## 2020-05-08 HISTORY — PX: ENDOSCOPIC MUCOSAL RESECTION: SHX6839

## 2020-05-08 HISTORY — PX: POLYPECTOMY: SHX5525

## 2020-05-08 HISTORY — PX: HEMOSTASIS CONTROL: SHX6838

## 2020-05-08 HISTORY — PX: SUBMUCOSAL LIFTING INJECTION: SHX6855

## 2020-05-08 HISTORY — PX: COLONOSCOPY WITH PROPOFOL: SHX5780

## 2020-05-08 HISTORY — PX: SCLEROTHERAPY: SHX6841

## 2020-05-08 SURGERY — COLONOSCOPY WITH PROPOFOL
Anesthesia: Monitor Anesthesia Care

## 2020-05-08 MED ORDER — PHENYLEPHRINE 40 MCG/ML (10ML) SYRINGE FOR IV PUSH (FOR BLOOD PRESSURE SUPPORT): PREFILLED_SYRINGE | INTRAVENOUS | Status: DC | PRN

## 2020-05-08 MED ORDER — PROPOFOL 500 MG/50ML IV EMUL
INTRAVENOUS | Status: DC | PRN
  Administered 2020-05-08: 150 ug/kg/min via INTRAVENOUS

## 2020-05-08 MED ORDER — PROPOFOL 10 MG/ML IV BOLUS
INTRAVENOUS | Status: DC | PRN
  Administered 2020-05-08: 40 mg via INTRAVENOUS

## 2020-05-08 MED ORDER — CLOPIDOGREL BISULFATE 75 MG PO TABS
75.0000 mg | ORAL_TABLET | Freq: Every day | ORAL | 1 refills | Status: DC
Start: 2020-05-11 — End: 2020-08-09

## 2020-05-08 MED ORDER — LIDOCAINE HCL URETHRAL/MUCOSAL 2 % EX GEL
CUTANEOUS | Status: DC | PRN
  Administered 2020-05-08: 1

## 2020-05-08 MED ORDER — PHENYLEPHRINE HCL-NACL 10-0.9 MG/250ML-% IV SOLN
INTRAVENOUS | Status: DC | PRN
  Administered 2020-05-08: 60 ug/min via INTRAVENOUS

## 2020-05-08 MED ORDER — SODIUM CHLORIDE 0.9 % IV SOLN: INTRAVENOUS | Status: DC

## 2020-05-08 MED ORDER — SODIUM CHLORIDE (PF) 0.9 % IJ SOLN
PREFILLED_SYRINGE | INTRAMUSCULAR | Status: DC | PRN
  Administered 2020-05-08: 1.5 mL

## 2020-05-08 MED ORDER — LIDOCAINE VISCOUS HCL 2 % MT SOLN
OROMUCOSAL | Status: AC
  Filled 2020-05-08: qty 15

## 2020-05-08 MED ORDER — LACTATED RINGERS IV SOLN: INTRAVENOUS | Status: DC | PRN

## 2020-05-08 MED ORDER — LIDOCAINE 2% (20 MG/ML) 5 ML SYRINGE
INTRAMUSCULAR | Status: DC | PRN
  Administered 2020-05-08: 40 mg via INTRAVENOUS

## 2020-05-08 MED ORDER — EPHEDRINE SULFATE-NACL 50-0.9 MG/10ML-% IV SOSY
PREFILLED_SYRINGE | INTRAVENOUS | Status: DC | PRN
  Administered 2020-05-08: 15 mg via INTRAVENOUS
  Administered 2020-05-08 (×3): 5 mg via INTRAVENOUS
  Administered 2020-05-08: 10 mg via INTRAVENOUS

## 2020-05-08 SURGICAL SUPPLY — 22 items

## 2020-05-08 NOTE — H&P (Signed)
GASTROENTEROLOGY PROCEDURE H&P NOTE   Primary Care Physician: [name], [name], MD  HPI: Derrick Stokes is a 69 y.o. male who presents for Colonoscopy with attempt at EMR of cecal polyp and  polyp.  Past Medical History:  Diagnosis Date  . Arthritis   . B12 deficiency    Borderline  . CAD (coronary artery disease)    a. s/p CABG 01/2011;  b. ETT Myoview 3/14:  Low risk;  c. 09/2014 Cath/PCI: LM nl, LAD 100p, LCX 62m/OM2 60-70p (2.75x28 Synergy DES), RCA dom, 51m (atherectomy, 3.5x24 Synergy DES), PDA nl, LIMA->LAD nl, VG->Diag nl, VG->OM 100, VG->RCA 100.  . Cataract    bilateral sx   . COPD (chronic obstructive pulmonary disease) ([place])   . GERD (gastroesophageal reflux disease)    on meds  . Hiatal hernia   . History of echocardiogram    Echo 10/16: EF 50-55%, no RWMA, Gr 1 DD, mild MR, mild TR  . History of kidney stones    Passed  . HLD (hyperlipidemia)    on meds  . HOH (hard of hearing)   . Hypertension    on meds  . Leg pain    ABIs 4/14:  R 1.2, L 1.2, TBIs normal  . Peripheral neuropathy    From trauma  . Reactive airway disease 03/18/2018  . Tobacco abuse    Past Surgical History:  Procedure Laterality Date  . COLONOSCOPY  03/09/2020   Neg  . CORONARY ARTERY BYPASS GRAFT  2013   x5  . EPIDURAL STEROIDS    . EYE SURGERY Bilateral    Cataract  . INTRAVASCULAR PRESSURE WIRE/FFR STUDY N/A 03/17/2018   Procedure: INTRAVASCULAR PRESSURE WIRE/FFR STUDY;  Surgeon: Lorretta Harp, MD;  Location: [facility];  Service: Cardiovascular;  Laterality: N/A;  . LEFT HEART CATH AND CORONARY ANGIOGRAPHY N/A 03/17/2018   Procedure: LEFT HEART CATH AND CORONARY ANGIOGRAPHY;  Surgeon: Lorretta Harp, MD;  Location: [facility];  Service: Cardiovascular;  Laterality: N/A;  . LEFT HEART CATHETERIZATION WITH CORONARY/GRAFT ANGIOGRAM N/A 09/18/2014   Procedure: LEFT HEART CATHETERIZATION WITH [name];  Surgeon: [name], MD;   Location: [facility] CATH LAB;  Service: Cardiovascular;  Laterality: N/A;  . PERCUTANEOUS CORONARY ROTOBLATOR INTERVENTION (PCI-R) N/A 09/20/2014   Procedure: PERCUTANEOUS CORONARY ROTOBLATOR INTERVENTION (PCI-R);  Surgeon: [name], MD;  Location: [facility] CATH LAB;  Service: Cardiovascular;  Laterality: N/A;  . POLYPECTOMY  02/28/2020   6 polyps/tics/hems   No current facility-administered medications for this encounter.   Allergies  Allergen Reactions  . Iodine Nausea And Vomiting    Turns white in face and sweaty  . Iohexol Other (See Comments)    May have caused nausea and vomiting several years ago   . Shrimp [Shellfish Allergy] Nausea And Vomiting    Turns white in face and sweaty  . Isosorbide Dinitrate Other (See Comments)    headache   Family History  Problem Relation Age of Onset  . Diabetes Father   . Heart attack Father 83  . Heart disease Father        CAD  . Cancer Mother   . Diabetes Brother   . Diabetes Brother   . Colon cancer Neg Hx   . Esophageal cancer Neg Hx   . Stomach cancer Neg Hx   . Pancreatic cancer Neg Hx   . Inflammatory bowel disease Neg Hx   . Liver disease Neg Hx   . Rectal cancer  Neg Hx   . Colon polyps Neg Hx    Social History   Socioeconomic History  . Marital status: Married    Spouse name: Not on file  . Number of children: Not on file  . Years of education: Not on file  . Highest education level: Not on file  Occupational History  . Occupation: [employer]  Tobacco Use  . Smoking status: Current Every Day Smoker    Packs/day: 1.00    Years: 51.00    Pack years: 51.00    Types: Cigarettes    Start date: 84  . Smokeless tobacco: Never Used  Vaping Use  . Vaping Use: Never used  Substance and Sexual Activity  . Alcohol use: Yes    Alcohol/week: 3.0 standard drinks    Types: 3 Cans of beer per week  . Drug use: No  . Sexual activity: Yes  Other Topics Concern  . Not on file  Social History Narrative   Married   Gets  regular exercise - walks 1/4 mile per day 5 times a week without symptoms   Social Determinants of Health   Financial Resource Strain:   . Difficulty of Paying Living Expenses: Not on file  Food Insecurity:   . Worried About Charity fundraiser in the Last Year: Not on file  . Ran Out of Food in the Last Year: Not on file  Transportation Needs:   . Lack of Transportation (Medical): Not on file  . Lack of Transportation (Non-Medical): Not on file  Physical Activity:   . Days of Exercise per Week: Not on file  . Minutes of Exercise per Session: Not on file  Stress:   . Feeling of Stress : Not on file  Social Connections:   . Frequency of Communication with Friends and Family: Not on file  . Frequency of Social Gatherings with Friends and Family: Not on file  . Attends Religious Services: Not on file  . Active Member of Clubs or Organizations: Not on file  . Attends Archivist Meetings: Not on file  . Marital Status: Not on file  Intimate Partner Violence:   . Fear of Current or Ex-Partner: Not on file  . Emotionally Abused: Not on file  . Physically Abused: Not on file  . Sexually Abused: Not on file    Physical Exam: Vital signs in last 24 hours:     GEN: NAD EYE: Sclerae anicteric ENT: MMM CV: Non-tachycardic GI: Soft, NT/ND NEURO:  Alert & Oriented x 3  Lab Results: No results for input(s): WBC, HGB, HCT, PLT in the last 72 hours. BMET No results for input(s): NA, K, CL, CO2, GLUCOSE, BUN, CREATININE, CALCIUM in the last 72 hours. LFT No results for input(s): PROT, ALBUMIN, AST, ALT, ALKPHOS, BILITOT, BILIDIR, IBILI in the last 72 hours. PT/INR No results for input(s): LABPROT, INR in the last 72 hours.   Impression / Plan: This is a 69 y.o.male who presents for Colonoscopy with attempt at EMR of cecal polyp and  polyp.  The risks and benefits of endoscopic evaluation were discussed with the patient; these include but are not limited to the risk of  perforation, infection, bleeding, missed lesions, lack of diagnosis, severe illness requiring hospitalization, as well as anesthesia and sedation related illnesses.  The patient is agreeable to proceed.    Justice Britain, MD [facility] Advanced [facility]oscopy Office # [phone]

## 2020-05-08 NOTE — Anesthesia Procedure Notes (Signed)
Procedure Name: MAC Date/Time: 05/08/2020 9:54 AM Performed by: Reece Agar, CRNA Pre-anesthesia Checklist: Patient identified, Emergency Drugs available, Suction available, Patient being monitored and Timeout performed Patient Re-evaluated:Patient Re-evaluated prior to induction Oxygen Delivery Method: Simple face mask

## 2020-05-08 NOTE — Anesthesia Preprocedure Evaluation (Signed)
Anesthesia Evaluation  Patient identified by MRN, date of birth, ID band Patient awake    Reviewed: Allergy & Precautions, H&P , NPO status , Patient's Chart, lab work & pertinent test results  Airway Mallampati: II   Neck ROM: full    Dental   Pulmonary COPD, Current Smoker and Patient abstained from smoking.,    breath sounds clear to auscultation       Cardiovascular hypertension, + angina + CAD and + CABG   Rhythm:regular Rate:Normal     Neuro/Psych Anxiety  Neuromuscular disease    GI/Hepatic hiatal hernia, GERD  ,  Endo/Other    Renal/GU      Musculoskeletal  (+) Arthritis ,   Abdominal   Peds  Hematology   Anesthesia Other Findings   Reproductive/Obstetrics                             Anesthesia Physical Anesthesia Plan  ASA: III  Anesthesia Plan: MAC   Post-op Pain Management:    Induction: Intravenous  PONV Risk Score and Plan: 1 and Propofol infusion and Treatment may vary due to age or medical condition  Airway Management Planned: Nasal Cannula  Additional Equipment:   Intra-op Plan:   Post-operative Plan:   Informed Consent: I have reviewed the patients History and Physical, chart, labs and discussed the procedure including the risks, benefits and alternatives for the proposed anesthesia with the patient or authorized representative who has indicated his/her understanding and acceptance.       Plan Discussed with: CRNA, Anesthesiologist and Surgeon  Anesthesia Plan Comments:         Anesthesia Quick Evaluation

## 2020-05-08 NOTE — Discharge Instructions (Signed)

## 2020-05-08 NOTE — Op Note (Signed)
[facility] Patient Name: Derrick Stokes Procedure Date : 05/08/2020 MRN: [id number] Attending MD: Justice Britain , MD Date of Birth: Dec 08, 1950 CSN: [id number] Age: 69 Admit Type: Inpatient Procedure:                Colonoscopy Indications:              Excision of colonic polyp Providers:                Justice Britain, MD, Jeanella Cara, RN,                            Ladona Ridgel, Technician Referring MD:             [name]. [name] Medicines:                Monitored Anesthesia Care Complications:            No immediate complications. Estimated Blood Loss:     Estimated blood loss was minimal. Procedure:                Pre-Anesthesia Assessment:                           - Prior to the procedure, a History and Physical                            was performed, and patient medications and                            allergies were reviewed. The patient's tolerance of                            previous anesthesia was also reviewed. The risks                            and benefits of the procedure and the sedation                            options and risks were discussed with the patient.                            All questions were answered, and informed consent                            was obtained. Prior Anticoagulants: The patient has                            taken Plavix (clopidogrel), last dose was 5 days                            prior to procedure. ASA Grade Assessment: II - A                            patient with mild systemic disease. After reviewing  the risks and benefits, the patient was deemed in                            satisfactory condition to undergo the procedure.                           After obtaining informed consent, the colonoscope                            was passed under direct vision. Throughout the                            procedure, the patient's blood pressure, pulse, and                             oxygen saturations were monitored continuously. The                            CF-HQ190L ([id number]) Olympus colonoscope was                            introduced through the anus and advanced to the 5                            cm into the ileum. The colonoscopy was performed                            without difficulty. The patient tolerated the                            procedure. The quality of the bowel preparation was                            good. The terminal ileum, ileocecal valve,                            appendiceal orifice, and rectum were photographed. Scope In: 10:02:30 AM Scope Out: 11:13:28 AM Scope Withdrawal Time: 1 hour 8 minutes 31 seconds  Total Procedure Duration: 1 hour 10 minutes 58 seconds  Findings:      The digital rectal exam findings include hemorrhoids. Pertinent       negatives include no palpable rectal lesions.      A 30 mm polyp was found in the cecum. The polyp was granular lateral       spreading. Preparations were made for mucosal resection. NBI Imaging and       White-light endoscopy was done to demarcate the borders of the lesion.       Orise gel was injected to raise the lesion. Piecemeal mucosal resection       using a snare was performed. Resection and retrieval were complete. To       prevent bleeding after mucosal resection, five hemostatic clips were       successfully placed (MR conditional). There was no bleeding at the end       of the procedure.      A  25 mm polyp was found in the sigmoid colon. The polyp was       pedunculated. Preparations were made for mucosal resection. NBI Imaging       and White-light endoscopy was done to demarcate the borders of the       lesion. A 1:10,000 solution of epinephrine was injected to raise the       lesion to aid in upcoming resection (total of 2 mL). A polyloop was       placed over the stalk to aid in hemostasis. Snare mucosal resection was       performed. Resection and  retrieval were complete. To prevent bleeding       after mucosal resection, two hemostatic clips were successfully placed       to the base of the stalk (MR conditional). There was no bleeding during,       or at the end, of the procedure.      Two sessile polyps were found in the distal rectum. The polyps were 2 to       3 mm in size. These polyps were removed with a cold snare. Resection and       retrieval were complete. One of the rectal polyp sites continued to ooze       for nearly 10 minutes including with pressure being applied. Decision       made to prevent further bleeding after the polypectomy, three hemostatic       clips were successfully placed (MR conditional) to that site. There was       no bleeding at the end of the procedure.      Multiple small-mouthed diverticula were found in the recto-sigmoid       colon, sigmoid colon and descending colon.      Normal mucosa was found in the entire colon otherwise.      Non-bleeding non-thrombosed external and internal hemorrhoids were found       during retroflexion, during perianal exam and during digital exam. The       hemorrhoids were Grade II (internal hemorrhoids that prolapse but reduce       spontaneously). Impression:               - Hemorrhoids found on digital rectal exam.                           - One 30 mm polyp in the cecum, removed with                            piecemeal cold mucosal resection. Resected and                            retrieved. Clips (MR conditional) were placed.                           - One 25 mm polyp in the sigmoid colon, removed                            after injection with Epinephrine and Polyloop                            placement with mucosal resection. Resected and  retrieved. Clips (MR conditional) were placed.                           - Two 2 to 3 mm polyps in the distal rectum,                            removed with a cold snare. Resected and  retrieved.                            1 polyp site in distal rectum continued to ooze                            after 10 minutes and clips (MR conditional) were                            placed.                           - Diverticulosis in the recto-sigmoid colon, in the                            sigmoid colon and in the descending colon.                           - Normal mucosa in the entire examined colon                            otherwise.                           - Non-bleeding non-thrombosed external and internal                            hemorrhoids. Recommendation:           - The patient will be observed post-procedure,                            until all discharge criteria are met.                           - Discharge patient to home.                           - Patient has a contact number available for                            emergencies. The signs and symptoms of potential                            delayed complications were discussed with the                            patient. Return to normal activities tomorrow.  Written discharge instructions were provided to the                            patient.                           - Resume previous diet.                           - May restart Aspirin tomorrow.                           - May restart Plavix on 10/23 (72 hours post                            procedure) to decrease risk of post-interventional                            bleeding.                           - Continue present medications otherwise.                           - No ibuprofen, naproxen, or other non-steroidal                            anti-inflammatory drugs for 2 weeks to further                            decrease risk of bleeding post-intervention.                           - I expect a bit of spotting to occur over the                            course of the next 24 hours. However, long                             discussion with family to monitor for                            signs/symptoms of significant bleeding,                            perforation, and infection. If issues please call                            our number to get further assistance as needed.                           - Await pathology results.                           - Repeat colonoscopy in 6-9 months for surveillance  of piecemeal resection site.                           - The findings and recommendations were discussed                            with the patient.                           - The findings and recommendations were discussed                            with the patient's family. Procedure Code(s):        --- Professional ---                           [phone], Colonoscopy, flexible; with endoscopic                            mucosal resection                           [id number], 45, Colonoscopy, flexible; with removal of                            tumor(s), polyp(s), or other lesion(s) by snare                            technique Diagnosis Code(s):        --- Professional ---                           K64.1, Second degree hemorrhoids                           K63.5, Polyp of colon                           K62.1, Rectal polyp                           K57.30, Diverticulosis of large intestine without                            perforation or abscess without bleeding CPT copyright 2019 American Medical Association. All rights reserved. The codes documented in this report are preliminary and upon coder review may  be revised to meet current compliance requirements. Justice Britain, MD 05/08/2020 11:42:21 AM Number of Addenda: 0

## 2020-05-08 NOTE — Transfer of Care (Signed)
Immediate Anesthesia Transfer of Care Note  Patient: Derrick Stokes  Procedure(s) Performed: COLONOSCOPY WITH PROPOFOL (N/A ) ENDOSCOPIC MUCOSAL RESECTION (N/A ) SUBMUCOSAL LIFTING INJECTION HEMOSTASIS CLIP PLACEMENT SCLEROTHERAPY HEMOSTASIS CONTROL  Patient Location: Endoscopy Unit  Anesthesia Type:MAC  Level of Consciousness: lethargic and responds to stimulation  Airway & Oxygen Therapy: Patient Spontanous Breathing and Patient connected to face mask oxygen  Post-op Assessment: Report given to RN  Post vital signs: Reviewed and stable  Last Vitals:  Vitals Value Taken Time  BP 114/54 05/08/20 1124  Temp    Pulse 63 05/08/20 1125  Resp 24 05/08/20 1125  SpO2 98 % 05/08/20 1125  Vitals shown include unvalidated device data.  Last Pain:  Vitals:   05/08/20 0906  TempSrc: Oral  PainSc: 0-No pain         Complications: No complications documented.

## 2020-05-09 ENCOUNTER — Encounter (HOSPITAL_COMMUNITY): Payer: Self-pay | Admitting: Gastroenterology

## 2020-05-10 ENCOUNTER — Encounter (HOSPITAL_COMMUNITY): Payer: Self-pay | Admitting: Gastroenterology

## 2020-05-10 ENCOUNTER — Encounter: Payer: Self-pay | Admitting: Gastroenterology

## 2020-05-10 LAB — SURGICAL PATHOLOGY

## 2020-05-10 NOTE — Anesthesia Postprocedure Evaluation (Signed)
Anesthesia Post Note  Patient: Derrick Stokes  Procedure(s) Performed: COLONOSCOPY WITH PROPOFOL (N/A ) ENDOSCOPIC MUCOSAL RESECTION (N/A ) SUBMUCOSAL LIFTING INJECTION HEMOSTASIS CLIP PLACEMENT SCLEROTHERAPY HEMOSTASIS CONTROL POLYPECTOMY     Patient location during evaluation: Endoscopy Anesthesia Type: MAC Level of consciousness: awake and alert Pain management: pain level controlled Vital Signs Assessment: post-procedure vital signs reviewed and stable Respiratory status: spontaneous breathing, nonlabored ventilation, respiratory function stable and patient connected to nasal cannula oxygen Cardiovascular status: blood pressure returned to baseline and stable Postop Assessment: no apparent nausea or vomiting Anesthetic complications: no   No complications documented.  Last Vitals:  Vitals:   05/08/20 1148 05/08/20 1154  BP: 100/65 105/73  Pulse: 72 70  Resp: (!) 24 20  Temp:    SpO2: 94% 95%    Last Pain:  Vitals:   05/08/20 1154  TempSrc:   PainSc: 0-No pain                 Kenzley Ke S

## 2020-05-28 ENCOUNTER — Telehealth: Payer: Self-pay | Admitting: Family Medicine

## 2020-05-28 NOTE — Telephone Encounter (Signed)
Left message for patient to call back and schedule Medicare Annual Wellness Visit (AWV) either virtually or in office.  NO HX; please schedule at anytime with [facility] Nurse Health Advisor 2.  This should be a 45 minute visit.

## 2020-05-29 ENCOUNTER — Other Ambulatory Visit: Payer: Self-pay | Admitting: Cardiovascular Disease

## 2020-08-09 ENCOUNTER — Other Ambulatory Visit: Payer: Self-pay | Admitting: Cardiovascular Disease

## 2020-08-16 DIAGNOSIS — G8929 Other chronic pain: Secondary | ICD-10-CM | POA: Diagnosis not present

## 2020-08-16 DIAGNOSIS — Z7982 Long term (current) use of aspirin: Secondary | ICD-10-CM | POA: Diagnosis not present

## 2020-08-16 DIAGNOSIS — I10 Essential (primary) hypertension: Secondary | ICD-10-CM | POA: Diagnosis not present

## 2020-08-16 DIAGNOSIS — G629 Polyneuropathy, unspecified: Secondary | ICD-10-CM | POA: Diagnosis not present

## 2020-08-16 DIAGNOSIS — K219 Gastro-esophageal reflux disease without esophagitis: Secondary | ICD-10-CM | POA: Diagnosis not present

## 2020-08-16 DIAGNOSIS — R69 Illness, unspecified: Secondary | ICD-10-CM | POA: Diagnosis not present

## 2020-08-16 DIAGNOSIS — E785 Hyperlipidemia, unspecified: Secondary | ICD-10-CM | POA: Diagnosis not present

## 2020-08-16 DIAGNOSIS — Z7902 Long term (current) use of antithrombotics/antiplatelets: Secondary | ICD-10-CM | POA: Diagnosis not present

## 2020-08-16 DIAGNOSIS — I25119 Atherosclerotic heart disease of native coronary artery with unspecified angina pectoris: Secondary | ICD-10-CM | POA: Diagnosis not present

## 2020-08-16 DIAGNOSIS — J449 Chronic obstructive pulmonary disease, unspecified: Secondary | ICD-10-CM | POA: Diagnosis not present

## 2020-08-22 IMAGING — CT CT CHEST LCS NODULE FOLLOW-UP W/O CM
1 of 2 series · 10 of 20 positions shown, 13 images · non-contrast
Comparison: 08/16/2019

CLINICAL DATA: Lung cancer screening. Fifty pack-year history.
Current asymptomatic smoker.

EXAM:
CT CHEST WITHOUT CONTRAST FOR LUNG CANCER SCREENING NODULE FOLLOW-UP
TECHNIQUE: Multidetector CT imaging of the chest was performed following the
standard protocol without IV contrast.

[ct lung segmentation data · axial · 0.79mm/px · z∈[-392,-392]mm · 10 of 324 frames shown]
[frame 1/324  mediastinal]
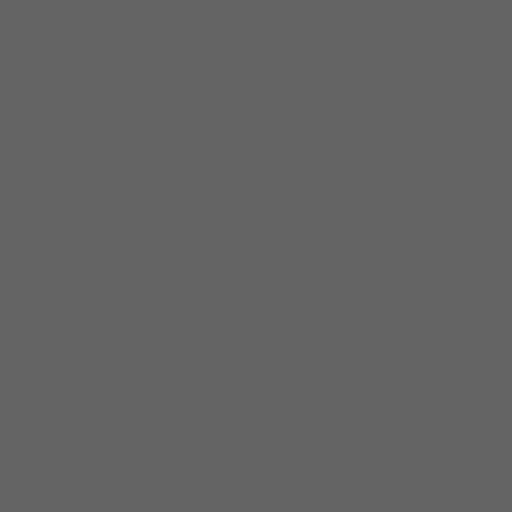
[frame 1/324  lung]
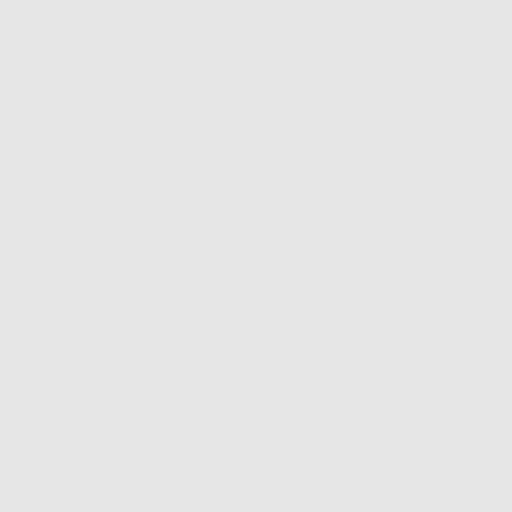
[frame 36/324  lung]
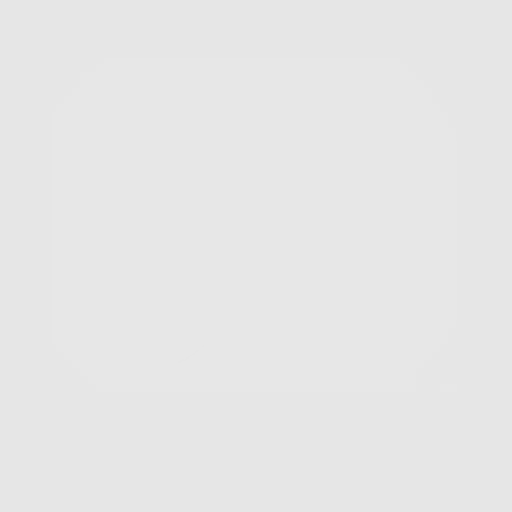
[frame 72/324  lung]
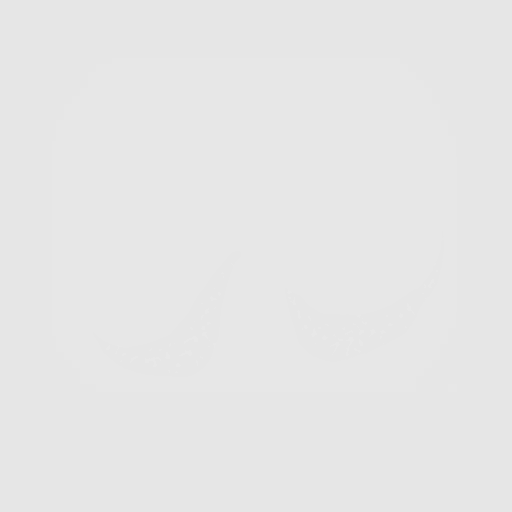
[frame 108/324  lung]
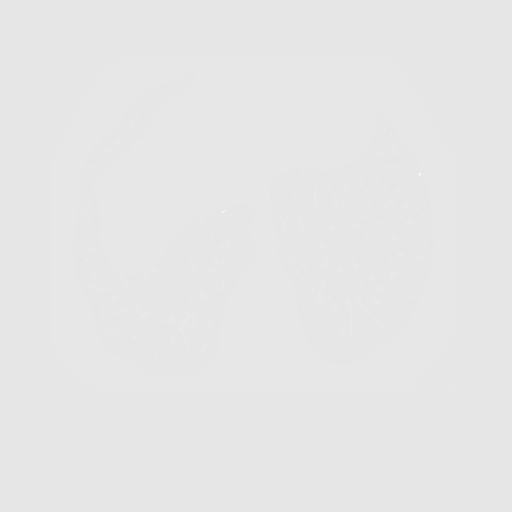
[frame 144/324  mediastinal]
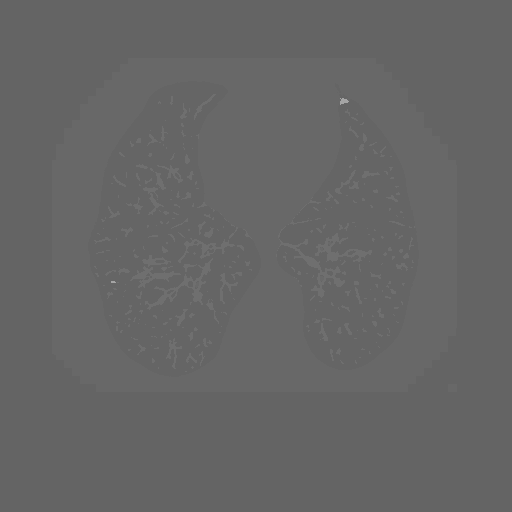
[frame 144/324  lung]
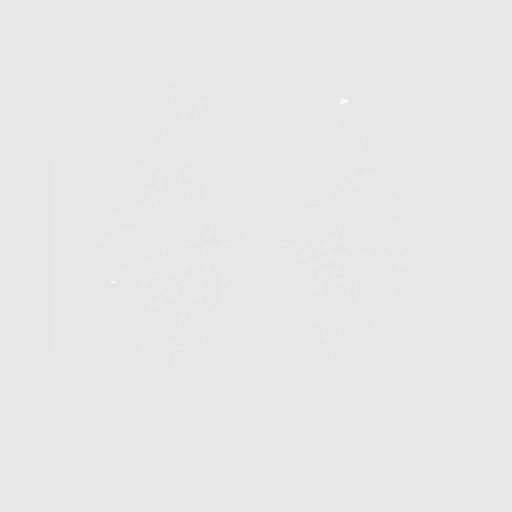
[frame 180/324  lung]
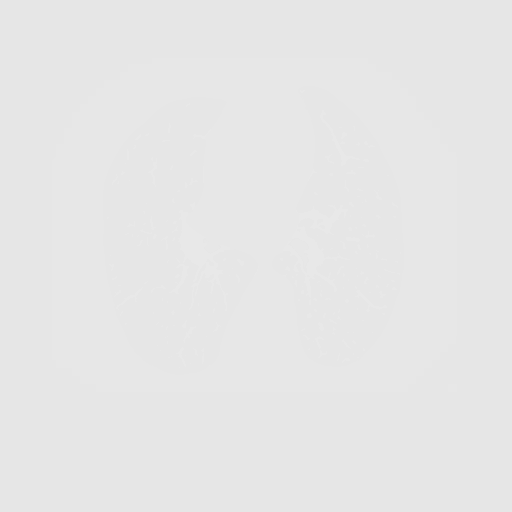
[frame 216/324  lung]
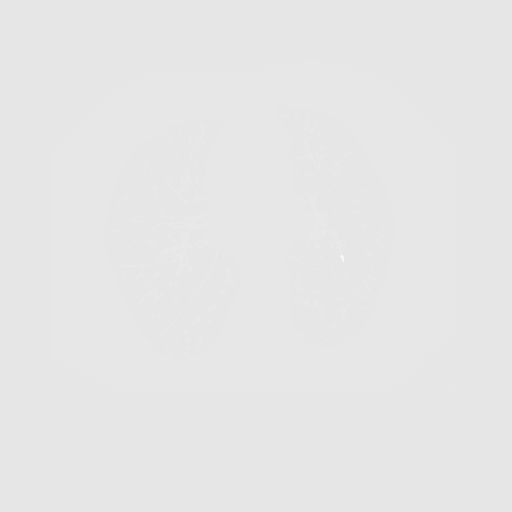
[frame 252/324  lung]
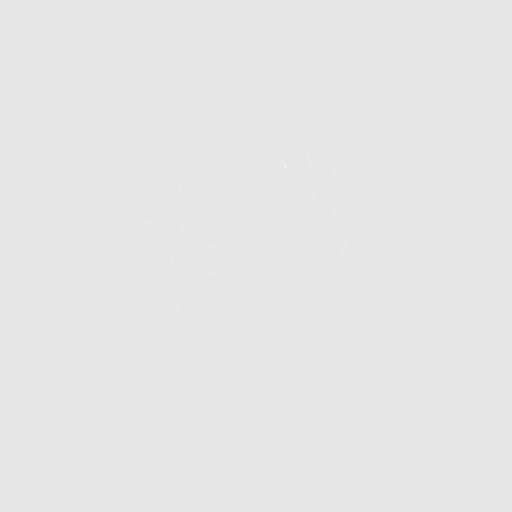
[frame 288/324  mediastinal]
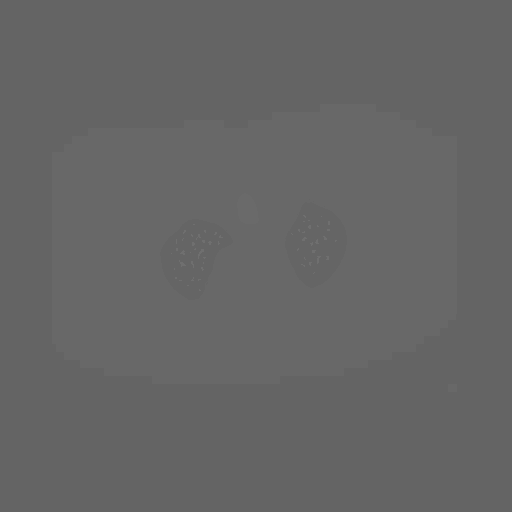
[frame 288/324  lung]
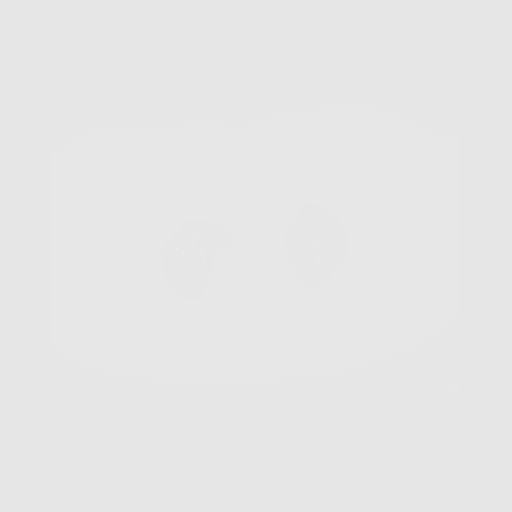
[frame 324/324  lung]
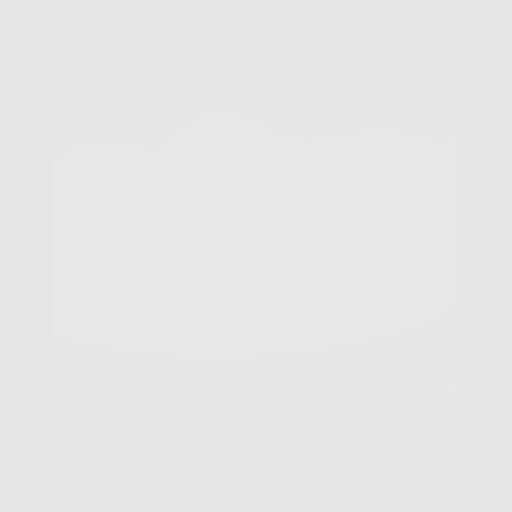

[10 of 20 positions shown; findings below may reference images not displayed]

FINDINGS: Cardiovascular: Normal heart size. No pericardial effusion
identified. Aortic atherosclerosis. Three vessel coronary artery
atherosclerotic calcifications. Previous median sternotomy and CABG.

Mediastinum/Nodes: Normal appearance of the thyroid gland. The
trachea appears patent and is midline. Normal appearance of the
esophagus.

No enlarged mediastinal or axillary lymph nodes. The hilar lymph
nodes are suboptimally evaluated due to lack of IV contrast
material.

Lungs/Pleura: No pleural effusion. Mild centrilobular and paraseptal
emphysema. Diffuse bronchial wall thickening.

Again seen are multiple small pulmonary nodules. The largest lung
nodule is in the left lung base with an equivalent diameter of
mm. These are all stable when compared with the previous exam. No
new suspicious lung nodules.

Upper Abdomen: No acute abnormality.  Aortic atherosclerosis noted.

Musculoskeletal: Spondylosis identified. No acute or suspicious
osseous findings identified at this time. Multi level degenerative
disc disease is identified within the thoracic spine. Multiple mild
compression deformities are also noted which are unchanged from the
previous study. No acute or suspicious osseous findings.
IMPRESSION: 1. Lung-RADS 2, benign appearance or behavior. Continue annual
screening with low-dose chest CT without contrast in 12 months.
2. Emphysema and aortic atherosclerosis. Three vessel coronary
artery calcifications noted.

Aortic Atherosclerosis (NB1E7-0KE.E) and Emphysema (NB1E7-256.5).

## 2020-09-02 ENCOUNTER — Telehealth: Payer: Self-pay | Admitting: Family Medicine

## 2020-09-02 NOTE — Telephone Encounter (Signed)
Left message for patient to call back and schedule Medicare Annual Wellness Visit (AWV) either virtually or in office. No detailed information left  Last AWVI  No information  please schedule at anytime with [facility] Nurse Health Advisor 1 or 2   This should be a 45 minute visit.

## 2020-09-20 ENCOUNTER — Ambulatory Visit (INDEPENDENT_AMBULATORY_CARE_PROVIDER_SITE_OTHER): Payer: Medicare HMO | Admitting: Family Medicine

## 2020-09-20 ENCOUNTER — Telehealth: Payer: Self-pay | Admitting: Cardiovascular Disease

## 2020-09-20 ENCOUNTER — Other Ambulatory Visit: Payer: Self-pay

## 2020-09-20 ENCOUNTER — Encounter: Payer: Self-pay | Admitting: Family Medicine

## 2020-09-20 VITALS — BP 124/80 | HR 60 | Temp 97.7°F | Resp 16 | Ht 68.0 in | Wt 178.0 lb

## 2020-09-20 DIAGNOSIS — Z720 Tobacco use: Secondary | ICD-10-CM

## 2020-09-20 DIAGNOSIS — K219 Gastro-esophageal reflux disease without esophagitis: Secondary | ICD-10-CM

## 2020-09-20 DIAGNOSIS — E785 Hyperlipidemia, unspecified: Secondary | ICD-10-CM

## 2020-09-20 DIAGNOSIS — E538 Deficiency of other specified B group vitamins: Secondary | ICD-10-CM | POA: Diagnosis not present

## 2020-09-20 DIAGNOSIS — R079 Chest pain, unspecified: Secondary | ICD-10-CM

## 2020-09-20 DIAGNOSIS — Z0000 Encounter for general adult medical examination without abnormal findings: Secondary | ICD-10-CM | POA: Diagnosis not present

## 2020-09-20 DIAGNOSIS — H9193 Unspecified hearing loss, bilateral: Secondary | ICD-10-CM

## 2020-09-20 DIAGNOSIS — I25119 Atherosclerotic heart disease of native coronary artery with unspecified angina pectoris: Secondary | ICD-10-CM | POA: Diagnosis not present

## 2020-09-20 LAB — LIPID PANEL
Cholesterol: 139 mg/dL (ref 0–200)
HDL: 43.2 mg/dL (ref >39.00)
LDL Cholesterol: 75 mg/dL (ref 0–99)
NonHDL: 95.72
Total CHOL/HDL Ratio: 3
Triglycerides: 104 mg/dL (ref 0.0–149.0)
VLDL: 20.8 mg/dL (ref 0.0–40.0)

## 2020-09-20 LAB — VITAMIN B12: Vitamin B-12: 571 pg/mL (ref 211–911)

## 2020-09-20 LAB — BASIC METABOLIC PANEL
BUN: 15 mg/dL (ref 6–23)
CO2: 27 mEq/L (ref 19–32)
Calcium: 9.4 mg/dL (ref 8.4–10.5)
Chloride: 103 mEq/L (ref 96–112)
Creatinine, Ser: 0.79 mg/dL (ref 0.40–1.50)
GFR: 91.61 mL/min (ref >60.00)
Glucose, Bld: 83 mg/dL (ref 70–99)
Potassium: 4.5 mEq/L (ref 3.5–5.1)
Sodium: 139 mEq/L (ref 135–145)

## 2020-09-20 NOTE — Patient Instructions (Addendum)
  Mr. Dupras , Thank you for taking time to come for your Medicare Wellness Visit. I appreciate your ongoing commitment to your health goals. Please review the following plan we discussed and let me know if I can assist you in the future.   These are the goals we discussed: Goals    .  Acknowledge receipt of Programme researcher, broadcasting/film/video    . Quit Smoking       This is a list of the screening recommended for you and due dates:  Health Maintenance  Topic Date Due  . Urine Protein Check  Never done  . COVID-19 Vaccine (3 - Booster for Moderna series) 10/06/2020*  . Pneumonia vaccines (2 of 2 - PPSV23) 06/08/2021  . Tetanus Vaccine  05/01/2024  . Colon Cancer Screening  05/08/2030  . Flu Shot  Completed  .  Hepatitis C: One time screening is recommended by Center for Disease Control  (CDC) for  adults born from [birth year] through 1963.   Completed  . HPV Vaccine  Aged Out  *Topic was postponed. The date shown is not the original due date.   A few tips:  -As we age balance is not as good as it was, so there is a higher risks for falls. Please remove small rugs and furniture that is "in your way" and could increase the risk of falls. Stretching exercises may help with fall prevention: Yoga and Tai Chi are some examples. Low impact exercise is better, so you are not very achy the next day.  -Sun screen and avoidance of direct sun light recommended. Caution with dehydration, if working outdoors be sure to drink enough fluids.  - Some medications are not safe as we age, increases the risk of side effects and can potentially interact with other medication you are also taken;  including some of over the counter medications. Be sure to let me know when you start a new medication even if it is a dietary/vitamin supplement.   -Healthy diet low in red meet/animal fat and sugar + regular physical activity is recommended.    A few things to remember from today's visit:   Medicare annual wellness visit,  subsequent  Hyperlipidemia, unspecified hyperlipidemia type - Plan: Basic metabolic panel, Lipid panel  B12 deficiency - Plan: Vitamin B12  Coronary artery disease involving native coronary artery of native heart with angina pectoris ([place]) - Plan: Lipid panel  Chest pain, unspecified type - Plan: EKG 12-Lead  Bilateral hearing loss, unspecified hearing loss type - Plan: Ambulatory referral to Audiology  If you need refills please call your pharmacy. Do not use My Chart to request refills or for acute issues that need immediate attention.  Smoking cessation is a MUST. Avoid exertion. Appt with Dr Burt Knack will be arranged. If chest pain take nitro if not better clal 911.   Your heart rate is mildly low today. For now no changes in Metoprolol dose. Continue monitoring blood pressure and pulse at home.  Please be sure medication list is accurate. If a new problem present, please set up appointment sooner than planned today.

## 2020-09-20 NOTE — Progress Notes (Signed)
HPI: [name] is a 70 y.o. male, who is here today with his wife for AWV and follow up.   He was last seen on 09/18/19. He lives with his wife.  No falls in the past year and denies depression symptoms. 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  Functional Status Survey: Is the patient deaf or have difficulty hearing?: Yes (Hearing loss getting worse.) Does the patient have difficulty seeing, even when wearing glasses/contacts?: No Does the patient have difficulty concentrating, remembering, or making decisions?: No Does the patient have difficulty walking or climbing stairs?: No Does the patient have difficulty dressing or bathing?: No Does the patient have difficulty doing errands alone such as visiting a doctor's office or shopping?: No  He does not drive, his wife does usually.  Fall Risk  09/20/2020  Falls in the past year? 0  Number falls in past yr: 0  Injury with Fall? 0  Follow up Education provided   Providers she sees regularly: Cardiologist: Dr Burt Knack., bilateral. Eye care provider: Dr Valetta Close, open angle glaucoma GI: Dr Rush Landmark.  Depression screen [facility] 2/9 09/20/2020  Decreased Interest 0  Down, Depressed, Hopeless 0  PHQ - 2 Score 0    Mini-Cog - 09/20/20 0807    Normal clock drawing test? yes    How many words correct? 3          Hearing loss for years and getting worse. Bilateral ear fullness sensation. His wife gave him a hearing aid but he does not use it. Negative for recent URI or travel. No earache or ear drainage.   Hearing Screening   '125Hz'  '250Hz'  '500Hz'  '1000Hz'  '2000Hz'  '3000Hz'  '4000Hz'  '6000Hz'  '8000Hz'   Right ear:           Left ear:           Comments: Abnormal whisper hearing,bilateral.   Visual Acuity Screening   Right eye Left eye Both eyes  Without correction: '20/20 20/20 20/20 '  With correction:      He is not exercising regularly. In general he follows a healthful diet. His wife cooks most of the time.  Sleeping  7 hours and taking naps.   His wife is reporting that last night he told her that he was having episodes of left-sided chest pain for a few weeks now, he is not sure for how long. He walks fast back and fouth from his workshop and he has no chest pain.  Sometimes he has CP at rest. Radiated to LUE, last from seconds to minutes. He has nitroglycerine tabs but has not taken any during episodes.  He denies associated palpitation,SOB,diaphoresis,and dizziness. No as bad as it was in the past. He has not seen cardiologist in over a year, Dr Burt Knack. Still smoking.  CAD on Plavix 75 mg daily, metoprolol tartrate 25 mg twice daily, and Aspirin 81 mg daily. Left cardiac cath and coronary angiography on 03/17/2018:  Previously placed Prox RCA to Mid RCA stent (unknown type) is widely patent.  Ost LAD lesion is 100% stenosed.  Origin to Prox Graft lesion is 100% stenosed.  Origin lesion is 100% stenosed.  Ost Cx to Mid Cx lesion is 50% stenosed.  The left ventricular systolic function is normal.  LV end diastolic pressure is normal.  The left ventricular ejection fraction is 55-65% by visual estimate.  Echo on 05/02/2015: LVEF 50 to 16%, grade 1 diastolic dysfunction.  HLD: He is on atorvastatin 80 mg daily.  Lab Results  Component Value Date   CHOL 163 02/05/2020   HDL 39 (L) 02/05/2020   LDLCALC 101 (H) 02/05/2020   LDLDIRECT 68.0 11/19/2014   TRIG 125 02/05/2020   CHOLHDL 4.2 02/05/2020   B12 deficiency: Currently he is on B12 1000 mcg daily.  Lab Results  Component Value Date   [id number] 09/14/2018   GERD.  He is on Protonix 40 mg daily, he starts having symptoms if he skips medication.  Review of Systems  Constitutional: Negative for activity change, appetite change, fatigue and fever.  HENT: Positive for hearing loss. Negative for sore throat and trouble swallowing.   Eyes: Negative for redness and visual disturbance.  Respiratory: Negative for cough and  wheezing.   Gastrointestinal: Negative for abdominal pain, nausea and vomiting.  Genitourinary: Negative for decreased urine volume, dysuria and hematuria.  Musculoskeletal: Negative for gait problem and myalgias.  Allergic/Immunologic: Negative for environmental allergies.  Neurological: Negative for syncope, weakness and headaches.  Psychiatric/Behavioral: Negative for behavioral problems and confusion.  Rest of ROS, see pertinent positives sand negatives in HPI  Current Outpatient Medications on File Prior to Visit  Medication Sig Dispense Refill  . aspirin EC 81 MG tablet Take 81 mg by mouth at bedtime.     [name] atorvastatin (LIPITOR) 80 MG tablet TAKE 1 TABLET (80 MG TOTAL) BY MOUTH DAILY AT 6 PM. 90 tablet 2  . clopidogrel (PLAVIX) 75 MG tablet TAKE 1 TABLET BY MOUTH EVERY DAY WITH BREAKFAST 90 tablet 1  . metoprolol tartrate (LOPRESSOR) 25 MG tablet Take 1 tablet (25 mg total) by mouth 2 (two) times daily. 180 tablet 3  . nitroGLYCERIN (NITROSTAT) 0.4 MG SL tablet PLACE 1 TABLET UNDER THE TONGUE EVERY 5 MINUTES X 3 DOSES AS NEEDED FOR CHEST PAIN. 25 tablet 3  . Omega-3 Fatty Acids (FISH OIL) 1000 MG CPDR Take 1,000 mg by mouth daily.     . ondansetron (ZOFRAN ODT) 8 MG disintegrating tablet Take 1 tablet (8 mg total) by mouth every 8 (eight) hours as needed for nausea or vomiting. 30 tablet 0  . pantoprazole (PROTONIX) 40 MG tablet TAKE 1 TABLET BY MOUTH EVERY DAY 90 tablet 1  . vitamin B-12 (CYANOCOBALAMIN) 1000 MCG tablet Take 1,000 mcg by mouth daily.      No current facility-administered medications on file prior to visit.   Past Medical History:  Diagnosis Date  . Arthritis   . B12 deficiency    Borderline  . CAD (coronary artery disease)    a. s/p CABG 01/2011;  b. ETT Myoview 3/14:  Low risk;  c. 09/2014 Cath/PCI: LM nl, LAD 100p, LCX 14mOM2 60-70p (2.75x28 Synergy DES), RCA dom, 842matherectomy, 3.5x24 Synergy DES), PDA nl, LIMA->LAD nl, VG->Diag nl, VG->OM 100, VG->RCA 100.   . Cataract    bilateral sx   . COPD (chronic obstructive pulmonary disease) [place]  . GERD (gastroesophageal reflux disease)    on meds  . Hiatal hernia   . History of echocardiogram    Echo 10/16: EF 50-55%, no RWMA, Gr 1 DD, mild MR, mild TR  . History of kidney stones    Passed  . HLD (hyperlipidemia)    on meds  . HOH (hard of hearing)   . Hypertension    on meds  . Leg pain    ABIs 4/14:  R 1.2, L 1.2, TBIs normal  . Peripheral neuropathy    From trauma  . Reactive airway disease 03/18/2018  . Tobacco abuse  Allergies  Allergen Reactions  . Iodine Nausea And Vomiting    Turns white in face and sweaty  . Iohexol Other (See Comments)    May have caused nausea and vomiting several years ago   . Shrimp [Shellfish Allergy] Nausea And Vomiting    Turns white in face and sweaty  . Isosorbide Dinitrate Other (See Comments)    headache    Social History   Socioeconomic History  . Marital status: Married    Spouse name: Not on file  . Number of children: Not on file  . Years of education: Not on file  . Highest education level: Not on file  Occupational History  . Occupation: Diasability  Tobacco Use  . Smoking status: Current Every Day Smoker    Packs/day: 1.00    Years: 51.00    Pack years: 51.00    Types: Cigarettes    Start date: 7  . Smokeless tobacco: Never Used  Vaping Use  . Vaping Use: Never used  Substance and Sexual Activity  . Alcohol use: Yes    Alcohol/week: 3.0 standard drinks    Types: 3 Cans of beer per week  . Drug use: No  . Sexual activity: Yes  Other Topics Concern  . Not on file  Social History Narrative   Married   Gets regular exercise - walks 1/4 mile per day 5 times a week without symptoms   Social Determinants of Radio broadcast assistant Strain: Not on file  Food Insecurity: Not on file  Transportation Needs: Not on file  Physical Activity: Not on file  Stress: Not on file  Social Connections: Not on file    Vitals:   09/20/20 0731  BP: 124/80  Pulse: 60  Resp: 16  Temp: 97.7 F (36.5 C)  SpO2: 98%   Body mass index is 27.06 kg/m.  Physical Exam Vitals and nursing note reviewed.  Constitutional:      General: He is not in acute distress.    Appearance: He is well-developed.  HENT:     Head: Normocephalic and atraumatic.     Right Ear: Tympanic membrane, ear canal and external ear normal. Decreased hearing noted.     Left Ear: Tympanic membrane, ear canal and external ear normal. Decreased hearing noted.     Mouth/Throat:     Comments: Missing teeth. Eyes:     Conjunctiva/sclera: Conjunctivae normal.  Cardiovascular:     Rate and Rhythm: Normal rate and regular rhythm.     Pulses:          Dorsalis pedis pulses are 2+ on the right side and 2+ on the left side.     Heart sounds: No murmur heard.   Pulmonary:     Effort: Pulmonary effort is normal. No respiratory distress.     Breath sounds: Normal breath sounds.  Abdominal:     Palpations: Abdomen is soft. There is no hepatomegaly or mass.     Tenderness: There is no abdominal tenderness.  Lymphadenopathy:     Cervical: No cervical adenopathy.  Skin:    General: Skin is warm.     Findings: No erythema or rash.  Neurological:     Mental Status: He is alert and oriented to person, place, and time.     Cranial Nerves: No cranial nerve deficit.     Gait: Gait normal.  Psychiatric:     Comments: Well groomed, good eye contact.   ASSESSMENT AND PLAN:  [name] was  seen today for AWV and follow-up.  Orders Placed This Encounter  Procedures  . Basic metabolic panel  . Lipid panel  . Vitamin B12  . Ambulatory referral to Audiology  . EKG 12-Lead   Lab Results  Component Value Date   CHOL 139 09/20/2020   HDL 43.20 09/20/2020   LDLCALC 75 09/20/2020   LDLDIRECT 68.0 11/19/2014   TRIG 104.0 09/20/2020   CHOLHDL 3 09/20/2020   Lab Results  Component Value Date   CREATININE 0.79 09/20/2020   BUN  15 09/20/2020   NA 139 09/20/2020   K 4.5 09/20/2020   CL 103 09/20/2020   CO2 27 09/20/2020   Lab Results  Component Value Date   VITAMINB12 571 09/20/2020   Estimated Creatinine Clearance: 85.5 mL/min (by C-G formula based on SCr of 0.79 mg/dL).  Medicare annual wellness visit, subsequent We discussed the importance of staying active, physically and mentally, as well as the benefits of a healthy/balnace diet. Low impact exercise that involve stretching and strengthing are ideal. Vaccines: Up to date, will be due for pneumovax in 05/2021. We discussed preventive screening for the next 5-10 years, summery of recommendations given in AVS. Fall prevention. Low chest CT 02/15/20. Abdominal AA screening: 09/27/18 negative. Colonoscopy on 05/08/20. Advance directives and end of life discussed, he does not have POA or living will, package given.  Chest pain, unspecified type We discussed possible etiologies. Hx suggest angina. EKG today: Sinus bradycardia (? Sinus arrhythmia), voltage criteria for LVH, normal axis, ? IVCD. No significant changes when compared with EKG 01/19/20 except for bradycardia. Continue Metoprolol same dose. Recommend monitoring HR at home.  He is not having pain at this time, so I do not think he needs to go to the ER but he was clearly instructed about warning signs. He has plenty of nitroglycerine SL to use as needed.  Bilateral hearing loss, unspecified hearing loss type Chronic. Most likely sensorineural. Appt with audiologist will be arranged.  B12 deficiency Continue B12 at 1000 mcg daily. Further recommendation will be given according to B12 results.  Hyperlipidemia Continue atorvastatin 80 mg daily and low fat diet. Further recommendations according to lipid panel results.  Tobacco abuse We discussed adverse effects of tobacco use as well as benefits of smoking cessation. He denied tested in pharmacologic treatment.  GERD (gastroesophageal  reflux disease) Problem is adequately controlled. Continue Protonix 40 mg daily. GERD precautions.  Coronary artery disease involving native coronary artery of native heart with angina pectoris (H[facility]) Continue metoprolol 25 mg twice daily, Plavix 75 mg daily, Aspirin 81 mg daily, and atorvastatin 80 mg daily. Smoking cessation strongly recommended. Appointment with Dr. Burt Knack has been arranged. Instructed about warning signs.   Return in about 6 months (around 03/23/2021) for cpe.  Anikah Hogge G. [name], MD  [facility]. [place] office.   Mr. Mendizabal , Thank you for taking time to come for your Medicare Wellness Visit. I appreciate your ongoing commitment to your health goals. Please review the following plan we discussed and let me know if I can assist you in the future.   These are the goals we discussed: Goals    .  Acknowledge receipt of Programme researcher, broadcasting/film/video    . Quit Smoking       This is a list of the screening recommended for you and due dates:  Health Maintenance  Topic Date Due  . Urine Protein Check  Never done  . COVID-19 Vaccine (3 - Booster for Moderna series)  10/06/2020*  . Pneumonia vaccines (2 of 2 - PPSV23) 06/08/2021  . Tetanus Vaccine  05/01/2024  . Colon Cancer Screening  05/08/2030  . Flu Shot  Completed  .  Hepatitis C: One time screening is recommended by Center for Disease Control  (CDC) for  adults born from [birth year] through 1963.   Completed  . HPV Vaccine  Aged Out  *Topic was postponed. The date shown is not the original due date.   A few tips:  -As we age balance is not as good as it was, so there is a higher risks for falls. Please remove small rugs and furniture that is "in your way" and could increase the risk of falls. Stretching exercises may help with fall prevention: Yoga and Tai Chi are some examples. Low impact exercise is better, so you are not very achy the next day.  -Sun screen and avoidance of direct sun light  recommended. Caution with dehydration, if working outdoors be sure to drink enough fluids.  - Some medications are not safe as we age, increases the risk of side effects and can potentially interact with other medication you are also taken;  including some of over the counter medications. Be sure to let me know when you start a new medication even if it is a dietary/vitamin supplement.   -Healthy diet low in red meet/animal fat and sugar + regular physical activity is recommended.    A few things to remember from today's visit:   Medicare annual wellness visit, subsequent  Hyperlipidemia, unspecified hyperlipidemia type - Plan: Basic metabolic panel, Lipid panel  B12 deficiency - Plan: Vitamin B12  Coronary artery disease involving native coronary artery of native heart with angina pectoris ([place]) - Plan: Lipid panel  Chest pain, unspecified type - Plan: EKG 12-Lead  Bilateral hearing loss, unspecified hearing loss type - Plan: Ambulatory referral to Audiology  If you need refills please call your pharmacy. Do not use My Chart to request refills or for acute issues that need immediate attention.  Smoking cessation is a MUST. Avoid exertion. Appt with Dr Burt Knack will be arranged. If chest pain take nitro if not better clal 911.   Your heart rate is mildly low today. For now no changes in Metoprolol dose. Continue monitoring blood pressure and pulse at home.  Please be sure medication list is accurate. If a new problem present, please set up appointment sooner than planned today.

## 2020-09-20 NOTE — Assessment & Plan Note (Signed)
Continue atorvastatin 80 mg daily and low fat diet. Further recommendations according to lipid panel results.

## 2020-09-20 NOTE — Assessment & Plan Note (Signed)
We discussed adverse effects of tobacco use as well as benefits of smoking cessation. He denied tested in pharmacologic treatment.

## 2020-09-20 NOTE — Telephone Encounter (Signed)
      Did not need an encounter, pt was made an appt

## 2020-09-20 NOTE — Assessment & Plan Note (Signed)
Problem is adequately controlled. Continue Protonix 40 mg daily. GERD precautions.

## 2020-09-20 NOTE — Assessment & Plan Note (Signed)
Continue B12 at 1000 mcg daily. Further recommendation will be given according to B12 results. 

## 2020-09-20 NOTE — Assessment & Plan Note (Signed)
Continue metoprolol 25 mg twice daily, Plavix 75 mg daily, Aspirin 81 mg daily, and atorvastatin 80 mg daily. Smoking cessation strongly recommended. Appointment with Dr. Burt Knack has been arranged. Instructed about warning signs.

## 2020-09-22 MED ORDER — EZETIMIBE 10 MG PO TABS
10.0000 mg | ORAL_TABLET | Freq: Every day | ORAL | 3 refills | Status: DC
Start: 2020-09-22 — End: 2020-09-25

## 2020-09-23 ENCOUNTER — Encounter: Payer: Self-pay | Admitting: Family Medicine

## 2020-09-25 ENCOUNTER — Other Ambulatory Visit: Payer: Self-pay | Admitting: Family Medicine

## 2020-10-01 DIAGNOSIS — H903 Sensorineural hearing loss, bilateral: Secondary | ICD-10-CM | POA: Diagnosis not present

## 2020-10-01 DIAGNOSIS — Z570 Occupational exposure to noise: Secondary | ICD-10-CM | POA: Diagnosis not present

## 2020-10-01 DIAGNOSIS — H9313 Tinnitus, bilateral: Secondary | ICD-10-CM | POA: Diagnosis not present

## 2020-10-07 ENCOUNTER — Ambulatory Visit: Payer: Medicare HMO | Admitting: Cardiovascular Disease

## 2020-10-07 ENCOUNTER — Encounter: Payer: Self-pay | Admitting: Cardiovascular Disease

## 2020-10-07 ENCOUNTER — Other Ambulatory Visit: Payer: Self-pay

## 2020-10-07 VITALS — BP 110/80 | HR 58 | Ht 68.0 in | Wt 178.8 lb

## 2020-10-07 DIAGNOSIS — Z720 Tobacco use: Secondary | ICD-10-CM | POA: Diagnosis not present

## 2020-10-07 DIAGNOSIS — E782 Mixed hyperlipidemia: Secondary | ICD-10-CM | POA: Diagnosis not present

## 2020-10-07 DIAGNOSIS — I10 Essential (primary) hypertension: Secondary | ICD-10-CM

## 2020-10-07 DIAGNOSIS — I25119 Atherosclerotic heart disease of native coronary artery with unspecified angina pectoris: Secondary | ICD-10-CM

## 2020-10-07 NOTE — H&P (View-Only) (Signed)
Cardiology Office Note:    Date:  10/07/2020   ID:  Derrick Stokes, DOB 10-Mar-1951, MRN [id number]  PCP:  [name], [name], [place]  Cardiologist:  Sherren Mocha, MD  Advanced Practice Provider:  No care team member to display Electrophysiologist:  None     Referring MD: [name], [name], MD   Chief Complaint  Patient presents with  . Chest Pain    History of Present Illness:    Derrick Stokes is a 70 y.o. male with a hx of:  Coronary artery diseases/pCABG ? [place] 2016: S-OM, S-RCA 100; L-LAD, S-Dx ok; PCI: DES to LCx (leading into OM2); DES to RCA ? Chest pain >>Cath 8/19: 2/4 grafts patent; LCx and RCA stents patent  Hyperlipidemia  Tobacco use  Peripheral neuropathy ? ABIs 07/2018: normal  The patient is here with his wife today. He states that he's been having 'serious chest pains' at times. They usually don't last for more than a few minutes, but he is forced to stop and rest. States that he has to stay still for the pain to go away. He hasn't taken any NTG, but he carries it regularly. He also experiences numbness in his left arm associated with this. The arm numbness sometimes occurs before his chest pain and he doesn't know whether it is heart related, but he did experience this prior to his CABG. He denies dyspnea or other associated symptoms. These symptoms have been present for about 1 month. He has been unable to tolerate isosorbide because of severe headache.   Past Medical History:  Diagnosis Date  . Arthritis   . B12 deficiency    Borderline  . CAD (coronary artery disease)    a. s/p CABG 01/2011;  b. ETT Myoview 3/14:  Low risk;  c. 09/2014 Cath/PCI: LM nl, LAD 100p, LCX 57m/OM2 60-70p (2.75x28 Synergy DES), RCA dom, 70m (atherectomy, 3.5x24 Synergy DES), PDA nl, LIMA->LAD nl, VG->Diag nl, VG->OM 100, VG->RCA 100.  . Cataract    bilateral sx   . COPD (chronic obstructive pulmonary disease) ([place])   . GERD  (gastroesophageal reflux disease)    on meds  . Hiatal hernia   . History of echocardiogram    Echo 10/16: EF 50-55%, no RWMA, Gr 1 DD, mild MR, mild TR  . History of kidney stones    Passed  . HLD (hyperlipidemia)    on meds  . HOH (hard of hearing)   . Hypertension    on meds  . Leg pain    ABIs 4/14:  R 1.2, L 1.2, TBIs normal  . Peripheral neuropathy    From trauma  . Reactive airway disease 03/18/2018  . Tobacco abuse     Past Surgical History:  Procedure Laterality Date  . COLONOSCOPY  03/09/2020   Neg  . COLONOSCOPY WITH PROPOFOL N/A 05/08/2020   Procedure: COLONOSCOPY WITH PROPOFOL;  Surgeon: Rush Landmark Telford Nab., MD;  Location: [place];  Service: Gastroenterology;  Laterality: N/A;  . CORONARY ARTERY BYPASS GRAFT  2013   x5  . ENDOSCOPIC MUCOSAL RESECTION N/A 05/08/2020   Procedure: ENDOSCOPIC MUCOSAL RESECTION;  Surgeon: Rush Landmark Telford Nab., MD;  Location: [place];  Service: Gastroenterology;  Laterality: N/A;  . EPIDURAL STEROIDS    . EYE SURGERY Bilateral    Cataract  . HEMOSTASIS CLIP PLACEMENT  05/08/2020   Procedure: HEMOSTASIS CLIP PLACEMENT;  Surgeon: Irving Copas., MD;  Location: [place];  Service: Gastroenterology;;  .  HEMOSTASIS CONTROL  05/08/2020   Procedure: HEMOSTASIS CONTROL;  Surgeon: Rush Landmark Telford Nab., MD;  Location: [place];  Service: Gastroenterology;;  . Cruzita Lederer PRESSURE WIRE/FFR STUDY N/A 03/17/2018   Procedure: INTRAVASCULAR PRESSURE WIRE/FFR STUDY;  Surgeon: Lorretta Harp, MD;  Location: [place] [facility];  Service: Cardiovascular;  Laterality: N/A;  . LEFT HEART CATH AND CORONARY ANGIOGRAPHY N/A 03/17/2018   Procedure: LEFT HEART CATH AND CORONARY ANGIOGRAPHY;  Surgeon: Lorretta Harp, MD;  Location: [place] [facility];  Service: Cardiovascular;  Laterality: N/A;  . LEFT HEART CATHETERIZATION WITH CORONARY/GRAFT ANGIOGRAM N/A 09/18/2014   Procedure: LEFT HEART CATHETERIZATION WITH  [name];  Surgeon: [name], MD;  Location: [facility] CATH LAB;  Service: Cardiovascular;  Laterality: N/A;  . PERCUTANEOUS CORONARY ROTOBLATOR INTERVENTION (PCI-R) N/A 09/20/2014   Procedure: PERCUTANEOUS CORONARY ROTOBLATOR INTERVENTION (PCI-R);  Surgeon: [name], MD;  Location: [facility] CATH LAB;  Service: Cardiovascular;  Laterality: N/A;  . POLYPECTOMY  02/28/2020   6 polyps/tics/hems  . POLYPECTOMY  05/08/2020   Procedure: POLYPECTOMY;  Surgeon: Mansouraty, Telford Nab., MD;  Location: [place];  Service: Gastroenterology;;  . Clide Deutscher  05/08/2020   Procedure: Clide Deutscher;  Surgeon: Irving Copas., MD;  Location: [place];  Service: Gastroenterology;;  . Maryagnes Amos INJECTION  05/08/2020   Procedure: SUBMUCOSAL LIFTING INJECTION;  Surgeon: Irving Copas., MD;  Location: [place];  Service: Gastroenterology;;    Current Medications: Current Meds  Medication Sig  . aspirin EC 81 MG tablet Take 81 mg by mouth at bedtime.   [name] atorvastatin (LIPITOR) 80 MG tablet TAKE 1 TABLET (80 MG TOTAL) BY MOUTH DAILY AT 6 PM.  . clopidogrel (PLAVIX) 75 MG tablet TAKE 1 TABLET BY MOUTH EVERY DAY WITH BREAKFAST (Patient taking differently: Take 75 mg by mouth daily.)  . metoprolol tartrate (LOPRESSOR) 25 MG tablet Take 1 tablet (25 mg total) by mouth 2 (two) times daily. (Patient taking differently: Take 12.5 mg by mouth 2 (two) times daily.)  . nitroGLYCERIN (NITROSTAT) 0.4 MG SL tablet PLACE 1 TABLET UNDER THE TONGUE EVERY 5 MINUTES X 3 DOSES AS NEEDED FOR CHEST PAIN. (Patient taking differently: Place 0.4 mg under the tongue every 5 (five) minutes as needed for chest pain.)  . Omega-3 Fatty Acids (FISH OIL) 1000 MG CPDR Take 1,000 mg by mouth daily.   . ondansetron (ZOFRAN ODT) 8 MG disintegrating tablet Take 1 tablet (8 mg total) by mouth every 8 (eight) hours as needed for nausea or vomiting.  . pantoprazole (PROTONIX) 40 MG tablet  TAKE 1 TABLET BY MOUTH EVERY DAY (Patient taking differently: Take 40 mg by mouth daily.)  . vitamin B-12 (CYANOCOBALAMIN) 1000 MCG tablet Take 1,000 mcg by mouth daily.      Allergies:   Iodine, Iohexol, Shrimp [shellfish allergy], and Isosorbide dinitrate   Social History   Socioeconomic History  . Marital status: Married    Spouse name: Not on file  . Number of children: Not on file  . Years of education: Not on file  . Highest education level: Not on file  Occupational History  . Occupation: Di[employer]ability  Tobacco Use  . Smoking status: Current Every Day Smoker    Packs/day: 1.00    Years: 51.00    Pack years: 51.00    Types: Cigarettes    Start date: 7  . Smokeless tobacco: Never Used  Vaping Use  . Vaping Use: Never used  Substance and Sexual Activity  . Alcohol use: Yes  Alcohol/week: 3.0 standard drinks    Types: 3 Cans of beer per week  . Drug use: No  . Sexual activity: Yes  Other Topics Concern  . Not on file  Social History Narrative   Married   Gets regular exercise - walks 1/4 mile per day 5 times a week without symptoms   Social Determinants of Radio broadcast assistant Strain: Not on file  Food Insecurity: Not on file  Transportation Needs: Not on file  Physical Activity: Not on file  Stress: Not on file  Social Connections: Not on file     Family History: The patient's family history includes Cancer in his mother; Diabetes in his brother, brother, and father; Heart attack (age of onset: 82) in his father; Heart disease in his father. There is no history of Colon cancer, Esophageal cancer, Stomach cancer, Pancreatic cancer, Inflammatory bowel disease, Liver disease, Rectal cancer, or Colon polyps.  ROS:   Please see the history of present illness.    All other systems reviewed and are negative.  EKGs/Labs/Other Studies Reviewed:    The following studies were reviewed today: Cardiac Cath 03/17/2018:     CARDIAC CATHETERIZATION / FFR  LCX    History obtained from chart review.Myquan Schaumburg Allredis a 70 y.o.malewith a history of coronary artery disease s/p CABGin 2012,s/p PCI with DES to the LCx and OM2 and DES to the RCA in 2016,hyperlipidemia, tobacco abuse who presented to the office for evaluation ofchest pain.  He has been having chest pain for last 2 weeks.  He was admitted from the office with unstable angina and placed on IV heparin and nitroglycerin.  His enzymes were negative.  He presents now for diagnostic coronary angiography.   PROCEDURE DESCRIPTION:   The patient was brought to the second floor [facility] cath lab in the postabsorptive state. He was not premedicated . His right groin was prepped and shaved in usual sterile fashion. Xylocaine 1% was used  for local anesthesia. A 6 French sheath was inserted into the right common femoral artery using standard Seldinger technique.  5 French right and left Judkins diagnostic catheters along with a 5 French pigtail catheter used for selective coronary angiography, selective vein graft and IMA graft angiography and left ventriculography.  Omnipaque dye was used for the entirety of the case.  Retrograde aorta, left ventricular and pullback pressures were recorded.  The patient received a total of 8000 units of heparin with an ACT of greater than 300.  He was already on aspirin and Plavix.  Using an XB 3.5 cm 6 [place] guide catheter along with a 0.14 pro-water guidewire and and ACIST FFR device I performed FFR of the mid AV groove circumflex within the previously stented segment for a moderate area of "in-stent restenosis.  Adenosine was infused and the lowest FFR noted was 0.88 suggesting that the lesion was not physiologically significant.  Guidewire and catheter were removed and the sheath was secured.   IMPRESSION: Mr. Sanor has no culprit lesion.  His vein graft to diagonal branch and LIMA to the LAD were widely patent.  Previously demonstrated  occlusion of the RCA and obtuse marginal branch vein grafts.  The stent to the RCA is widely patent.  The stent to the circumflex is widely patent with a small area of in-stent restenosis of at most 40 to 50%.  FFR of this area was only 0.88 suggesting that it was not physiologically significant.  His LV function was normal.  There  is nothing in his coronary anatomy to suggest that he has coronary insufficiency attributed to his chest pain.  The sheath will be removed once ACT falls below 170 and pressure held.  The patient left the lab in stable condition.  Diagnostic Dominance: Right    EKG: EKG from 09/20/2020 is reviewed from the primary care office.  This EKG demonstrates sinus bradycardia 54 bpm and is otherwise within normal limits.  EKG is performed today and demonstrates sinus bradycardia 58 bpm, within normal limits.  Recent Labs: 02/05/2020: ALT 36; Hemoglobin 16.2; Platelets 245 09/20/2020: BUN 15; Creatinine, Ser 0.79; Potassium 4.5; Sodium 139  Recent Lipid Panel    Component Value Date/Time   CHOL 139 09/20/2020 0858   CHOL 133 03/08/2019 0905   TRIG 104.0 09/20/2020 0858   HDL 43.20 09/20/2020 0858   HDL 36 (L) 03/08/2019 0905   CHOLHDL 3 09/20/2020 0858   VLDL 20.8 09/20/2020 0858   LDLCALC 75 09/20/2020 0858   LDLCALC 101 (H) 02/05/2020 1417   LDLDIRECT 68.0 11/19/2014 0737     Risk Assessment/Calculations:       Physical Exam:    VS:  BP 110/80   Pulse (!) 58   Ht 5\' 8"  (1.727 m)   Wt 178 lb 12.8 oz (81.1 kg)   SpO2 99%   BMI 27.19 kg/m     Wt Readings from Last 3 Encounters:  10/07/20 178 lb 12.8 oz (81.1 kg)  09/20/20 178 lb (80.7 kg)  05/01/20 182 lb (82.6 kg)     GEN:  Well nourished, well developed in no acute distress HEENT: Normal NECK: No JVD; No carotid bruits LYMPHATICS: No lymphadenopathy CARDIAC: RRR, no murmurs, rubs, gallops RESPIRATORY:  Clear to auscultation without rales, wheezing or rhonchi  ABDOMEN: Soft, non-tender,  non-distended MUSCULOSKELETAL:  No edema; No deformity  SKIN: Warm and dry NEUROLOGIC:  Alert and oriented x 3 PSYCHIATRIC:  Normal affect   ASSESSMENT:    1. Essential hypertension   2. Mixed hyperlipidemia   3. Coronary artery disease involving native coronary artery of native heart with angina pectoris ([place])   4. Tobacco abuse    PLAN:    In order of problems listed above:  1. Blood pressure well controlled.  Patient unable to tolerate isosorbide.  Continue metoprolol.  Encouraged him to be more consistent with his nighttime dosing. 2. Treated with a high intensity statin drug.  Recent labs with a cholesterol 139, HDL 43, LDL 75. 3. Patient reports progressive, now CCS class III angina.  Reviewed options of medical therapy, noninvasive testing, or definitive evaluation with cardiac catheterization.  Considering the recent onset of symptoms and his description of severe discomfort, favor invasive evaluation with cardiac catheterization and possible PCI.  Current medicines will be continued.  He is treated with dual antiplatelet therapy using aspirin and clopidogrel. I have reviewed the risks, indications, and alternatives to cardiac catheterization, possible angioplasty, and stenting with the patient. Risks include but are not limited to bleeding, infection, vascular injury, stroke, myocardial infection, arrhythmia, kidney injury, radiation-related injury in the case of prolonged fluoroscopy use, emergency cardiac surgery, and death. The patient understands the risks of serious complication is 1-2 in 2440 with diagnostic cardiac cath and 1-2% or less with angioplasty/stenting.  I reviewed previous cardiac catheterization studies today as part of his evaluation.  I reviewed the films from his most recent procedure in 2018. 4. Cessation counseling done.  Patient motivated to quit.  He quit smoking for 6 months after bypass  surgery and again for 6 months after undergoing stenting in 2016.  He  continues to smoke and will do his best to quit again.  Quitting cold [place] has worked for him in the past.  Medication Adjustments/Labs and Tests Ordered: Current medicines are reviewed at length with the patient today.  Concerns regarding medicines are outlined above.  Orders Placed This Encounter  Procedures  . Basic metabolic panel  . CBC with Differential/Platelet  . EKG 12-Lead   No orders of the defined types were placed in this encounter.   Patient Instructions  COVID SCREENING INFORMATION (3/28): You are scheduled for your drive-thru COVID screening on 3/28 between noon and 1PM. Pre-Procedural COVID-19 Testing Site [address] [place], [place] [address] You will need to go home after your screening and quarantine until your procedure.    CATHETERIZATION INSTRUCTIONS (3/30): You are scheduled for a Cardiac Catheterization on Wednesday, March 30 with Dr. Sherren Mocha.  1. Please arrive at the [facility] (Main Entrance A) at [facility]: [address] [place], [place] [address] at 8:30 AM (This time is two hours before your procedure to ensure your preparation). Free valet parking service is available. You are allowed ONE visitor in the waiting room during your procedure. Both you and your guest must wear masks. Special note: Every effort is made to have your procedure done on time. Please understand that emergencies sometimes delay scheduled procedures.  2. Diet: Do not eat solid foods after midnight.  You may have clear liquids until 5am upon the day of the procedure.  3. Labs: TODAY! BMET, CBC  4. Medication instructions in preparation for your procedure:  1) MAKE SURE TO TAKE ASPIRIN AND PLAVIX the morning of your cath  2) You may take your other medications as directed with sips of water   5. Plan for one night stay--bring personal belongings. 6. Bring a current list of your medications and current insurance cards. 7. You MUST have a responsible person  to drive you home. 8. Someone MUST be with you the first 24 hours after you arrive home or your discharge will be delayed. 9. Please wear clothes that are easy to get on and off and wear slip-on shoes.   FOLLOW-UP APPOINTMENT: You are scheduled for a follow-up appointment on October 23, 2020 at 2:15PM with Richardson Dopp, PA.    Signed, Sherren Mocha, MD  10/07/2020 1:27 PM    [facility]

## 2020-10-07 NOTE — Patient Instructions (Signed)
COVID SCREENING INFORMATION (3/28): You are scheduled for your drive-thru COVID screening on 3/28 between noon and 1PM. Pre-Procedural COVID-19 Testing Site [address] [place], [place] [address] You will need to go home after your screening and quarantine until your procedure.    CATHETERIZATION INSTRUCTIONS (3/30): You are scheduled for a Cardiac Catheterization on Wednesday, March 30 with Dr. Sherren Mocha.  1. Please arrive at the [facility] (Main Entrance A) at [facility]: [address] [place], [place] [address] at 8:30 AM (This time is two hours before your procedure to ensure your preparation). Free valet parking service is available. You are allowed ONE visitor in the waiting room during your procedure. Both you and your guest must wear masks. Special note: Every effort is made to have your procedure done on time. Please understand that emergencies sometimes delay scheduled procedures.  2. Diet: Do not eat solid foods after midnight.  You may have clear liquids until 5am upon the day of the procedure.  3. Labs: TODAY! BMET, CBC  4. Medication instructions in preparation for your procedure:  1) MAKE SURE TO TAKE ASPIRIN AND PLAVIX the morning of your cath  2) You may take your other medications as directed with sips of water   5. Plan for one night stay--bring personal belongings. 6. Bring a current list of your medications and current insurance cards. 7. You MUST have a responsible person to drive you home. 8. Someone MUST be with you the first 24 hours after you arrive home or your discharge will be delayed. 9. Please wear clothes that are easy to get on and off and wear slip-on shoes.   FOLLOW-UP APPOINTMENT: You are scheduled for a follow-up appointment on October 23, 2020 at 2:15PM with Richardson Dopp, PA.

## 2020-10-07 NOTE — Progress Notes (Signed)
Cardiology Office Note:    Date:  10/07/2020   ID:  KANIN LIA, DOB Mar 10, 1951, MRN [id number]  PCP:  [name], [name], [place]  Cardiologist:  Sherren Mocha, MD  Advanced Practice Provider:  No care team member to display Electrophysiologist:  None     Referring MD: [name], [name], MD   Chief Complaint  Patient presents with  . Chest Pain    History of Present Illness:    Derrick Stokes is a 70 y.o. male with a hx of:  Coronary artery diseases/pCABG ? [place] 2016: S-OM, S-RCA 100; L-LAD, S-Dx ok; PCI: DES to LCx (leading into OM2); DES to RCA ? Chest pain >>Cath 8/19: 2/4 grafts patent; LCx and RCA stents patent  Hyperlipidemia  Tobacco use  Peripheral neuropathy ? ABIs 07/2018: normal  The patient is here with his wife today. He states that he's been having 'serious chest pains' at times. They usually don't last for more than a few minutes, but he is forced to stop and rest. States that he has to stay still for the pain to go away. He hasn't taken any NTG, but he carries it regularly. He also experiences numbness in his left arm associated with this. The arm numbness sometimes occurs before his chest pain and he doesn't know whether it is heart related, but he did experience this prior to his CABG. He denies dyspnea or other associated symptoms. These symptoms have been present for about 1 month. He has been unable to tolerate isosorbide because of severe headache.   Past Medical History:  Diagnosis Date  . Arthritis   . B12 deficiency    Borderline  . CAD (coronary artery disease)    a. s/p CABG 01/2011;  b. ETT Myoview 3/14:  Low risk;  c. 09/2014 Cath/PCI: LM nl, LAD 100p, LCX 56m/OM2 60-70p (2.75x28 Synergy DES), RCA dom, 61m (atherectomy, 3.5x24 Synergy DES), PDA nl, LIMA->LAD nl, VG->Diag nl, VG->OM 100, VG->RCA 100.  . Cataract    bilateral sx   . COPD (chronic obstructive pulmonary disease) ([place])   . GERD  (gastroesophageal reflux disease)    on meds  . Hiatal hernia   . History of echocardiogram    Echo 10/16: EF 50-55%, no RWMA, Gr 1 DD, mild MR, mild TR  . History of kidney stones    Passed  . HLD (hyperlipidemia)    on meds  . HOH (hard of hearing)   . Hypertension    on meds  . Leg pain    ABIs 4/14:  R 1.2, L 1.2, TBIs normal  . Peripheral neuropathy    From trauma  . Reactive airway disease 03/18/2018  . Tobacco abuse     Past Surgical History:  Procedure Laterality Date  . COLONOSCOPY  03/09/2020   Neg  . COLONOSCOPY WITH PROPOFOL N/A 05/08/2020   Procedure: COLONOSCOPY WITH PROPOFOL;  Surgeon: Rush Landmark Telford Nab., MD;  Location: [place];  Service: Gastroenterology;  Laterality: N/A;  . CORONARY ARTERY BYPASS GRAFT  2013   x5  . ENDOSCOPIC MUCOSAL RESECTION N/A 05/08/2020   Procedure: ENDOSCOPIC MUCOSAL RESECTION;  Surgeon: Rush Landmark Telford Nab., MD;  Location: [place];  Service: Gastroenterology;  Laterality: N/A;  . EPIDURAL STEROIDS    . EYE SURGERY Bilateral    Cataract  . HEMOSTASIS CLIP PLACEMENT  05/08/2020   Procedure: HEMOSTASIS CLIP PLACEMENT;  Surgeon: Irving Copas., MD;  Location: [place];  Service: Gastroenterology;;  .  HEMOSTASIS CONTROL  05/08/2020   Procedure: HEMOSTASIS CONTROL;  Surgeon: Rush Landmark Telford Nab., MD;  Location: [place];  Service: Gastroenterology;;  . Cruzita Lederer PRESSURE WIRE/FFR STUDY N/A 03/17/2018   Procedure: INTRAVASCULAR PRESSURE WIRE/FFR STUDY;  Surgeon: Lorretta Harp, MD;  Location: [place] [facility];  Service: Cardiovascular;  Laterality: N/A;  . LEFT HEART CATH AND CORONARY ANGIOGRAPHY N/A 03/17/2018   Procedure: LEFT HEART CATH AND CORONARY ANGIOGRAPHY;  Surgeon: Lorretta Harp, MD;  Location: [place] [facility];  Service: Cardiovascular;  Laterality: N/A;  . LEFT HEART CATHETERIZATION WITH CORONARY/GRAFT ANGIOGRAM N/A 09/18/2014   Procedure: LEFT HEART CATHETERIZATION WITH  [name];  Surgeon: [name], MD;  Location: [facility] CATH LAB;  Service: Cardiovascular;  Laterality: N/A;  . PERCUTANEOUS CORONARY ROTOBLATOR INTERVENTION (PCI-R) N/A 09/20/2014   Procedure: PERCUTANEOUS CORONARY ROTOBLATOR INTERVENTION (PCI-R);  Surgeon: [name], MD;  Location: [facility] C[facility];  Service: Cardiovascular;  Laterality: N/A;  . POLYPECTOMY  02/28/2020   6 polyps/tics/hems  . POLYPECTOMY  05/08/2020   Procedure: POLYPECTOMY;  Surgeon: Mansouraty, Telford Nab., MD;  Location: [place];  Service: Gastroenterology;;  . Clide Deutscher  05/08/2020   Procedure: Clide Deutscher;  Surgeon: Irving Copas., MD;  Location: [place];  Service: Gastroenterology;;  . Maryagnes Amos INJECTION  05/08/2020   Procedure: SUBMUCOSAL LIFTING INJECTION;  Surgeon: Irving Copas., MD;  Location: [place];  Service: Gastroenterology;;    Current Medications: Current Meds  Medication Sig  . aspirin EC 81 MG tablet Take 81 mg by mouth at bedtime.   [name] atorvastatin (LIPITOR) 80 MG tablet TAKE 1 TABLET (80 MG TOTAL) BY MOUTH DAILY AT 6 PM.  . clopidogrel (PLAVIX) 75 MG tablet TAKE 1 TABLET BY MOUTH EVERY DAY WITH BREAKFAST (Patient taking differently: Take 75 mg by mouth daily.)  . metoprolol tartrate (LOPRESSOR) 25 MG tablet Take 1 tablet (25 mg total) by mouth 2 (two) times daily. (Patient taking differently: Take 12.5 mg by mouth 2 (two) times daily.)  . nitroGLYCERIN (NITROSTAT) 0.4 MG SL tablet PLACE 1 TABLET UNDER THE TONGUE EVERY 5 MINUTES X 3 DOSES AS NEEDED FOR CHEST PAIN. (Patient taking differently: Place 0.4 mg under the tongue every 5 (five) minutes as needed for chest pain.)  . Omega-3 Fatty Acids (FISH OIL) 1000 MG CPDR Take 1,000 mg by mouth daily.   . ondansetron (ZOFRAN ODT) 8 MG disintegrating tablet Take 1 tablet (8 mg total) by mouth every 8 (eight) hours as needed for nausea or vomiting.  . pantoprazole (PROTONIX) 40 MG tablet  TAKE 1 TABLET BY MOUTH EVERY DAY (Patient taking differently: Take 40 mg by mouth daily.)  . vitamin B-12 (CYANOCOBALAMIN) 1000 MCG tablet Take 1,000 mcg by mouth daily.      Allergies:   Iodine, Iohexol, Shrimp [shellfish allergy], and Isosorbide dinitrate   Social History   Socioeconomic History  . Marital status: Married    Spouse name: Not on file  . Number of children: Not on file  . Years of education: Not on file  . Highest education level: Not on file  Occupational History  . Occupation: Di[employer]ability  Tobacco Use  . Smoking status: Current Every Day Smoker    Packs/day: 1.00    Years: 51.00    Pack years: 51.00    Types: Cigarettes    Start date: 50  . Smokeless tobacco: Never Used  Vaping Use  . Vaping Use: Never used  Substance and Sexual Activity  . Alcohol use: Yes  Alcohol/week: 3.0 standard drinks    Types: 3 Cans of beer per week  . Drug use: No  . Sexual activity: Yes  Other Topics Concern  . Not on file  Social History Narrative   Married   Gets regular exercise - walks 1/4 mile per day 5 times a week without symptoms   Social Determinants of Radio broadcast assistant Strain: Not on file  Food Insecurity: Not on file  Transportation Needs: Not on file  Physical Activity: Not on file  Stress: Not on file  Social Connections: Not on file     Family History: The patient's family history includes Cancer in his mother; Diabetes in his brother, brother, and father; Heart attack (age of onset: 10) in his father; Heart disease in his father. There is no history of Colon cancer, Esophageal cancer, Stomach cancer, Pancreatic cancer, Inflammatory bowel disease, Liver disease, Rectal cancer, or Colon polyps.  ROS:   Please see the history of present illness.    All other systems reviewed and are negative.  EKGs/Labs/Other Studies Reviewed:    The following studies were reviewed today: Cardiac Cath 03/17/2018:     CARDIAC CATHETERIZATION / FFR  LCX    History obtained from chart review.Toan Mort Allredis a 70 y.o.malewith a history of coronary artery disease s/p CABGin 2012,s/p PCI with DES to the LCx and OM2 and DES to the RCA in 2016,hyperlipidemia, tobacco abuse who presented to the office for evaluation ofchest pain.  He has been having chest pain for last 2 weeks.  He was admitted from the office with unstable angina and placed on IV heparin and nitroglycerin.  His enzymes were negative.  He presents now for diagnostic coronary angiography.   PROCEDURE DESCRIPTION:   The patient was brought to the second floor [facility]iac cath lab in the postabsorptive state. He was not premedicated . His right groin was prepped and shaved in usual sterile fashion. Xylocaine 1% was used  for local anesthesia. A 6 French sheath was inserted into the right common femoral artery using standard Seldinger technique.  5 French right and left Judkins diagnostic catheters along with a 5 French pigtail catheter used for selective coronary angiography, selective vein graft and IMA graft angiography and left ventriculography.  Omnipaque dye was used for the entirety of the case.  Retrograde aorta, left ventricular and pullback pressures were recorded.  The patient received a total of 8000 units of heparin with an ACT of greater than 300.  He was already on aspirin and Plavix.  Using an XB 3.5 cm 6 [place] guide catheter along with a 0.14 pro-water guidewire and and ACIST FFR device I performed FFR of the mid AV groove circumflex within the previously stented segment for a moderate area of "in-stent restenosis.  Adenosine was infused and the lowest FFR noted was 0.88 suggesting that the lesion was not physiologically significant.  Guidewire and catheter were removed and the sheath was secured.   IMPRESSION: Mr. Bribiesca has no culprit lesion.  His vein graft to diagonal branch and LIMA to the LAD were widely patent.  Previously demonstrated  occlusion of the RCA and obtuse marginal branch vein grafts.  The stent to the RCA is widely patent.  The stent to the circumflex is widely patent with a small area of in-stent restenosis of at most 40 to 50%.  FFR of this area was only 0.88 suggesting that it was not physiologically significant.  His LV function was normal.  There  is nothing in his coronary anatomy to suggest that he has coronary insufficiency attributed to his chest pain.  The sheath will be removed once ACT falls below 170 and pressure held.  The patient left the lab in stable condition.  Diagnostic Dominance: Right    EKG: EKG from 09/20/2020 is reviewed from the primary care office.  This EKG demonstrates sinus bradycardia 54 bpm and is otherwise within normal limits.  EKG is performed today and demonstrates sinus bradycardia 58 bpm, within normal limits.  Recent Labs: 02/05/2020: ALT 36; Hemoglobin 16.2; Platelets 245 09/20/2020: BUN 15; Creatinine, Ser 0.79; Potassium 4.5; Sodium 139  Recent Lipid Panel    Component Value Date/Time   CHOL 139 09/20/2020 0858   CHOL 133 03/08/2019 0905   TRIG 104.0 09/20/2020 0858   HDL 43.20 09/20/2020 0858   HDL 36 (L) 03/08/2019 0905   CHOLHDL 3 09/20/2020 0858   VLDL 20.8 09/20/2020 0858   LDLCALC 75 09/20/2020 0858   LDLCALC 101 (H) 02/05/2020 1417   LDLDIRECT 68.0 11/19/2014 0737     Risk Assessment/Calculations:       Physical Exam:    VS:  BP 110/80   Pulse (!) 58   Ht 5\' 8"  (1.727 m)   Wt 178 lb 12.8 oz (81.1 kg)   SpO2 99%   BMI 27.19 kg/m     Wt Readings from Last 3 Encounters:  10/07/20 178 lb 12.8 oz (81.1 kg)  09/20/20 178 lb (80.7 kg)  05/01/20 182 lb (82.6 kg)     GEN:  Well nourished, well developed in no acute distress HEENT: Normal NECK: No JVD; No carotid bruits LYMPHATICS: No lymphadenopathy CARDIAC: RRR, no murmurs, rubs, gallops RESPIRATORY:  Clear to auscultation without rales, wheezing or rhonchi  ABDOMEN: Soft, non-tender,  non-distended MUSCULOSKELETAL:  No edema; No deformity  SKIN: Warm and dry NEUROLOGIC:  Alert and oriented x 3 PSYCHIATRIC:  Normal affect   ASSESSMENT:    1. Essential hypertension   2. Mixed hyperlipidemia   3. Coronary artery disease involving native coronary artery of native heart with angina pectoris ([place])   4. Tobacco abuse    PLAN:    In order of problems listed above:  1. Blood pressure well controlled.  Patient unable to tolerate isosorbide.  Continue metoprolol.  Encouraged him to be more consistent with his nighttime dosing. 2. Treated with a high intensity statin drug.  Recent labs with a cholesterol 139, HDL 43, LDL 75. 3. Patient reports progressive, now CCS class III angina.  Reviewed options of medical therapy, noninvasive testing, or definitive evaluation with cardiac catheterization.  Considering the recent onset of symptoms and his description of severe discomfort, favor invasive evaluation with cardiac catheterization and possible PCI.  Current medicines will be continued.  He is treated with dual antiplatelet therapy using aspirin and clopidogrel. I have reviewed the risks, indications, and alternatives to cardiac catheterization, possible angioplasty, and stenting with the patient. Risks include but are not limited to bleeding, infection, vascular injury, stroke, myocardial infection, arrhythmia, kidney injury, radiation-related injury in the case of prolonged fluoroscopy use, emergency cardiac surgery, and death. The patient understands the risks of serious complication is 1-2 in [id number] with diagnostic cardiac cath and 1-2% or less with angioplasty/stenting.  I reviewed previous cardiac catheterization studies today as part of his evaluation.  I reviewed the films from his most recent procedure in 2018. 4. Cessation counseling done.  Patient motivated to quit.  He quit smoking for 6 months after bypass  surgery and again for 6 months after undergoing stenting in 2016.  He  continues to smoke and will do his best to quit again.  Quitting cold [place] has worked for him in the past.  Medication Adjustments/Labs and Tests Ordered: Current medicines are reviewed at length with the patient today.  Concerns regarding medicines are outlined above.  Orders Placed This Encounter  Procedures  . Basic metabolic panel  . CBC with Differential/Platelet  . EKG 12-Lead   No orders of the defined types were placed in this encounter.   Patient Instructions  COVID SCREENING INFORMATION (3/28): You are scheduled for your drive-thru COVID screening on 3/28 between noon and 1PM. Pre-Procedural COVID-19 Testing Site [address] [place], [place] [address] You will need to go home after your screening and quarantine until your procedure.    CATHETERIZATION INSTRUCTIONS (3/30): You are scheduled for a Cardiac Catheterization on Wednesday, March 30 with Dr. Sherren Mocha.  1. Please arrive at the [facility] (Main Entrance A) at [facility] [address] [place], [place] [address] at 8:30 AM (This time is two hours before your procedure to ensure your preparation). Free valet parking service is available. You are allowed ONE visitor in the waiting room during your procedure. Both you and your guest must wear masks. Special note: Every effort is made to have your procedure done on time. Please understand that emergencies sometimes delay scheduled procedures.  2. Diet: Do not eat solid foods after midnight.  You may have clear liquids until 5am upon the day of the procedure.  3. Labs: TODAY! BMET, CBC  4. Medication instructions in preparation for your procedure:  1) MAKE SURE TO TAKE ASPIRIN AND PLAVIX the morning of your cath  2) You may take your other medications as directed with sips of water   5. Plan for one night stay--bring personal belongings. 6. Bring a current list of your medications and current insurance cards. 7. You MUST have a responsible person  to drive you home. 8. Someone MUST be with you the first 24 hours after you arrive home or your discharge will be delayed. 9. Please wear clothes that are easy to get on and off and wear slip-on shoes.   FOLLOW-UP APPOINTMENT: You are scheduled for a follow-up appointment on October 23, 2020 at 2:15PM with Richardson Dopp, PA.    Signed, Sherren Mocha, MD  10/07/2020 1:27 PM    [facility]

## 2020-10-08 LAB — CBC WITH DIFFERENTIAL/PLATELET
Basophils Absolute: 0.1 10*3/uL (ref 0.0–0.2)
Basos: 1 %
EOS (ABSOLUTE): 0.1 10*3/uL (ref 0.0–0.4)
Eos: 2 %
Hematocrit: 47.8 % (ref 37.5–51.0)
Hemoglobin: 16.5 g/dL (ref 13.0–17.7)
Immature Grans (Abs): 0 10*3/uL (ref 0.0–0.1)
Immature Granulocytes: 0 %
Lymphocytes Absolute: 2 10*3/uL (ref 0.7–3.1)
Lymphs: 29 %
MCH: 31.3 pg (ref 26.6–33.0)
MCHC: 34.5 g/dL (ref 31.5–35.7)
MCV: 91 fL (ref 79–97)
Monocytes Absolute: 0.6 10*3/uL (ref 0.1–0.9)
Monocytes: 8 %
Neutrophils Absolute: 4.2 10*3/uL (ref 1.4–7.0)
Neutrophils: 60 %
Platelets: 261 10*3/uL (ref 150–450)
RBC: 5.27 x10E6/uL (ref 4.14–5.80)
RDW: 12.1 % (ref 11.6–15.4)
WBC: 7 10*3/uL (ref 3.4–10.8)

## 2020-10-08 LAB — BASIC METABOLIC PANEL
BUN/Creatinine Ratio: 15 (ref 10–24)
BUN: 12 mg/dL (ref 8–27)
CO2: 21 mmol/L (ref 20–29)
Calcium: 9.6 mg/dL (ref 8.6–10.2)
Chloride: 98 mmol/L (ref 96–106)
Creatinine, Ser: 0.81 mg/dL (ref 0.76–1.27)
Glucose: 105 mg/dL — ABNORMAL HIGH (ref 65–99)
Potassium: 4.9 mmol/L (ref 3.5–5.2)
Sodium: 136 mmol/L (ref 134–144)
eGFR: 96 mL/min/{1.73_m2} (ref >59)

## 2020-10-11 ENCOUNTER — Ambulatory Visit: Payer: Medicare HMO | Admitting: Adult Health

## 2020-10-14 ENCOUNTER — Other Ambulatory Visit (HOSPITAL_COMMUNITY)
Admission: RE | Admit: 2020-10-14 | Discharge: 2020-10-14 | Disposition: A | Discharge status: Home / Self Care | Payer: Medicare HMO | Source: Ambulatory Visit | Attending: Cardiovascular Disease | Admitting: Cardiovascular Disease

## 2020-10-14 ENCOUNTER — Telehealth: Payer: Self-pay | Admitting: Cardiovascular Disease

## 2020-10-14 DIAGNOSIS — Z20822 Contact with and (suspected) exposure to covid-19: Secondary | ICD-10-CM | POA: Diagnosis not present

## 2020-10-14 DIAGNOSIS — Z01812 Encounter for preprocedural laboratory examination: Secondary | ICD-10-CM | POA: Diagnosis not present

## 2020-10-14 NOTE — Telephone Encounter (Signed)
Wife of the patient called. She and the patient missed a call from the office but no one left a message. She wanted to know if it was regarding some pre-procedure instructions for his heart cath scheduled for Wednesday. Please assist

## 2020-10-14 NOTE — Telephone Encounter (Signed)
Pt contacted pre-catheterization scheduled at [facility] for: Wednesday October 16, 2020 10:30 AM Verified arrival time and place: [facility]) at: 8;30 AM   No solid food after midnight prior to cath, clear liquids until 5 AM day of procedure.  CONTRAST ALLERGY: no Pt reports nausea, getting sweaty and turning white in the face when he eats shrimp/ foods with iodine.  AM meds can be  taken pre-cath with sips of water including: ASA 81 mg Plavix 75 mg  Confirmed patient has responsible adult to drive home post procedure and be with patient first 24 hours after arriving home: yes  You are allowed ONE visitor in the waiting room during the time you are at the hospital for your procedure. Both you and your visitor must wear a mask once you enter the hospital.   Reviewed procedure/mask/visitor instructions with patient.

## 2020-10-15 LAB — SARS CORONAVIRUS 2 (TAT 6-24 HRS): SARS Coronavirus 2: NEGATIVE

## 2020-10-16 ENCOUNTER — Encounter (HOSPITAL_COMMUNITY)
Admission: RE | Disposition: A | Discharge status: Home / Self Care | Payer: Self-pay | Source: Ambulatory Visit | Attending: Cardiovascular Disease

## 2020-10-16 ENCOUNTER — Ambulatory Visit (HOSPITAL_COMMUNITY)
Admission: RE | Admit: 2020-10-16 | Discharge: 2020-10-16 | Disposition: A | Discharge status: Home / Self Care | Payer: Medicare HMO | Source: Ambulatory Visit | Attending: Cardiovascular Disease | Admitting: Cardiovascular Disease

## 2020-10-16 ENCOUNTER — Encounter (HOSPITAL_COMMUNITY): Payer: Self-pay | Admitting: Cardiovascular Disease

## 2020-10-16 ENCOUNTER — Other Ambulatory Visit: Payer: Self-pay

## 2020-10-16 DIAGNOSIS — F17210 Nicotine dependence, cigarettes, uncomplicated: Secondary | ICD-10-CM | POA: Insufficient documentation

## 2020-10-16 DIAGNOSIS — I25719 Atherosclerosis of autologous vein coronary artery bypass graft(s) with unspecified angina pectoris: Secondary | ICD-10-CM

## 2020-10-16 DIAGNOSIS — Z7902 Long term (current) use of antithrombotics/antiplatelets: Secondary | ICD-10-CM | POA: Diagnosis not present

## 2020-10-16 DIAGNOSIS — E782 Mixed hyperlipidemia: Secondary | ICD-10-CM | POA: Diagnosis not present

## 2020-10-16 DIAGNOSIS — Z91041 Radiographic dye allergy status: Secondary | ICD-10-CM | POA: Insufficient documentation

## 2020-10-16 DIAGNOSIS — Z7982 Long term (current) use of aspirin: Secondary | ICD-10-CM | POA: Diagnosis not present

## 2020-10-16 DIAGNOSIS — Z951 Presence of aortocoronary bypass graft: Secondary | ICD-10-CM | POA: Diagnosis not present

## 2020-10-16 DIAGNOSIS — I10 Essential (primary) hypertension: Secondary | ICD-10-CM | POA: Diagnosis not present

## 2020-10-16 DIAGNOSIS — I2582 Chronic total occlusion of coronary artery: Secondary | ICD-10-CM | POA: Diagnosis not present

## 2020-10-16 DIAGNOSIS — R69 Illness, unspecified: Secondary | ICD-10-CM | POA: Diagnosis not present

## 2020-10-16 DIAGNOSIS — Z955 Presence of coronary angioplasty implant and graft: Secondary | ICD-10-CM | POA: Insufficient documentation

## 2020-10-16 DIAGNOSIS — Z888 Allergy status to other drugs, medicaments and biological substances status: Secondary | ICD-10-CM | POA: Diagnosis not present

## 2020-10-16 DIAGNOSIS — Z79899 Other long term (current) drug therapy: Secondary | ICD-10-CM | POA: Diagnosis not present

## 2020-10-16 DIAGNOSIS — Z91013 Allergy to seafood: Secondary | ICD-10-CM | POA: Diagnosis not present

## 2020-10-16 DIAGNOSIS — I25119 Atherosclerotic heart disease of native coronary artery with unspecified angina pectoris: Secondary | ICD-10-CM | POA: Diagnosis not present

## 2020-10-16 HISTORY — PX: LEFT HEART CATH AND CORS/GRAFTS ANGIOGRAPHY: CATH118250

## 2020-10-16 SURGERY — LEFT HEART CATH AND CORS/GRAFTS ANGIOGRAPHY
Anesthesia: LOCAL

## 2020-10-16 MED ORDER — MIDAZOLAM HCL 2 MG/2ML IJ SOLN
INTRAMUSCULAR | Status: AC
  Filled 2020-10-16: qty 2

## 2020-10-16 MED ORDER — VERAPAMIL HCL 2.5 MG/ML IV SOLN
INTRAVENOUS | Status: DC | PRN
  Administered 2020-10-16: 10 mL via INTRA_ARTERIAL

## 2020-10-16 MED ORDER — SODIUM CHLORIDE 0.9 % WEIGHT BASED INFUSION: 1.0000 mL/kg/h | INTRAVENOUS | Status: DC

## 2020-10-16 MED ORDER — SODIUM CHLORIDE 0.9 % IV SOLN: 250.0000 mL | INTRAVENOUS | Status: DC | PRN

## 2020-10-16 MED ORDER — ACETAMINOPHEN 325 MG PO TABS: 650.0000 mg | ORAL_TABLET | ORAL | Status: DC | PRN

## 2020-10-16 MED ORDER — ASPIRIN 81 MG PO CHEW: 81.0000 mg | CHEWABLE_TABLET | ORAL | Status: DC

## 2020-10-16 MED ORDER — HEPARIN SODIUM (PORCINE) 1000 UNIT/ML IJ SOLN
INTRAMUSCULAR | Status: DC | PRN
  Administered 2020-10-16: 5000 [IU] via INTRAVENOUS

## 2020-10-16 MED ORDER — MIDAZOLAM HCL 2 MG/2ML IJ SOLN
INTRAMUSCULAR | Status: DC | PRN
  Administered 2020-10-16: 2 mg via INTRAVENOUS

## 2020-10-16 MED ORDER — IOHEXOL 350 MG/ML SOLN
INTRAVENOUS | Status: DC | PRN
  Administered 2020-10-16: 40 mL

## 2020-10-16 MED ORDER — LIDOCAINE HCL (PF) 1 % IJ SOLN
INTRAMUSCULAR | Status: AC
  Filled 2020-10-16: qty 30

## 2020-10-16 MED ORDER — SODIUM CHLORIDE 0.9% FLUSH: 3.0000 mL | Freq: Two times a day (BID) | INTRAVENOUS | Status: DC

## 2020-10-16 MED ORDER — METHYLPREDNISOLONE SODIUM SUCC 125 MG IJ SOLR
INTRAMUSCULAR | Status: AC
  Filled 2020-10-16: qty 2

## 2020-10-16 MED ORDER — ONDANSETRON HCL 4 MG/2ML IJ SOLN: 4.0000 mg | Freq: Four times a day (QID) | INTRAMUSCULAR | Status: DC | PRN

## 2020-10-16 MED ORDER — CLOPIDOGREL BISULFATE 75 MG PO TABS: 75.0000 mg | ORAL_TABLET | ORAL | Status: DC

## 2020-10-16 MED ORDER — SODIUM CHLORIDE 0.9 % WEIGHT BASED INFUSION
3.0000 mL/kg/h | INTRAVENOUS | Status: AC
  Administered 2020-10-16: 3 mL/kg/h via INTRAVENOUS

## 2020-10-16 MED ORDER — HYDRALAZINE HCL 20 MG/ML IJ SOLN: 10.0000 mg | INTRAMUSCULAR | Status: DC | PRN

## 2020-10-16 MED ORDER — HEPARIN SODIUM (PORCINE) 1000 UNIT/ML IJ SOLN
INTRAMUSCULAR | Status: AC
  Filled 2020-10-16: qty 1

## 2020-10-16 MED ORDER — SODIUM CHLORIDE 0.9% FLUSH: 3.0000 mL | INTRAVENOUS | Status: DC | PRN

## 2020-10-16 MED ORDER — METHYLPREDNISOLONE SODIUM SUCC 125 MG IJ SOLR
125.0000 mg | Freq: Once | INTRAMUSCULAR | Status: AC
  Administered 2020-10-16: 125 mg via INTRAVENOUS

## 2020-10-16 MED ORDER — LABETALOL HCL 5 MG/ML IV SOLN: 10.0000 mg | INTRAVENOUS | Status: DC | PRN

## 2020-10-16 MED ORDER — FENTANYL CITRATE (PF) 100 MCG/2ML IJ SOLN
INTRAMUSCULAR | Status: DC | PRN
  Administered 2020-10-16: 25 ug via INTRAVENOUS

## 2020-10-16 MED ORDER — DIPHENHYDRAMINE HCL 50 MG/ML IJ SOLN
25.0000 mg | Freq: Once | INTRAMUSCULAR | Status: AC
  Administered 2020-10-16: 25 mg via INTRAVENOUS

## 2020-10-16 MED ORDER — HEPARIN (PORCINE) IN NACL 1000-0.9 UT/500ML-% IV SOLN
INTRAVENOUS | Status: AC
  Filled 2020-10-16: qty 1000

## 2020-10-16 MED ORDER — DIPHENHYDRAMINE HCL 50 MG/ML IJ SOLN
INTRAMUSCULAR | Status: AC
  Filled 2020-10-16: qty 1

## 2020-10-16 MED ORDER — HEPARIN (PORCINE) IN NACL 1000-0.9 UT/500ML-% IV SOLN
INTRAVENOUS | Status: DC | PRN
  Administered 2020-10-16 (×2): 500 mL

## 2020-10-16 MED ORDER — SODIUM CHLORIDE 0.9 % IV SOLN
250.0000 mL | INTRAVENOUS | Status: DC | PRN
Start: 2020-10-16 — End: 2020-10-16

## 2020-10-16 MED ORDER — LIDOCAINE HCL (PF) 1 % IJ SOLN
INTRAMUSCULAR | Status: DC | PRN
  Administered 2020-10-16: 2 mL

## 2020-10-16 MED ORDER — FENTANYL CITRATE (PF) 100 MCG/2ML IJ SOLN
INTRAMUSCULAR | Status: AC
  Filled 2020-10-16: qty 2

## 2020-10-16 SURGICAL SUPPLY — 9 items
CATH INFINITI 5FR MULTPACK ANG (CATHETERS) ×1 IMPLANT
DEVICE RAD COMP TR BAND LRG (VASCULAR PRODUCTS) ×1 IMPLANT
GLIDESHEATH SLEND SS 6F .021 (SHEATH) ×1 IMPLANT
GUIDEWIRE INQWIRE 1.5J.035X260 (WIRE) IMPLANT
INQWIRE 1.5J .035X260CM (WIRE) ×2
KIT HEART LEFT (KITS) ×2 IMPLANT
PACK CARDIAC CATHETERIZATION (CUSTOM PROCEDURE TRAY) ×2 IMPLANT
TRANSDUCER W/STOPCOCK (MISCELLANEOUS) ×2 IMPLANT
TUBING CIL FLEX 10 FLL-RA (TUBING) ×2 IMPLANT

## 2020-10-16 NOTE — Progress Notes (Signed)
Pt ambulated without difficulty or bleeding.  Armboard placed on pt. Instructed to remove tomorrow.  Discharged home with wife who will drive and stay with pt x 24 hrs

## 2020-10-16 NOTE — Interval H&P Note (Signed)
History and Physical Interval Note:  10/16/2020 10:08 AM  Derrick Stokes  has presented today for surgery, with the diagnosis of CAD w/ angina.  The various methods of treatment have been discussed with the patient and family. After consideration of risks, benefits and other options for treatment, the patient has consented to  Procedure(s): LEFT HEART CATH AND CORS/GRAFTS ANGIOGRAPHY (N/A) as a surgical intervention.  The patient's history has been reviewed, patient examined, no change in status, stable for surgery.  I have reviewed the patient's chart and labs.  Questions were answered to the patient's satisfaction.     Sherren Mocha

## 2020-10-16 NOTE — Discharge Instructions (Signed)
Radial Site Care  This sheet gives you information about how to care for yourself after your procedure. Your health care provider may also give you more specific instructions. If you have problems or questions, contact your health care provider. What can I expect after the procedure? After the procedure, it is common to have:  Bruising and tenderness at the catheter insertion area. Follow these instructions at home: Medicines  Take over-the-counter and prescription medicines only as told by your health care provider. Insertion site care 1. Follow instructions from your health care provider about how to take care of your insertion site. Make sure you: ? Wash your hands with soap and water before you remove your bandage (dressing). If soap and water are not available, use hand sanitizer. ? May remove dressing in 24 hours. 2. Check your insertion site every day for signs of infection. Check for: ? Redness, swelling, or pain. ? Fluid or blood. ? Pus or a bad smell. ? Warmth. 3. Do no take baths, swim, or use a hot tub for 5 days. 4. You may shower 24-48 hours after the procedure. ? Remove the dressing and gently wash the site with plain soap and water. ? Pat the area dry with a clean towel. ? Do not rub the site. That could cause bleeding. 5. Do not apply powder or lotion to the site. Activity  1. For 24 hours after the procedure, or as directed by your health care provider: ? Do not flex or bend the affected arm. ? Do not push or pull heavy objects with the affected arm. ? Do not drive yourself home from the hospital or clinic. You may drive 24 hours after the procedure. ? Do not operate machinery or power tools. ? KEEP ARM ELEVATED THE REMAINDER OF THE DAY. 2. Do not push, pull or lift anything that is heavier than 10 lb for 5 days. 3. Ask your health care provider when it is okay to: ? Return to work or school. ? Resume usual physical activities or sports. ? Resume sexual  activity. General instructions  If the catheter site starts to bleed, raise your arm and put firm pressure on the site. If the bleeding does not stop, get help right away. This is a medical emergency.  DRINK PLENTY OF FLUIDS FOR THE NEXT 2-3 DAYS.  No alcohol consumption for 24 hours after receiving sedation.  If you went home on the same day as your procedure, a responsible adult should be with you for the first 24 hours after you arrive home.  Keep all follow-up visits as told by your health care provider. This is important. Contact a health care provider if:  You have a fever.  You have redness, swelling, or yellow drainage around your insertion site. Get help right away if:  You have unusual pain at the radial site.  The catheter insertion area swells very fast.  The insertion area is bleeding, and the bleeding does not stop when you hold steady pressure on the area.  Your arm or hand becomes pale, cool, tingly, or numb. These symptoms may represent a serious problem that is an emergency. Do not wait to see if the symptoms will go away. Get medical help right away. Call your local emergency services (911 in the [place] Do not drive yourself to the hospital. Summary  After the procedure, it is common to have bruising and tenderness at the site.  Follow instructions from your health care provider about how to take care   of your radial site wound. Check the wound every day for signs of infection.  This information is not intended to replace advice given to you by your health care provider. Make sure you discuss any questions you have with your health care provider. Document Revised: 08/11/2017 Document Reviewed: 08/11/2017 Elsevier Patient Education  2020 [employer] 

## 2020-10-17 ENCOUNTER — Telehealth: Payer: Self-pay | Admitting: Cardiovascular Disease

## 2020-10-17 NOTE — Telephone Encounter (Signed)
Spoke with the patient and his wife. They state yesterday in recovery, there was a lump "a little smaller than a golf ball" above his radial site for cath. He was discharged with instructions to keep an eye on it and to call if the lump worsened. He took the splint off this morning and the lump was harder than yesterday and throbbing. Since the splint was removed, the lump is getting softer and the pain is relieving.  He denies heat, drainage, swelling (other than the "lump"), or growth of the "lump" since yesterday. Instructed them to keep splint off, elevate and ice the site and to call if symptoms do not continue to improve.  The patient was grateful for call and agrees with plan.  Reviewed with DOD (Dr. Angelena Form) who agrees with plan as well.

## 2020-10-17 NOTE — Telephone Encounter (Signed)
Patient's wife states the patient had a heart cath yesterday. She states the nurses noticed a lump above the incision site. She states they were told to watch it and if it got hard to call back. She states it is hard today and throbbing, but not has hard as it was 15 minutes ago. She states it also has gone down some.

## 2020-10-23 ENCOUNTER — Encounter: Payer: Self-pay | Admitting: Physician Assistant

## 2020-10-23 ENCOUNTER — Other Ambulatory Visit: Payer: Self-pay

## 2020-10-23 ENCOUNTER — Ambulatory Visit: Payer: Medicare HMO | Admitting: Physician Assistant

## 2020-10-23 VITALS — BP 128/82 | HR 67 | Ht 68.0 in | Wt 180.0 lb

## 2020-10-23 DIAGNOSIS — E782 Mixed hyperlipidemia: Secondary | ICD-10-CM | POA: Diagnosis not present

## 2020-10-23 DIAGNOSIS — I25119 Atherosclerotic heart disease of native coronary artery with unspecified angina pectoris: Secondary | ICD-10-CM | POA: Diagnosis not present

## 2020-10-23 DIAGNOSIS — I10 Essential (primary) hypertension: Secondary | ICD-10-CM

## 2020-10-23 DIAGNOSIS — Z720 Tobacco use: Secondary | ICD-10-CM | POA: Diagnosis not present

## 2020-10-23 MED ORDER — METOPROLOL SUCCINATE ER 50 MG PO TB24: 50.0000 mg | ORAL_TABLET | Freq: Every day | ORAL | 3 refills | Status: DC

## 2020-10-23 NOTE — Progress Notes (Signed)
Cardiology Office Note:    Date:  10/23/2020   ID:  CHET GREENLEY, DOB [birth year], MRN [id number]  PCP:  [name], [name], MD  [facility] Cardiologist:  Sherren Mocha, MD  [facility] Electrophysiologist:  None   Chief Complaint: Cath follow up   History of Present Illness:    Derrick Stokes is a 70 y.o. male with a hx of CAD s/p CABG and PCI, HTN, COPD and HLD seen for cath follow up.   Coronary artery diseases/pCABG ? [place] 2016: S-OM, S-RCA 100; L-LAD, S-Dx ok; PCI: DES to LCx (leading into OM2); DES to RCA ? Chest pain >>Cath 8/19: 2/4 grafts patent; LCx and RCA stents patent  Seen by Dr. Burt Knack 10/07/20. He was having class III angina.   Cath 10/16/2020 as noted below showing showing stable anatomy. Moderate non obstructive mCx with negative FFR. Medical therapy recommended. Here   For follow-up with wife.  Continues to smoke.  Patient with prior history of right forearm injury requiring plates.  He had hematoma post cath prior radial access.  He is ecchymosis and hematoma is improving.  He denies palpitation, dizziness, orthopnea, PND, syncope or melena.  He continues to have intermittent substernal chest pain, especially while missing lopressor.   Past Medical History:  Diagnosis Date  . Arthritis   . B12 deficiency    Borderline  . CAD (coronary artery disease)    a. s/p CABG 01/2011;  b. ETT Myoview 3/14:  Low risk;  c. 09/2014 Cath/PCI: LM nl, LAD 100p, LCX 57m/OM2 60-70p (2.75x28 Synergy DES), RCA dom, 28m (atherectomy, 3.5x24 Synergy DES), PDA nl, LIMA->LAD nl, VG->Diag nl, VG->OM 100, VG->RCA 100.  . Cataract    bilateral sx   . COPD (chronic obstructive pulmonary disease) ([place])   . GERD (gastroesophageal reflux disease)    on meds  . Hiatal hernia   . History of echocardiogram    Echo 10/16: EF 50-55%, no RWMA, Gr 1 DD, mild MR, mild TR  . History of kidney stones    Passed  . HLD (hyperlipidemia)    on meds  . HOH (hard of hearing)   .  Hypertension    on meds  . Leg pain    ABIs 4/14:  R 1.2, L 1.2, TBIs normal  . Peripheral neuropathy    From trauma  . Reactive airway disease 03/18/2018  . Tobacco abuse     Past Surgical History:  Procedure Laterality Date  . COLONOSCOPY  03/09/2020   Neg  . COLONOSCOPY WITH PROPOFOL N/A 05/08/2020   Procedure: COLONOSCOPY WITH PROPOFOL;  Surgeon: Rush Landmark Telford Nab., MD;  Location: [place];  Service: Gastroenterology;  Laterality: N/A;  . CORONARY ARTERY BYPASS GRAFT  2013   x5  . ENDOSCOPIC MUCOSAL RESECTION N/A 05/08/2020   Procedure: ENDOSCOPIC MUCOSAL RESECTION;  Surgeon: Rush Landmark Telford Nab., MD;  Location: [place];  Service: Gastroenterology;  Laterality: N/A;  . EPIDURAL STEROIDS    . EYE SURGERY Bilateral    Cataract  . HEMOSTASIS CLIP PLACEMENT  05/08/2020   Procedure: HEMOSTASIS CLIP PLACEMENT;  Surgeon: Irving Copas., MD;  Location: [place];  Service: Gastroenterology;;  . HEMOSTASIS CONTROL  05/08/2020   Procedure: HEMOSTASIS CONTROL;  Surgeon: Irving Copas., MD;  Location: [place];  Service: Gastroenterology;;  . Cruzita Lederer PRESSURE WIRE/FFR STUDY N/A 03/17/2018   Procedure: INTRAVASCULAR PRESSURE WIRE/FFR STUDY;  Surgeon: Lorretta Harp, MD;  Location: [place] CV [facility];  Service: Cardiovascular;  Laterality: N/A;  .  LEFT HEART CATH AND CORONARY ANGIOGRAPHY N/A 03/17/2018   Procedure: LEFT HEART CATH AND CORONARY ANGIOGRAPHY;  Surgeon: Lorretta Harp, MD;  Location: [place] CV [facility];  Service: Cardiovascular;  Laterality: N/A;  . LEFT HEART CATH AND CORS/GRAFTS ANGIOGRAPHY N/A 10/16/2020   Procedure: LEFT HEART CATH AND CORS/GRAFTS ANGIOGRAPHY;  Surgeon: Sherren Mocha, MD;  Location: [place] CV LAB;  Service: Cardiovascular;  Laterality: N/A;  . LEFT HEART CATHETERIZATION WITH CORONARY/GRAFT ANGIOGRAM N/A 09/18/2014   Procedure: LEFT HEART CATHETERIZATION WITH [name];  Surgeon: [name], MD;  Location: [facility] CATH LAB;  Service: Cardiovascular;  Laterality: N/A;  . PERCUTANEOUS CORONARY ROTOBLATOR INTERVENTION (PCI-R) N/A 09/20/2014   Procedure: PERCUTANEOUS CORONARY ROTOBLATOR INTERVENTION (PCI-R);  Surgeon: [name], MD;  Location: [facility] CATH LAB;  Service: Cardiovascular;  Laterality: N/A;  . POLYPECTOMY  02/28/2020   6 polyps/tics/hems  . POLYPECTOMY  05/08/2020   Procedure: POLYPECTOMY;  Surgeon: Mansouraty, Telford Nab., MD;  Location: [place];  Service: Gastroenterology;;  . Clide Deutscher  05/08/2020   Procedure: Clide Deutscher;  Surgeon: Irving Copas., MD;  Location: [place];  Service: Gastroenterology;;  . Maryagnes Amos INJECTION  05/08/2020   Procedure: SUBMUCOSAL LIFTING INJECTION;  Surgeon: Irving Copas., MD;  Location: [place];  Service: Gastroenterology;;    Current Medications: Current Meds  Medication Sig  . aspirin EC 81 MG tablet Take 81 mg by mouth at bedtime.   [name] atorvastatin (LIPITOR) 80 MG tablet TAKE 1 TABLET (80 MG TOTAL) BY MOUTH DAILY AT 6 PM.  . clopidogrel (PLAVIX) 75 MG tablet TAKE 1 TABLET BY MOUTH EVERY DAY WITH BREAKFAST (Patient taking differently: Take 75 mg by mouth daily.)  . metoprolol succinate (TOPROL-XL) 50 MG 24 hr tablet Take 1 tablet (50 mg total) by mouth daily. Take with or immediately following a meal.  . nitroGLYCERIN (NITROSTAT) 0.4 MG SL tablet PLACE 1 TABLET UNDER THE TONGUE EVERY 5 MINUTES X 3 DOSES AS NEEDED FOR CHEST PAIN. (Patient taking differently: Place 0.4 mg under the tongue every 5 (five) minutes as needed for chest pain.)  . Omega-3 Fatty Acids (FISH OIL) 1000 MG CPDR Take 1,000 mg by mouth daily.   . ondansetron (ZOFRAN ODT) 8 MG disintegrating tablet Take 1 tablet (8 mg total) by mouth every 8 (eight) hours as needed for nausea or vomiting.  . pantoprazole (PROTONIX) 40 MG tablet TAKE 1 TABLET BY MOUTH EVERY DAY (Patient taking differently: Take 40 mg by mouth  daily.)  . vitamin B-12 (CYANOCOBALAMIN) 1000 MCG tablet Take 1,000 mcg by mouth daily.   . [DISCONTINUED] metoprolol tartrate (LOPRESSOR) 25 MG tablet Take 12.5 mg by mouth 2 (two) times daily.     Allergies:   Iodine, Iohexol, Shrimp [shellfish allergy], and Isosorbide dinitrate   Social History   Socioeconomic History  . Marital status: Married    Spouse name: Not on file  . Number of children: Not on file  . Years of education: Not on file  . Highest education level: Not on file  Occupational History  . Occupation: Di[employer]ability  Tobacco Use  . Smoking status: Current Every Day Smoker    Packs/day: 1.00    Years: 51.00    Pack years: 51.00    Types: Cigarettes    Start date: 69  . Smokeless tobacco: Never Used  Vaping Use  . Vaping Use: Never used  Substance and Sexual Activity  . Alcohol use: Yes    Alcohol/week: 3.0 standard drinks  Types: 3 Cans of beer per week  . Drug use: No  . Sexual activity: Yes  Other Topics Concern  . Not on file  Social History Narrative   Married   Gets regular exercise - walks 1/4 mile per day 5 times a week without symptoms   Social Determinants of Radio broadcast assistant Strain: Not on file  Food Insecurity: Not on file  Transportation Needs: Not on file  Physical Activity: Not on file  Stress: Not on file  Social Connections: Not on file     Family History: The patient's family history includes Cancer in his mother; Diabetes in his brother, brother, and father; Heart attack (age of onset: 26) in his father; Heart disease in his father. There is no history of Colon cancer, Esophageal cancer, Stomach cancer, Pancreatic cancer, Inflammatory bowel disease, Liver disease, Rectal cancer, or Colon polyps.    ROS:   Please see the history of present illness.    All other systems reviewed and are negative.   EKGs/Labs/Other Studies Reviewed:    The following studies were reviewed today: * LEFT HEART CATH AND CORS/GRAFTS  ANGIOGRAPHY  10/16/20    Conclusion  1.  Multivessel coronary artery disease with chronic total occlusion of the proximal LAD, continued patency with moderate in-stent restenosis in the left circumflex, wide patency of the first OM branch, and patency of the dominant RCA with mild nonobstructive in-stent restenosis 2.  Status post aortocoronary bypass surgery with continued patency of the saphenous vein graft to first diagonal and LIMA to LAD, other vein grafts are known to be occluded to the RCA and circumflex territories  Recommendations: Ongoing medical therapy.  Stable coronary anatomy noted when compared side-by-side with previous coronary angiogram from 2019.  I do not see any targets for intervention.  The moderate nonobstructive appearing lesion in the mid circumflex was previously interrogated with FFR and this was negative.  This lesion has not progressed.  Diagnostic Dominance: Right    EKG:  EKG is not ordered today.   Recent Labs: 02/05/2020: ALT 36 10/07/2020: BUN 12; Creatinine, Ser 0.81; Hemoglobin 16.5; Platelets 261; Potassium 4.9; Sodium 136  Recent Lipid Panel    Component Value Date/Time   CHOL 139 09/20/2020 0858   CHOL 133 03/08/2019 0905   TRIG 104.0 09/20/2020 0858   HDL 43.20 09/20/2020 0858   HDL 36 (L) 03/08/2019 0905   CHOLHDL 3 09/20/2020 0858   VLDL 20.8 09/20/2020 0858   LDLCALC 75 09/20/2020 0858   LDLCALC 101 (H) 02/05/2020 1417   LDLDIRECT 68.0 11/19/2014 0737    Physical Exam:    VS:  BP 128/82   Pulse 67   Ht 5\' 8"  (1.727 m)   Wt 180 lb (81.6 kg)   SpO2 97%   BMI 27.37 kg/m     Wt Readings from Last 3 Encounters:  10/23/20 180 lb (81.6 kg)  10/16/20 175 lb (79.4 kg)  10/07/20 178 lb 12.8 oz (81.1 kg)     GEN: Well nourished, well developed in no acute distress HEENT: Normal NECK: No JVD; No carotid bruits LYMPHATICS: No lymphadenopathy CARDIAC: RRR, no murmurs, rubs, gallops.  Right radial cath site with mild ecchymosis and  hematoma RESPIRATORY:  Clear to auscultation without rales, wheezing or rhonchi  ABDOMEN: Soft, non-tender, non-distended MUSCULOSKELETAL:  No edema; No deformity  SKIN: Warm and dry NEUROLOGIC:  Alert and oriented x 3 PSYCHIATRIC:  Normal affect   ASSESSMENT AND PLAN:    1.  Stable  angina with CAD -Cardiac catheterization showed stable anatomy.  He continues to have intermittent chest pain which is anginal equivalent.  He never required to take sublingual nitroglycerin.  Previously intolerance to Imdur secondary to headache. -Continue dual antiplatelet therapy with aspirin and Plavix -Continue Lipitor 80 mg daily -Change metoprolol tartrate to succinate for better compliance antianginal effect  2.  Hyperlipidemia - 09/20/2020: Cholesterol 139; HDL 43.20; LDL Cholesterol 75; Triglycerides 104.0; VLDL 20.8  -Continue high intensity statin  3.  Hypertension -Blood pressure stable  4.  Tobacco abuse -Recommended cessation but patient is not interested  Medication Adjustments/Labs and Tests Ordered: Current medicines are reviewed at length with the patient today.  Concerns regarding medicines are outlined above.  No orders of the defined types were placed in this encounter.  Meds ordered this encounter  Medications  . metoprolol succinate (TOPROL-XL) 50 MG 24 hr tablet    Sig: Take 1 tablet (50 mg total) by mouth daily. Take with or immediately following a meal.    Dispense:  90 tablet    Refill:  3    Patient Instructions  Medication Instructions:  Your physician has recommended you make the following change in your medication:  1.  STOP Lopressor (Metoprolol) 2.  START Toprol XL 50 mg taking 1 daily  .  *If you need a refill on your cardiac medications before your next appointment, please call your pharmacy*   Lab Work: None ordered  If you have labs (blood work) drawn today and your tests are completely normal, you will receive your results only by: [name] MyChart Message  (if you have MyChart) OR . A paper copy in the mail If you have any lab test that is abnormal or we need to change your treatment, we will call you to review the results.   Testing/Procedures: None ordered   Follow-Up: At [facility], you and your health needs are our priority.  As part of our continuing mission to provide you with exceptional heart care, we have created designated Provider Care Teams.  These Care Teams include your primary Cardiologist (physician) and Advanced Practice Providers (APPs -  Physician Assistants and Nurse Practitioners) who all work together to provide you with the care you need, when you need it.  We recommend signing up for the patient portal called "MyChart".  Sign up information is provided on this After Visit Summary.  MyChart is used to connect with patients for Virtual Visits (Telemedicine).  Patients are able to view lab/test results, encounter notes, upcoming appointments, etc.  Non-urgent messages can be sent to your provider as well.   To learn more about what you can do with MyChart, go to [email or web address].    Your next appointment:   4 month(s)  The format for your next appointment:   In Person  Provider:   You may see Sherren Mocha, MD or one of the following Advanced Practice Providers on your designated Care Team:    Robbie Lis, PA-C    Other Instructions       Signed, Leanor Kail, [place]  10/23/2020 2:54 PM    [place]

## 2020-10-23 NOTE — Patient Instructions (Addendum)
Medication Instructions:  Your physician has recommended you make the following change in your medication:  1.  STOP Lopressor (Metoprolol) 2.  START Toprol XL 50 mg taking 1 daily  .  *If you need a refill on your cardiac medications before your next appointment, please call your pharmacy*   Lab Work: None ordered  If you have labs (blood work) drawn today and your tests are completely normal, you will receive your results only by: [name] MyChart Message (if you have MyChart) OR . A paper copy in the mail If you have any lab test that is abnormal or we need to change your treatment, we will call you to review the results.   Testing/Procedures: None ordered   Follow-Up: At [facility], you and your health needs are our priority.  As part of our continuing mission to provide you with exceptional heart care, we have created designated Provider Care Teams.  These Care Teams include your primary Cardiologist (physician) and Advanced Practice Providers (APPs -  Physician Assistants and Nurse Practitioners) who all work together to provide you with the care you need, when you need it.  We recommend signing up for the patient portal called "MyChart".  Sign up information is provided on this After Visit Summary.  MyChart is used to connect with patients for Virtual Visits (Telemedicine).  Patients are able to view lab/test results, encounter notes, upcoming appointments, etc.  Non-urgent messages can be sent to your provider as well.   To learn more about what you can do with MyChart, go to [email or web address].    Your next appointment:   4 month(s)  The format for your next appointment:   In Person  Provider:   You may see Sherren Mocha, MD or one of the following Advanced Practice Providers on your designated Care Team:    Robbie Lis, [place]    Other Instructions

## 2020-10-24 ENCOUNTER — Other Ambulatory Visit: Payer: Self-pay | Admitting: Cardiovascular Disease

## 2020-10-24 DIAGNOSIS — E785 Hyperlipidemia, unspecified: Secondary | ICD-10-CM

## 2021-01-01 ENCOUNTER — Telehealth: Payer: Self-pay | Admitting: Family Medicine

## 2021-01-01 NOTE — Chronic Care Management (AMB) (Signed)
  Chronic Care Management   Note  01/01/2021 Name: Derrick Stokes MRN: [id number] DOB: 10-13-1950  Derrick Stokes is a 69 y.o. year old male who is a primary care patient of [name], Malka So, MD. I reached out to Jerelyn Scott by phone today in response to a referral sent by Derrick Stokes's PCP, [name], [name], MD.   [name] was given information about Chronic Care Management services today including:  CCM service includes personalized support from designated clinical staff supervised by his physician, including individualized plan of care and coordination with other care providers 24/7 contact phone numbers for assistance for urgent and routine care needs. Service will only be billed when office clinical staff spend 20 minutes or more in a month to coordinate care. Only one practitioner may furnish and bill the service in a calendar month. The patient may stop CCM services at any time (effective at the end of the month) by phone call to the office staff.   Patient agreed to services and verbal consent obtained.   Follow up plan:   Tatjana Secretary/administrator

## 2021-02-04 ENCOUNTER — Other Ambulatory Visit: Payer: Self-pay | Admitting: Cardiovascular Disease

## 2021-02-21 ENCOUNTER — Telehealth: Payer: Self-pay | Admitting: Pharmacist

## 2021-02-21 NOTE — Chronic Care Management (AMB) (Signed)
Chronic Care Management Pharmacy Assistant   Name: Derrick Stokes  MRN: [id number] DOB: 1951/03/24   Reason for Encounter: Chart review for Clinical Pharmacist on 02-28-2021   Conditions to be addressed/monitored: CAD, HLD, Anxiety, and Derrick Stokes  Recent office visits:  09-20-2020 [name], [name], MD - Patient presented for [facility] Annual Wellness visit. Stopped PLENVU  Recent consult visits:  10-23-2020 Derrick Stokes, [place] (Cardiology)  - Patient presented for Coronary artery disease involving native coronary artery of native heart with angina pectoris and other concerns. Prescribed Metoprolol Succinate '50mg'$  daily Stopped Metoprolol Tartrate.  10-07-2020 Sherren Mocha, MD (Cardiology) - Patient presented for Essential hypertension and other issues. No medication changes.   10-01-2020 Derrick Stokes (Audiology) - Patient presented for hearing loss and other issues. No medication changes found.   Hospital visits:  Medication Reconciliation was completed by comparing discharge summary, patient's EMR and Pharmacy list, and upon discussion with patient.  Patient presented to [facility] on 10-16-2020 due to cath procedure. He was there for 5 hours  New?Medications Started at [facility] Discharge:?? -started None  Medication Changes at Hospital Discharge: -Changed None  Medications Discontinued at Hospital Discharge: -Stopped None  Medications that remain the same after Hospital Discharge:??  -All other medications will remain the same.    Medications: Outpatient Encounter Medications as of 02/21/2021  Medication Sig   aspirin EC 81 MG tablet Take 81 mg by mouth at bedtime.    atorvastatin (LIPITOR) 80 MG tablet TAKE 1 TABLET (80 MG TOTAL) BY MOUTH DAILY AT 6 PM.   clopidogrel (PLAVIX) 75 MG tablet TAKE 1 TABLET BY MOUTH EVERY DAY WITH BREAKFAST   metoprolol succinate (TOPROL-XL) 50 MG 24 hr tablet Take 1 tablet (50 mg total) by mouth daily. Take with or immediately  following a meal.   nitroGLYCERIN (NITROSTAT) 0.4 MG SL tablet PLACE 1 TABLET UNDER THE TONGUE EVERY 5 MINUTES X 3 DOSES AS NEEDED FOR CHEST PAIN. (Patient taking differently: Place 0.4 mg under the tongue every 5 (five) minutes as needed for chest pain.)   Omega-3 Fatty Acids (FISH OIL) 1000 MG CPDR Take 1,000 mg by mouth daily.    ondansetron (ZOFRAN ODT) 8 MG disintegrating tablet Take 1 tablet (8 mg total) by mouth every 8 (eight) hours as needed for nausea or vomiting.   pantoprazole (PROTONIX) 40 MG tablet TAKE 1 TABLET BY MOUTH EVERY DAY   vitamin B-12 (CYANOCOBALAMIN) 1000 MCG tablet Take 1,000 mcg by mouth daily.    No facility-administered encounter medications on file as of 02/21/2021.   Call to patient spoke to his wife Derrick Stokes who handles most of his care Have you seen any other providers since your last visit? She reports no  Any changes in your medications or health? She reports no  Any side effects from any medications? She reports not that she has noticed, reports she noticed when he had a colonoscopy done and was advised to hold on his Protonix he was experiencing stomach upset but was fine once he was back on it.  Do you have an symptoms or problems not managed by your medications? She reports not that she thinks all medications are working appropriately.  Any concerns about your health right now? She reports no She reports he is doing well now compared to in the past.  Has your provider asked that you check blood pressure, blood sugar, or follow special diet at home? She reports yes he is supposed to be checking his pressure at  home. Advised when they first got the cuff she was checking it for him daily, however as of late has not been doing at all as she forgets to can not recall last time she checked it. Advised her to put it in a location that will be an easy reminder like with his medications and to start keeping a log to have available for appointment coming up. She was in  agreement to that.   Do you get any type of exercise on a regular basis? She reports that he gets around well for the most part but often has pain in feet and legs and when they start to hurt him he likes to rest will often work in his shop at home. She reports he eats 2 meals daily will have a sandwich for breakfast has recently been avoiding eggs for some reason. If he has lunch will not eat dinner will usually have a meat and small amount of vegetables, is hard to get him to eat them. She reports he does not drink any water and fruit juice upsets his stomach. Drinks mostly beer or a pepsi and on occasion milk.  Can you think of a goal you would like to reach for your health? She reports he mentioned to her that he would like to start taking walks but when she asks him to go he says no she thinks he is worried about pain he will have in legs in feet if he goes.   Do you have any problems getting your medications? She reports she is happy with the pharmacy , his insurance covers everything full cost and it is less than a mile from the home.  Is there anything that you would like to discuss during the appointment?  She reports he is an early riser and often takes a early afternoon nap and that turns into him sleeping through the night and he has been missing his atorvastatin and Asprin as they are the two medications he takes at night. She has stressed the importance of the two medications to him and tries to wake him and have him take the two medications.   She is aware to have medications and BP logs available for appointment and that she will have a reminder call the day prior.   Fill History : ATORVASTATIN 80 MG TABLET 12/19/2020 90    CLOPIDOGREL 75 MG TABLET 02/04/2021 90   METOPROLOL SUCC ER 50 MG TAB 01/20/2021 90   NITROGLYCERN SUB 0.4 02/26/2020 25   ONDANSETRON ODT 8 MG TABLET 02/15/2020 10   PANTOPRAZOLE SOD DR 40 MG TAB 02/04/2021 90   Care Gaps:  Urine Micro -  Overdue Zoster Vaccine - Overdue COVID Booster #3 [name]) - Overdue Flu Vaccine - Overdue AWV done 09-20-20 - MSG sent to Ramond Craver CMA to schedule future  Star Rating Drugs:   Atorvastatin '80mg'$  - Last filled 12-19-2020 90 DS at [facility] Pharmacist Assistant [phone]

## 2021-02-27 ENCOUNTER — Telehealth: Payer: Self-pay | Admitting: Pharmacist

## 2021-02-27 NOTE — Progress Notes (Signed)
    Chronic Care Management Pharmacy Assistant   Name: Derrick Stokes  MRN: [id number] DOB: 1951/03/01  02-27-2021- Patient called to remind of appointment with Jeni Salles Clinical Pharmacist on 02-28-2021 at 9 am in office.  Call to Patient spoke to his wife she confirmed appointment date, time, and type of appointment. She is  aware to bring all medications, supplements, blood pressure and blood sugar logs to visit. She agreed.   Care Gaps: Urine Micro - Overdue Zoster Vaccine - Overdue COVID Booster #3 [name]) - Overdue Flu Vaccine - Overdue AWV done 09-20-20 - MSG sent to Ramond Craver CMA to schedule future  Star Rating Drug:  Atorvastatin '80mg'$  - [facility]  Any gaps in medications fill history? None   Medications: Outpatient Encounter Medications as of 02/27/2021  Medication Sig   aspirin EC 81 MG tablet Take 81 mg by mouth at bedtime.    atorvastatin (LIPITOR) 80 MG tablet TAKE 1 TABLET (80 MG TOTAL) BY MOUTH DAILY AT 6 PM.   clopidogrel (PLAVIX) 75 MG tablet TAKE 1 TABLET BY MOUTH EVERY DAY WITH BREAKFAST   metoprolol succinate (TOPROL-XL) 50 MG 24 hr tablet Take 1 tablet (50 mg total) by mouth daily. Take with or immediately following a meal.   nitroGLYCERIN (NITROSTAT) 0.4 MG SL tablet PLACE 1 TABLET UNDER THE TONGUE EVERY 5 MINUTES X 3 DOSES AS NEEDED FOR CHEST PAIN. (Patient taking differently: Place 0.4 mg under the tongue every 5 (five) minutes as needed for chest pain.)   Omega-3 Fatty Acids (FISH OIL) 1000 MG CPDR Take 1,000 mg by mouth daily.    ondansetron (ZOFRAN ODT) 8 MG disintegrating tablet Take 1 tablet (8 mg total) by mouth every 8 (eight) hours as needed for nausea or vomiting.   pantoprazole (PROTONIX) 40 MG tablet TAKE 1 TABLET BY MOUTH EVERY DAY   vitamin B-12 (CYANOCOBALAMIN) 1000 MCG tablet Take 1,000 mcg by mouth daily.    No facility-administered encounter medications on file as of 02/27/2021.   [place] Clinical  Pharmacist Assistant [phone]

## 2021-02-28 ENCOUNTER — Other Ambulatory Visit: Payer: Self-pay

## 2021-02-28 ENCOUNTER — Ambulatory Visit (INDEPENDENT_AMBULATORY_CARE_PROVIDER_SITE_OTHER): Payer: Medicare HMO | Admitting: Pharmacist

## 2021-02-28 DIAGNOSIS — E1169 Type 2 diabetes mellitus with other specified complication: Secondary | ICD-10-CM

## 2021-02-28 DIAGNOSIS — Z720 Tobacco use: Secondary | ICD-10-CM

## 2021-02-28 DIAGNOSIS — E785 Hyperlipidemia, unspecified: Secondary | ICD-10-CM | POA: Diagnosis not present

## 2021-02-28 MED ORDER — EZETIMIBE 10 MG PO TABS: 10.0000 mg | ORAL_TABLET | Freq: Every day | ORAL | 3 refills | Status: DC

## 2021-02-28 NOTE — Progress Notes (Signed)
Chronic Care Management Pharmacy Note  03/13/2021 Name:  UNDRA HARRIMAN MRN:  [id number] DOB:  [birth year]  Summary: LDL not at goal < 70  Recommendations/Changes made from today's visit: -Recommended administering all medications at the same time to improve adherence -Recommended scheduling office visit with PCP to discuss medications for neuropathy -Recommended to restart ezetimibe 10 mg daily as cost will be cheaper at [place]: Follow up tolerance assessment in 1 month   Subjective: TERREL NESHEIWAT is an 70 y.o. year old male who is a primary patient of [name], Malka So, MD.  The CCM team was consulted for assistance with disease management and care coordination needs.    Engaged with patient face to face for initial visit in response to provider referral for pharmacy case management and/or care coordination services.   Consent to Services:  The patient was given the following information about Chronic Care Management services today, agreed to services, and gave verbal consent: 1. CCM service includes personalized support from designated clinical staff supervised by the primary care provider, including individualized plan of care and coordination with other care providers 2. 24/7 contact phone numbers for assistance for urgent and routine care needs. 3. Service will only be billed when office clinical staff spend 20 minutes or more in a month to coordinate care. 4. Only one practitioner may furnish and bill the service in a calendar month. 5.The patient may stop CCM services at any time (effective at the end of the month) by phone call to the office staff. 6. The patient will be responsible for cost sharing (co-pay) of up to 20% of the service fee (after annual deductible is met). Patient agreed to services and consent obtained.  Patient Care Team: [name], [name], MD as PCP - General (Family Medicine) Sherren Mocha, MD as PCP - Cardiology (Cardiology) Viona Gilmore, [facility] as Pharmacist (Pharmacist)  Recent office visits: 09-20-2020 [name], [name], MD - Patient presented for [facility] Annual Wellness visit. Stopped PLENVU  Recent consult visits: 10-23-2020 Leanor Kail, [place] (Cardiology)  - Patient presented for Coronary artery disease involving native coronary artery of native heart with angina pectoris and other concerns. Prescribed Metoprolol Succinate 11m daily Stopped Metoprolol Tartrate.   10-07-2020 CSherren Mocha MD (Cardiology) - Patient presented for Essential hypertension and other issues. No medication changes.    10-01-2020 JEulah Pont(Audiology) - Patient presented for hearing loss and other issues. No medication changes found.  Hospital visits: Medication Reconciliation was completed by comparing discharge summary, patient's EMR and Pharmacy list, and upon discussion with patient.   [facility]. He was there for 5 hours   [facility] -[facility] None   Medication Changes at Hospital Discharge: -Changed None   Medications Discontinued at Hospital Discharge: -Stopped None   Medications that remain the same after Hospital Discharge:??  -All other medications will remain the same.     Objective:  Lab Results  Component Value Date   CREATININE 0.81 10/07/2020   BUN 12 10/07/2020   GFR 91.61 09/20/2020   GFRNONAA 95 03/08/2019   GFRAA 109 03/08/2019   NA 136 10/07/2020   K 4.9 10/07/2020   CALCIUM 9.6 10/07/2020   CO2 21 10/07/2020   GLUCOSE 105 (H) 10/07/2020    Lab Results  Component Value Date/Time   HGBA1C 5.3 09/14/2018 08:06 AM   HGBA1C 5.5 06/08/2016 09:44 AM   GFR 91.61 09/20/2020  08:58 AM   GFR 114.61 09/20/2017 08:55 AM    Last diabetic Eye exam: No results found for: HMDIABEYEEXA  Last diabetic Foot exam: No results found for: HMDIABFOOTEX   Lab Results  Component Value Date   CHOL  139 09/20/2020   HDL 43.20 09/20/2020   LDLCALC 75 09/20/2020   LDLDIRECT 68.0 11/19/2014   TRIG 104.0 09/20/2020   CHOLHDL 3 09/20/2020    Hepatic Function Latest Ref Rng & Units 02/05/2020 03/08/2019 12/29/2018  Total Protein 6.1 - 8.1 g/dL 7.0 7.0 6.9  Albumin 3.8 - 4.8 g/dL - 4.3 3.7  AST 10 - 35 U/L '24 22 25  ' ALT 9 - 46 U/L 36 44 49(H)  Alk Phosphatase 39 - 117 IU/L - 80 64  Total Bilirubin 0.2 - 1.2 mg/dL 1.0 0.9 0.4  Bilirubin, Direct 0.00 - 0.40 mg/dL - 0.23 -    Lab Results  Component Value Date/Time   TSH 3.180 09/16/2014 06:15 PM   TSH 0.78 10/12/2012 01:09 PM    CBC Latest Ref Rng & Units 10/07/2020 02/05/2020 12/29/2018  WBC 3.4 - 10.8 x10E3/uL 7.0 8.5 7.0  Hemoglobin 13.0 - 17.7 g/dL 16.5 16.2 15.7  Hematocrit 37.5 - 51.0 % 47.8 47.5 45.4  Platelets 150 - 450 x10E3/uL 261 245 230    No results found for: VD25OH  Clinical ASCVD: Yes  The 10-year ASCVD risk score Mikey Bussing DC Jr., et al., 2013) is: 35.4%   Values used to calculate the score:     Age: 70 years     Sex: Male     Is Non-Hispanic African American: No     Diabetic: Yes     Tobacco smoker: Yes     Systolic Blood Pressure: 384 mmHg     Is BP treated: Yes     HDL Cholesterol: 43.2 mg/dL     Total Cholesterol: 139 mg/dL    Depression screen [facility] 2/9 09/20/2020 09/18/2019  Decreased Interest 0 0  Down, Depressed, Hopeless 0 0  PHQ - 2 Score 0 0      Social History   Tobacco Use  Smoking Status Every Day   Packs/day: 1.00   Years: 51.00   Pack years: 51.00   Types: Cigarettes   Start date: 1970  Smokeless Tobacco Never   BP Readings from Last 3 Encounters:  10/23/20 128/82  10/16/20 102/65  10/07/20 110/80   Pulse Readings from Last 3 Encounters:  10/23/20 67  10/16/20 (!) 53  10/07/20 (!) 58   Wt Readings from Last 3 Encounters:  10/23/20 180 lb (81.6 kg)  10/16/20 175 lb (79.4 kg)  10/07/20 178 lb 12.8 oz (81.1 kg)   BMI Readings from Last 3 Encounters:  10/23/20 27.37 kg/m   10/16/20 26.61 kg/m  10/07/20 27.19 kg/m    Assessment/Interventions: Review of patient past medical history, allergies, medications, health status, including review of consultants reports, laboratory and other test data, was performed as part of comprehensive evaluation and provision of chronic care management services.   SDOH:  (Social Determinants of Health) assessments and interventions performed: Yes SDOH Interventions    Flowsheet Row Most Recent Value  SDOH Interventions   Financial Strain Interventions Other (Comment)  [switching pharmacies to cut down on cost of Ezetimibe]  Transportation Interventions Intervention Not Indicated      SDOH Screenings   Alcohol Screen: Not on file  Depression (PHQ2-9): Low Risk    PHQ-2 Score: 0  Financial Resource Strain: Low Risk    Difficulty of  Paying Living Expenses: Not very hard  Food Insecurity: Not on file  Housing: Not on file  Physical Activity: Not on file  Social Connections: Not on file  Stress: Not on file  Tobacco Use: High Risk   Smoking Tobacco Use: Every Day   Smokeless Tobacco Use: Never  Transportation Needs: No Transportation Needs   Lack of Transportation (Medical): No   Lack of Transportation (Non-Medical): No   Patient presented to visit with his wife as she helps him manage his medications and "translates" as he has a hard time hearing.   Patient is pretty active during the day outside mowing the grass but he is not doing any formal exercise. He does get up pretty early around 2-3 am and goes to bed around 4-5pm, sometimes waking up after a couple of hours for dinner. He doesn't have problems with sleep but gets tired in the early evening and sleeps a lot.  He usually watches TV before going to bed.  Patient usually eats one meal a day because he isn't very hungry, especially when its hot outside. He usually only eats breakfast but some evenings he does eat dinner. He only drinks coffee and beer throughout  the day. Probably > 6 beers every day and part of this is to help him forget about his pain. He has pretty severe leg pain/neuropathy and is not drinking to make the pain go away but rather to distract him from the pain. He is also smoking cigarettes for the same reason. Discussed the importance of drinking water to avoid dehydration.   CCM Care Plan  Allergies  Allergen Reactions   Iodine Nausea And Vomiting    Turns white in face and sweaty   Iohexol Other (See Comments)    May have caused nausea and vomiting several years ago    Shrimp [Shellfish Allergy] Nausea And Vomiting    Turns white in face and sweaty   Isosorbide Dinitrate Other (See Comments)    headache    Medications Reviewed Today     Reviewed by Viona Gilmore, [facility] (Pharmacist) on 02/28/21 at 1014  Med List Status: <None>   Medication Order Taking? Sig Documenting Provider Last Dose Status Informant  aspirin EC 81 MG tablet [id number] Yes Take 81 mg by mouth at bedtime.  [provider] Taking Active Spouse/Significant Other  atorvastatin (LIPITOR) 80 MG tablet [id number] Yes TAKE 1 TABLET (80 MG TOTAL) BY MOUTH DAILY AT 6 PM. Sherren Mocha, MD Taking Active   clopidogrel (PLAVIX) 75 MG tablet [id number] Yes TAKE 1 TABLET BY MOUTH EVERY DAY WITH Violet Baldy, MD Taking Active   metoprolol succinate (TOPROL-XL) 50 MG 24 hr tablet [id number] Yes Take 1 tablet (50 mg total) by mouth daily. Take with or immediately following a meal. Bhagat, Bhavinkumar, PA Taking Active   nitroGLYCERIN (NITROSTAT) 0.4 MG SL tablet [id number]  PLACE 1 TABLET UNDER THE TONGUE EVERY 5 MINUTES X 3 DOSES AS NEEDED FOR CHEST PAIN.  Patient taking differently: Place 0.4 mg under the tongue every 5 (five) minutes as needed for chest pain.   [name], [name], MD  Active            Med Note Luana Shu, NATASHA   Wed Oct 23, 2020  2:07 PM)    Omega-3 Fatty Acids (FISH OIL) 1000 MG CPDR [id number] Yes Take 1,000 mg by mouth daily.   [provider] Taking Active Spouse/Significant Other  ondansetron (ZOFRAN ODT) 8 MG disintegrating tablet [id number] Yes Take  1 tablet (8 mg total) by mouth every 8 (eight) hours as needed for nausea or vomiting. Mansouraty, Telford Nab., MD Taking Active Spouse/Significant Other  pantoprazole (PROTONIX) 40 MG tablet [id number] Yes TAKE 1 TABLET BY MOUTH EVERY DAY Sherren Mocha, MD Taking Active   vitamin B-12 (CYANOCOBALAMIN) 1000 MCG tablet [id number] Yes Take 1,000 mcg by mouth daily.  [provider] Taking Active Spouse/Significant Other            Patient Active Problem List   Diagnosis Date Noted   Regurgitation of food 02/18/2020   Barrett's esophagus without dysplasia 02/18/2020   Bloating 02/18/2020   History of adenomatous polyp of colon 02/18/2020   Change in bowel habits 02/18/2020   Hiatal hernia 02/18/2020   Claudication ([place]) 09/18/2019   Coronary artery disease involving native coronary artery of native heart with angina pectoris ([place]) 09/18/2019   Chest pain wtih stable CAD at cath.   03/18/2018   Reactive airway disease 03/18/2018   Coronary artery disease with unstable angina pectoris ([place]) 03/16/2018   Reactive airway disease that is not asthma, has been coughing more, may be cause of chest pain 09/08/2016   B12 deficiency 06/08/2016   GERD (gastroesophageal reflux disease) 06/08/2016   Low back pain radiating to both legs 06/08/2016   Peripheral neuropathic pain 06/08/2016   CAD (coronary artery disease), native coronary artery 09/17/2014   Unstable angina (HCC) 09/16/2014   Tobacco abuse 01/03/2014   S/P coronary artery bypass graft x 5 03/06/2011   Hyperlipidemia 78/29/5621   HELICOBACTER PYLORI INFECTION 11/05/2008   BARRETTS ESOPHAGUS 11/05/2008   HIATAL HERNIA 11/05/2008   DIVERTICULOSIS, COLON 11/05/2008   COLONIC POLYPS, HX OF 11/05/2008   GASTRITIS 10/22/2008   Anxiety state 10/12/2008   EARLY SATIETY 10/12/2008   CHANGE IN  BOWELS 10/12/2008   ABDOMINAL PAIN -GENERALIZED 10/12/2008   ABDOMINAL PAIN, LEFT UPPER QUADRANT 10/01/2008    Immunization History  Administered Date(s) Administered   Influenza, High Dose Seasonal PF 09/14/2018, 04/25/2019, 04/23/2020   Influenza,inj,Quad PF,6+ Mos 04/17/2014, 05/06/2015, 06/08/2016   Influenza,inj,Quad PF,6-35 Mos 04/25/2019   Moderna Sars-Covid-2 Vaccination 08/15/2019, 09/12/2019   Pneumococcal Conjugate-13 09/14/2018   Pneumococcal Polysaccharide-23 06/08/2016   Tdap 05/01/2014   Tetanus 04/17/2014   Zoster, Live 05/01/2014    Conditions to be addressed/monitored:  Hyperlipidemia, Coronary Artery Disease, GERD, and Tobacco use  Care Plan : [place]  Updates made by Viona Gilmore, [place] since 03/13/2021 12:00 AM     Problem: Problem: Hyperlipidemia, Coronary Artery Disease, GERD, and Tobacco use      Long-Range Goal: Patient-Specific Goal   Start Date: 02/28/2021  Expected End Date: 02/28/2022  This Visit's Progress: On track  Priority: High  Note:   Current Barriers:  Unable to independently afford treatment regimen Unable to independently monitor therapeutic efficacy Unable to achieve control of cholesterol  Unable to self administer medications as prescribed  Pharmacist Clinical Goal(s):  Patient will verbalize ability to afford treatment regimen achieve adherence to monitoring guidelines and medication adherence to achieve therapeutic efficacy achieve control of cholesterol as evidenced by next lipid panel  through collaboration with PharmD and provider.   Interventions: 1:1 collaboration with [name], [name], MD regarding development and update of comprehensive plan of care as evidenced by provider attestation and co-signature Inter-disciplinary care team collaboration (see longitudinal plan of care) Comprehensive medication review performed; medication list updated in electronic medical record  Hyperlipidemia: (LDL goal <  70) -Uncontrolled -Current treatment: Atorvastatin 80 mg 1  tablet daily Fish oil 1200 mg  1 capsule daily -Medications previously tried: Zetia (did not start due to cost)  -Current dietary patterns: eating only breakfast most days -Current exercise habits: active during the day; no formal exercise -Educated on Cholesterol goals;  Benefits of statin for ASCVD risk reduction; Importance of limiting foods high in cholesterol; Exercise goal of 150 minutes per week; -Counseled on diet and exercise extensively Recommended to continue current medication Recommended restarting Zetia as this should be a lower cost medication  CAD (Goal: prevent heart events) -Controlled -Current treatment  Aspirin 81 mg 1 tablet daily Atorvastatin 80 mg 1 tablet daily Clopidogrel 75 mg 1 tablet daily with breakfast Nitroglycerin 0.4 mg SL as needed Metoprolol succinate 50 mg 1 tablet  daily -Medications previously tried: none  -Current BP readings: 133/92, 137/102 -Recommended to continue current medication Counseled on verifying expiration date of nitroglycerin and having a non expired on hand supply. Recommended checking blood pressure weekly with use of metoprolol and bringing cuff to next in office appointment to verify accuracy.  Tobacco use (Goal quit smoking) -Uncontrolled -Previous quit attempts: cold [place] -Current treatment  No medications -Patient smokes Within 30 minutes of waking -Patient triggers include: stress and pain -On a scale of 1-10, reports MOTIVATION to quit is 7 -On a scale of 1-10, reports CONFIDENCE in quitting is 7 -Provided contact information for [place] Quit Line (1-800-QUIT-NOW) and encouraged patient to reach out to this group for support. -Recommended thinking about harms of smoking. Plan to control pain in order to lessen cigarette use.  GERD/Barrett's esophagus (Goal: minimize further damage to esophagus) -Controlled -Current treatment  Pantoprazole 40 mg 1 tablet  daily -Medications previously tried: none  -Counseled on non-pharmacologic management of symptoms such as elevating the head of your bed, avoiding eating 2-3 hours before bed, avoiding triggering foods such as acidic, spicy, or fatty foods, eating smaller meals, and wearing clothes that are loose around the waist  Educated on smoking cessation will prevent further damage to esophagus.  Health Maintenance -Vaccine gaps: shingrix, COVID booster, influenza -Current therapy:  Ondansetron 8 mg as needed Vitamin B12 1000 mcg 1 tablet daily -Educated on Cost vs benefit of each product must be carefully weighed by individual consumer -Patient is satisfied with current therapy and denies issues -Recommended to continue current medication  Patient Goals/Self-Care Activities Patient will:  - focus on medication adherence by moving all medications to be administered at the same time. check blood pressure weekly, document, and provide at future appointments target a minimum of 150 minutes of moderate intensity exercise weekly  Follow Up Plan: The care management team will reach out to the patient again over the next 30 days.        Medication Assistance: None required.  Patient affirms current coverage meets needs.  Compliance/Adherence/Medication fill history: Care Gaps: Shingrix, COVID booster, influenza  Star-Rating Drugs: Atorvastatin 79m - [facility]  [facility]  [id number] [place]: [phone]: [phone] [place] [address] [place]: [phone]: [phone] pill box? Yes Pt endorses 85% compliance - misses atorvastatin and aspirin at bedtime  We discussed: Benefits of medication  synchronization, packaging and delivery as well as enhanced pharmacist oversight with [facility]. Patient decided to:  Continue current medication management strategy  Care Plan and Follow Up Patient Decision:  Patient agrees to Care Plan and Follow-up.  Plan: The care management team will reach out to the patient again over the next 30 days.  Jeni Salles, PharmD, [place] Pharmacist [place] [facility]

## 2021-03-11 ENCOUNTER — Telehealth: Payer: Self-pay | Admitting: Cardiovascular Disease

## 2021-03-11 ENCOUNTER — Telehealth: Payer: Self-pay | Admitting: Pharmacist

## 2021-03-11 ENCOUNTER — Other Ambulatory Visit: Payer: Self-pay | Admitting: *Deleted

## 2021-03-11 DIAGNOSIS — E785 Hyperlipidemia, unspecified: Secondary | ICD-10-CM

## 2021-03-11 MED ORDER — CLOPIDOGREL BISULFATE 75 MG PO TABS: ORAL_TABLET | ORAL | 5 refills | Status: DC

## 2021-03-11 MED ORDER — METOPROLOL SUCCINATE ER 50 MG PO TB24: 50.0000 mg | ORAL_TABLET | Freq: Every day | ORAL | 5 refills | Status: DC

## 2021-03-11 MED ORDER — PANTOPRAZOLE SODIUM 40 MG PO TBEC: 40.0000 mg | DELAYED_RELEASE_TABLET | Freq: Every day | ORAL | 5 refills | Status: DC

## 2021-03-11 MED ORDER — ATORVASTATIN CALCIUM 80 MG PO TABS: 80.0000 mg | ORAL_TABLET | Freq: Every day | ORAL | 5 refills | Status: DC

## 2021-03-11 NOTE — Telephone Encounter (Signed)
Rx's have been sent in as requested. Confirmation received.

## 2021-03-11 NOTE — Telephone Encounter (Signed)
*  STAT* If patient is at the pharmacy, call can be transferred to refill team.   1. Which medications need to be refilled? (please list name of each medication and dose if known)  clopidogrel (PLAVIX) 75 MG tablet atorvastatin (LIPITOR) 80 MG tablet pantoprazole (PROTONIX) 40 MG tablet  2. Which pharmacy/location (including street and city if local pharmacy) is medication to be sent to? [facility] - [place], [place] - [place] [address]  3. Do they need a 30 day or 90 day supply? 30 day

## 2021-03-11 NOTE — Telephone Encounter (Signed)
   Pharmacy called back and would like to include   metoprolol succinate (TOPROL-XL) 50 MG 24 hr tablet (Expired)  For 30 days refill

## 2021-03-11 NOTE — Progress Notes (Addendum)
Chronic Care Management Pharmacy Assistant   Name: Derrick Stokes  MRN: [id number] DOB: 31-Jan-1951   Chronic Care Management    Outreach Note   03-11-2021  Name: Derrick Stokes   MRN: [id number]       DOB:DOB@    Referred by: [name], [name], MD  Reason for referral: Chronic care management  Reviewed chart for medication changes ahead of medication coordination call.  BP Readings from Last 3 Encounters:  10/23/20 128/82  10/16/20 102/65  10/07/20 110/80    Lab Results  Component Value Date   HGBA1C 5.3 09/14/2018     Verbal consent obtained for [facility] enhanced pharmacy services (medication synchronization, adherence packaging, delivery coordination). A medication sync plan was created to allow patient to get all medications delivered once every 30 to 90 days per patient preference. Patient understands they have freedom to choose pharmacy and clinical pharmacist will coordinate care between all prescribers and [facility].  Patient requested to obtain medications through Adherence Packaging  30 Days   Medication plan: Medication Name CPP Transfer (put Dr.'s name) Timing Last Fill Date & Day Supply Format: MM/DD/YY - DS (If last fill/DS unavailable, list pt.'s quantity on hand) Anticipated next due date  Format: MM/DD/YY      BB  B  L  EM  BT    Ezetimibe 10 mg tablet x   1    New medication Send as soon as possible  Metoprolol Succinate 50 mg  Leanor Kail, PA  1    #148 Tabs on hand as of 02-28-21 Sat 07/20/21  Clopidogrel 75 mg  Sherren Mocha, MD  1    #84 Tabs on hand as of  02-28-21 Fri 05/23/21  Atorvastatin 80 mg  Sherren Mocha, MD  1    #58 Tabs on hand as of 02-28-2021 Nancy Fetter 04/27/21  Pantoprazole 40 mg  Sherren Mocha, MD  1    #202 Tabs on hand as of 02-28-2021 Thurs 09/18/21    Prescriptions requested from PCP and specialists. Called to Dr Starla Link office spoke to [facility] to request prescriptions be sent to [facility] for 30 DS  for the following: Clopidogrel 75 mg Atorvastatin 80 mg  Pantoprazole 40  mg [place] Succinate 50 mg   Medications: Outpatient Encounter Medications as of 03/11/2021  Medication Sig   aspirin EC 81 MG tablet Take 81 mg by mouth at bedtime.    atorvastatin (LIPITOR) 80 MG tablet TAKE 1 TABLET (80 MG TOTAL) BY MOUTH DAILY AT 6 PM.   clopidogrel (PLAVIX) 75 MG tablet TAKE 1 TABLET BY MOUTH EVERY DAY WITH BREAKFAST   ezetimibe (ZETIA) 10 MG tablet Take 1 tablet (10 mg total) by mouth daily.   metoprolol succinate (TOPROL-XL) 50 MG 24 hr tablet Take 1 tablet (50 mg total) by mouth daily. Take with or immediately following a meal.   nitroGLYCERIN (NITROSTAT) 0.4 MG SL tablet PLACE 1 TABLET UNDER THE TONGUE EVERY 5 MINUTES X 3 DOSES AS NEEDED FOR CHEST PAIN. (Patient taking differently: Place 0.4 mg under the tongue every 5 (five) minutes as needed for chest pain.)   Omega-3 Fatty Acids (FISH OIL) 1000 MG CPDR Take 1,000 mg by mouth daily.    ondansetron (ZOFRAN ODT) 8 MG disintegrating tablet Take 1 tablet (8 mg total) by mouth every 8 (eight) hours as needed for nausea or vomiting.   pantoprazole (PROTONIX) 40 MG tablet TAKE 1 TABLET BY MOUTH EVERY DAY   vitamin B-12 (CYANOCOBALAMIN) 1000 MCG tablet  Take 1,000 mcg by mouth daily.    No facility-administered encounter medications on file as of 03/11/2021.    [place] Clinical Pharmacist Assistant [phone]

## 2021-03-13 NOTE — Patient Instructions (Addendum)
Hi Derrick Stokes and Derrick Stokes,  It was great to get to meet you both in person! Below is a summary of some of the topics we discussed. I have also attached some information about the dangers of smoking as we start to navigate ways to cut back and eventually quit cigarettes.  Please reach out to me if you have any questions or need anything before our follow up!  Best, Maddie  Jeni Salles, PharmD, [place] [facility]   Visit Information   Goals Addressed             This Visit's Progress    Manage My Medicine       Timeframe:  Long-Range Goal Priority:  Medium Start Date:                             Expected End Date:                       Follow Up Date 06/13/21    - use a pillbox to sort medicine - use an alarm clock or phone to remind me to take my medicine    Why is this important?   These steps will help you keep on track with your medicines.   Notes:        Patient Care Plan: CCM Pharmacy Care Plan     Problem Identified: Problem: Hyperlipidemia, Coronary Artery Disease, GERD, and Tobacco use      Long-Range Goal: Patient-Specific Goal   Start Date: 02/28/2021  Expected End Date: 02/28/2022  This Visit's Progress: On track  Priority: High  Note:   Current Barriers:  Unable to independently afford treatment regimen Unable to independently monitor therapeutic efficacy Unable to achieve control of cholesterol  Unable to self administer medications as prescribed  Pharmacist Clinical Goal(s):  Patient will verbalize ability to afford treatment regimen achieve adherence to monitoring guidelines and medication adherence to achieve therapeutic efficacy achieve control of cholesterol as evidenced by next lipid panel  through collaboration with PharmD and provider.   Interventions: 1:1 collaboration with [name], [name], MD regarding development and update of comprehensive plan of care as evidenced by provider  attestation and co-signature Inter-disciplinary care team collaboration (see longitudinal plan of care) Comprehensive medication review performed; medication list updated in electronic medical record  Hyperlipidemia: (LDL goal < 70) -Uncontrolled -Current treatment: Atorvastatin 80 mg 1 tablet daily Fish oil 1200 mg  1 capsule daily -Medications previously tried: Zetia (did not start due to cost)  -Current dietary patterns: eating only breakfast most days -Current exercise habits: active during the day; no formal exercise -Educated on Cholesterol goals;  Benefits of statin for ASCVD risk reduction; Importance of limiting foods high in cholesterol; Exercise goal of 150 minutes per week; -Counseled on diet and exercise extensively Recommended to continue current medication Recommended restarting Zetia as this should be a lower cost medication  CAD (Goal: prevent heart events) -Controlled -Current treatment  Aspirin 81 mg 1 tablet daily Atorvastatin 80 mg 1 tablet daily Clopidogrel 75 mg 1 tablet daily with breakfast Nitroglycerin 0.4 mg SL as needed Metoprolol succinate 50 mg 1 tablet  daily -Medications previously tried: none  -Current BP readings: 133/92, 137/102 -Recommended to continue current medication Counseled on verifying expiration date of nitroglycerin and having a non expired on hand supply. Recommended checking blood pressure weekly with use of metoprolol and bringing cuff to next in  office appointment to verify accuracy.  Tobacco use (Goal quit smoking) -Uncontrolled -Previous quit attempts: cold [place] -Current treatment  No medications -Patient smokes Within 30 minutes of waking -Patient triggers include: stress and pain -On a scale of 1-10, reports MOTIVATION to quit is 7 -On a scale of 1-10, reports CONFIDENCE in quitting is 7 -Provided contact information for [place] [facility]uit [facility] (1-800-QUIT-NOW) and encouraged patient to reach out to this group for  support. -Recommended thinking about harms of smoking. Plan to control pain in order to lessen cigarette use.  GERD/Barrett's esophagus (Goal: minimize further damage to esophagus) -Controlled -Current treatment  Pantoprazole 40 mg 1 tablet daily -Medications previously tried: none  -Counseled on non-pharmacologic management of symptoms such as elevating the head of your bed, avoiding eating 2-3 hours before bed, avoiding triggering foods such as acidic, spicy, or fatty foods, eating smaller meals, and wearing clothes that are loose around the waist  Educated on smoking cessation will prevent further damage to esophagus.  Health Maintenance -Vaccine gaps: shingrix, COVID booster, influenza -Current therapy:  Ondansetron 8 mg as needed Vitamin B12 1000 mcg 1 tablet daily -Educated on Cost vs benefit of each product must be carefully weighed by individual consumer -Patient is satisfied with current therapy and denies issues -Recommended to continue current medication  Patient Goals/Self-Care Activities Patient will:  - focus on medication adherence by moving all medications to be administered at the same time. check blood pressure weekly, document, and provide at future appointments target a minimum of 150 minutes of moderate intensity exercise weekly  Follow Up Plan: The care management team will reach out to the patient again over the next 30 days.       Derrick Stokes was given information about Chronic Care Management services today including:  CCM service includes personalized support from designated clinical staff supervised by his physician, including individualized plan of care and coordination with other care providers 24/7 contact phone numbers for assistance for urgent and routine care needs. Standard insurance, coinsurance, copays and deductibles apply for chronic care management only during months in which we provide at least 20 minutes of these services. Most insurances  cover these services at 100%, however patients may be responsible for any copay, coinsurance and/or deductible if applicable. This service may help you avoid the need for more expensive face-to-face services. Only one practitioner may furnish and bill the service in a calendar month. The patient may stop CCM services at any time (effective at the end of the month) by phone call to the office staff.  Patient agreed to services and verbal consent obtained.   Patient verbalizes understanding of instructions provided today and agrees to view in [place].  The pharmacy team will reach out to the patient again over the next 30 days.   Viona Gilmore, [facility]

## 2021-03-28 NOTE — Progress Notes (Signed)
Chief Complaint  Patient presents with   Follow-up    Neuropathy pain   HPI: Derrick Stokes is a 70 y.o. male with hx of CABG, hyperlipidemia, and tobacco abuse here today with his wife c/o worsening neuropathy.  Derrick Stokes was last seen on 09/20/20. Derrick Stokes has followed with his cardiologist and ENT since his last visit.  LE's and feet constant pain, "hurt all the time." LE pain is worse with ambulation and rainy weather. Burning feet sensation. Problem has been going on for 4-5 years and getting worse.  Lower back pain, which is also getting worse, radiated to LE's. Right thigh numbness "for long time." Negative for saddle anesthesia or bladder/bowel dysfunction.  Lumbar MRI in 09/2004: DDD with disc desiccation L3-4-L5-S1, minimal bulged disc L3-4,L4-5, and question of mild epidural lipomatosis vs fat deposits with probably no clinical significance.  According to his wife, Derrick Stokes has seen neurologist and neurosurgeon several times. Surgery was recommended at some point but not pursue because MRI was not covered by his health insurance.  + Smoker. Normal ABI in 07/2018.  B12 deficiency: Derrick Stokes is on B12 supplementation daily.  Lab Results  Component Value Date   [id number] 09/20/2020   Lab Results  Component Value Date   TSH 3.180 09/16/2014   Lab Results  Component Value Date   HGBA1C 5.3 09/14/2018   Lab Results  Component Value Date   CREATININE 0.81 10/07/2020   BUN 12 10/07/2020   NA 136 10/07/2020   K 4.9 10/07/2020   CL 98 10/07/2020   CO2 21 10/07/2020   Lab Results  Component Value Date   WBC 7.0 10/07/2020   HGB 16.5 10/07/2020   HCT 47.8 10/07/2020   MCV 91 10/07/2020   PLT 261 10/07/2020   Review of Systems  Constitutional:  Negative for activity change, appetite change, fatigue and fever.  HENT:  Negative for nosebleeds and sore throat.   Eyes:  Negative for redness and visual disturbance.  Respiratory:  Negative for apnea, cough, shortness of  breath and wheezing.   Cardiovascular:  Negative for chest pain, palpitations and leg swelling.  Gastrointestinal:  Negative for abdominal pain, nausea and vomiting.  Genitourinary:  Negative for decreased urine volume, dysuria and hematuria.  Musculoskeletal:  Positive for arthralgias and back pain.  Neurological:  Negative for dizziness, syncope and headaches.  Rest of ROS, see pertinent positives sand negatives in HPI  Current Outpatient Medications on File Prior to Visit  Medication Sig Dispense Refill   aspirin EC 81 MG tablet Take 81 mg by mouth at bedtime.      atorvastatin (LIPITOR) 80 MG tablet Take 1 tablet (80 mg total) by mouth daily at 6 PM. 30 tablet 5   clopidogrel (PLAVIX) 75 MG tablet TAKE 1 TABLET BY MOUTH EVERY DAY WITH BREAKFAST 30 tablet 5   ezetimibe (ZETIA) 10 MG tablet Take 1 tablet (10 mg total) by mouth daily. 30 tablet 3   metoprolol succinate (TOPROL-XL) 50 MG 24 hr tablet Take 1 tablet (50 mg total) by mouth daily. Take with or immediately following a meal. 30 tablet 5   nitroGLYCERIN (NITROSTAT) 0.4 MG SL tablet PLACE 1 TABLET UNDER THE TONGUE EVERY 5 MINUTES X 3 DOSES AS NEEDED FOR CHEST PAIN. (Patient taking differently: Place 0.4 mg under the tongue every 5 (five) minutes as needed for chest pain.) 25 tablet 3   Omega-3 Fatty Acids (FISH OIL) 1000 MG CPDR Take 1,000 mg by mouth daily.  ondansetron (ZOFRAN ODT) 8 MG disintegrating tablet Take 1 tablet (8 mg total) by mouth every 8 (eight) hours as needed for nausea or vomiting. 30 tablet 0   pantoprazole (PROTONIX) 40 MG tablet Take 1 tablet (40 mg total) by mouth daily. 30 tablet 5   vitamin B-12 (CYANOCOBALAMIN) 1000 MCG tablet Take 1,000 mcg by mouth daily.      No current facility-administered medications on file prior to visit.   Past Medical History:  Diagnosis Date   Arthritis    B12 deficiency    Borderline   CAD (coronary artery disease)    a. s/p CABG 01/2011;  b. ETT Myoview 3/14:  Low risk;   c. 09/2014 Cath/PCI: LM nl, LAD 100p, LCX 20mOM2 60-70p (2.75x28 Synergy DES), RCA dom, 877matherectomy, 3.5x24 Synergy DES), PDA nl, LIMA->LAD nl, VG->Diag nl, VG->OM 100, VG->RCA 100.   Cataract    bilateral sx    COPD (chronic obstructive pulmonary disease) (HCC)    GERD (gastroesophageal reflux disease)    on meds   Hiatal hernia    History of echocardiogram    Echo 10/16: EF 50-55%, no RWMA, Gr 1 DD, mild MR, mild TR   History of kidney stones    Passed   HLD (hyperlipidemia)    on meds   HOH (hard of hearing)    Hypertension    on meds   Leg pain    ABIs 4/14:  R 1.2, L 1.2, TBIs normal   Peripheral neuropathy    From trauma   Reactive airway disease 03/18/2018   Tobacco abuse    Allergies  Allergen Reactions   Iodine Nausea And Vomiting    Turns white in face and sweaty   Iohexol Other (See Comments)    May have caused nausea and vomiting several years ago    Shrimp [Shellfish Allergy] Nausea And Vomiting    Turns white in face and sweaty   Isosorbide Dinitrate Other (See Comments)    headache    Social History   Socioeconomic History   Marital status: Married    Spouse name: Not on file   Number of children: Not on file   Years of education: Not on file   Highest education level: Not on file  Occupational History   Occupation: [employer]  Tobacco Use   Smoking status: Every Day    Packs/day: 1.00    Years: 51.00    Pack years: 51.00    Types: Cigarettes    Start date: 1970   Smokeless tobacco: Never  Vaping Use   Vaping Use: Never used  Substance and Sexual Activity   Alcohol use: Yes    Alcohol/week: 3.0 standard drinks    Types: 3 Cans of beer per week   Drug use: No   Sexual activity: Yes  Other Topics Concern   Not on file  Social History Narrative   Married   Gets regular exercise - walks 1/4 mile per day 5 times a week without symptoms   Social Determinants of [employer]: Low Risk    Difficulty of Paying  Living Expenses: Not very hard  Food Insecurity: Not on file  Transportation Needs: No Transportation Needs   Lack of Transportation (Medical): No   Lack of Transportation (Non-Medical): No  Physical Activity: Not on file  Stress: Not on file  Social Connections: Not on file   Vitals:   03/31/21 0855  BP: 118/74  Pulse: 63  Resp: 16  SpO2:  98%   Body mass index is 27.73 kg/m.  Physical Exam Vitals and nursing note reviewed.  Constitutional:      General: Derrick Stokes is not in acute distress.    Appearance: Derrick Stokes is well-developed.  HENT:     Head: Normocephalic and atraumatic.  Eyes:     Conjunctiva/sclera: Conjunctivae normal.  Cardiovascular:     Rate and Rhythm: Normal rate and regular rhythm.     Pulses:          Dorsalis pedis pulses are 2+ on the right side and 2+ on the left side.     Heart sounds: No murmur heard. Pulmonary:     Effort: Pulmonary effort is normal. No respiratory distress.     Breath sounds: Normal breath sounds.  Abdominal:     Palpations: Abdomen is soft. There is no hepatomegaly or mass.     Tenderness: There is no abdominal tenderness.  Musculoskeletal:     Lumbar back: No tenderness. Negative right straight leg raise test and negative left straight leg raise test.  Lymphadenopathy:     Cervical: No cervical adenopathy.  Skin:    General: Skin is warm.     Findings: No erythema or rash.  Neurological:     General: No focal deficit present.     Mental Status: Derrick Stokes is alert and oriented to person, place, and time.     Cranial Nerves: No cranial nerve deficit.     Deep Tendon Reflexes:     Reflex Scores:      Patellar reflexes are 2+ on the right side and 2+ on the left side.    Comments: Vibration absent left foot. Decreased monofilament. Antalgic gait, not assisted.  Psychiatric:     Comments: Good eye contact.   ASSESSMENT AND PLAN:  Derrick Stokes was seen today for follow-up.  Orders Placed This Encounter  Procedures   MR Lumbar  Spine Wo Contrast   Flu Vaccine QUAD High Dose(Fluad)   Hemoglobin A1c   TSH   Vitamin B12   Comprehensive metabolic panel   Microalbumin / creatinine urine ratio   Lab Results  Component Value Date   VITAMINB12 820 03/31/2021   Lab Results  Component Value Date   TSH 1.75 03/31/2021   Lab Results  Component Value Date   HGBA1C 5.6 03/31/2021   Lab Results  Component Value Date   MICROALBUR <0.7 03/31/2021   Lab Results  Component Value Date   CREATININE 0.80 03/31/2021   BUN 9 03/31/2021   NA 137 03/31/2021   K 4.8 03/31/2021   CL 99 03/31/2021   CO2 28 03/31/2021   Lab Results  Component Value Date   ALT 24 03/31/2021   AST 17 03/31/2021   ALKPHOS 66 03/31/2021   BILITOT 1.0 03/31/2021   B12 deficiency Continue current dose of B12 supplementation. Further recommendations will be given according to B12 result.  Peripheral neuropathic pain We discussed possible etiologies.  Differential Dx's discussed. Peripheral pulses present. Appropriate foot care. Duloxetine 30 mg started today, some side effects discussed. Gabapentin can be added in a few weeks if problem does not greatly improved. Further recommendations according to lab results.  Chronic bilateral low back pain with sciatica Problem is chronic and getting worse. Lumbar MRI order placed. We discussed treatment options, agrees with trying Duloxetine 30 mg daily.  Fall precautions. F/U in 2 months, before if needed.   Tobacco abuse We discussed adverse effects of tobacco use. Smoking cessation encouraged.  Need  for influenza vaccination - Flu Vaccine QUAD High Dose(Fluad)  Claudication (HCC) Peripheral pulses present and normal ABI in 07/2018. ? Neurogenic claudication. We can consider adding Gabapentin next visit.  I spent a total of 41 minutes in both face to face and non face to face activities for this visit on the date of this encounter. During this time history was obtained and  documented, examination was performed, prior labs/imaging reviewed, and assessment/plan discussed.  Return in about 2 months (around 05/31/2021).   Everline Mahaffy G. [name], MD  [facility]. [place] office.

## 2021-03-31 ENCOUNTER — Ambulatory Visit (INDEPENDENT_AMBULATORY_CARE_PROVIDER_SITE_OTHER): Payer: Medicare HMO | Admitting: Family Medicine

## 2021-03-31 ENCOUNTER — Other Ambulatory Visit: Payer: Self-pay

## 2021-03-31 ENCOUNTER — Encounter: Payer: Self-pay | Admitting: Family Medicine

## 2021-03-31 VITALS — BP 118/74 | HR 63 | Resp 16 | Ht 68.0 in | Wt 182.4 lb

## 2021-03-31 DIAGNOSIS — Z23 Encounter for immunization: Secondary | ICD-10-CM

## 2021-03-31 DIAGNOSIS — G8929 Other chronic pain: Secondary | ICD-10-CM

## 2021-03-31 DIAGNOSIS — M792 Neuralgia and neuritis, unspecified: Secondary | ICD-10-CM

## 2021-03-31 DIAGNOSIS — I739 Peripheral vascular disease, unspecified: Secondary | ICD-10-CM | POA: Diagnosis not present

## 2021-03-31 DIAGNOSIS — M5440 Lumbago with sciatica, unspecified side: Secondary | ICD-10-CM | POA: Diagnosis not present

## 2021-03-31 DIAGNOSIS — Z720 Tobacco use: Secondary | ICD-10-CM

## 2021-03-31 DIAGNOSIS — E538 Deficiency of other specified B group vitamins: Secondary | ICD-10-CM

## 2021-03-31 LAB — COMPREHENSIVE METABOLIC PANEL WITH GFR
ALT: 24 U/L (ref 0–53)
AST: 17 U/L (ref 0–37)
Albumin: 4.2 g/dL (ref 3.5–5.2)
Alkaline Phosphatase: 66 U/L (ref 39–117)
BUN: 9 mg/dL (ref 6–23)
CO2: 28 meq/L (ref 19–32)
Calcium: 9.8 mg/dL (ref 8.4–10.5)
Chloride: 99 meq/L (ref 96–112)
Creatinine, Ser: 0.8 mg/dL (ref 0.40–1.50)
GFR: 90.92 mL/min
Glucose, Bld: 85 mg/dL (ref 70–99)
Potassium: 4.8 meq/L (ref 3.5–5.1)
Sodium: 137 meq/L (ref 135–145)
Total Bilirubin: 1 mg/dL (ref 0.2–1.2)
Total Protein: 6.9 g/dL (ref 6.0–8.3)

## 2021-03-31 LAB — TSH: TSH: 1.75 u[IU]/mL (ref 0.35–5.50)

## 2021-03-31 LAB — MICROALBUMIN / CREATININE URINE RATIO
Creatinine,U: 61.1 mg/dL
Microalb Creat Ratio: 1.1 mg/g (ref 0.0–30.0)
Microalb, Ur: <0.7 mg/dL (ref 0.0–1.9)

## 2021-03-31 LAB — VITAMIN B12: Vitamin B-12: 820 pg/mL (ref 211–911)

## 2021-03-31 LAB — HEMOGLOBIN A1C: Hgb A1c MFr Bld: 5.6 % (ref 4.6–6.5)

## 2021-03-31 MED ORDER — DULOXETINE HCL 30 MG PO CPEP: 30.0000 mg | ORAL_CAPSULE | Freq: Every day | ORAL | 1 refills | Status: DC

## 2021-03-31 NOTE — Assessment & Plan Note (Addendum)
Problem is chronic and getting worse. Lumbar MRI order placed. We discussed treatment options, agrees with trying Duloxetine 30 mg daily.  Fall precautions. F/U in 2 months, before if needed.

## 2021-03-31 NOTE — Assessment & Plan Note (Addendum)
We discussed possible etiologies.  Differential Dx's discussed. Peripheral pulses present. Appropriate foot care. Duloxetine 30 mg started today, some side effects discussed. Gabapentin can be added in a few weeks if problem does not greatly improved. Further recommendations according to lab results.

## 2021-03-31 NOTE — Assessment & Plan Note (Signed)
We discussed adverse effects of tobacco use. Smoking cessation encouraged.

## 2021-03-31 NOTE — Assessment & Plan Note (Signed)
Continue current dose of B12 supplementation. Further recommendations will be given according to B12 result.

## 2021-03-31 NOTE — Patient Instructions (Addendum)
A few things to remember from today's visit:  B12 deficiency - Plan: Vitamin B12  Peripheral neuropathic pain - Plan: MR Lumbar Spine Wo Contrast, Hemoglobin A1c, TSH, Comprehensive metabolic panel, Microalbumin / creatinine urine ratio, DULoxetine (CYMBALTA) 30 MG capsule  Chronic bilateral low back pain with sciatica, sciatica laterality unspecified - Plan: MR Lumbar Spine Wo Contrast, DULoxetine (CYMBALTA) 30 MG capsule  Today Duloxetine 30 mg added. In 3-4 weeks let me know how are you doing with new med, then we could add Gabapentin. If you need refills please call your pharmacy. Do not use My Chart to request refills or for acute issues that need immediate attention.    Please be sure medication list is accurate. If a new problem present, please set up appointment sooner than planned today.

## 2021-04-01 ENCOUNTER — Telehealth: Payer: Self-pay | Admitting: Pharmacist

## 2021-04-01 NOTE — Chronic Care Management (AMB) (Signed)
Chronic Care Management Pharmacy Assistant   Name: CAIUS CONNELLEY  MRN: [id number] DOB: [birth year]  Reason for Encounter: General Assessment Call   Conditions to be addressed/monitored: HLD & Tolerance per note on 02-28-21  Recent office visits:  03-31-2021 [name], [name], MD - Patient presented for Chronic bilateral low back pain with sciatica, sciatica laterality unspecified. Prescribed Duloxetine 30 mg daily.  Recent consult visits:  None   Hospital visits:  Medication Reconciliation was completed by comparing discharge summary, patient's EMR and Pharmacy list, and upon discussion with patient.   Patient presented to [facility] on 10-16-2020 due to cath procedure. He was there for 5 hours   New?Medications Started at [facility] Discharge:?? -started None   Medication Changes at Hospital Discharge: -Changed None   Medications Discontinued at Hospital Discharge: -Stopped None   Medications that remain the same after Hospital Discharge:??  -All other medications will remain the same.      Medications: Outpatient Encounter Medications as of 04/01/2021  Medication Sig   aspirin EC 81 MG tablet Take 81 mg by mouth at bedtime.    atorvastatin (LIPITOR) 80 MG tablet Take 1 tablet (80 mg total) by mouth daily at 6 PM.   clopidogrel (PLAVIX) 75 MG tablet TAKE 1 TABLET BY MOUTH EVERY DAY WITH BREAKFAST   DULoxetine (CYMBALTA) 30 MG capsule Take 1 capsule (30 mg total) by mouth daily.   ezetimibe (ZETIA) 10 MG tablet Take 1 tablet (10 mg total) by mouth daily.   metoprolol succinate (TOPROL-XL) 50 MG 24 hr tablet Take 1 tablet (50 mg total) by mouth daily. Take with or immediately following a meal.   nitroGLYCERIN (NITROSTAT) 0.4 MG SL tablet PLACE 1 TABLET UNDER THE TONGUE EVERY 5 MINUTES X 3 DOSES AS NEEDED FOR CHEST PAIN. (Patient taking differently: Place 0.4 mg under the tongue every 5 (five) minutes as needed for chest pain.)   Omega-3 Fatty Acids (FISH OIL)  1000 MG CPDR Take 1,000 mg by mouth daily.    ondansetron (ZOFRAN ODT) 8 MG disintegrating tablet Take 1 tablet (8 mg total) by mouth every 8 (eight) hours as needed for nausea or vomiting.   pantoprazole (PROTONIX) 40 MG tablet Take 1 tablet (40 mg total) by mouth daily.   vitamin B-12 (CYANOCOBALAMIN) 1000 MCG tablet Take 1,000 mcg by mouth daily.    No facility-administered encounter medications on file as of 04/01/2021.  04/01/2021 Name: ZACHARIUS TAM MRN: [id number] DOB: [birth year] Kathlene November Rosal is a 70 y.o. year old male who is a primary care patient of [name], Malka So, MD.  Comprehensive medication review performed; Spoke to patient regarding cholesterol  Lipid Panel    Component Value Date/Time   CHOL 139 09/20/2020 0858   CHOL 133 03/08/2019 0905   TRIG 104.0 09/20/2020 0858   HDL 43.20 09/20/2020 0858   HDL 36 (L) 03/08/2019 0905   LDLCALC 75 09/20/2020 0858   LDLCALC 101 (H) 02/05/2020 1417   LDLDIRECT 68.0 11/19/2014 0737    10-year ASCVD risk score: The 10-year ASCVD risk score (Arnett DK, et al., 2019) is: 31.5%   Values used to calculate the score:     Age: 70 years     Sex: Male     Is Non-Hispanic African American: No     Diabetic: Yes     Tobacco smoker: Yes     Systolic Blood Pressure: 123456 mmHg     Is BP treated: Yes     HDL Cholesterol:  43.2 mg/dL     Total Cholesterol: 139 mg/dL  Current antihyperlipidemic regimen:  Atorvastatin 80 mg 1 tablet daily Fish oil 1200 mg  1 capsule daily Previous antihyperlipidemic medications tried: Zetia ASCVD risk enhancing conditions: age >66 What recent interventions/DTPs have been made by any provider to improve Cholesterol control since last CPP Visit: Wife reports he is now taking all medications in the AM for compliance and that has been working great Any recent hospitalizations or ED visits since last visit with CPP? None What diet changes have been made to improve Cholesterol?  Wife reports his eating habits have  been about the same pretty good.  Adherence Review: Does the patient have >5 day gap between last estimated fill dates? No   Care Gaps: Zoster Vaccine - Overdue COVID Booster #3 [name]) - Overdue CCM - 07-16-21 AWV - Done 09-20-2020  Star Rating Drugs: Atorvastatin '80mg'$  - Last filled 03-19-2021 90 DS at [facility]   Notes: Call to patient spoke to his wife for the call, she reports he is now compliant with taking his medications, reports taking them all in the morning is a lot easier for them. She reports he had been prescribed recently Cymbalta by Dr [name] notes he took first dose on today on an empty stomach and experienced some stomach upset. She also asked to have his primary pharmacy changed in chart to [facility]. Pharmacy updated and Pharmacist notified of stomach upset, she advised that he take the medication for a week with food and advise If he was still having any issues. Wife advised of the above and in agreement.  [place] Clinical Pharmacist Assistant [phone]

## 2021-04-14 ENCOUNTER — Encounter: Payer: Self-pay | Admitting: Family Medicine

## 2021-04-15 NOTE — Progress Notes (Signed)
04-15-21 Call to patient spoke to wife to see if he is still experiencing any stomach upset with the Cymbalta she reports all has been well since eating with the medication.  [place] Clinical Pharmacist Assistant [phone]

## 2021-05-08 ENCOUNTER — Telehealth: Payer: Self-pay | Admitting: Pharmacist

## 2021-05-08 NOTE — Chronic Care Management (AMB) (Signed)
    Chronic Care Management Pharmacy Assistant   Name: Derrick Stokes  MRN: [id number] DOB: 04/16/1951   Reason for Encounter: Medication Review Medication Coordination   Recent office visits:  03/31/21 [name], [name], MD - Patient presented for chronic bilateral low back pain with sciatica and other concerns. Prescribed Duloxetine HCL  Recent consult visits:  None   Hospital visits:  None in previous 6 months  Medications: Outpatient Encounter Medications as of 05/08/2021  Medication Sig   aspirin EC 81 MG tablet Take 81 mg by mouth at bedtime.    atorvastatin (LIPITOR) 80 MG tablet Take 1 tablet (80 mg total) by mouth daily at 6 PM.   clopidogrel (PLAVIX) 75 MG tablet TAKE 1 TABLET BY MOUTH EVERY DAY WITH BREAKFAST   DULoxetine (CYMBALTA) 30 MG capsule Take 1 capsule (30 mg total) by mouth daily.   ezetimibe (ZETIA) 10 MG tablet Take 1 tablet (10 mg total) by mouth daily.   metoprolol succinate (TOPROL-XL) 50 MG 24 hr tablet Take 1 tablet (50 mg total) by mouth daily. Take with or immediately following a meal.   nitroGLYCERIN (NITROSTAT) 0.4 MG SL tablet PLACE 1 TABLET UNDER THE TONGUE EVERY 5 MINUTES X 3 DOSES AS NEEDED FOR CHEST PAIN. (Patient taking differently: Place 0.4 mg under the tongue every 5 (five) minutes as needed for chest pain.)   Omega-3 Fatty Acids (FISH OIL) 1000 MG CPDR Take 1,000 mg by mouth daily.    ondansetron (ZOFRAN ODT) 8 MG disintegrating tablet Take 1 tablet (8 mg total) by mouth every 8 (eight) hours as needed for nausea or vomiting.   pantoprazole (PROTONIX) 40 MG tablet Take 1 tablet (40 mg total) by mouth daily.   vitamin B-12 (CYANOCOBALAMIN) 1000 MCG tablet Take 1,000 mcg by mouth daily.    No facility-administered encounter medications on file as of 05/08/2021.  Reviewed chart for medication changes ahead of medication coordination call.  No OVs, Consults, or hospital visits since last care coordination call/Pharmacist visit.  No medication  changes indicated . BP Readings from Last 3 Encounters:  03/31/21 118/74  10/23/20 128/82  10/16/20 102/65    Lab Results  Component Value Date   HGBA1C 5.6 03/31/2021     Patient obtains medications through Adherence Packaging  30 Days   Last adherence delivery included: NA this is first delivery of packs  Patient is due for 1st adherence delivery on: 05/21/21. Called patient and reviewed medications and coordinated delivery. Confirmed Packaging for 30 DS  Confirmed timing of medications for packaging with wife  This delivery to include: Ezetimibe (Zetia) 10 mg: take on at Breakfast Metoprolol Succ.(Toprolol)  50 mg : take one at Breakfast Clopidogrel (Plavix) 75 mg : take one at Breakfast Atorvastatin (Lipitor)  80 mg: take one at Breakfast Pantoprazole (Protonix) 40 mg: take one at Breakfast Duloxetine (Cymbalta) 30 mg: take one at Breakfast  Confirmed delivery date of 05/21/21, advised patient that pharmacy will contact them the morning of delivery.   Care Gaps: Urine Micro - Overdue Zoster Vaccine - Overdue COVID Booster #3 (Moderna) - Overdue Flu Vaccine - Overdue AWV done 3/22   Star Rating Drugs:  Atorvastatin 80mg  - Last filled 03/19/2021 90 DS at [facility] Pharmacist Assistant [phone]

## 2021-05-11 ENCOUNTER — Other Ambulatory Visit: Payer: Self-pay | Admitting: Family Medicine

## 2021-05-11 DIAGNOSIS — M792 Neuralgia and neuritis, unspecified: Secondary | ICD-10-CM

## 2021-05-11 DIAGNOSIS — M5441 Lumbago with sciatica, right side: Secondary | ICD-10-CM

## 2021-05-11 DIAGNOSIS — G8929 Other chronic pain: Secondary | ICD-10-CM

## 2021-05-11 DIAGNOSIS — M5440 Lumbago with sciatica, unspecified side: Secondary | ICD-10-CM

## 2021-06-06 ENCOUNTER — Other Ambulatory Visit: Payer: Self-pay | Admitting: Family Medicine

## 2021-06-06 DIAGNOSIS — M792 Neuralgia and neuritis, unspecified: Secondary | ICD-10-CM

## 2021-06-06 DIAGNOSIS — M5440 Lumbago with sciatica, unspecified side: Secondary | ICD-10-CM

## 2021-06-06 DIAGNOSIS — G8929 Other chronic pain: Secondary | ICD-10-CM

## 2021-06-06 NOTE — Progress Notes (Signed)
HPI: [name] is a 70 y.o. male, who is here today to follow on recent OV. He was last seen on 03/31/2021, when duloxetine 30 mg daily was started for chronic lower back pain and peripheral neuropathy. According to his wife, about 3 to 4 weeks ago his insurance company let them know that lumbar MRI was approved, appointment has not been arranged. Lower extremity burning sensation and lower back pain have greatly improved. Negative for saddle anesthesia or bowel/bladder dysfunction.  Hypertension:  Medications: Metoprolol succinate 50 mg daily. Noted mild bradycardia today. BP readings at home: He is not checking. Side effects: None. Last appointment with cardiologist in 10/2020.  Negative for unusual or severe headache, visual changes, exertional chest pain, dyspnea,  focal weakness, or edema.  Lab Results  Component Value Date   CREATININE 0.80 03/31/2021   BUN 9 03/31/2021   NA 137 03/31/2021   K 4.8 03/31/2021   CL 99 03/31/2021   CO2 28 03/31/2021   Reporting right rib cage pain after hearing a "pop" while working under his house. Initially he was having pain when sneezing and coughing, resolved. He has not noted local deformity,edema,or ecchymosis. Negative for wheezing and fever. He has not taking OTC medication.  Aortic atherosclerosis seen on imaging,chest CT in 01/2020:Aortic Atherosclerosis (ICD10-I70.0) and Emphysema (ICD10-J43.9). Currently he is on Atorvastatin 80 mg daily. + Smoker. CAD on Plavix 75 mg daily.  Review of Systems  Constitutional:  Negative for appetite change, chills and diaphoresis.  Gastrointestinal:  Negative for abdominal pain, nausea and vomiting.  Genitourinary:  Negative for decreased urine volume, dysuria and hematuria.  Musculoskeletal:  Positive for arthralgias and back pain. Negative for gait problem.  Skin:  Negative for pallor and rash.  Neurological:  Negative for syncope and facial asymmetry.  Rest see pertinent  positives and negatives per HPI.  Current Outpatient Medications on File Prior to Visit  Medication Sig Dispense Refill   aspirin EC 81 MG tablet Take 81 mg by mouth at bedtime.      atorvastatin (LIPITOR) 80 MG tablet Take 1 tablet (80 mg total) by mouth daily at 6 PM. 30 tablet 5   clopidogrel (PLAVIX) 75 MG tablet TAKE 1 TABLET BY MOUTH EVERY DAY WITH BREAKFAST 30 tablet 5   ezetimibe (ZETIA) 10 MG tablet TAKE ONE TABLET BY MOUTH ONCE DAILY 30 tablet 3   metoprolol succinate (TOPROL-XL) 50 MG 24 hr tablet Take 1 tablet (50 mg total) by mouth daily. Take with or immediately following a meal. 30 tablet 5   nitroGLYCERIN (NITROSTAT) 0.4 MG SL tablet PLACE 1 TABLET UNDER THE TONGUE EVERY 5 MINUTES X 3 DOSES AS NEEDED FOR CHEST PAIN. (Patient taking differently: Place 0.4 mg under the tongue every 5 (five) minutes as needed for chest pain.) 25 tablet 3   Omega-3 Fatty Acids (FISH OIL) 1000 MG CPDR Take 1,000 mg by mouth daily.      ondansetron (ZOFRAN ODT) 8 MG disintegrating tablet Take 1 tablet (8 mg total) by mouth every 8 (eight) hours as needed for nausea or vomiting. 30 tablet 0   pantoprazole (PROTONIX) 40 MG tablet Take 1 tablet (40 mg total) by mouth daily. 30 tablet 5   vitamin B-12 (CYANOCOBALAMIN) 1000 MCG tablet Take 1,000 mcg by mouth daily.      No current facility-administered medications on file prior to visit.    Past Medical History:  Diagnosis Date   Arthritis    B12 deficiency    Borderline  CAD (coronary artery disease)    a. s/p CABG 01/2011;  b. ETT Myoview 3/14:  Low risk;  c. 09/2014 Cath/PCI: LM nl, LAD 100p, LCX 52m/OM2 60-70p (2.75x28 Synergy DES), RCA dom, 42m (atherectomy, 3.5x24 Synergy DES), PDA nl, LIMA->LAD nl, VG->Diag nl, VG->OM 100, VG->RCA 100.   Cataract    bilateral sx    COPD (chronic obstructive pulmonary disease) (HCC)    GERD (gastroesophageal reflux disease)    on meds   Hiatal hernia    History of echocardiogram    Echo 10/16: EF 50-55%, no  RWMA, Gr 1 DD, mild MR, mild TR   History of kidney stones    Passed   HLD (hyperlipidemia)    on meds   HOH (hard of hearing)    Hypertension    on meds   Leg pain    ABIs 4/14:  R 1.2, L 1.2, TBIs normal   Peripheral neuropathy    From trauma   Reactive airway disease 03/18/2018   Tobacco abuse    Allergies  Allergen Reactions   Iodine Nausea And Vomiting    Turns white in face and sweaty   Iohexol Other (See Comments)    May have caused nausea and vomiting several years ago    Shrimp [Shellfish Allergy] Nausea And Vomiting    Turns white in face and sweaty   Isosorbide Dinitrate Other (See Comments)    headache    Social History   Socioeconomic History   Marital status: Married    Spouse name: Not on file   Number of children: Not on file   Years of education: Not on file   Highest education level: Not on file  Occupational History   Occupation: Diasability  Tobacco Use   Smoking status: Every Day    Packs/day: 1.00    Years: 51.00    Pack years: 51.00    Types: Cigarettes    Start date: 1970   Smokeless tobacco: Never  Vaping Use   Vaping Use: Never used  Substance and Sexual Activity   Alcohol use: Yes    Alcohol/week: 3.0 standard drinks    Types: 3 Cans of beer per week   Drug use: No   Sexual activity: Yes  Other Topics Concern   Not on file  Social History Narrative   Married   Gets regular exercise - walks 1/4 mile per day 5 times a week without symptoms   Social Determinants of Radio broadcast assistant Strain: Low Risk    Difficulty of Paying Living Expenses: Not very hard  Food Insecurity: Not on file  Transportation Needs: No Transportation Needs   Lack of Transportation (Medical): No   Lack of Transportation (Non-Medical): No  Physical Activity: Not on file  Stress: Not on file  Social Connections: Not on file   Vitals:   06/09/21 1121  BP: (!) 128/92  Pulse: (!) 52  Resp: 16  Temp: 97.6 F (36.4 C)  SpO2: 99%   Body mass  index is 27.67 kg/m.  Physical Exam Vitals and nursing note reviewed.  Constitutional:      General: He is not in acute distress.    Appearance: He is well-developed.  HENT:     Head: Normocephalic and atraumatic.     Mouth/Throat:     Mouth: Mucous membranes are moist.  Eyes:     Conjunctiva/sclera: Conjunctivae normal.  Cardiovascular:     Rate and Rhythm: Regular rhythm. Bradycardia present.     Heart  sounds: No murmur heard. Pulmonary:     Effort: Pulmonary effort is normal. No respiratory distress.     Breath sounds: Normal breath sounds.  Chest:     Chest wall: No tenderness, crepitus or edema.  Abdominal:     Palpations: Abdomen is soft. There is no hepatomegaly or mass.     Tenderness: There is no abdominal tenderness.  Lymphadenopathy:     Cervical: No cervical adenopathy.  Skin:    General: Skin is warm.     Findings: No erythema or rash.  Neurological:     Mental Status: He is alert and oriented to person, place, and time.     Cranial Nerves: No cranial nerve deficit.     Gait: Gait normal.  Psychiatric:     Comments: Well groomed, good eye contact.   ASSESSMENT AND PLAN:  Derrick Stokes was seen today for follow-up.  Diagnoses and all orders for this visit: Orders Placed This Encounter  Procedures   Pneumococcal polysaccharide vaccine 23-valent greater than or equal to 2yo subcutaneous/IM   Costal margin pain Right side, ? Muscle strain. Pain has resolved. I do not think imaging in needed.  Chronic bilateral low back pain with sciatica Problem has improved. Continue duloxetine 30 mg daily. Pending lumbar MRI.  Peripheral neuropathic pain Greatly improved with duloxetine 30 mg daily. He does not think treatment needs to be adjusted, so no changes today. Appropriate foot care recommended. Follow-up in 6 months, before if needed.  Atherosclerosis of aorta ([place]) We discussed diagnosis, prognosis, and treatment options. Stressed the importance of  smoking cessation. Continue atorvastatin 80 mg daily and Zetia 10 mg daily.  Essential (primary) hypertension DBP mildly elevated. Mild bradycardia noted today, we discussed some side effects of beta-blockers. For now recommend continue metoprolol succinate 50 mg daily. Instructed to monitor BP and HR at home regularly. He supposed to follow with cardiologist annually, next due in 10/2021.  Need for pneumococcal vaccination -     Pneumococcal polysaccharide vaccine 23-valent greater than or equal to 2yo subcutaneous/IM  Return in about 6 months (around 12/07/2021) for cpe.  Lorry Anastasi G. [name], MD  [facility]. [place] office.

## 2021-06-09 ENCOUNTER — Ambulatory Visit: Payer: Medicare HMO | Admitting: Family Medicine

## 2021-06-09 ENCOUNTER — Telehealth: Payer: Self-pay | Admitting: Pharmacist

## 2021-06-09 ENCOUNTER — Encounter: Payer: Self-pay | Admitting: Family Medicine

## 2021-06-09 ENCOUNTER — Ambulatory Visit (INDEPENDENT_AMBULATORY_CARE_PROVIDER_SITE_OTHER): Payer: Medicare HMO | Admitting: Family Medicine

## 2021-06-09 VITALS — BP 128/92 | HR 52 | Temp 97.6°F | Resp 16 | Ht 68.0 in | Wt 182.0 lb

## 2021-06-09 DIAGNOSIS — M5440 Lumbago with sciatica, unspecified side: Secondary | ICD-10-CM | POA: Diagnosis not present

## 2021-06-09 DIAGNOSIS — I10 Essential (primary) hypertension: Secondary | ICD-10-CM | POA: Diagnosis not present

## 2021-06-09 DIAGNOSIS — M792 Neuralgia and neuritis, unspecified: Secondary | ICD-10-CM

## 2021-06-09 DIAGNOSIS — Z23 Encounter for immunization: Secondary | ICD-10-CM | POA: Diagnosis not present

## 2021-06-09 DIAGNOSIS — I700 Atherosclerosis of aorta: Secondary | ICD-10-CM | POA: Diagnosis not present

## 2021-06-09 DIAGNOSIS — R0781 Pleurodynia: Secondary | ICD-10-CM | POA: Diagnosis not present

## 2021-06-09 DIAGNOSIS — G8929 Other chronic pain: Secondary | ICD-10-CM

## 2021-06-09 MED ORDER — DULOXETINE HCL 30 MG PO CPEP: 30.0000 mg | ORAL_CAPSULE | Freq: Every day | ORAL | 3 refills | Status: DC

## 2021-06-09 NOTE — Patient Instructions (Addendum)
A few things to remember from today's visit:  Chronic bilateral low back pain with sciatica, sciatica laterality unspecified - Plan: DULoxetine (CYMBALTA) 30 MG capsule  Peripheral neuropathic pain - Plan: DULoxetine (CYMBALTA) 30 MG capsule  Atherosclerosis of aorta ([place])  Essential (primary) hypertension  If you need refills please call your pharmacy. Do not use My Chart to request refills or for acute issues that need immediate attention.   Monitor blood pressure at home and heart rate.  No changes in medications today.  Please be sure medication list is accurate. If a new problem present, please set up appointment sooner than planned today. DASH Eating Plan DASH stands for Dietary Approaches to Stop Hypertension. The DASH eating plan is a healthy eating plan that has been shown to: Reduce high blood pressure (hypertension). Reduce your risk for type 2 diabetes, heart disease, and stroke. Help with weight loss. What are tips for following this plan? Reading food labels Check food labels for the amount of salt (sodium) per serving. Choose foods with less than 5 percent of the Daily Value of sodium. Generally, foods with less than 300 milligrams (mg) of sodium per serving fit into this eating plan. To find whole grains, look for the word "whole" as the first word in the ingredient list. Shopping Buy products labeled as "low-sodium" or "no salt added." Buy fresh foods. Avoid canned foods and pre-made or frozen meals. Cooking Avoid adding salt when cooking. Use salt-free seasonings or herbs instead of table salt or sea salt. Check with your health care provider or pharmacist before using salt substitutes. Do not fry foods. Cook foods using healthy methods such as baking, boiling, grilling, roasting, and broiling instead. Cook with heart-healthy oils, such as olive, canola, avocado, soybean, or sunflower oil. Meal planning  Eat a balanced diet that includes: 4 or more servings of  fruits and 4 or more servings of vegetables each day. Try to fill one-half of your plate with fruits and vegetables. 6-8 servings of whole grains each day. Less than 6 oz (170 g) of lean meat, poultry, or fish each day. A 3-oz (85-g) serving of meat is about the same size as a deck of cards. One egg equals 1 oz (28 g). 2-3 servings of low-fat dairy each day. One serving is 1 cup (237 mL). 1 serving of nuts, seeds, or beans 5 times each week. 2-3 servings of heart-healthy fats. Healthy fats called omega-3 fatty acids are found in foods such as walnuts, flaxseeds, fortified milks, and eggs. These fats are also found in cold-water fish, such as sardines, salmon, and mackerel. Limit how much you eat of: Canned or prepackaged foods. Food that is high in trans fat, such as some fried foods. Food that is high in saturated fat, such as fatty meat. Desserts and other sweets, sugary drinks, and other foods with added sugar. Full-fat dairy products. Do not salt foods before eating. Do not eat more than 4 egg yolks a week. Try to eat at least 2 vegetarian meals a week. Eat more home-cooked food and less restaurant, buffet, and fast food. Lifestyle When eating at a restaurant, ask that your food be prepared with less salt or no salt, if possible. If you drink alcohol: Limit how much you use to: 0-1 drink a day for women who are not pregnant. 0-2 drinks a day for men. Be aware of how much alcohol is in your drink. In the [place] one drink equals one 12 oz bottle of beer (355 mL),  one 5 oz glass of wine (148 mL), or one 1 oz glass of hard liquor (44 mL). General information Avoid eating more than 2,300 mg of salt a day. If you have hypertension, you may need to reduce your sodium intake to 1,500 mg a day. Work with your health care provider to maintain a healthy body weight or to lose weight. Ask what an ideal weight is for you. Get at least 30 minutes of exercise that causes your heart to beat faster  (aerobic exercise) most days of the week. Activities may include walking, swimming, or biking. Work with your health care provider or dietitian to adjust your eating plan to your individual calorie needs. What foods should I eat? Fruits All fresh, dried, or frozen fruit. Canned fruit in natural juice (without added sugar). Vegetables Fresh or frozen vegetables (raw, steamed, roasted, or grilled). Low-sodium or reduced-sodium tomato and vegetable juice. Low-sodium or reduced-sodium tomato sauce and tomato paste. Low-sodium or reduced-sodium canned vegetables. Grains Whole-grain or whole-wheat bread. Whole-grain or whole-wheat pasta. Brown rice. [place]. Bulgur. Whole-grain and low-sodium cereals. Pita bread. Low-fat, low-sodium crackers. Whole-wheat flour tortillas. Meats and other proteins Skinless chicken or [place]. Ground chicken or [place]. Pork with fat trimmed off. Fish and seafood. Egg whites. Dried beans, peas, or lentils. Unsalted nuts, nut butters, and seeds. Unsalted canned beans. Lean cuts of beef with fat trimmed off. Low-sodium, lean precooked or cured meat, such as sausages or meat loaves. Dairy Low-fat (1%) or fat-free (skim) milk. Reduced-fat, low-fat, or fat-free cheeses. Nonfat, low-sodium ricotta or cottage cheese. Low-fat or nonfat yogurt. Low-fat, low-sodium cheese. Fats and oils Soft margarine without trans fats. Vegetable oil. Reduced-fat, low-fat, or light mayonnaise and salad dressings (reduced-sodium). Canola, safflower, olive, avocado, soybean, and sunflower oils. Avocado. Seasonings and condiments Herbs. Spices. Seasoning mixes without salt. Other foods Unsalted popcorn and pretzels. Fat-free sweets. The items listed above may not be a complete list of foods and beverages you can eat. Contact a dietitian for more information. What foods should I avoid? Fruits Canned fruit in a light or heavy syrup. Fried fruit. Fruit in cream or butter  sauce. Vegetables Creamed or fried vegetables. Vegetables in a cheese sauce. Regular canned vegetables (not low-sodium or reduced-sodium). Regular canned tomato sauce and paste (not low-sodium or reduced-sodium). Regular tomato and vegetable juice (not low-sodium or reduced-sodium). Angie Fava. Olives. Grains Baked goods made with fat, such as croissants, muffins, or some breads. Dry pasta or rice meal packs. Meats and other proteins Fatty cuts of meat. Ribs. Fried meat. Berniece Salines. Bologna, salami, and other precooked or cured meats, such as sausages or meat loaves. Fat from the back of a pig (fatback). Bratwurst. Salted nuts and seeds. Canned beans with added salt. Canned or smoked fish. Whole eggs or egg yolks. Chicken or [place] with skin. Dairy Whole or 2% milk, cream, and half-and-half. Whole or full-fat cream cheese. Whole-fat or sweetened yogurt. Full-fat cheese. Nondairy creamers. Whipped toppings. Processed cheese and cheese spreads. Fats and oils Butter. Stick margarine. Lard. Shortening. Ghee. Bacon fat. Tropical oils, such as coconut, palm kernel, or palm oil. Seasonings and condiments Onion salt, garlic salt, seasoned salt, table salt, and sea salt. Worcestershire sauce. Tartar sauce. Barbecue sauce. Teriyaki sauce. Soy sauce, including reduced-sodium. Steak sauce. Canned and packaged gravies. Fish sauce. Oyster sauce. Cocktail sauce. Store-bought horseradish. Ketchup. Mustard. Meat flavorings and tenderizers. Bouillon cubes. Hot sauces. Pre-made or packaged marinades. Pre-made or packaged taco seasonings. Relishes. Regular salad dressings. Other foods Salted popcorn and pretzels. The  items listed above may not be a complete list of foods and beverages you should avoid. Contact a dietitian for more information. Where to find more information National Heart, Lung, and Blood Institute: https://wilson-eaton.com/ American Heart Association: www.heart.org Academy of Nutrition and Dietetics:  www.eatright.Economy: www.kidney.org Summary The DASH eating plan is a healthy eating plan that has been shown to reduce high blood pressure (hypertension). It may also reduce your risk for type 2 diabetes, heart disease, and stroke. When on the DASH eating plan, aim to eat more fresh fruits and vegetables, whole grains, lean proteins, low-fat dairy, and heart-healthy fats. With the DASH eating plan, you should limit salt (sodium) intake to 2,300 mg a day. If you have hypertension, you may need to reduce your sodium intake to 1,500 mg a day. Work with your health care provider or dietitian to adjust your eating plan to your individual calorie needs. This information is not intended to replace advice given to you by your health care provider. Make sure you discuss any questions you have with your health care provider. Document Revised: 06/09/2019 Document Reviewed: 06/09/2019 Elsevier Patient Education  2022 [employer].

## 2021-06-09 NOTE — Assessment & Plan Note (Signed)
DBP mildly elevated. Mild bradycardia noted today, we discussed some side effects of beta-blockers. For now recommend continue metoprolol succinate 50 mg daily. Instructed to monitor BP and HR at home regularly. He supposed to follow with cardiologist annually, next due in 10/2021.

## 2021-06-09 NOTE — Chronic Care Management (AMB) (Signed)
Chronic Care Management Pharmacy Assistant   Name: Derrick Stokes  MRN: [id number] DOB: 1951/06/27    Reason for Encounter: Medication Review Medication Coordination and HLD   Conditions to be addressed/monitored: HLD  Recent office visits:  None   Recent consult visits:  None   Hospital visits:  None in previous 6 months  Medications: Outpatient Encounter Medications as of 06/09/2021  Medication Sig   aspirin EC 81 MG tablet Take 81 mg by mouth at bedtime.    atorvastatin (LIPITOR) 80 MG tablet Take 1 tablet (80 mg total) by mouth daily at 6 PM.   clopidogrel (PLAVIX) 75 MG tablet TAKE 1 TABLET BY MOUTH EVERY DAY WITH BREAKFAST   DULoxetine (CYMBALTA) 30 MG capsule TAKE ONE CAPSULE BY MOUTH ONCE DAILY   ezetimibe (ZETIA) 10 MG tablet TAKE ONE TABLET BY MOUTH ONCE DAILY   metoprolol succinate (TOPROL-XL) 50 MG 24 hr tablet Take 1 tablet (50 mg total) by mouth daily. Take with or immediately following a meal.   nitroGLYCERIN (NITROSTAT) 0.4 MG SL tablet PLACE 1 TABLET UNDER THE TONGUE EVERY 5 MINUTES X 3 DOSES AS NEEDED FOR CHEST PAIN. (Patient taking differently: Place 0.4 mg under the tongue every 5 (five) minutes as needed for chest pain.)   Omega-3 Fatty Acids (FISH OIL) 1000 MG CPDR Take 1,000 mg by mouth daily.    ondansetron (ZOFRAN ODT) 8 MG disintegrating tablet Take 1 tablet (8 mg total) by mouth every 8 (eight) hours as needed for nausea or vomiting.   pantoprazole (PROTONIX) 40 MG tablet Take 1 tablet (40 mg total) by mouth daily.   vitamin B-12 (CYANOCOBALAMIN) 1000 MCG tablet Take 1,000 mcg by mouth daily.    No facility-administered encounter medications on file as of 06/09/2021.  06/09/2021 Name: Derrick Stokes MRN: [id number] DOB: Oct 01, 1950 Derrick Stokes is a 70 y.o. year old male who is a primary care patient of [name], Malka So, MD.  Comprehensive medication review performed; Spoke to patient regarding cholesterol  Lipid Panel    Component Value  Date/Time   CHOL 139 09/20/2020 0858   CHOL 133 03/08/2019 0905   TRIG 104.0 09/20/2020 0858   HDL 43.20 09/20/2020 0858   HDL 36 (L) 03/08/2019 0905   LDLCALC 75 09/20/2020 0858   LDLCALC 101 (H) 02/05/2020 1417   LDLDIRECT 68.0 11/19/2014 0737    10-year ASCVD risk score: The 10-year ASCVD risk score (Arnett DK, et al., 2019) is: 31.5%   Values used to calculate the score:     Age: 22 years     Sex: Male     Is Non-Hispanic African American: No     Diabetic: Yes     Tobacco smoker: Yes     Systolic Blood Pressure: 353 mmHg     Is BP treated: Yes     HDL Cholesterol: 43.2 mg/dL     Total Cholesterol: 139 mg/dL  Current antihyperlipidemic regimen:  Atorvastatin 80 mg 1 tablet daily Fish oil 1200 mg  1 capsule daily Zetia ASCVD risk enhancing conditions: age >53 Any recent hospitalizations or ED visits since last visit with CPP? No  Adherence Review: Does the patient have >5 day gap between last estimated fill dates? No   Reviewed chart for medication changes ahead of medication coordination call.  No OVs, Consults, or hospital visits since last care coordination call/Pharmacist visit.   No medication changes indicated.  BP Readings from Last 3 Encounters:  03/31/21 118/74  10/23/20 128/82  10/16/20  102/65    Lab Results  Component Value Date   HGBA1C 5.6 03/31/2021   Spoke with wife for call   Patient obtains medications through Adherence Packaging  30 Days   Last adherence delivery included:  Ezetimibe (Zetia) 10 mg: take on at Breakfast Metoprolol Succ.(Toprolol)  50 mg : take one at Breakfast Clopidogrel (Plavix) 75 mg : take one at Breakfast Atorvastatin (Lipitor)  80 mg: take one at Breakfast Pantoprazole (Protonix) 40 mg: take one at Breakfast Duloxetine (Cymbalta) 30 mg: take one at Breakfast.   Patient is due for next adherence delivery on: 06/19/21 Called patient and reviewed medications and coordinated delivery. (Spoke to Wife) Confirmed  Packaging for 30 DS  This delivery to include: Duloxetine (Cymbalta) 30 mg: take one at Breakfast. Ezetimibe (Zetia) 10 mg: take on at Breakfast Atorvastatin (Lipitor)  80 mg: take one at Breakfast Metoprolol Succ.(Toprolol)  50 mg : take one at Breakfast Pantoprazole (Protonix) 40 mg: take one at Breakfast Clopidogrel (Plavix) 75 mg : take one at Breakfast  Confirmed delivery date of 06/20/21, advised patient that pharmacy will contact them the morning of delivery.     Care Gaps: Zoster Vaccine - Overdue COVID Booster #3 [name]) - Overdue CCM - 07-16-21 AWV - Done 09-20-2020 BP - 129/82 (06/09/21)  Star Rating Drugs: Atorvastatin 80mg  - Last filled 05/20/21 30 DS at [facility] Pharmacist Assistant [phone]

## 2021-06-09 NOTE — Assessment & Plan Note (Signed)
We discussed diagnosis, prognosis, and treatment options. Stressed the importance of smoking cessation. Continue atorvastatin 80 mg daily and Zetia 10 mg daily.

## 2021-06-09 NOTE — Assessment & Plan Note (Signed)
Greatly improved with duloxetine 30 mg daily. He does not think treatment needs to be adjusted, so no changes today. Appropriate foot care recommended. Follow-up in 6 months, before if needed.

## 2021-06-09 NOTE — Assessment & Plan Note (Signed)
Problem has improved. Continue duloxetine 30 mg daily. Pending lumbar MRI.

## 2021-07-09 ENCOUNTER — Telehealth: Payer: Self-pay | Admitting: Pharmacist

## 2021-07-09 NOTE — Progress Notes (Signed)
Chronic Care Management Pharmacy Assistant   Name: Derrick Stokes  MRN: [id number] DOB: 1950/11/09  Reason for Encounter: Medication Review    Recent office visits:  06/09/21 [name], [name], MD - Patient presented for Chronic bilateral low back pain with sciatica and other concerns. No medication changes.  Recent consult visits:  None  Hospital visits:  None in previous 6 months  Medications: Outpatient Encounter Medications as of 07/09/2021  Medication Sig   aspirin EC 81 MG tablet Take 81 mg by mouth at bedtime.    atorvastatin (LIPITOR) 80 MG tablet Take 1 tablet (80 mg total) by mouth daily at 6 PM.   clopidogrel (PLAVIX) 75 MG tablet TAKE 1 TABLET BY MOUTH EVERY DAY WITH BREAKFAST   DULoxetine (CYMBALTA) 30 MG capsule Take 1 capsule (30 mg total) by mouth daily.   ezetimibe (ZETIA) 10 MG tablet TAKE ONE TABLET BY MOUTH ONCE DAILY   metoprolol succinate (TOPROL-XL) 50 MG 24 hr tablet Take 1 tablet (50 mg total) by mouth daily. Take with or immediately following a meal.   nitroGLYCERIN (NITROSTAT) 0.4 MG SL tablet PLACE 1 TABLET UNDER THE TONGUE EVERY 5 MINUTES X 3 DOSES AS NEEDED FOR CHEST PAIN. (Patient taking differently: Place 0.4 mg under the tongue every 5 (five) minutes as needed for chest pain.)   Omega-3 Fatty Acids (FISH OIL) 1000 MG CPDR Take 1,000 mg by mouth daily.    ondansetron (ZOFRAN ODT) 8 MG disintegrating tablet Take 1 tablet (8 mg total) by mouth every 8 (eight) hours as needed for nausea or vomiting.   pantoprazole (PROTONIX) 40 MG tablet Take 1 tablet (40 mg total) by mouth daily.   vitamin B-12 (CYANOCOBALAMIN) 1000 MCG tablet Take 1,000 mcg by mouth daily.    No facility-administered encounter medications on file as of 07/09/2021.  Reviewed chart prior to disease state call. Spoke with patient regarding BP  Recent Office Vitals: BP Readings from Last 3 Encounters:  06/09/21 (!) 128/92  03/31/21 118/74  10/23/20 128/82   Pulse Readings from  Last 3 Encounters:  06/09/21 (!) 52  03/31/21 63  10/23/20 67    Wt Readings from Last 3 Encounters:  06/09/21 182 lb (82.6 kg)  03/31/21 182 lb 6 oz (82.7 kg)  10/23/20 180 lb (81.6 kg)     Kidney Function Lab Results  Component Value Date/Time   CREATININE 0.80 03/31/2021 09:30 AM   CREATININE 0.81 10/07/2020 09:30 AM   CREATININE 0.84 02/05/2020 02:17 PM   GFR 90.92 03/31/2021 09:30 AM   GFRNONAA 95 03/08/2019 09:05 AM   GFRAA 109 03/08/2019 09:05 AM    BMP Latest Ref Rng & Units 03/31/2021 10/07/2020 09/20/2020  Glucose 70 - 99 mg/dL 85 105(H) 83  BUN 6 - 23 mg/dL 9 12 15   Creatinine 0.40 - 1.50 mg/dL 0.80 0.81 0.79  BUN/Creat Ratio 10 - 24 - 15 -  Sodium 135 - 145 mEq/L 137 136 139  Potassium 3.5 - 5.1 mEq/L 4.8 4.9 4.5  Chloride 96 - 112 mEq/L 99 98 103  CO2 19 - 32 mEq/L 28 21 27   Calcium 8.4 - 10.5 mg/dL 9.8 9.6 9.4    Current antihypertensive regimen:  None (Checking for Metoprolol use) How often are you checking your Blood Pressure? 1-2x per week Current home BP readings:  BP Readings from Last 3 Encounters:  06/09/21 (!) 128/92  03/31/21 118/74  10/23/20 128/82   What recent interventions/DTPs have been made by any provider to improve Blood  Pressure control since last CPP Visit: Wife reports none Any recent hospitalizations or ED visits since last visit with CPP? No Adherence Review: Is the patient currently on ACE/ARB medication? No Does the patient have >5 day gap between last estimated fill dates? No   Reviewed chart for medication changes ahead of medication coordination call.  No OVs, Consults, or hospital visits since last care coordination call/Pharmacist visit.  No medication changes indicated  BP Readings from Last 3 Encounters:  06/09/21 (!) 128/92  03/31/21 118/74  10/23/20 128/82    Lab Results  Component Value Date   HGBA1C 5.6 03/31/2021     Patient obtains medications through Adherence Packaging  30 Days   Last adherence  delivery included:  Duloxetine (Cymbalta) 30 mg: take one at Breakfast. Ezetimibe (Zetia) 10 mg: take on at Breakfast Atorvastatin (Lipitor)  80 mg: take one at Breakfast Metoprolol Succ.(Toprolol)  50 mg : take one at Breakfast Pantoprazole (Protonix) 40 mg: take one at Breakfast Clopidogrel (Plavix) 75 mg : take one at Breakfast   Patient is due for next adherence delivery on: 07/21/21. Called patient and reviewed medications and coordinated delivery. Confirmed packs for 30 DS Spoke to wife for call  This delivery to include: Duloxetine (Cymbalta) 30 mg: take one at Breakfast. Ezetimibe (Zetia) 10 mg: take on at Breakfast Atorvastatin (Lipitor)  80 mg: take one at Breakfast Metoprolol Succ.(Toprolol)  50 mg : take one at Breakfast Pantoprazole (Protonix) 40 mg: take one at Breakfast Clopidogrel (Plavix) 75 mg : take one at Breakfast   Confirmed delivery date of 07/21/21, advised patient that pharmacy will contact them the morning of delivery.   Care Gaps: COVID Booster #3 [name]) - Overdue CCM - 07-16-21 AWV - Done 09-20-2020 BP - 129/82 (06/09/21)  Star Rating Drugs: Atorvastatin 80mg  - Last filled 06/16/21 30 DS at [facility] Pharmacist Assistant [phone]

## 2021-07-15 ENCOUNTER — Telehealth: Payer: Self-pay | Admitting: Pharmacist

## 2021-07-15 NOTE — Chronic Care Management (AMB) (Signed)
° ° °  Chronic Care Management Pharmacy Assistant   Name: Derrick Stokes  MRN: [id number] DOB: [birth year]  07/15/21 APPOINTMENT REMINDER   Called Patient No answer, left message of appointment on 07/16/21 at 8:30 via office visit with Jeni Salles, Pharm D.   Notified to have all medications, supplements, blood pressure and/or blood sugar logs available during appointment and to return call if need to reschedule.    Care Gaps: COVID Booster #3 [name]) - Overdue CCM - 07-16-21 AWV - Done 09-20-2020 BP - 129/82 (06/09/21  Star Rating Drug: Atorvastatin 80mg  - Last filled 06/16/21 30 DS at [facility]  Any gaps in medications fill history?  None timo  Medications: Outpatient Encounter Medications as of 07/15/2021  Medication Sig   aspirin EC 81 MG tablet Take 81 mg by mouth at bedtime.    atorvastatin (LIPITOR) 80 MG tablet Take 1 tablet (80 mg total) by mouth daily at 6 PM.   clopidogrel (PLAVIX) 75 MG tablet TAKE 1 TABLET BY MOUTH EVERY DAY WITH BREAKFAST   DULoxetine (CYMBALTA) 30 MG capsule Take 1 capsule (30 mg total) by mouth daily.   ezetimibe (ZETIA) 10 MG tablet TAKE ONE TABLET BY MOUTH ONCE DAILY   metoprolol succinate (TOPROL-XL) 50 MG 24 hr tablet Take 1 tablet (50 mg total) by mouth daily. Take with or immediately following a meal.   nitroGLYCERIN (NITROSTAT) 0.4 MG SL tablet PLACE 1 TABLET UNDER THE TONGUE EVERY 5 MINUTES X 3 DOSES AS NEEDED FOR CHEST PAIN. (Patient taking differently: Place 0.4 mg under the tongue every 5 (five) minutes as needed for chest pain.)   Omega-3 Fatty Acids (FISH OIL) 1000 MG CPDR Take 1,000 mg by mouth daily.    ondansetron (ZOFRAN ODT) 8 MG disintegrating tablet Take 1 tablet (8 mg total) by mouth every 8 (eight) hours as needed for nausea or vomiting.   pantoprazole (PROTONIX) 40 MG tablet Take 1 tablet (40 mg total) by mouth daily.   vitamin B-12 (CYANOCOBALAMIN) 1000 MCG tablet Take 1,000 mcg by mouth daily.    No  facility-administered encounter medications on file as of 07/15/2021.     [facility] Clinical Pharmacist Assistant [phone]

## 2021-07-15 NOTE — Progress Notes (Signed)
Chronic Care Management Pharmacy Note  07/19/2021 Name:  Derrick Stokes MRN:  [id number] DOB:  1950/12/14  Summary: LDL not at goal < 70 Pt continues to drink and smoke  Recommendations/Changes made from today's visit: -Recommended adding in more water throughout the day -Recommended discussing with neurology about possible tremor -Recommend repeat lipid panel -Recommended ordering annual low dose CT scan  Plan: BP assessment in 2 months   Subjective: Derrick Stokes is an 70 y.o. year old male who is a primary patient of [name], Malka So, MD.  The CCM team was consulted for assistance with disease management and care coordination needs.    Engaged with patient face to face for follow up visit in response to provider referral for pharmacy case management and/or care coordination services.   Consent to Services:  The patient was given information about Chronic Care Management services, agreed to services, and gave verbal consent prior to initiation of services.  Please see initial visit note for detailed documentation.   Patient Care Team: [name], [name], MD as PCP - General (Family Medicine) Sherren Mocha, MD as PCP - Cardiology (Cardiology) Viona Gilmore, [facility] as Pharmacist (Pharmacist)  Recent office visits: 06/09/21 [name], [name], MD - Patient presented for Chronic bilateral low back pain with sciatica and other concerns. No medication changes.  03-31-2021 [name], [name], MD - Patient presented for Chronic bilateral low back pain with sciatica, sciatica laterality unspecified. Prescribed Duloxetine 30 mg daily.  Recent consult visits: 10-23-2020 Leanor Kail, [place] (Cardiology)  - Patient presented for Coronary artery disease involving native coronary artery of native heart with angina pectoris and other concerns. Prescribed Metoprolol Succinate 60m daily Stopped Metoprolol Tartrate.   10-07-2020 CSherren Mocha MD (Cardiology) - Patient presented for  Essential hypertension and other issues. No medication changes.    10-01-2020 JEulah Pont(Audiology) - Patient presented for hearing loss and other issues. No medication changes found.  Hospital visits: Medication Reconciliation was completed by comparing discharge summary, patients EMR and Pharmacy list, and upon discussion with patient.   [facility]. He was there for 5 hours   [facility]   Medication Changes at Hospital Discharge: -Changed None   Medications Discontinued at Hospital Discharge: -Stopped None   Medications that remain the same after Hospital Discharge:??  -All other medications will remain the same.     Objective:  Lab Results  Component Value Date   CREATININE 0.80 03/31/2021   BUN 9 03/31/2021   GFR 90.92 03/31/2021   GFRNONAA 95 03/08/2019   GFRAA 109 03/08/2019   NA 137 03/31/2021   K 4.8 03/31/2021   CALCIUM 9.8 03/31/2021   CO2 28 03/31/2021   GLUCOSE 85 03/31/2021    Lab Results  Component Value Date/Time   HGBA1C 5.6 03/31/2021 09:30 AM   HGBA1C 5.3 09/14/2018 08:06 AM   GFR 90.92 03/31/2021 09:30 AM   GFR 91.61 09/20/2020 08:58 AM   MICROALBUR <0.7 03/31/2021 09:30 AM    Last diabetic Eye exam: No results found for: HMDIABEYEEXA  Last diabetic Foot exam: No results found for: HMDIABFOOTEX   Lab Results  Component Value Date   CHOL 139 09/20/2020   HDL 43.20 09/20/2020   LDLCALC 75 09/20/2020   LDLDIRECT 68.0 11/19/2014   TRIG 104.0 09/20/2020   CHOLHDL 3 09/20/2020    Hepatic Function Latest Ref Rng & Units 03/31/2021 02/05/2020 03/08/2019  Total  Protein 6.0 - 8.3 g/dL 6.9 7.0 7.0  Albumin 3.5 - 5.2 g/dL 4.2 - 4.3  AST 0 - 37 U/L _0 ALT 0 - 53 U/L 24 36 44  Alk Phosphatase 39 - 117 U/L 66 - 80  Total Bilirubin 0.2 - 1.2 mg/dL 1.0 1.0 0.9  Bilirubin, Direct 0.00 - 0.40 mg/dL - - 0.23    Lab  Results  Component Value Date/Time   TSH 1.75 03/31/2021 09:30 AM   TSH 3.180 09/16/2014 06:15 PM    CBC Latest Ref Rng & Units 10/07/2020 02/05/2020 12/29/2018  WBC 3.4 - 10.8 x10E3/uL 7.0 8.5 7.0  Hemoglobin 13.0 - 17.7 g/dL 16.5 16.2 15.7  Hematocrit 37.5 - 51.0 % 47.8 47.5 45.4  Platelets 150 - 450 x10E3/uL 261 245 230    No results found for: VD25OH  Clinical ASCVD: Yes  The 10-year ASCVD risk score (Arnett DK, et al., 2019) is: 33.1%   Values used to calculate the score:     Age: 70 years     Sex: Male     Is Non-Hispanic African American: No     Diabetic: Yes     Tobacco smoker: Yes     Systolic Blood Pressure: 892 mmHg     Is BP treated: Yes     HDL Cholesterol: 43.2 mg/dL     Total Cholesterol: 139 mg/dL    Depression screen [facility] 2/9 06/09/2021 09/20/2020 09/18/2019  Decreased Interest 0 0 0  Down, Depressed, Hopeless 0 0 0  PHQ - 2 Score 0 0 0      Social History   Tobacco Use  Smoking Status Every Day   Packs/day: 1.00   Years: 51.00   Pack years: 51.00   Types: Cigarettes   Start date: 1970  Smokeless Tobacco Never   BP Readings from Last 3 Encounters:  07/16/21 122/90  06/09/21 (!) 128/92  03/31/21 118/74   Pulse Readings from Last 3 Encounters:  06/09/21 (!) 52  03/31/21 63  10/23/20 67   Wt Readings from Last 3 Encounters:  06/09/21 182 lb (82.6 kg)  03/31/21 182 lb 6 oz (82.7 kg)  10/23/20 180 lb (81.6 kg)   BMI Readings from Last 3 Encounters:  06/09/21 27.67 kg/m  03/31/21 27.73 kg/m  10/23/20 27.37 kg/m    Assessment/Interventions: Review of patient past medical history, allergies, medications, health status, including review of consultants reports, laboratory and other test data, was performed as part of comprehensive evaluation and provision of chronic care management services.   SDOH:  (Social Determinants of Health) assessments and interventions performed: Yes   SDOH Screenings   Alcohol Screen: Not on file  Depression  (PHQ2-9): Low Risk    PHQ-2 Score: 0  Financial Resource Strain: Low Risk    Difficulty of Paying Living Expenses: Not very hard  Food Insecurity: Not on file  Housing: Not on file  Physical Activity: Not on file  Social Connections: Not on file  Stress: Not on file  Tobacco Use: High Risk   Smoking Tobacco Use: Every Day   Smokeless Tobacco Use: Never   Passive Exposure: Not on file  Transportation Needs: No Transportation Needs   Lack of Transportation (Medical): No   Lack of Transportation (Non-Medical): No     CCM Care Plan  Allergies  Allergen Reactions   Iodine Nausea And Vomiting    Turns white in face and sweaty   Iohexol Other (See Comments)    May have caused nausea and  vomiting several years ago    Shrimp [Shellfish Allergy] Nausea And Vomiting    Turns white in face and sweaty   Isosorbide Dinitrate Other (See Comments)    headache    Medications Reviewed Today     Reviewed by Viona Gilmore, [facility] (Pharmacist) on 07/19/21 at 1431  Med List Status: <None>   Medication Order Taking? Sig Documenting Provider Last Dose Status Informant  aspirin EC 81 MG tablet [id number] No Take 81 mg by mouth at bedtime.  [provider] Taking Active Spouse/Significant Other  atorvastatin (LIPITOR) 80 MG tablet [id number] No Take 1 tablet (80 mg total) by mouth daily at 6 PM. Sherren Mocha, MD Taking Active   clopidogrel (PLAVIX) 75 MG tablet [id number] No TAKE 1 TABLET BY MOUTH EVERY DAY WITH Violet Baldy, MD Taking Active   DULoxetine (CYMBALTA) 30 MG capsule [id number]  Take 1 capsule (30 mg total) by mouth daily. [name], [name], MD  Active   ezetimibe (ZETIA) 10 MG tablet [id number] No TAKE ONE TABLET BY MOUTH ONCE DAILY [name], [name], MD Taking Active   metoprolol succinate (TOPROL-XL) 50 MG 24 hr tablet [id number] No Take 1 tablet (50 mg total) by mouth daily. Take with or immediately following a meal. Sherren Mocha, MD Taking Active   nitroGLYCERIN  (NITROSTAT) 0.4 MG SL tablet [id number] No PLACE 1 TABLET UNDER THE TONGUE EVERY 5 MINUTES X 3 DOSES AS NEEDED FOR CHEST PAIN.  Patient taking differently: Place 0.4 mg under the tongue every 5 (five) minutes as needed for chest pain.   [name], [name], MD Taking Active            Med Note Luana Shu, NATASHA   Wed Oct 23, 2020  2:07 PM)    Omega-3 Fatty Acids (FISH OIL) 1000 MG CPDR [id number] No Take 1,000 mg by mouth daily.  [provider] Taking Active Spouse/Significant Other  ondansetron (ZOFRAN ODT) 8 MG disintegrating tablet [id number] No Take 1 tablet (8 mg total) by mouth every 8 (eight) hours as needed for nausea or vomiting. Mansouraty, Telford Nab., MD Taking Active Spouse/Significant Other  pantoprazole (PROTONIX) 40 MG tablet [id number] No Take 1 tablet (40 mg total) by mouth daily. Sherren Mocha, MD Taking Active   vitamin B-12 (CYANOCOBALAMIN) 1000 MCG tablet [id number] No Take 1,000 mcg by mouth daily.  [provider] Taking Active Spouse/Significant Other            Patient Active Problem List   Diagnosis Date Noted   Atherosclerosis of aorta ([place]) 06/09/2021   Essential (primary) hypertension 06/09/2021   Regurgitation of food 02/18/2020   Barrett's esophagus without dysplasia 02/18/2020   Bloating 02/18/2020   History of adenomatous polyp of colon 02/18/2020   Change in bowel habits 02/18/2020   Hiatal hernia 02/18/2020   Claudication ([place]) 09/18/2019   Coronary artery disease involving native coronary artery of native heart with angina pectoris ([place]) 09/18/2019   Chest pain wtih stable CAD at cath.   03/18/2018   Reactive airway disease 03/18/2018   Coronary artery disease with unstable angina pectoris ([place]) 03/16/2018   Reactive airway disease that is not asthma, has been coughing more, may be cause of chest pain 09/08/2016   B12 deficiency 06/08/2016   GERD (gastroesophageal reflux disease) 06/08/2016   Chronic bilateral low back pain with sciatica  06/08/2016   Peripheral neuropathic pain 06/08/2016   CAD (coronary artery disease), native coronary artery 09/17/2014   Unstable angina (HCC) 09/16/2014   Tobacco abuse  01/03/2014   S/P coronary artery bypass graft x 5 03/06/2011   Hyperlipidemia 53/66/4403   HELICOBACTER PYLORI INFECTION 11/05/2008   BARRETTS ESOPHAGUS 11/05/2008   HIATAL HERNIA 11/05/2008   DIVERTICULOSIS, COLON 11/05/2008   COLONIC POLYPS, HX OF 11/05/2008   GASTRITIS 10/22/2008   Anxiety state 10/12/2008   EARLY SATIETY 10/12/2008   CHANGE IN BOWELS 10/12/2008   ABDOMINAL PAIN -GENERALIZED 10/12/2008   ABDOMINAL PAIN, LEFT UPPER QUADRANT 10/01/2008    Immunization History  Administered Date(s) Administered   Fluad Quad(high Dose 65+) 03/31/2021   Influenza, High Dose Seasonal PF 09/14/2018, 04/25/2019, 04/23/2020   Influenza,inj,Quad PF,6+ Mos 04/17/2014, 05/06/2015, 06/08/2016   Influenza,inj,Quad PF,6-35 Mos 04/25/2019   Moderna Sars-Covid-2 Vaccination 08/15/2019, 09/12/2019   Pneumococcal Conjugate-13 09/14/2018   Pneumococcal Polysaccharide-23 06/08/2016, 06/09/2021   Tdap 05/01/2014   Tetanus 04/17/2014   Zoster, Live 05/01/2014   Patient is feeling much better than the last visit and is amazed by how much his pain has improved with the duloxetine.   Patient continues to drink several beers a day and is still smoking and reports he thinks he could give up the smoking before the drinking. Patient knows he shouldn't smoke but likes drinking coffee with cigarettes. He also knows they are expensive and they have even calculated how much they spend on it, but reports it is difficult because his wife also continues to smoke. He has quit cold [place] in the past and gave it up for 6 months. His daughter has lung cancer and she still smokes a bit as well and it has been tough seeing this.  H reports he has fidgeting and beer takes the nervousness away. Discussed the possibility of a tremor and should discuss  with neurology. They have not yet scheduled the MRI even though he was finally approved but plan to call [facility] imaging to schedule.  He also continues to only drink beer and sometimes sweet tea and no water. Recommended substituting some beer for water.  Conditions to be addressed/monitored:  Hyperlipidemia, Coronary Artery Disease, GERD, and Tobacco use  Conditions addressed this visit: Tobacco use, hyperlipidemia  Care Plan : [place]  Updates made by Viona Gilmore, [place] since 07/19/2021 12:00 AM     Problem: Problem: Hyperlipidemia, Coronary Artery Disease, GERD, and Tobacco use      Long-Range Goal: Patient-Specific Goal   Start Date: 02/28/2021  Expected End Date: 02/28/2022  Recent Progress: On track  Priority: High  Note:   Current Barriers:  Unable to independently afford treatment regimen Unable to independently monitor therapeutic efficacy Unable to achieve control of cholesterol  Unable to self administer medications as prescribed  Pharmacist Clinical Goal(s):  Patient will verbalize ability to afford treatment regimen achieve adherence to monitoring guidelines and medication adherence to achieve therapeutic efficacy achieve control of cholesterol as evidenced by next lipid panel  through collaboration with PharmD and provider.   Interventions: 1:1 collaboration with [name], [name], MD regarding development and update of comprehensive plan of care as evidenced by provider attestation and co-signature Inter-disciplinary care team collaboration (see longitudinal plan of care) Comprehensive medication review performed; medication list updated in electronic medical record  Hyperlipidemia: (LDL goal < 70) -Uncontrolled -Current treatment: Atorvastatin 80 mg 1 tablet daily Fish oil 1200 mg  1 capsule daily Zetia 10 mg 1 tablet daily -Medications previously tried: none -Current dietary patterns: eating only breakfast most days -Current exercise  habits: active during the day; no formal exercise -Educated on Cholesterol goals;  Benefits of statin for ASCVD risk reduction; Importance of limiting foods high in cholesterol; Exercise goal of 150 minutes per week; -Counseled on diet and exercise extensively Recommended to continue current medication Recommended repeat lipid panel.  CAD (Goal: prevent heart events) -Controlled -Current treatment  Aspirin 81 mg 1 tablet daily Atorvastatin 80 mg 1 tablet daily Clopidogrel 75 mg 1 tablet daily with breakfast Nitroglycerin 0.4 mg SL as needed Metoprolol succinate 50 mg 1 tablet daily -Medications previously tried: none  -Current BP readings: 133/92, 137/102 (bought new arm cuff) -Recommended to continue current medication Counseled on verifying expiration date of nitroglycerin and having a non expired on hand supply. Recommended checking blood pressure weekly with use of metoprolol and bringing cuff to next in office appointment to verify accuracy.  Tobacco use (Goal quit smoking) -Uncontrolled -Previous quit attempts: cold [place] -Current treatment  No medications -Patient smokes Within 30 minutes of waking -Patient triggers include: stress and pain -On a scale of 1-10, reports MOTIVATION to quit is 7 -On a scale of 1-10, reports CONFIDENCE in quitting is 7 -Provided contact information for [place] Quit Line (1-800-QUIT-NOW) and encouraged patient to reach out to this group for support. -Recommended thinking about reasons to quit smoking and finding motivation to do so. Recommended ordering annual low dose CT scan.  GERD/Barrett's esophagus (Goal: minimize further damage to esophagus) -Controlled -Current treatment  Pantoprazole 40 mg 1 tablet daily -Medications previously tried: none  -Counseled on non-pharmacologic management of symptoms such as elevating the head of your bed, avoiding eating 2-3 hours before bed, avoiding triggering foods such as acidic, spicy, or fatty foods,  eating smaller meals, and wearing clothes that are loose around the waist  Educated on smoking cessation will prevent further damage to esophagus.  Health Maintenance -Vaccine gaps: shingrix, COVID booster -Current therapy:  Ondansetron 8 mg as needed Vitamin B12 1000 mcg 1 tablet daily -Educated on Cost vs benefit of each product must be carefully weighed by individual consumer -Patient is satisfied with current therapy and denies issues -Recommended to continue current medication  Patient Goals/Self-Care Activities Patient will:  - focus on medication adherence by moving all medications to be administered at the same time. check blood pressure weekly, document, and provide at future appointments target a minimum of 150 minutes of moderate intensity exercise weekly  Follow Up Plan: Face to Face appointment with care management team member scheduled for:  4 months      Medication Assistance: None required.  Patient affirms current coverage meets needs.  Compliance/Adherence/Medication fill history: Care Gaps: Shingrix, COVID booster BP - 129/82 (06/09/21)  Star-Rating Drugs: Atorvastatin 28m - Last filled 06/16/21 30 DS at [facility]  Patient's preferred pharmacy is:  [facility] [place] [place]Phone: [phone]: [phone]  Uses pill box? Yes Pt endorses 85% compliance - misses atorvastatin and aspirin at bedtime  We discussed: Benefits of medication synchronization, packaging and delivery as well as enhanced pharmacist oversight with [facility]. Patient decided to: Utilize [facility] pharmacy for medication synchronization, packaging and delivery  Care Plan and Follow Up Patient Decision:  Patient agrees to Care Plan and Follow-up.  Plan: Face to Face appointment with care management team member scheduled for: 4 months  MJeni Salles PharmD, [place] Pharmacist [place]at [phone]

## 2021-07-16 ENCOUNTER — Ambulatory Visit (INDEPENDENT_AMBULATORY_CARE_PROVIDER_SITE_OTHER): Payer: Medicare HMO | Admitting: Pharmacist

## 2021-07-16 VITALS — BP 122/90

## 2021-07-16 DIAGNOSIS — E785 Hyperlipidemia, unspecified: Secondary | ICD-10-CM

## 2021-07-16 DIAGNOSIS — Z720 Tobacco use: Secondary | ICD-10-CM

## 2021-07-19 DIAGNOSIS — F17210 Nicotine dependence, cigarettes, uncomplicated: Secondary | ICD-10-CM

## 2021-07-19 DIAGNOSIS — E785 Hyperlipidemia, unspecified: Secondary | ICD-10-CM

## 2021-07-19 DIAGNOSIS — I2510 Atherosclerotic heart disease of native coronary artery without angina pectoris: Secondary | ICD-10-CM

## 2021-07-19 DIAGNOSIS — R69 Illness, unspecified: Secondary | ICD-10-CM | POA: Diagnosis not present

## 2021-07-19 NOTE — Patient Instructions (Signed)
Hi Derrick Stokes and Derrick Stokes,  It was great to see you again! Don't forget to call [facility] imaging to schedule your MRI and talk to neurology about a possible tremor.   Also, please try to drink more water like we discussed! This should prevent you from getting dehydrated.  Please reach out to me if you have any questions or need anything!  Best, Maddie  Jeni Salles, PharmD, [place] Pharmacist [place] [facility] [place]   Visit Information   Goals Addressed   None    Patient Care Plan: CCM Pharmacy Care Plan     Problem Identified: Problem: Hyperlipidemia, Coronary Artery Disease, GERD, and Tobacco use      Long-Range Goal: Patient-Specific Goal   Start Date: 02/28/2021  Expected End Date: 02/28/2022  Recent Progress: On track  Priority: High  Note:   Current Barriers:  Unable to independently afford treatment regimen Unable to independently monitor therapeutic efficacy Unable to achieve control of cholesterol  Unable to self administer medications as prescribed  Pharmacist Clinical Goal(s):  Patient will verbalize ability to afford treatment regimen achieve adherence to monitoring guidelines and medication adherence to achieve therapeutic efficacy achieve control of cholesterol as evidenced by next lipid panel  through collaboration with PharmD and provider.   Interventions: 1:1 collaboration with [name], [name], MD regarding development and update of comprehensive plan of care as evidenced by provider attestation and co-signature Inter-disciplinary care team collaboration (see longitudinal plan of care) Comprehensive medication review performed; medication list updated in electronic medical record  Hyperlipidemia: (LDL goal < 70) -Uncontrolled -Current treatment: Atorvastatin 80 mg 1 tablet daily Fish oil 1200 mg  1 capsule daily Zetia 10 mg 1 tablet daily -Medications previously tried: none -Current dietary patterns: eating only  breakfast most days -Current exercise habits: active during the day; no formal exercise -Educated on Cholesterol goals;  Benefits of statin for ASCVD risk reduction; Importance of limiting foods high in cholesterol; Exercise goal of 150 minutes per week; -Counseled on diet and exercise extensively Recommended to continue current medication Recommended repeat lipid panel.  CAD (Goal: prevent heart events) -Controlled -Current treatment  Aspirin 81 mg 1 tablet daily Atorvastatin 80 mg 1 tablet daily Clopidogrel 75 mg 1 tablet daily with breakfast Nitroglycerin 0.4 mg SL as needed Metoprolol succinate 50 mg 1 tablet daily -Medications previously tried: none  -Current BP readings: 133/92, 137/102 (bought new arm cuff) -Recommended to continue current medication Counseled on verifying expiration date of nitroglycerin and having a non expired on hand supply. Recommended checking blood pressure weekly with use of metoprolol and bringing cuff to next in office appointment to verify accuracy.  Tobacco use (Goal quit smoking) -Uncontrolled -Previous quit attempts: cold [place] -Current treatment  No medications -Patient smokes Within 30 minutes of waking -Patient triggers include: stress and pain -On a scale of 1-10, reports MOTIVATION to quit is 7 -On a scale of 1-10, reports CONFIDENCE in quitting is 7 -Provided contact information for [place] Quit Line (1-800-QUIT-NOW) and encouraged patient to reach out to this group for support. -Recommended thinking about reasons to quit smoking and finding motivation to do so. Recommended ordering annual low dose CT scan.  GERD/Barrett's esophagus (Goal: minimize further damage to esophagus) -Controlled -Current treatment  Pantoprazole 40 mg 1 tablet daily -Medications previously tried: none  -Counseled on non-pharmacologic management of symptoms such as elevating the head of your bed, avoiding eating 2-3 hours before bed, avoiding triggering foods  such as acidic, spicy, or fatty foods, eating smaller  meals, and wearing clothes that are loose around the waist  Educated on smoking cessation will prevent further damage to esophagus.  Health Maintenance -Vaccine gaps: shingrix, COVID booster -Current therapy:  Ondansetron 8 mg as needed Vitamin B12 1000 mcg 1 tablet daily -Educated on Cost vs benefit of each product must be carefully weighed by individual consumer -Patient is satisfied with current therapy and denies issues -Recommended to continue current medication  Patient Goals/Self-Care Activities Patient will:  - focus on medication adherence by moving all medications to be administered at the same time. check blood pressure weekly, document, and provide at future appointments target a minimum of 150 minutes of moderate intensity exercise weekly  Follow Up Plan: Face to Face appointment with care management team member scheduled for:  4 months       Patient verbalizes understanding of instructions provided today and agrees to view in [place].  The pharmacy team will reach out to the patient again over the next 30 days.   Viona Gilmore, [facility]

## 2021-08-07 ENCOUNTER — Telehealth: Payer: Self-pay | Admitting: Pharmacist

## 2021-08-07 NOTE — Progress Notes (Signed)
Chronic Care Management Pharmacy Assistant   Name: Derrick Stokes  MRN: [id number] DOB: 04-24-1951  Reason for Encounter: Medication Review Medication Coordination   Conditions to be addressed/monitored: HTN  Recent office visits:  None  Recent consult visits:  None  Hospital visits:  None in previous 6 months  Medications: Outpatient Encounter Medications as of 08/07/2021  Medication Sig   aspirin EC 81 MG tablet Take 81 mg by mouth at bedtime.    atorvastatin (LIPITOR) 80 MG tablet Take 1 tablet (80 mg total) by mouth daily at 6 PM.   clopidogrel (PLAVIX) 75 MG tablet TAKE 1 TABLET BY MOUTH EVERY DAY WITH BREAKFAST   DULoxetine (CYMBALTA) 30 MG capsule Take 1 capsule (30 mg total) by mouth daily.   ezetimibe (ZETIA) 10 MG tablet TAKE ONE TABLET BY MOUTH ONCE DAILY   metoprolol succinate (TOPROL-XL) 50 MG 24 hr tablet Take 1 tablet (50 mg total) by mouth daily. Take with or immediately following a meal.   nitroGLYCERIN (NITROSTAT) 0.4 MG SL tablet PLACE 1 TABLET UNDER THE TONGUE EVERY 5 MINUTES X 3 DOSES AS NEEDED FOR CHEST PAIN. (Patient taking differently: Place 0.4 mg under the tongue every 5 (five) minutes as needed for chest pain.)   Omega-3 Fatty Acids (FISH OIL) 1000 MG CPDR Take 1,000 mg by mouth daily.    ondansetron (ZOFRAN ODT) 8 MG disintegrating tablet Take 1 tablet (8 mg total) by mouth every 8 (eight) hours as needed for nausea or vomiting.   pantoprazole (PROTONIX) 40 MG tablet Take 1 tablet (40 mg total) by mouth daily.   vitamin B-12 (CYANOCOBALAMIN) 1000 MCG tablet Take 1,000 mcg by mouth daily.    No facility-administered encounter medications on file as of 08/07/2021.  Reviewed chart prior to disease state call. Spoke with patient regarding BP  Recent Office Vitals: BP Readings from Last 3 Encounters:  07/16/21 122/90  06/09/21 (!) 128/92  03/31/21 118/74   Pulse Readings from Last 3 Encounters:  06/09/21 (!) 52  03/31/21 63  10/23/20 67    Wt  Readings from Last 3 Encounters:  06/09/21 182 lb (82.6 kg)  03/31/21 182 lb 6 oz (82.7 kg)  10/23/20 180 lb (81.6 kg)     Kidney Function Lab Results  Component Value Date/Time   CREATININE 0.80 03/31/2021 09:30 AM   CREATININE 0.81 10/07/2020 09:30 AM   CREATININE 0.84 02/05/2020 02:17 PM   GFR 90.92 03/31/2021 09:30 AM   GFRNONAA 95 03/08/2019 09:05 AM   GFRAA 109 03/08/2019 09:05 AM    BMP Latest Ref Rng & Units 03/31/2021 10/07/2020 09/20/2020  Glucose 70 - 99 mg/dL 85 105(H) 83  BUN 6 - 23 mg/dL 9 12 15   Creatinine 0.40 - 1.50 mg/dL 0.80 0.81 0.79  BUN/Creat Ratio 10 - 24 - 15 -  Sodium 135 - 145 mEq/L 137 136 139  Potassium 3.5 - 5.1 mEq/L 4.8 4.9 4.5  Chloride 96 - 112 mEq/L 99 98 103  CO2 19 - 32 mEq/L 28 21 27   Calcium 8.4 - 10.5 mg/dL 9.8 9.6 9.4    Current antihypertensive regimen:  None Current home BP readings:  BP Readings from Last 3 Encounters:  07/16/21 122/90  06/09/21 (!) 128/92  03/31/21 118/74    Adherence Review: Is the patient currently on ACE/ARB medication? No Does the patient have >5 day gap between last estimated fill dates? No  Reviewed chart for medication changes ahead of medication coordination call.  No OVs, Consults, or hospital  visits since last care coordination call/Pharmacist visit.   No medication changes indicated.  BP Readings from Last 3 Encounters:  07/16/21 122/90  06/09/21 (!) 128/92  03/31/21 118/74    Lab Results  Component Value Date   HGBA1C 5.6 03/31/2021     Patient obtains medications through Adherence Packaging  30 Days   Last adherence delivery included:  Duloxetine (Cymbalta) 30 mg: take one at Breakfast. Ezetimibe (Zetia) 10 mg: take on at Breakfast Atorvastatin (Lipitor)  80 mg: take one at Breakfast Metoprolol Succ.(Toprolol)  50 mg : take one at Breakfast Pantoprazole (Protonix) 40 mg: take one at Breakfast Clopidogrel (Plavix) 75 mg : take one at Breakfast  Patient is due for next adherence  delivery on: 08/19/21. Called patient and reviewed medications and coordinated delivery. Confirmed Packaging for 30 DS  This delivery to include: Duloxetine (Cymbalta) 30 mg: take one at Breakfast. Ezetimibe (Zetia) 10 mg: take on at Breakfast Atorvastatin (Lipitor)  80 mg: take one at Breakfast Metoprolol Succ.(Toprolol)  50 mg : take one at Breakfast Pantoprazole (Protonix) 40 mg: take one at Breakfast Clopidogrel (Plavix) 75 mg : take one at Breakfast   Confirmed delivery date of 08/19/21, advised patient that pharmacy will contact them the morning of delivery.    Care Gaps: COVID Booster #3 [name]) - Overdue CCM - 12/22 AWV - 3/22 BP - 129/82 (06/09/21)  Star Rating Drugs: Atorvastatin 80mg  - Last filled 07/11/21 30 DS at [facility]  Patient Assistance: None  [place] Clinical Pharmacist Assistant [phone]

## 2021-09-02 ENCOUNTER — Ambulatory Visit (INDEPENDENT_AMBULATORY_CARE_PROVIDER_SITE_OTHER): Payer: Medicare HMO

## 2021-09-02 VITALS — BP 130/78 | HR 65 | Temp 97.6°F | Ht 67.5 in | Wt 182.2 lb

## 2021-09-02 DIAGNOSIS — Z0000 Encounter for general adult medical examination without abnormal findings: Secondary | ICD-10-CM | POA: Diagnosis not present

## 2021-09-02 NOTE — Patient Instructions (Signed)
Derrick Stokes , Thank you for taking time to come for your Medicare Wellness Visit. I appreciate your ongoing commitment to your health goals. Please review the following plan we discussed and let me know if I can assist you in the future.   Screening recommendations/referrals: Colonoscopy: completed 05/08/2020 Recommended yearly ophthalmology/optometry visit for glaucoma screening and checkup Recommended yearly dental visit for hygiene and checkup  Vaccinations: Influenza vaccine: completed 03/31/2021, due next flu season Pneumococcal vaccine: completed 06/09/2021 Tdap vaccine: completed 05/01/2014, due 05/01/2024 Shingles vaccine: discussed   Covid-19:  09/12/2019, 08/15/2019  Advanced directives: Advance directive discussed with you today. Even though you declined this today please call our office should you change your mind and we can give you the proper paperwork for you to fill out.  Conditions/risks identified: smoking  Next appointment: Follow up in one year for your annual wellness visit.   Preventive Care 9 Years and Older, Male Preventive care refers to lifestyle choices and visits with your health care provider that can promote health and wellness. What does preventive care include? A yearly physical exam. This is also called an annual well check. Dental exams once or twice a year. Routine eye exams. Ask your health care provider how often you should have your eyes checked. Personal lifestyle choices, including: Daily care of your teeth and gums. Regular physical activity. Eating a healthy diet. Avoiding tobacco and drug use. Limiting alcohol use. Practicing safe sex. Taking low doses of aspirin every day. Taking vitamin and mineral supplements as recommended by your health care provider. What happens during an annual well check? The services and screenings done by your health care provider during your annual well check will depend on your age, overall health, lifestyle  risk factors, and family history of disease. Counseling  Your health care provider may ask you questions about your: Alcohol use. Tobacco use. Drug use. Emotional well-being. Home and relationship well-being. Sexual activity. Eating habits. History of falls. Memory and ability to understand (cognition). Work and work Statistician. Screening  You may have the following tests or measurements: Height, weight, and BMI. Blood pressure. Lipid and cholesterol levels. These may be checked every 5 years, or more frequently if you are over 67 years old. Skin check. Lung cancer screening. You may have this screening every year starting at age 39 if you have a 30-pack-year history of smoking and currently smoke or have quit within the past 15 years. Fecal occult blood test (FOBT) of the stool. You may have this test every year starting at age 63. Flexible sigmoidoscopy or colonoscopy. You may have a sigmoidoscopy every 5 years or a colonoscopy every 10 years starting at age 22. Prostate cancer screening. Recommendations will vary depending on your family history and other risks. Hepatitis C blood test. Hepatitis B blood test. Sexually transmitted disease (STD) testing. Diabetes screening. This is done by checking your blood sugar (glucose) after you have not eaten for a while (fasting). You may have this done every 1-3 years. Abdominal aortic aneurysm (AAA) screening. You may need this if you are a current or former smoker. Osteoporosis. You may be screened starting at age 9 if you are at high risk. Talk with your health care provider about your test results, treatment options, and if necessary, the need for more tests. Vaccines  Your health care provider may recommend certain vaccines, such as: Influenza vaccine. This is recommended every year. Tetanus, diphtheria, and acellular pertussis (Tdap, Td) vaccine. You may need a Td booster every 10  years. Zoster vaccine. You may need this after age  52. Pneumococcal 13-valent conjugate (PCV13) vaccine. One dose is recommended after age 46. Pneumococcal polysaccharide (PPSV23) vaccine. One dose is recommended after age 53. Talk to your health care provider about which screenings and vaccines you need and how often you need them. This information is not intended to replace advice given to you by your health care provider. Make sure you discuss any questions you have with your health care provider. Document Released: 08/02/2015 Document Revised: 03/25/2016 Document Reviewed: 05/07/2015 Elsevier Interactive Patient Education  2017 [employer] Prevention in the Home Falls can cause injuries. They can happen to people of all ages. There are many things you can do to make your home safe and to help prevent falls. What can I do on the outside of my home? Regularly fix the edges of walkways and driveways and fix any cracks. Remove anything that might make you trip as you walk through a door, such as a raised step or threshold. Trim any bushes or trees on the path to your home. Use bright outdoor lighting. Clear any walking paths of anything that might make someone trip, such as rocks or tools. Regularly check to see if handrails are loose or broken. Make sure that both sides of any steps have handrails. Any raised decks and porches should have guardrails on the edges. Have any leaves, snow, or ice cleared regularly. Use sand or salt on walking paths during winter. Clean up any spills in your garage right away. This includes oil or grease spills. What can I do in the bathroom? Use night lights. Install grab bars by the toilet and in the tub and shower. Do not use towel bars as grab bars. Use non-skid mats or decals in the tub or shower. If you need to sit down in the shower, use a plastic, non-slip stool. Keep the floor dry. Clean up any water that spills on the floor as soon as it happens. Remove soap buildup in the tub or shower  regularly. Attach bath mats securely with double-sided non-slip rug tape. Do not have throw rugs and other things on the floor that can make you trip. What can I do in the bedroom? Use night lights. Make sure that you have a light by your bed that is easy to reach. Do not use any sheets or blankets that are too big for your bed. They should not hang down onto the floor. Have a firm chair that has side arms. You can use this for support while you get dressed. Do not have throw rugs and other things on the floor that can make you trip. What can I do in the kitchen? Clean up any spills right away. Avoid walking on wet floors. Keep items that you use a lot in easy-to-reach places. If you need to reach something above you, use a strong step stool that has a grab bar. Keep electrical cords out of the way. Do not use floor polish or wax that makes floors slippery. If you must use wax, use non-skid floor wax. Do not have throw rugs and other things on the floor that can make you trip. What can I do with my stairs? Do not leave any items on the stairs. Make sure that there are handrails on both sides of the stairs and use them. Fix handrails that are broken or loose. Make sure that handrails are as long as the stairways. Check any carpeting to make sure  that it is firmly attached to the stairs. Fix any carpet that is loose or worn. Avoid having throw rugs at the top or bottom of the stairs. If you do have throw rugs, attach them to the floor with carpet tape. Make sure that you have a light switch at the top of the stairs and the bottom of the stairs. If you do not have them, ask someone to add them for you. What else can I do to help prevent falls? Wear shoes that: Do not have high heels. Have rubber bottoms. Are comfortable and fit you well. Are closed at the toe. Do not wear sandals. If you use a stepladder: Make sure that it is fully opened. Do not climb a closed stepladder. Make sure that  both sides of the stepladder are locked into place. Ask someone to hold it for you, if possible. Clearly mark and make sure that you can see: Any grab bars or handrails. First and last steps. Where the edge of each step is. Use tools that help you move around (mobility aids) if they are needed. These include: Canes. Walkers. Scooters. Crutches. Turn on the lights when you go into a dark area. Replace any light bulbs as soon as they burn out. Set up your furniture so you have a clear path. Avoid moving your furniture around. If any of your floors are uneven, fix them. If there are any pets around you, be aware of where they are. Review your medicines with your doctor. Some medicines can make you feel dizzy. This can increase your chance of falling. Ask your doctor what other things that you can do to help prevent falls. This information is not intended to replace advice given to you by your health care provider. Make sure you discuss any questions you have with your health care provider. Document Released: 05/02/2009 Document Revised: 12/12/2015 Document Reviewed: 08/10/2014 Elsevier Interactive Patient Education  2017 [employer].

## 2021-09-02 NOTE — Addendum Note (Signed)
Addended by: Glenna Durand E on: 09/02/2021 12:03 PM   Modules accepted: Orders

## 2021-09-02 NOTE — Progress Notes (Signed)
This visit occurred during the SARS-CoV-2 public health emergency.  Safety protocols were in place, including screening questions prior to the visit, additional usage of staff PPE, and extensive cleaning of exam room while observing appropriate contact time as indicated for disinfecting solutions.  Subjective:   Derrick Stokes is a 71 y.o. male who presents for Medicare Annual/Subsequent preventive examination.  Review of Systems     Cardiac Risk Factors include: advanced age (>38men, >54 women);dyslipidemia;hypertension;male gender;smoking/ tobacco exposure     Objective:    Today's Vitals   09/02/21 0948 09/02/21 1017  BP: 130/78   Pulse: 65   Temp: 97.6 F (36.4 C)   TempSrc: Oral   SpO2: 97%   Weight: 182 lb 3.2 oz (82.6 kg)   Height: 5' 7.5" (1.715 m)   PainSc:  2    Body mass index is 28.12 kg/m.  Advanced Directives 09/02/2021 10/16/2020 05/08/2020 12/29/2018 03/16/2018 05/17/2016 10/19/2014  Does Patient Have a Medical Advance Directive? No No No No No No No  Would patient like information on creating a medical advance directive? No - Patient declined - No - Patient declined No - Patient declined No - Patient declined No - patient declined information No - patient declined information    Current Medications (verified) Outpatient Encounter Medications as of 09/02/2021  Medication Sig   aspirin EC 81 MG tablet Take 81 mg by mouth at bedtime.    atorvastatin (LIPITOR) 80 MG tablet Take 1 tablet (80 mg total) by mouth daily at 6 PM.   clopidogrel (PLAVIX) 75 MG tablet TAKE 1 TABLET BY MOUTH EVERY DAY WITH BREAKFAST   DULoxetine (CYMBALTA) 30 MG capsule Take 1 capsule (30 mg total) by mouth daily.   ezetimibe (ZETIA) 10 MG tablet TAKE ONE TABLET BY MOUTH ONCE DAILY   metoprolol succinate (TOPROL-XL) 50 MG 24 hr tablet Take 1 tablet (50 mg total) by mouth daily. Take with or immediately following a meal.   nitroGLYCERIN (NITROSTAT) 0.4 MG SL tablet PLACE 1 TABLET UNDER THE  TONGUE EVERY 5 MINUTES X 3 DOSES AS NEEDED FOR CHEST PAIN. (Patient taking differently: Place 0.4 mg under the tongue every 5 (five) minutes as needed for chest pain.)   Omega-3 Fatty Acids (FISH OIL) 1000 MG CPDR Take 1,000 mg by mouth daily.    ondansetron (ZOFRAN ODT) 8 MG disintegrating tablet Take 1 tablet (8 mg total) by mouth every 8 (eight) hours as needed for nausea or vomiting.   pantoprazole (PROTONIX) 40 MG tablet Take 1 tablet (40 mg total) by mouth daily.   vitamin B-12 (CYANOCOBALAMIN) 1000 MCG tablet Take 1,000 mcg by mouth daily.    No facility-administered encounter medications on file as of 09/02/2021.    Allergies (verified) Iodine, Iohexol, Shrimp [shellfish allergy], and Isosorbide dinitrate   History: Past Medical History:  Diagnosis Date   Arthritis    B12 deficiency    Borderline   CAD (coronary artery disease)    a. s/p CABG 01/2011;  b. ETT Myoview 3/14:  Low risk;  c. 09/2014 Cath/PCI: LM nl, LAD 100p, LCX 20m/OM2 60-70p (2.75x28 Synergy DES), RCA dom, 61m (atherectomy, 3.5x24 Synergy DES), PDA nl, LIMA->LAD nl, VG->Diag nl, VG->OM 100, VG->RCA 100.   Cataract    bilateral sx    COPD (chronic obstructive pulmonary disease) (HCC)    GERD (gastroesophageal reflux disease)    on meds   Hiatal hernia    History of echocardiogram    Echo 10/16: EF 50-55%, no RWMA, Gr 1  DD, mild MR, mild TR   History of kidney stones    Passed   HLD (hyperlipidemia)    on meds   HOH (hard of hearing)    Hypertension    on meds   Leg pain    ABIs 4/14:  R 1.2, L 1.2, TBIs normal   Peripheral neuropathy    From trauma   Reactive airway disease 03/18/2018   Tobacco abuse    Past Surgical History:  Procedure Laterality Date   COLONOSCOPY  03/09/2020   Neg   COLONOSCOPY WITH PROPOFOL N/A 05/08/2020   Procedure: COLONOSCOPY WITH PROPOFOL;  Surgeon: Irving Copas., MD;  Location: [facility];  Service: Gastroenterology;  Laterality: N/A;   CORONARY ARTERY BYPASS  GRAFT  2013   x5   ENDOSCOPIC MUCOSAL RESECTION N/A 05/08/2020   Procedure: ENDOSCOPIC MUCOSAL RESECTION;  Surgeon: Rush Landmark Telford Nab., MD;  Location: [facility];  Service: Gastroenterology;  Laterality: N/A;   EPIDURAL STEROIDS     EYE SURGERY Bilateral    Cataract   HEMOSTASIS CLIP PLACEMENT  05/08/2020   Procedure: HEMOSTASIS CLIP PLACEMENT;  Surgeon: Irving Copas., MD;  Location: [facility];  Service: Gastroenterology;;   HEMOSTASIS CONTROL  05/08/2020   Procedure: HEMOSTASIS CONTROL;  Surgeon: Irving Copas., MD;  Location: [facility];  Service: Gastroenterology;;   INTRAVASCULAR PRESSURE WIRE/FFR STUDY N/A 03/17/2018   Procedure: INTRAVASCULAR PRESSURE WIRE/FFR STUDY;  Surgeon: Lorretta Harp, MD;  Location: [facility];  Service: Cardiovascular;  Laterality: N/A;   LEFT HEART CATH AND CORONARY ANGIOGRAPHY N/A 03/17/2018   Procedure: LEFT HEART CATH AND CORONARY ANGIOGRAPHY;  Surgeon: Lorretta Harp, MD;  Location: [place] [facility];  Service: Cardiovascular;  Laterality: N/A;   LEFT HEART CATH AND CORS/GRAFTS ANGIOGRAPHY N/A 10/16/2020   Procedure: LEFT HEART CATH AND CORS/GRAFTS ANGIOGRAPHY;  Surgeon: Sherren Mocha, MD;  Location: [facility];  Service: Cardiovascular;  Laterality: N/A;   LEFT HEART CATHETERIZATION WITH CORONARY/GRAFT ANGIOGRAM N/A 09/18/2014   Procedure: LEFT HEART CATHETERIZATION WITH [name];  Surgeon: [name], MD;  Location: [facility] CATH LAB;  Service: Cardiovascular;  Laterality: N/A;   PERCUTANEOUS CORONARY ROTOBLATOR INTERVENTION (PCI-R) N/A 09/20/2014   Procedure: PERCUTANEOUS CORONARY ROTOBLATOR INTERVENTION (PCI-R);  Surgeon: [name], MD;  Location: [facility] CATH LAB;  Service: Cardiovascular;  Laterality: N/A;   POLYPECTOMY  02/28/2020   6 polyps/tics/hems   POLYPECTOMY  05/08/2020   Procedure: POLYPECTOMY;  Surgeon: Mansouraty, Telford Nab., MD;  Location: [facility];  Service:  Gastroenterology;;   Clide Deutscher  05/08/2020   Procedure: Clide Deutscher;  Surgeon: Mansouraty, Telford Nab., MD;  Location: [facility];  Service: Gastroenterology;;   SUBMUCOSAL LIFTING INJECTION  05/08/2020   Procedure: SUBMUCOSAL LIFTING INJECTION;  Surgeon: Irving Copas., MD;  Location: [facility] ENDOSCOPY;  Service: Gastroenterology;;   Family History  Problem Relation Age of Onset   Diabetes Father    Heart attack Father 10   Heart disease Father        CAD   Cancer Mother    Diabetes Brother    Diabetes Brother    Colon cancer Neg Hx    Esophageal cancer Neg Hx    Stomach cancer Neg Hx    Pancreatic cancer Neg Hx    Inflammatory bowel disease Neg Hx    Liver disease Neg Hx    Rectal cancer Neg Hx    Colon polyps Neg Hx    Social History   Socioeconomic History   Marital  status: Married    Spouse name: Not on file   Number of children: Not on file   Years of education: Not on file   Highest education level: Not on file  Occupational History   Occupation: [employer]  Tobacco Use   Smoking status: Every Day    Packs/day: 1.00    Years: 51.00    Pack years: 51.00    Types: Cigarettes    Start date: 32    Passive exposure: Past   Smokeless tobacco: Never  Vaping Use   Vaping Use: Never used  Substance and Sexual Activity   Alcohol use: Yes    Alcohol/week: 3.0 standard drinks    Types: 3 Cans of beer per week   Drug use: No   Sexual activity: Yes  Other Topics Concern   Not on file  Social History Narrative   Married   Gets regular exercise - walks 1/4 mile per day 5 times a week without symptoms   Social Determinants of Radio broadcast assistant Strain: Low Risk    Difficulty of Paying Living Expenses: Not hard at all  Food Insecurity: No Food Insecurity   Worried About Charity fundraiser in the Last Year: Never true   Arboriculturist in the Last Year: Never true  Transportation Needs: No Transportation Needs   Lack of Transportation  (Medical): No   Lack of Transportation (Non-Medical): No  Physical Activity: Inactive   Days of Exercise per Week: 0 days   Minutes of Exercise per Session: 0 min  Stress: Stress Concern Present   Feeling of Stress : Rather much  Social Connections: Not on file    Tobacco Counseling Ready to quit: Not Answered Counseling given: Not Answered   Clinical Intake:  Pre-visit preparation completed: Yes  Pain : 0-10 Pain Score: 2  Pain Type: Chronic pain Pain Location: Back Pain Orientation: Lower Pain Descriptors / Indicators: Aching Pain Onset: More than a month ago Pain Frequency: Constant     Nutritional Status: BMI 25 -29 Overweight Nutritional Risks: None Diabetes: No  How often do you need to have someone help you when you read instructions, pamphlets, or other written materials from your doctor or pharmacy?: 1 - Never What is the last grade level you completed in school?: 11th grade  Diabetic? no  Interpreter Needed?: No  Information entered by :: NAllen LPN   Activities of Daily Living In your present state of health, do you have any difficulty performing the following activities: 09/02/2021 08/31/2021  Hearing? Y Y  Comment - -  Vision? N N  Difficulty concentrating or making decisions? N N  Walking or climbing stairs? N N  Dressing or bathing? N N  Doing errands, shopping? Y N  Comment does not drive Facilities manager and eating ? N N  Using the Toilet? N N  In the past six months, have you accidently leaked urine? N N  Do you have problems with loss of bowel control? Y Y  Managing your Medications? Y N  Comment wife manages -  Managing your Finances? N N  Housekeeping or managing your Housekeeping? N N  Some recent data might be hidden    Patient Care Team: [name], [name], MD as PCP - General (Family Medicine) Sherren Mocha, MD as PCP - Cardiology (Cardiology) Viona Gilmore, [facility] as Pharmacist (Pharmacist)  Indicate any recent Medical  Services you may have received from other than [facility] providers in the past  year (date may be approximate).     Assessment:   This is a routine wellness examination for [place].  Hearing/Vision screen Vision Screening - Comments:: Regular eye exams, Dr. Valetta Close  Dietary issues and exercise activities discussed: Current Exercise Habits: The patient has a physically strenuous job, but has no regular exercise apart from work.   Goals Addressed             This Visit's Progress    Patient Stated       09/02/2021, no goals       Depression Screen PHQ 2/9 Scores 09/02/2021 06/09/2021 09/20/2020 09/18/2019  PHQ - 2 Score 0 0 0 0    Fall Risk Fall Risk  09/02/2021 08/31/2021 06/09/2021 09/20/2020  Falls in the past year? 0 0 0 0  Number falls in past yr: - 0 - 0  Injury with Fall? - 0 - 0  Risk for fall due to : Medication side effect - - -  Follow up Education provided;Falls evaluation completed;Falls prevention discussed - - Education provided    FALL RISK PREVENTION PERTAINING TO THE HOME:  Any stairs in or around the home? Yes  If so, are there any without handrails? Yes  Home free of loose throw rugs in walkways, pet beds, electrical cords, etc? Yes  Adequate lighting in your home to reduce risk of falls? Yes   ASSISTIVE DEVICES UTILIZED TO PREVENT FALLS:  Life alert? No  Use of a cane, walker or w/c? No  Grab bars in the bathroom? No  Shower chair or bench in shower? No  Elevated toilet seat or a handicapped toilet? No   TIMED UP AND GO:  Was the test performed? No .    Gait steady and fast without use of assistive device  Cognitive Function:     6CIT Screen 09/02/2021  What Year? 0 points  What month? 0 points  What time? 0 points  Count back from 20 0 points  Months in reverse 4 points  Repeat phrase 10 points  Total Score 14    Immunizations Immunization History  Administered Date(s) Administered   Fluad Quad(high Dose 65+) 03/31/2021   Influenza, High  Dose Seasonal PF 09/14/2018, 04/25/2019, 04/23/2020   Influenza,inj,Quad PF,6+ Mos 04/17/2014, 05/06/2015, 06/08/2016   Influenza,inj,Quad PF,6-35 Mos 04/25/2019   Moderna Sars-Covid-2 Vaccination 08/15/2019, 09/12/2019   Pneumococcal Conjugate-13 09/14/2018   Pneumococcal Polysaccharide-23 06/08/2016, 06/09/2021   Tdap 05/01/2014   Tetanus 04/17/2014   Zoster, Live 05/01/2014    TDAP status: Up to date  Flu Vaccine status: Up to date  Pneumococcal vaccine status: Up to date  Covid-19 vaccine status: Completed vaccines  Qualifies for Shingles Vaccine? Yes   Zostavax completed Yes   Shingrix Completed?: No.    Education has been provided regarding the importance of this vaccine. Patient has been advised to call insurance company to determine out of pocket expense if they have not yet received this vaccine. Advised may also receive vaccine at local pharmacy or Health Dept. Verbalized acceptance and understanding.  Screening Tests Health Maintenance  Topic Date Due   COVID-19 Vaccine (3 - Booster for Moderna series) 11/07/2019   Zoster Vaccines- Shingrix (1 of 2) 07/25/2026 (Originally 09/20/2002)   URINE MICROALBUMIN  03/31/2022   TETANUS/TDAP  05/01/2024   COLONOSCOPY (Pts 45-52yrs Insurance coverage will need to be confirmed)  05/08/2030   Pneumonia Vaccine 26+ Years old  Completed   INFLUENZA VACCINE  Completed   Hepatitis C Screening  Completed   HPV  [place] Maintenance  Health Maintenance Due  Topic Date Due   COVID-19 Vaccine (3 - Booster for Moderna series) 11/07/2019    Colorectal cancer screening: Type of screening: Colonoscopy. Completed 05/08/2020. Repeat every 10 years  Lung Cancer Screening: (Low Dose CT Chest recommended if Age 47-80 years, 30 pack-year currently smoking OR have quit w/in 15years.) does qualify.   Lung Cancer Screening Referral: declined at this time  Additional Screening:  Hepatitis C Screening: does qualify;  Completed 09/14/2018  Vision Screening: Recommended annual ophthalmology exams for early detection of glaucoma and other disorders of the eye. Is the patient up to date with their annual eye exam?  Yes  Who is the provider or what is the name of the office in which the patient attends annual eye exams? Dr. Valetta Close If pt is not established with a provider, would they like to be referred to a provider to establish care? No .   Dental Screening: Recommended annual dental exams for proper oral hygiene  Community Resource Referral / Chronic Care Management: CRR required this visit?  No   CCM required this visit?  No      Plan:     I have personally reviewed and noted the following in the patients chart:   Medical and social history Use of alcohol, tobacco or illicit drugs  Current medications and supplements including opioid prescriptions. Patient is not currently taking opioid prescriptions. Functional ability and status Nutritional status Physical activity Advanced directives List of other physicians Hospitalizations, surgeries, and ER visits in previous 12 months Vitals Screenings to include cognitive, depression, and falls Referrals and appointments  In addition, I have reviewed and discussed with patient certain preventive protocols, quality metrics, and best practice recommendations. A written personalized care plan for preventive services as well as general preventive health recommendations were provided to patient.     [name], LPN   3/54/5625   Nurse Notes: none

## 2021-09-03 ENCOUNTER — Telehealth: Payer: Self-pay

## 2021-09-03 NOTE — Telephone Encounter (Signed)
° °  Telephone encounter was:  Successful.  09/03/2021 Name: Derrick Stokes MRN: [id number] DOB: [birth year]  [name] is a 71 y.o. year old male who is a primary care patient of [name], Malka So, MD . The community resource team was consulted for assistance with  hearing aids  Care guide performed the following interventions: Spoke with patient and his wife about [employer] for the Deaf and Hard of Hearing.  Verified email address [email or web address] and sent information.  Letter saved in Epic.  Follow Up Plan:  Care guide will follow up with patient by phone over the next 3-23 days  Briget Shaheed, AAS Paralegal, [place] [facility]  [address], [place] [address] ??[email or web address]   ?? [phone]   [email or web address]

## 2021-09-05 ENCOUNTER — Other Ambulatory Visit: Payer: Self-pay | Admitting: Family Medicine

## 2021-09-08 ENCOUNTER — Telehealth: Payer: Self-pay | Admitting: Pharmacist

## 2021-09-08 NOTE — Chronic Care Management (AMB) (Addendum)
Chronic Care Management Pharmacy Assistant   Name: Derrick Stokes  MRN: [id number] DOB: 1951/05/15  Reason for Encounter: Medication Review Medication Coordination    Conditions to be addressed/monitored: HTN  Recent office visits:  09/08/21 Derrick Simmering, LPN - Patient presented for [facility] Annual Wellness exam and other concerns. No medication changes.  Recent consult visits:  None  Hospital visits:  None in previous 6 months  Medications: Outpatient Encounter Medications as of 09/08/2021  Medication Sig   aspirin EC 81 MG tablet Take 81 mg by mouth at bedtime.    atorvastatin (LIPITOR) 80 MG tablet Take 1 tablet (80 mg total) by mouth daily at 6 PM.   clopidogrel (PLAVIX) 75 MG tablet TAKE 1 TABLET BY MOUTH EVERY DAY WITH BREAKFAST   DULoxetine (CYMBALTA) 30 MG capsule Take 1 capsule (30 mg total) by mouth daily.   ezetimibe (ZETIA) 10 MG tablet TAKE ONE TABLET BY MOUTH ONCE DAILY   metoprolol succinate (TOPROL-XL) 50 MG 24 hr tablet Take 1 tablet (50 mg total) by mouth daily. Take with or immediately following a meal.   nitroGLYCERIN (NITROSTAT) 0.4 MG SL tablet PLACE 1 TABLET UNDER THE TONGUE EVERY 5 MINUTES X 3 DOSES AS NEEDED FOR CHEST PAIN. (Patient taking differently: Place 0.4 mg under the tongue every 5 (five) minutes as needed for chest pain.)   Omega-3 Fatty Acids (FISH OIL) 1000 MG CPDR Take 1,000 mg by mouth daily.    ondansetron (ZOFRAN ODT) 8 MG disintegrating tablet Take 1 tablet (8 mg total) by mouth every 8 (eight) hours as needed for nausea or vomiting.   pantoprazole (PROTONIX) 40 MG tablet Take 1 tablet (40 mg total) by mouth daily.   vitamin B-12 (CYANOCOBALAMIN) 1000 MCG tablet Take 1,000 mcg by mouth daily.    No facility-administered encounter medications on file as of 09/08/2021.  Reviewed chart for medication changes ahead of medication coordination call.  No OVs, Consults, or hospital visits since last care coordination call/Pharmacist visit.    No medication changes indicated   BP Readings from Last 3 Encounters:  09/02/21 130/78  07/16/21 122/90  06/09/21 (!) 128/92    Lab Results  Component Value Date   HGBA1C 5.6 03/31/2021   Reviewed chart prior to disease state call. Spoke with patient regarding BP  Recent Office Vitals: BP Readings from Last 3 Encounters:  09/02/21 130/78  07/16/21 122/90  06/09/21 (!) 128/92   Pulse Readings from Last 3 Encounters:  09/02/21 65  06/09/21 (!) 52  03/31/21 63    Wt Readings from Last 3 Encounters:  09/02/21 182 lb 3.2 oz (82.6 kg)  06/09/21 182 lb (82.6 kg)  03/31/21 182 lb 6 oz (82.7 kg)     Kidney Function Lab Results  Component Value Date/Time   CREATININE 0.80 03/31/2021 09:30 AM   CREATININE 0.81 10/07/2020 09:30 AM   CREATININE 0.84 02/05/2020 02:17 PM   GFR 90.92 03/31/2021 09:30 AM   GFRNONAA 95 03/08/2019 09:05 AM   GFRAA 109 03/08/2019 09:05 AM    BMP Latest Ref Rng & Units 03/31/2021 10/07/2020 09/20/2020  Glucose 70 - 99 mg/dL 85 105(H) 83  BUN 6 - 23 mg/dL 9 12 15   Creatinine 0.40 - 1.50 mg/dL 0.80 0.81 0.79  BUN/Creat Ratio 10 - 24 - 15 -  Sodium 135 - 145 mEq/L 137 136 139  Potassium 3.5 - 5.1 mEq/L 4.8 4.9 4.5  Chloride 96 - 112 mEq/L 99 98 103  CO2 19 - 32 mEq/L  28 21 27   Calcium 8.4 - 10.5 mg/dL 9.8 9.6 9.4    Current antihypertensive regimen:  None  Current home BP readings:  BP Readings from Last 3 Encounters:  09/02/21 130/78  07/16/21 122/90  06/09/21 (!) 128/92      Patient obtains medications through Adherence Packaging  30 Days   Last adherence delivery included:  Duloxetine (Cymbalta) 30 mg: take one capsule at Breakfast. Ezetimibe (Zetia) 10 mg: take one tablet at Breakfast Atorvastatin (Lipitor)  80 mg: take one tablet at Breakfast Metoprolol Succ.(Toprolol)  50 mg : take one tablet at Breakfast Pantoprazole (Protonix) 40 mg: take one tablet at Breakfast Clopidogrel (Plavix) 75 mg : take one tablet at  Breakfast  Patient is due for next adherence delivery on: 09/18/21. Called patient and reviewed medications and coordinated delivery. Confirmed Packaging for 30 DS   This delivery to include: Ezetimibe (Zetia) 10 mg: take one tablet at breakfast Atorvastatin (Lipitor)  80 mg: take one tablet at breakfast Duloxetine (Cymbalta) 30 mg: take one capsule at breakfast Pantoprazole (Protonix) 40 mg: take one tablet at breakfast Clopidogrel (Plavix) 75 mg : take one tablet at breakfast Metoprolol Succinate (Toprol)  50 mg : take one tablet at breakfast  (Filled at [facility]) AMOXICILLIN 500 MG CAPSULE 09/04/2020 7    Confirmed delivery date of 09/18/21, advised patient that pharmacy will contact them the morning of delivery.    Notes: Wife reports that she has the number to scheduled lung CT screening but husband has not ben agreeable to going she will continue to work on him about going and schedule when he agrees.  Care Gaps: COVID Booster - Overdue BP- 130/78 (09/02/21)  AWV- 2/23 CCM - 8/23  Star Rating Drugs: None  Ned Clines [place] Clinical Pharmacist Assistant [phone]

## 2021-09-16 ENCOUNTER — Telehealth: Payer: Self-pay

## 2021-09-16 NOTE — Telephone Encounter (Signed)
° °  Telephone encounter was:  Unsuccessful.  09/16/2021 Name: Derrick Stokes MRN: [id number] DOB: 1951-05-24  Unsuccessful outbound call made today to assist with:   hearing aids  Outreach Attempt:  2nd Attempt  A HIPAA compliant voice message was left requesting a return call.  Instructed patient to call back at [phone]. Left message on voicemail to confirm patient's wife received information emailed for the [facility] for the Deaf and Hard of Hearing Program.  Tarek Cravens, AAS Paralegal, [place] [facility]  [address], [place] [address] ??[email or web address]   ?? [phone]   [email or web address]

## 2021-09-18 ENCOUNTER — Telehealth: Payer: Self-pay

## 2021-09-18 NOTE — Telephone Encounter (Signed)
? ?  Telephone encounter was:  Successful.  ?09/18/2021 ?Name: Derrick Stokes MRN: [id number] DOB: 05-07-1951 ? ?Derrick Stokes is a 71 y.o. year old male who is a primary care patient of [name], Malka So, MD . The community resource team was consulted for assistance with Financial Difficulties related to hearing aids ? ?Care guide performed the following interventions: Spoke with patient's spouse she has received the information sent for the [facility] for the [facility] and Hard of Hearing.  She has been busy and unable to contact them but plans on calling in a few days.  ? ?Follow Up Plan:  No further follow up planned at this time. The patient has been provided with needed resources. ? ?Aime Carreras, [place], [facility] ?Care Guide  Embedded Care Coordination ?[place]  Care Management  ?[address] ?[place], [place] [address] ?[email or web address]  ?? [phone]   ?[email or web address] ?  ?

## 2021-10-06 ENCOUNTER — Other Ambulatory Visit: Payer: Self-pay | Admitting: Cardiovascular Disease

## 2021-10-07 ENCOUNTER — Telehealth: Payer: Self-pay | Admitting: Pharmacist

## 2021-10-07 NOTE — Progress Notes (Signed)
? ? ?  Chronic Care Management ?Pharmacy Assistant  ? ?Name: Derrick Stokes  MRN: [id number] DOB: 17-Jan-1951 ? ?Reason for Encounter: Medication Review Medication Coordination  ?  ?Recent office visits:  ?None ? ?Recent consult visits:  ?None ? ?Hospital visits:  ?None in previous 6 months ? ?Medications: ?Outpatient Encounter Medications as of 10/07/2021  ?Medication Sig  ? aspirin EC 81 MG tablet Take 81 mg by mouth at bedtime.   ? atorvastatin (LIPITOR) 80 MG tablet Take 1 tablet (80 mg total) by mouth daily at 6 PM.  ? clopidogrel (PLAVIX) 75 MG tablet TAKE 1 TABLET BY MOUTH EVERY DAY WITH BREAKFAST  ? DULoxetine (CYMBALTA) 30 MG capsule Take 1 capsule (30 mg total) by mouth daily.  ? ezetimibe (ZETIA) 10 MG tablet TAKE ONE TABLET BY MOUTH ONCE DAILY  ? metoprolol succinate (TOPROL-XL) 50 MG 24 hr tablet Take 1 tablet (50 mg total) by mouth daily. Take with or immediately following a meal.  ? nitroGLYCERIN (NITROSTAT) 0.4 MG SL tablet PLACE 1 TABLET UNDER THE TONGUE EVERY 5 MINUTES X 3 DOSES AS NEEDED FOR CHEST PAIN. (Patient taking differently: Place 0.4 mg under the tongue every 5 (five) minutes as needed for chest pain.)  ? Omega-3 Fatty Acids (FISH OIL) 1000 MG CPDR Take 1,000 mg by mouth daily.   ? ondansetron (ZOFRAN ODT) 8 MG disintegrating tablet Take 1 tablet (8 mg total) by mouth every 8 (eight) hours as needed for nausea or vomiting.  ? pantoprazole (PROTONIX) 40 MG tablet Take 1 tablet (40 mg total) by mouth daily.  ? vitamin B-12 (CYANOCOBALAMIN) 1000 MCG tablet Take 1,000 mcg by mouth daily.   ? ?No facility-administered encounter medications on file as of 10/07/2021.  ?Reviewed chart for medication changes ahead of medication coordination call. ? ?No OVs, Consults, or hospital visits since last care coordination call/Pharmacist visit.  ? ?No medication changes indicated. ?BP Readings from Last 3 Encounters:  ?09/02/21 130/78  ?07/16/21 122/90  ?06/09/21 (!) 128/92  ?  ?Lab Results  ?Component Value  Date  ? HGBA1C 5.6 03/31/2021  ?  ? ?Patient obtains medications through Adherence Packaging  30 Days  ? ?Last adherence delivery included: ?Ezetimibe (Zetia) 10 mg: take one tablet at breakfast ?Atorvastatin (Lipitor)  80 mg: take one tablet at breakfast ?Duloxetine (Cymbalta) 30 mg: take one capsule at breakfast ?Pantoprazole (Protonix) 40 mg: take one tablet at breakfast ?Clopidogrel (Plavix) 75 mg : take one tablet at breakfast ?Metoprolol Succinate (Toprol)  50 mg : take one tablet at breakfast ? ?Patient is due for next adherence delivery on: 10/17/21. ?Called patient and reviewed medications and coordinated delivery. ?Confirmed Packs for 30 DS ? ?This delivery to include: ?Ezetimibe (Zetia) 10 mg: take one tablet at breakfast ?Atorvastatin (Lipitor)  80 mg: take one tablet at breakfast ?Duloxetine (Cymbalta) 30 mg: take one capsule at breakfast ?Pantoprazole (Protonix) 40 mg: take one tablet at breakfast ?Clopidogrel (Plavix) 75 mg : take one tablet at breakfast ?Metoprolol Succinate (Toprol)  50 mg : take one tablet at breakfast ? ? ? ?Wife Confirmed delivery date of 10/17/21, advised patient that pharmacy will contact them the morning of delivery.  ? ?Care Gaps: ?COVID Booster - Overdue ?CCM- 8/23 ?AWV- 2/23 ?BP- 128/92 ( 06/09/21) ? ?Star Rating Drugs: ?Atorvastatin 80 mg - Last filled 09/11/21 30 DS at [facility] ? ? ?Ned Clines CMA ?Clinical Pharmacist Assistant ?[phone] ? ?

## 2021-11-05 ENCOUNTER — Other Ambulatory Visit: Payer: Self-pay | Admitting: Cardiovascular Disease

## 2021-11-05 DIAGNOSIS — E785 Hyperlipidemia, unspecified: Secondary | ICD-10-CM

## 2021-11-06 ENCOUNTER — Telehealth: Payer: Self-pay | Admitting: Pharmacist

## 2021-11-06 NOTE — Chronic Care Management (AMB) (Signed)
? ? ?Chronic Care Management ?Pharmacy Assistant  ? ?Name: Derrick Stokes  MRN: [id number] DOB: 07-27-1950 ? ? ?Reason for Encounter: Medication Review / Medication Coordination ?  ?Conditions to be addressed/monitored: ?HTN ? ?Recent office visits:  ?None ? ?Recent consult visits:  ?None ? ?Hospital visits:  ?None in previous 6 months ? ?Medications: ?Outpatient Encounter Medications as of 11/06/2021  ?Medication Sig  ? aspirin EC 81 MG tablet Take 81 mg by mouth at bedtime.   ? atorvastatin (LIPITOR) 80 MG tablet TAKE ONE TABLET BY MOUTH EVERY MORNING  ? clopidogrel (PLAVIX) 75 MG tablet TAKE ONE TABLET BY MOUTH EVERY MORNING  ? DULoxetine (CYMBALTA) 30 MG capsule Take 1 capsule (30 mg total) by mouth daily.  ? ezetimibe (ZETIA) 10 MG tablet TAKE ONE TABLET BY MOUTH ONCE DAILY  ? metoprolol succinate (TOPROL-XL) 50 MG 24 hr tablet TAKE ONE TABLET BY MOUTH ONCE DAILY WITH A MEAL  ? nitroGLYCERIN (NITROSTAT) 0.4 MG SL tablet PLACE 1 TABLET UNDER THE TONGUE EVERY 5 MINUTES X 3 DOSES AS NEEDED FOR CHEST PAIN. (Patient taking differently: Place 0.4 mg under the tongue every 5 (five) minutes as needed for chest pain.)  ? Omega-3 Fatty Acids (FISH OIL) 1000 MG CPDR Take 1,000 mg by mouth daily.   ? ondansetron (ZOFRAN ODT) 8 MG disintegrating tablet Take 1 tablet (8 mg total) by mouth every 8 (eight) hours as needed for nausea or vomiting.  ? pantoprazole (PROTONIX) 40 MG tablet TAKE ONE TABLET BY MOUTH ONCE DAILY  ? vitamin B-12 (CYANOCOBALAMIN) 1000 MCG tablet Take 1,000 mcg by mouth daily.   ? ?No facility-administered encounter medications on file as of 11/06/2021.  ?Reviewed chart prior to disease state call. Spoke with patient regarding BP ? ?Recent Office Vitals: ?BP Readings from Last 3 Encounters:  ?09/02/21 130/78  ?07/16/21 122/90  ?06/09/21 (!) 128/92  ? ?Pulse Readings from Last 3 Encounters:  ?09/02/21 65  ?06/09/21 (!) 52  ?03/31/21 63  ?  ?Wt Readings from Last 3 Encounters:  ?09/02/21 182 lb 3.2 oz (82.6  kg)  ?06/09/21 182 lb (82.6 kg)  ?03/31/21 182 lb 6 oz (82.7 kg)  ?  ? ?Kidney Function ?Lab Results  ?Component Value Date/Time  ? CREATININE 0.80 03/31/2021 09:30 AM  ? CREATININE 0.81 10/07/2020 09:30 AM  ? CREATININE 0.84 02/05/2020 02:17 PM  ? GFR 90.92 03/31/2021 09:30 AM  ? GFRNONAA 95 03/08/2019 09:05 AM  ? GFRAA 109 03/08/2019 09:05 AM  ? ? ? ?  Latest Ref Rng & Units 03/31/2021  ?  9:30 AM 10/07/2020  ?  9:30 AM 09/20/2020  ?  8:58 AM  ?BMP  ?Glucose 70 - 99 mg/dL 85   105   83    ?BUN 6 - 23 mg/dL '9   12   15    '$ ?Creatinine 0.40 - 1.50 mg/dL 0.80   0.81   0.79    ?BUN/Creat Ratio 10 - 24  15     ?Sodium 135 - 145 mEq/L 137   136   139    ?Potassium 3.5 - 5.1 mEq/L 4.8   4.9   4.5    ?Chloride 96 - 112 mEq/L 99   98   103    ?CO2 19 - 32 mEq/L '28   21   27    '$ ?Calcium 8.4 - 10.5 mg/dL 9.8   9.6   9.4    ? ? ?Current regimen:  ?Aspirin 81 mg 1 tablet daily ?Atorvastatin 80  mg 1 tablet daily ?Clopidogrel 75 mg 1 tablet daily with breakfast ?Nitroglycerin 0.4 mg SL as needed ?Metoprolol succinate 50 mg 1 tablet daily ?How often are you checking your Blood Pressure? weekly ?Current home BP readings: 130/78 ?What recent interventions/DTPs have been made by any provider to improve Blood Pressure control since last CPP Visit: Wife reports no changes she reports he has not complained of any hyper/hypotensive symptoms ?Any recent hospitalizations or ED visits since last visit with CPP? No ? ?Adherence Review: ?Is the patient currently on ACE/ARB medication? No ?Does the patient have >5 day gap between last estimated fill dates? No ? ? ?Reviewed chart for medication changes ahead of medication coordination call. ? ?No OVs, Consults, or hospital visits since last care coordination call/Pharmacist visit.  ? ?No medication changes indicated  ?BP Readings from Last 3 Encounters:  ?09/02/21 130/78  ?07/16/21 122/90  ?06/09/21 (!) 128/92  ?  ?Lab Results  ?Component Value Date  ? HGBA1C 5.6 03/31/2021  ?  ? ?Patient obtains  medications through Adherence Packaging  30 Days  ? ?Last adherence delivery included:  ?Ezetimibe (Zetia) 10 mg: take one tablet at breakfast ?Atorvastatin (Lipitor)  80 mg: take one tablet at breakfast ?Duloxetine (Cymbalta) 30 mg: take one capsule at breakfast ?Pantoprazole (Protonix) 40 mg: take one tablet at breakfast ?Clopidogrel (Plavix) 75 mg : take one tablet at breakfast ?Metoprolol Succinate (Toprol)  50 mg : take one tablet at breakfast ? ?Patient is due for next adherence delivery on: 11/18/21. ?Called patient and reviewed medications and coordinated delivery. ? ?This delivery to include: ?Duloxetine (Cymbalta) 30 mg: take one capsule at breakfast ?Ezetimibe (Zetia) 10 mg: take one tablet at breakfast ?Pantoprazole (Protonix) 40 mg: take one tablet at breakfast ?Atorvastatin (Lipitor)  80 mg: take one tablet at breakfast ?Clopidogrel (Plavix) 75 mg : take one tablet at breakfast ?Metoprolol Succinate (Toprol)  50 mg : take one tablet at breakfast ?  ? ?Confirmed delivery date of 11/17/21 or 11/19/21 as patient's wife has a procedure scheduled for the 2nd, advised her that pharmacy will contact them the morning of delivery.  ? ?Care Gaps: ?COVID Booster - Overdue ?CCM- 8/23 ?AWV- 2/23 ?BP- 130/78( 09/02/21) ? ?Star Rating Drugs: ?Atorvastatin 80 mg - Last filled 10/13/21 30 DS at [facility] ? ? ?Ned Clines CMA ?Clinical Pharmacist Assistant ?[phone] ? ?

## 2021-12-02 NOTE — Progress Notes (Signed)
Cardiology Office Note:    Date:  12/04/2021   ID:  Derrick Stokes, DOB 04/23/1951, MRN [id number]  PCP:  [name], [name], MD   [facility] Providers Cardiologist:  Sherren Mocha, MD     Referring MD: [name], [name], MD   Chief Complaint: follow-up CAD  History of Present Illness:    Derrick Stokes is a very pleasant 71 y.o. male with a hx of  CAD, HTN, COPD, and hyperlipidemia  Coronary artery disease s/p CABG CABG x 5 2012 [place] 2016:  S-OM, S-RCA 100; L-LAD, S-Dx ok; PCI:  DES to LCx (leading into OM2); DES to RCA Chest pain >> Cath 8/19: 2/4 grafts patent; LCx and RCA stents patent   Seen by Dr. Burt Knack 10/07/20. He was having class III angina.    Cath 10/16/2020 as noted below showing showing stable anatomy. Moderate non obstructive mCx with negative FFR. Medical therapy recommended.  He had hematoma post cath with radial access  He was last seen in our office on 10/23/2020 by Robbie Lis, PA.  He continued to report chest pain which did not require nitroglycerin.  He was intolerant to Imdur due to headache.  Metoprolol tartrate was changed to succinate for better compliance and antianginal effect.  He was advised to follow-up in 4 months.  Today, he is here with his wife for follow-up.  He reports he is feeling well and has had no recent episodes of chest pain.  He continues to have some shortness of breath that he feels is stable.  He continues to smoke 1 ppd.  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  Has to sit on occasion due to back pain, cannot lift anything heavy. Having more coughing when lying down. Had chest CT 01/2020 with pulmonary nodules with recommendation to follow-up in 1 year.  He has not returned for repeat testing.  Past Medical History:  Diagnosis Date   Arthritis    B12 deficiency    Borderline   CAD (coronary artery disease)    a. s/p CABG 01/2011;  b. ETT Myoview 3/14:  Low risk;  c. 09/2014 Cath/PCI: LM nl, LAD 100p, LCX 63mOM2 60-70p (2.75x28 Synergy  DES), RCA dom, 839matherectomy, 3.5x24 Synergy DES), PDA nl, LIMA->LAD nl, VG->Diag nl, VG->OM 100, VG->RCA 100.   Cataract    bilateral sx    COPD (chronic obstructive pulmonary disease) (HCC)    GERD (gastroesophageal reflux disease)    on meds   Hiatal hernia    History of echocardiogram    Echo 10/16: EF 50-55%, no RWMA, Gr 1 DD, mild MR, mild TR   History of kidney stones    Passed   HLD (hyperlipidemia)    on meds   HOH (hard of hearing)    Hypertension    on meds   Leg pain    ABIs 4/14:  R 1.2, L 1.2, TBIs normal   Peripheral neuropathy    From trauma   Reactive airway disease 03/18/2018   Tobacco abuse     Past Surgical History:  Procedure Laterality Date   COLONOSCOPY  03/09/2020   Neg   COLONOSCOPY WITH PROPOFOL N/A 05/08/2020   Procedure: COLONOSCOPY WITH PROPOFOL;  Surgeon: MaIrving Copas MD;  [facility]  Service: Gastroenterology;  Laterality: N/A;   CORONARY ARTERY BYPASS GRAFT  2013   x5   ENDOSCOPIC MUCOSAL RESECTION N/A 05/08/2020   Procedure: ENDOSCOPIC MUCOSAL RESECTION;  Surgeon: MaRush LandmarkaTelford Nab MD;  [place] [facility]  Service: Gastroenterology;  Laterality: N/A;   EPIDURAL STEROIDS     EYE SURGERY Bilateral    Cataract   HEMOSTASIS CLIP PLACEMENT  05/08/2020   Procedure: HEMOSTASIS CLIP PLACEMENT;  Surgeon: Irving Copas., MD;  Location: [place];  Service: Gastroenterology;;   HEMOSTASIS CONTROL  05/08/2020   Procedure: HEMOSTASIS CONTROL;  Surgeon: Irving Copas., MD;  Location: [place];  Service: Gastroenterology;;   INTRAVASCULAR PRESSURE WIRE/FFR STUDY N/A 03/17/2018   Procedure: INTRAVASCULAR PRESSURE WIRE/FFR STUDY;  Surgeon: Lorretta Harp, MD;  Location: [place] CV LAB;  Service: Cardiovascular;  Laterality: N/A;   LEFT HEART CATH AND CORONARY ANGIOGRAPHY N/A 03/17/2018   Procedure: LEFT HEART CATH AND CORONARY ANGIOGRAPHY;  Surgeon: Lorretta Harp, MD;  Location: [place] CV LAB;  Service: Cardiovascular;  Laterality: N/A;   LEFT HEART CATH AND CORS/GRAFTS ANGIOGRAPHY N/A 10/16/2020   Procedure: LEFT HEART CATH AND CORS/GRAFTS ANGIOGRAPHY;  Surgeon: Sherren Mocha, MD;  Location: [place] CV LAB;  Service: Cardiovascular;  Laterality: N/A;   LEFT HEART CATHETERIZATION WITH CORONARY/GRAFT ANGIOGRAM N/A 09/18/2014   Procedure: LEFT HEART CATHETERIZATION WITH [name];  Surgeon: [name], MD;  Location[facility] CATH LAB;  Service: Cardiovascular;  Laterality: N/A;   PERCUTANEOUS CORONARY ROTOBLATOR INTERVENTION (PCI-R) N/A 09/20/2014   Procedure: PERCUTANEOUS CORONARY ROTOBLATOR INTERVENTION (PCI-R);  Surgeon: [name], MD;  [facility] LAB;  Service: Cardiovascular;  Laterality: N/A;   POLYPECTOMY  02/28/2020   6 polyps/tics/hems   POLYPECTOMY  05/08/2020   Procedure: POLYPECTOMY;  Surgeon: Rush Landmark Telford Nab., MD;  Location: [place];  Service: Gastroenterology;;   Clide Deutscher  05/08/2020   Procedure: Clide Deutscher;  Surgeon: Mansouraty, Telford Nab., MD;  Location: [place];  Service: Gastroenterology;;   SUBMUCOSAL LIFTING INJECTION  05/08/2020   Procedure: SUBMUCOSAL LIFTING INJECTION;  Surgeon: Irving Copas., MD;  Location: [place];  Service: Gastroenterology;;    Current Medications: Current Meds  Medication Sig   aspirin EC 81 MG tablet Take 81 mg by mouth at bedtime.    DULoxetine (CYMBALTA) 30 MG capsule Take 1 capsule (30 mg total) by mouth daily.   Omega-3 Fatty Acids (FISH OIL) 1000 MG CPDR Take 1,000 mg by mouth daily.    pantoprazole (PROTONIX) 40 MG tablet TAKE ONE TABLET BY MOUTH ONCE DAILY   vitamin B-12 (CYANOCOBALAMIN) 1000 MCG tablet Take 1,000 mcg by mouth daily.    [DISCONTINUED] atorvastatin (LIPITOR) 80 MG tablet TAKE ONE TABLET BY MOUTH EVERY MORNING   [DISCONTINUED] clopidogrel (PLAVIX) 75 MG tablet TAKE ONE TABLET BY MOUTH EVERY MORNING   [DISCONTINUED]  ezetimibe (ZETIA) 10 MG tablet TAKE ONE TABLET BY MOUTH ONCE DAILY   [DISCONTINUED] metoprolol succinate (TOPROL-XL) 50 MG 24 hr tablet TAKE ONE TABLET BY MOUTH ONCE DAILY WITH A MEAL   [DISCONTINUED] nitroGLYCERIN (NITROSTAT) 0.4 MG SL tablet PLACE 1 TABLET UNDER THE TONGUE EVERY 5 MINUTES X 3 DOSES AS NEEDED FOR CHEST PAIN. (Patient taking differently: Place 0.4 mg under the tongue every 5 (five) minutes as needed for chest pain.)     Allergies:   Iodine, Iohexol, Shrimp [shellfish allergy], and Isosorbide dinitrate   Social History   Socioeconomic History   Marital status: Married    Spouse name: Not on file   Number of children: Not on file   Years of education: Not on file   Highest education level: Not on file  Occupational History   Occupation: [employer]  Tobacco Use   Smoking status: Every Day  Packs/day: 1.00    Years: 51.00    Pack years: 51.00    Types: Cigarettes    Start date: 1970    Passive exposure: Past   Smokeless tobacco: Never  Vaping Use   Vaping Use: Never used  Substance and Sexual Activity   Alcohol use: Yes    Alcohol/week: 3.0 standard drinks    Types: 3 Cans of beer per week   Drug use: No   Sexual activity: Yes  Other Topics Concern   Not on file  Social History Narrative   Married   Gets regular exercise - walks 1/4 mile per day 5 times a week without symptoms   Social Determinants of Health   Financial Resource Strain: Medium Risk   Difficulty of Paying Living Expenses: Somewhat hard  Food Insecurity: No Food Insecurity   Worried About Charity fundraiser in the Last Year: Never true   Ran Out of Food in the Last Year: Never true  Transportation Needs: No Transportation Needs   Lack of Transportation (Medical): No   Lack of Transportation (Non-Medical): No  Physical Activity: Inactive   Days of Exercise per Week: 0 days   Minutes of Exercise per Session: 0 min  Stress: Stress Concern Present   Feeling of Stress : Rather much   Social Connections: Not on file     Family History: The patient's family history includes Cancer in his mother; Diabetes in his brother, brother, and father; Heart attack (age of onset: 34) in his father; Heart disease in his father. There is no history of Colon cancer, Esophageal cancer, Stomach cancer, Pancreatic cancer, Inflammatory bowel disease, Liver disease, Rectal cancer, or Colon polyps.  ROS:   Please see the history of present illness.    + back and leg pain All other systems reviewed and are negative.  Labs/Other Studies Reviewed:    The following studies were reviewed today:  LHC 10/16/20   1.  Multivessel coronary artery disease with chronic total occlusion of the proximal LAD, continued patency with moderate in-stent restenosis in the left circumflex, wide patency of the first OM branch, and patency of the dominant RCA with mild nonobstructive in-stent restenosis 2.  Status post aortocoronary bypass surgery with continued patency of the saphenous vein graft to first diagonal and LIMA to LAD, other vein grafts are known to be occluded to the RCA and circumflex territories   Recommendations: Ongoing medical therapy.  Stable coronary anatomy noted when compared side-by-side with previous coronary angiogram from 2019.  I do not see any targets for intervention.  The moderate nonobstructive appearing lesion in the mid circumflex was previously interrogated with FFR and this was negative.  This lesion has not progressed.   LHC 03/17/18  Previously placed Prox RCA to Mid RCA stent (unknown type) is widely patent. Ost LAD lesion is 100% stenosed. Origin to Prox Graft lesion is 100% stenosed. Origin lesion is 100% stenosed. Ost Cx to Mid Cx lesion is 50% stenosed. The left ventricular systolic function is normal. LV end diastolic pressure is normal. The left ventricular ejection fraction is 55-65% by visual estimate.    Echo 05/02/15  - Left ventricle: The cavity size was  normal. Systolic function was    normal. The estimated ejection fraction was in the range of 50%    to 55%. Wall motion was normal; there were no regional wall    motion abnormalities. Doppler parameters are consistent with    abnormal left ventricular relaxation (grade 1 diastolic  dysfunction). There was no evidence of elevated ventricular    filling pressure by Doppler parameters.  - Aortic valve: Trileaflet; mildly thickened, mildly calcified    leaflets. There was no regurgitation.  - Aortic root: The aortic root was normal in size.  - Mitral valve: Structurally normal valve. There was mild    regurgitation.  - Left atrium: The atrium was normal in size.  - Right ventricle: Systolic function was normal.  - Right atrium: The atrium was normal in size.  - Tricuspid valve: There was mild regurgitation.  - Pulmonic valve: Structurally normal valve. There was no    regurgitation.  - Pulmonary arteries: Systolic pressure was within the normal    range.  - Inferior vena cava: The vessel was normal in size.  - Pericardium, extracardiac: There was no pericardial effusion.   Recent Labs: 03/31/2021: ALT 24; BUN 9; Creatinine, Ser 0.80; Potassium 4.8; Sodium 137; TSH 1.75  Recent Lipid Panel    Component Value Date/Time   CHOL 139 09/20/2020 0858   CHOL 133 03/08/2019 0905   TRIG 104.0 09/20/2020 0858   HDL 43.20 09/20/2020 0858   HDL 36 (L) 03/08/2019 0905   CHOLHDL 3 09/20/2020 0858   VLDL 20.8 09/20/2020 0858   LDLCALC 75 09/20/2020 0858   LDLCALC 101 (H) 02/05/2020 1417   LDLDIRECT 68.0 11/19/2014 0737     Risk Assessment/Calculations:       Physical Exam:    VS:  BP 120/82   Pulse (!) 59   Ht '5\' 8"'$  (1.727 m)   Wt 182 lb (82.6 kg)   BMI 27.67 kg/m     Wt Readings from Last 3 Encounters:  12/04/21 182 lb (82.6 kg)  09/02/21 182 lb 3.2 oz (82.6 kg)  06/09/21 182 lb (82.6 kg)     GEN:  Well nourished, well developed in no acute distress HEENT: Normal NECK: No  JVD; No carotid bruits CARDIAC: RRR, no murmurs, rubs, gallops RESPIRATORY:  Clear to auscultation without rales, wheezing or rhonchi  ABDOMEN: Soft, non-tender, non-distended MUSCULOSKELETAL:  No edema; No deformity. 2+ pedal pulses, equal bilaterally SKIN: Warm and dry NEUROLOGIC:  Alert and oriented x 3 PSYCHIATRIC:  Normal affect   EKG:  EKG is ordered today.  The ekg ordered today demonstrates sinus bradycardia at 59 bpm, no ST/T wave abnormality, no acute change from previous  Diagnoses:    1. Tobacco abuse   2. Hyperlipidemia LDL goal <70   3. Coronary artery disease involving native coronary artery of native heart without angina pectoris   4. Pulmonary nodules   5. S/P coronary artery bypass graft x 5   6. Essential hypertension    Assessment and Plan:     CAD s/p CABG x 5 without angina: Last catheterization on 10/16/2020 revealed stable coronary anatomy from 2019, no targets for intervention.  Continue medical therapy.  He denies chest pain. Feels that dyspnea is stable and does not worsen with exertion. Reports recent worsening cough. No indication for further ischemic evaluation at this time.  Was switched from metoprolol to tartrate to succinate at last office visit.  Wife reports he is more compliant on this medication.  No bleeding problems on DAPT. Continue statin, ezetimibe Plavix, metoprolol, and aspirin.  Hypertension: BP is well-controlled here. He does not monitor at home.  I have asked him to monitor BP on a regular basis and report to [place] if it is consistently greater than 130/80.   Hyperlipidemia LDL goal < 70: LDL 75  on 09/20/2020.  Due for lab work with PCP in 5 days.  Continue atorvastatin.  Pulmonary nodules: Chest CT 02/15/2020 with pulmonary nodules benign in appearance and recommendation to repeat scan in 1 year.  We will refer patient back to pulmonology for follow-up.  Tobacco abuse: He continues to smoke 1 PPD.  Not interested in nicotine replacement or  other mechanisms to help him quit.  Complete cessation advised.  Disposition: 1 year with Dr. Burt Knack or APP   Medication Adjustments/Labs and Tests Ordered: Current medicines are reviewed at length with the patient today.  Concerns regarding medicines are outlined above.  Orders Placed This Encounter  Procedures   EKG 12-Lead   Meds ordered this encounter  Medications   atorvastatin (LIPITOR) 80 MG tablet    Sig: Take 1 tablet (80 mg total) by mouth every morning.    Dispense:  90 tablet    Refill:  3   clopidogrel (PLAVIX) 75 MG tablet    Sig: TAKE ONE TABLET BY MOUTH EVERY MORNING    Dispense:  90 tablet    Refill:  3   metoprolol succinate (TOPROL-XL) 50 MG 24 hr tablet    Sig: TAKE ONE TABLET BY MOUTH ONCE DAILY WITH A MEAL    Dispense:  90 tablet    Refill:  3   nitroGLYCERIN (NITROSTAT) 0.4 MG SL tablet    Sig: Place 1 tablet (0.4 mg total) under the tongue every 5 (five) minutes as needed for chest pain. PLACE 1 TABLET UNDER THE TONGUE EVERY 5 MINUTES X 3 DOSES AS NEEDED FOR CHEST PAIN.    Dispense:  25 tablet    Refill:  3   ezetimibe (ZETIA) 10 MG tablet    Sig: Take 1 tablet (10 mg total) by mouth daily.    Dispense:  90 tablet    Refill:  3    Patient Instructions  Medication Instructions:  Your physician recommends that you continue on your current medications as directed. Please refer to the Current Medication list given to you today.   *If you need a refill on your cardiac medications before your next appointment, please call your pharmacy*   Lab Work: None ordered  If you have labs (blood work) drawn today and your tests are completely normal, you will receive your results only by: [place] (if you have MyChart) OR A paper copy in the mail If you have any lab test that is abnormal or we need to change your treatment, we will call you to review the results.   Testing/Procedures: None ordered   Follow-Up: At [facility], you and your  health needs are our priority.  As part of our continuing mission to provide you with exceptional heart care, we have created designated Provider Care Teams.  These Care Teams include your primary Cardiologist (physician) and Advanced Practice Providers (APPs -  Physician Assistants and Nurse Practitioners) who all work together to provide you with the care you need, when you need it.  We recommend signing up for the patient portal called "MyChart".  Sign up information is provided on this After Visit Summary.  MyChart is used to connect with patients for Virtual Visits (Telemedicine).  Patients are able to view lab/test results, encounter notes, upcoming appointments, etc.  Non-urgent messages can be sent to your provider as well.   To learn more about what you can do with MyChart, go to [email or web address].    Your next appointment:   12 month(s)  The format for  your next appointment:   In Person  Provider:   Sherren Mocha, MD or APP    Other Instructions Your physician has requested that you regularly monitor and record your blood pressure readings at home. Please use the same machine at the same time of day to check your readings and record them to bring to your follow-up visit.   Please monitor blood pressures and keep a log of your readings.  Let us know if you are consistently seeing them run higher than 130/80    Make sure to check 2 hours after your medications.    AVOID these things for 30 minutes before checking your blood pressure: No Drinking caffeine. No Drinking alcohol. No Eating. No Smoking. No Exercising.   Five minutes before checking your blood pressure: Pee. Sit in a dining chair. Avoid sitting in a soft couch or armchair. Be quiet. Do not talk  Important Information About Sugar         Signed, Emmaline Life, NP  12/04/2021 4:21 PM    [place]

## 2021-12-04 ENCOUNTER — Ambulatory Visit: Payer: Medicare HMO | Admitting: Nurse Practitioner

## 2021-12-04 ENCOUNTER — Encounter: Payer: Self-pay | Admitting: Nurse Practitioner

## 2021-12-04 VITALS — BP 120/82 | HR 59 | Ht 68.0 in | Wt 182.0 lb

## 2021-12-04 DIAGNOSIS — Z951 Presence of aortocoronary bypass graft: Secondary | ICD-10-CM | POA: Diagnosis not present

## 2021-12-04 DIAGNOSIS — I2510 Atherosclerotic heart disease of native coronary artery without angina pectoris: Secondary | ICD-10-CM

## 2021-12-04 DIAGNOSIS — R918 Other nonspecific abnormal finding of lung field: Secondary | ICD-10-CM | POA: Diagnosis not present

## 2021-12-04 DIAGNOSIS — Z720 Tobacco use: Secondary | ICD-10-CM

## 2021-12-04 DIAGNOSIS — E785 Hyperlipidemia, unspecified: Secondary | ICD-10-CM | POA: Diagnosis not present

## 2021-12-04 DIAGNOSIS — I10 Essential (primary) hypertension: Secondary | ICD-10-CM | POA: Diagnosis not present

## 2021-12-04 MED ORDER — METOPROLOL SUCCINATE ER 50 MG PO TB24: ORAL_TABLET | ORAL | 3 refills | Status: DC

## 2021-12-04 MED ORDER — CLOPIDOGREL BISULFATE 75 MG PO TABS
ORAL_TABLET | ORAL | 3 refills | Status: DC
Start: 2021-12-04 — End: 2022-11-30

## 2021-12-04 MED ORDER — NITROGLYCERIN 0.4 MG SL SUBL: 0.4000 mg | SUBLINGUAL_TABLET | SUBLINGUAL | 3 refills | Status: AC | PRN

## 2021-12-04 MED ORDER — ATORVASTATIN CALCIUM 80 MG PO TABS: 80.0000 mg | ORAL_TABLET | Freq: Every morning | ORAL | 3 refills | Status: DC

## 2021-12-04 MED ORDER — EZETIMIBE 10 MG PO TABS: 10.0000 mg | ORAL_TABLET | Freq: Every day | ORAL | 3 refills | Status: DC

## 2021-12-04 NOTE — Patient Instructions (Signed)
Medication Instructions:  Your physician recommends that you continue on your current medications as directed. Please refer to the Current Medication list given to you today.   *If you need a refill on your cardiac medications before your next appointment, please call your pharmacy*   Lab Work: None ordered  If you have labs (blood work) drawn today and your tests are completely normal, you will receive your results only by: [place] (if you have MyChart) OR A paper copy in the mail If you have any lab test that is abnormal or we need to change your treatment, we will call you to review the results.   Testing/Procedures: None ordered   Follow-Up: At [facility], you and your health needs are our priority.  As part of our continuing mission to provide you with exceptional heart care, we have created designated Provider Care Teams.  These Care Teams include your primary Cardiologist (physician) and Advanced Practice Providers (APPs -  Physician Assistants and Nurse Practitioners) who all work together to provide you with the care you need, when you need it.  We recommend signing up for the patient portal called "MyChart".  Sign up information is provided on this After Visit Summary.  MyChart is used to connect with patients for Virtual Visits (Telemedicine).  Patients are able to view lab/test results, encounter notes, upcoming appointments, etc.  Non-urgent messages can be sent to your provider as well.   To learn more about what you can do with MyChart, go to [email or web address].    Your next appointment:   12 month(s)  The format for your next appointment:   In Person  Provider:   Sherren Mocha, MD or APP    Other Instructions Your physician has requested that you regularly monitor and record your blood pressure readings at home. Please use the same machine at the same time of day to check your readings and record them to bring to your follow-up visit.   Please  monitor blood pressures and keep a log of your readings.  Let us know if you are consistently seeing them run higher than 130/80    Make sure to check 2 hours after your medications.    AVOID these things for 30 minutes before checking your blood pressure: No Drinking caffeine. No Drinking alcohol. No Eating. No Smoking. No Exercising.   Five minutes before checking your blood pressure: Pee. Sit in a dining chair. Avoid sitting in a soft couch or armchair. Be quiet. Do not talk  Important Information About Sugar

## 2021-12-05 ENCOUNTER — Telehealth: Payer: Self-pay | Admitting: Cardiovascular Disease

## 2021-12-05 ENCOUNTER — Telehealth: Payer: Self-pay | Admitting: Pharmacist

## 2021-12-05 ENCOUNTER — Other Ambulatory Visit: Payer: Self-pay

## 2021-12-05 ENCOUNTER — Telehealth: Payer: Self-pay

## 2021-12-05 DIAGNOSIS — G8929 Other chronic pain: Secondary | ICD-10-CM

## 2021-12-05 DIAGNOSIS — M792 Neuralgia and neuritis, unspecified: Secondary | ICD-10-CM

## 2021-12-05 MED ORDER — DULOXETINE HCL 30 MG PO CPEP: 30.0000 mg | ORAL_CAPSULE | Freq: Every day | ORAL | 3 refills | Status: DC

## 2021-12-05 MED ORDER — PANTOPRAZOLE SODIUM 40 MG PO TBEC: 40.0000 mg | DELAYED_RELEASE_TABLET | Freq: Every day | ORAL | 1 refills | Status: DC

## 2021-12-05 NOTE — Chronic Care Management (AMB) (Signed)
Chronic Care Management Pharmacy Assistant   Name: Derrick Stokes  MRN: [id number] DOB: 11-10-1950  Reason for Encounter: Medication Review/ Medication Coordination   Recent office visits:  12/04/21 Swinyer, Lanice Schwab, NP - Patient presented for Tobacco abuse and other concerns. Changed Nitroglycerin 0.4 mg. Stopped Ondansetron 8 mg.   Recent consult visits:  None  Hospital visits:  None in previous 6 months  Medications: Outpatient Encounter Medications as of 12/05/2021  Medication Sig   aspirin EC 81 MG tablet Take 81 mg by mouth at bedtime.    atorvastatin (LIPITOR) 80 MG tablet Take 1 tablet (80 mg total) by mouth every morning.   clopidogrel (PLAVIX) 75 MG tablet TAKE ONE TABLET BY MOUTH EVERY MORNING   DULoxetine (CYMBALTA) 30 MG capsule Take 1 capsule (30 mg total) by mouth daily.   ezetimibe (ZETIA) 10 MG tablet Take 1 tablet (10 mg total) by mouth daily.   metoprolol succinate (TOPROL-XL) 50 MG 24 hr tablet TAKE ONE TABLET BY MOUTH ONCE DAILY WITH A MEAL   nitroGLYCERIN (NITROSTAT) 0.4 MG SL tablet Place 1 tablet (0.4 mg total) under the tongue every 5 (five) minutes as needed for chest pain. PLACE 1 TABLET UNDER THE TONGUE EVERY 5 MINUTES X 3 DOSES AS NEEDED FOR CHEST PAIN.   Omega-3 Fatty Acids (FISH OIL) 1000 MG CPDR Take 1,000 mg by mouth daily.    pantoprazole (PROTONIX) 40 MG tablet TAKE ONE TABLET BY MOUTH ONCE DAILY   vitamin B-12 (CYANOCOBALAMIN) 1000 MCG tablet Take 1,000 mcg by mouth daily.    No facility-administered encounter medications on file as of 12/05/2021.  Reviewed chart for medication changes ahead of medication coordination call.  No OVs, Consults, or hospital visits since last care coordination call/Pharmacist visit.  No medication changes indicated  BP Readings from Last 3 Encounters:  12/04/21 120/82  09/02/21 130/78  07/16/21 122/90    Lab Results  Component Value Date   HGBA1C 5.6 03/31/2021     Patient obtains medications through  Adherence Packaging  30 Days   Last adherence delivery included: (medication name and frequency) Duloxetine (Cymbalta) 30 mg: take one capsule at breakfast Ezetimibe (Zetia) 10 mg: take one tablet at breakfast Pantoprazole (Protonix) 40 mg: take one tablet at breakfast Atorvastatin (Lipitor)  80 mg: take one tablet at breakfast Clopidogrel (Plavix) 75 mg : take one tablet at breakfast Metoprolol Succinate (Toprol)  50 mg : take one tablet at breakfast   Patient is due for next adherence delivery on: 12/17/21. Called patient and reviewed medications and coordinated delivery with wife Packs 30 DS  This delivery to include: Duloxetine (Cymbalta) 30 mg: take one capsule at breakfast Ezetimibe (Zetia) 10 mg: take one tablet at breakfast Pantoprazole (Protonix) 40 mg: take one tablet at breakfast Atorvastatin (Lipitor)  80 mg: take one tablet at breakfast Clopidogrel (Plavix) 75 mg : take one tablet at breakfast Metoprolol Succinate (Toprol)  50 mg : take one tablet at breakfast   Patient needs refills for 90 DS per wife. 90 DS were sent over to the pharmacy for all but Pantoprazole requested from Cardiology also requested 90 DS for his Duloxetine from [facility]  Confirmed delivery date of 12/17/21, advised patient that pharmacy will contact them the morning of delivery.   Care Gaps: COVID Booster - Overdue Zoster Vaccine - Postponed BP- 120/82 ( 12/04/21) AWV- 2/23 CCM- 8/23  Star Rating Drugs: Atorvastatin 80 mg - Last filled 11/11/21 30 DS at [facility]  [place] Pharmacist Assistant [phone]

## 2021-12-05 NOTE — Telephone Encounter (Signed)
*  STAT* If patient is at the pharmacy, call can be transferred to refill team.   1. Which medications need to be refilled? (please list name of each medication and dose if known) pantoprazole (PROTONIX) 40 MG tablet  2. Which pharmacy/location (including street and city if local pharmacy) is medication to be sent to? [facility] - [place], [place] - [place] [address]  3. Do they need a 30 day or 90 day supply? 90 day

## 2021-12-05 NOTE — Telephone Encounter (Signed)
-----   Message from Viona Gilmore, [facility] sent at 12/05/2021  9:51 AM EDT ----- Regarding: Duloxetine refill Hi,  Can you please send a refill for duloxetine for 90 ds to [facility]?  Thanks! Maddie

## 2021-12-05 NOTE — Progress Notes (Signed)
Chief Complaint  Patient presents with   Annual Exam   Cough    X a few weeks, has not tried taking OTC meds. Denies SOB or fever.    Nasal Congestion    X a few weeks, has not tried taking OTC meds   HPI: [name] is a 71 y.o.male here today with his wife for his routine physical examination and follow up + acute problem.  Last CPE: 09/20/20  Regular exercise: He does not regularly but he is active with chores around the house. Following a healthful diet: Cooking some at home, decreased fried foods intake, small portions.  Chronic medical problems: Peripheral neuropathy, CAD s/p CABG x 5, b12 def,chronic back pain,HLD, COPD,tobacco use,and GERD among some.  Immunization History  Administered Date(s) Administered   Fluad Quad(high Dose 65+) 03/31/2021   Influenza, High Dose Seasonal PF 09/14/2018, 04/25/2019, 04/23/2020   Influenza,inj,Quad PF,6+ Mos 04/17/2014, 05/06/2015, 06/08/2016   Influenza,inj,Quad PF,6-35 Mos 04/25/2019   Moderna Sars-Covid-2 Vaccination 08/15/2019, 09/12/2019   Pneumococcal Conjugate-13 09/14/2018   Pneumococcal Polysaccharide-23 06/08/2016, 06/09/2021   Tdap 05/01/2014   Tetanus 04/17/2014   Zoster, Live 05/01/2014   Health Maintenance  Topic Date Due   COVID-19 Vaccine (3 - Booster for Moderna series) 11/07/2019   Zoster Vaccines- Shingrix (1 of 2) 07/25/2026 (Originally 09/20/2002)   INFLUENZA VACCINE  02/17/2022   URINE MICROALBUMIN  03/31/2022   TETANUS/TDAP  05/01/2024   COLONOSCOPY (Pts 45-80yr Insurance coverage will need to be confirmed)  05/08/2030   Pneumonia Vaccine 71 Years old  Completed   Hepatitis C Screening  Completed   HPV VACCINES  Aged Out   Last prostate ca screening: Nocturia x 1, stable for years. Lab Results  Component Value Date   PSA 0.97 09/14/2018   -Concerns and/or follow up today:  HLD: He is on Atorvastatin 80 mg daily and Zetia 10 mg daily. Tolerating medication well.  Lab Results  Component  Value Date   CHOL 139 09/20/2020   HDL 43.20 09/20/2020   LDLCALC 75 09/20/2020   LDLDIRECT 68.0 11/19/2014   TRIG 104.0 09/20/2020   CHOLHDL 3 09/20/2020   B12 def: He is on B12 1000 mcg daily.  Lab Results  Component Value Date   VITAMINB12 820 03/31/2021   HTN: On Metoprolol succinate 50 mg daily. CAD on Plavix 75 mg daily and Aspirin 81 mg daily. Follows with cardiologist annually.  Lab Results  Component Value Date   CREATININE 0.80 03/31/2021   BUN 9 03/31/2021   NA 137 03/31/2021   K 4.8 03/31/2021   CL 99 03/31/2021   CO2 28 03/31/2021   Today he is c/o a couple weeks of productive cough, wheezing,and nasal congestion. He has not tolerated Albuterol inh well, makes him sick.  He has not tried OTC medications.  Review of Systems  Constitutional:  Positive for fatigue. Negative for activity change, appetite change and fever.  HENT:  Positive for postnasal drip and rhinorrhea. Negative for dental problem, ear pain, nosebleeds, sinus pain, sore throat, trouble swallowing and voice change.   Eyes:  Negative for redness and visual disturbance.  Respiratory:  Positive for cough and wheezing. Negative for shortness of breath.   Cardiovascular:  Negative for chest pain, palpitations and leg swelling.  Gastrointestinal:  Negative for abdominal pain, blood in stool, nausea and vomiting.  Endocrine: Negative for cold intolerance, heat intolerance, polydipsia, polyphagia and polyuria.  Genitourinary:  Negative for decreased urine volume, dysuria, genital sores, hematuria and  testicular pain.  Musculoskeletal:  Positive for arthralgias and back pain.  Skin:  Negative for color change and rash.  Allergic/Immunologic: Negative for environmental allergies.  Neurological:  Negative for syncope, weakness and headaches.  Hematological:  Negative for adenopathy. Does not bruise/bleed easily.  Psychiatric/Behavioral:  Negative for behavioral problems and confusion.    Current  Outpatient Medications on File Prior to Visit  Medication Sig Dispense Refill   aspirin EC 81 MG tablet Take 81 mg by mouth at bedtime.      atorvastatin (LIPITOR) 80 MG tablet Take 1 tablet (80 mg total) by mouth every morning. 90 tablet 3   clopidogrel (PLAVIX) 75 MG tablet TAKE ONE TABLET BY MOUTH EVERY MORNING 90 tablet 3   DULoxetine (CYMBALTA) 30 MG capsule Take 1 capsule (30 mg total) by mouth daily. 90 capsule 3   ezetimibe (ZETIA) 10 MG tablet Take 1 tablet (10 mg total) by mouth daily. 90 tablet 3   metoprolol succinate (TOPROL-XL) 50 MG 24 hr tablet TAKE ONE TABLET BY MOUTH ONCE DAILY WITH A MEAL 90 tablet 3   nitroGLYCERIN (NITROSTAT) 0.4 MG SL tablet Place 1 tablet (0.4 mg total) under the tongue every 5 (five) minutes as needed for chest pain. PLACE 1 TABLET UNDER THE TONGUE EVERY 5 MINUTES X 3 DOSES AS NEEDED FOR CHEST PAIN. 25 tablet 3   Omega-3 Fatty Acids (FISH OIL) 1000 MG CPDR Take 1,000 mg by mouth daily.      pantoprazole (PROTONIX) 40 MG tablet Take 1 tablet (40 mg total) by mouth daily. 90 tablet 1   vitamin B-12 (CYANOCOBALAMIN) 1000 MCG tablet Take 1,000 mcg by mouth daily.      No current facility-administered medications on file prior to visit.   Past Medical History:  Diagnosis Date   Arthritis    B12 deficiency    Borderline   CAD (coronary artery disease)    a. s/p CABG 01/2011;  b. ETT Myoview 3/14:  Low risk;  c. 09/2014 Cath/PCI: LM nl, LAD 100p, LCX 19mOM2 60-70p (2.75x28 Synergy DES), RCA dom, 87matherectomy, 3.5x24 Synergy DES), PDA nl, LIMA->LAD nl, VG->Diag nl, VG->OM 100, VG->RCA 100.   Cataract    bilateral sx    COPD (chronic obstructive pulmonary disease) (HCC)    GERD (gastroesophageal reflux disease)    on meds   Hiatal hernia    History of echocardiogram    Echo 10/16: EF 50-55%, no RWMA, Gr 1 DD, mild MR, mild TR   History of kidney stones    Passed   HLD (hyperlipidemia)    on meds   HOH (hard of hearing)    Hypertension    on meds    Leg pain    ABIs 4/14:  R 1.2, L 1.2, TBIs normal   Peripheral neuropathy    From trauma   Reactive airway disease 03/18/2018   Tobacco abuse    Past Surgical History:  Procedure Laterality Date   COLONOSCOPY  03/09/2020   Neg   COLONOSCOPY WITH PROPOFOL N/A 05/08/2020   Procedure: COLONOSCOPY WITH PROPOFOL;  Surgeon: MaIrving Copas MD;  [facility];  Service: Gastroenterology;  Laterality: N/A;   CORONARY ARTERY BYPASS GRAFT  2013   x5   ENDOSCOPIC MUCOSAL RESECTION N/A 05/08/2020   Procedure: ENDOSCOPIC MUCOSAL RESECTION;  Surgeon: MaRush LandmarkaTelford Nab MD;  [place]: G[facility];  Laterality: N/A;   EPIDURAL STEROIDS     EYE SURGERY Bilateral    Cataract   HEMOSTASIS  CLIP PLACEMENT  05/08/2020   Procedure: HEMOSTASIS CLIP PLACEMENT;  Surgeon: Irving Copas., MD;  Location: [place];  Service: Gastroenterology;;   HEMOSTASIS CONTROL  05/08/2020   Procedure: HEMOSTASIS CONTROL;  Surgeon: Irving Copas., MD;  Location: [place];  Service: Gastroenterology;;   INTRAVASCULAR PRESSURE WIRE/FFR STUDY N/A 03/17/2018   Procedure: INTRAVASCULAR PRESSURE WIRE/FFR STUDY;  Surgeon: Lorretta Harp, MD;  Location: [place] CV LAB;  Service: Cardiovascular;  Laterality: N/A;   LEFT HEART CATH AND CORONARY ANGIOGRAPHY N/A 03/17/2018   Procedure: LEFT HEART CATH AND CORONARY ANGIOGRAPHY;  Surgeon: Lorretta Harp, MD;  Location: [place] CV LAB;  Service: Cardiovascular;  Laterality: N/A;   LEFT HEART CATH AND CORS/GRAFTS ANGIOGRAPHY N/A 10/16/2020   Procedure: LEFT HEART CATH AND CORS/GRAFTS ANGIOGRAPHY;  Surgeon: Sherren Mocha, MD;  Location: [place] CV LAB;  Service: Cardiovascular;  Laterality: N/A;   LEFT HEART CATHETERIZATION WITH CORONARY/GRAFT ANGIOGRAM N/A 09/18/2014   Procedure: LEFT HEART CATHETERIZATION WITH [name];  Surgeon: [name], MD;  Location: [facility] CATH LAB;   Service: Cardiovascular;  Laterality: N/A;   PERCUTANEOUS CORONARY ROTOBLATOR INTERVENTION (PCI-R) N/A 09/20/2014   Procedure: PERCUTANEOUS CORONARY ROTOBLATOR INTERVENTION (PCI-R);  Surgeon: [name], MD;  Location: [facility] CATH LAB;  Service: Cardiovascular;  Laterality: N/A;   POLYPECTOMY  02/28/2020   6 polyps/tics/hems   POLYPECTOMY  05/08/2020   Procedure: POLYPECTOMY;  Surgeon: Mansouraty, Telford Nab., MD;  Location: [place];  Service: Gastroenterology;;   Clide Deutscher  05/08/2020   Procedure: Clide Deutscher;  Surgeon: Mansouraty, Telford Nab., MD;  Location: [place];  Service: Gastroenterology;;   SUBMUCOSAL LIFTING INJECTION  05/08/2020   Procedure: SUBMUCOSAL LIFTING INJECTION;  Surgeon: Irving Copas., MD;  Location: [facility] ENDOSCOPY;  Service: Gastroenterology;;    Allergies  Allergen Reactions   Iodine Nausea And Vomiting    Turns white in face and sweaty   Iohexol Other (See Comments)    May have caused nausea and vomiting several years ago    Shrimp [Shellfish Allergy] Nausea And Vomiting    Turns white in face and sweaty   Isosorbide Dinitrate Other (See Comments)    headache    Family History  Problem Relation Age of Onset   Diabetes Father    Heart attack Father 68   Heart disease Father        CAD   Cancer Mother    Diabetes Brother    Diabetes Brother    Colon cancer Neg Hx    Esophageal cancer Neg Hx    Stomach cancer Neg Hx    Pancreatic cancer Neg Hx    Inflammatory bowel disease Neg Hx    Liver disease Neg Hx    Rectal cancer Neg Hx    Colon polyps Neg Hx     Social History   Socioeconomic History   Marital status: Married    Spouse name: Not on file   Number of children: Not on file   Years of education: Not on file   Highest education level: Not on file  Occupational History   Occupation: [employer]  Tobacco Use   Smoking status: Every Day    Packs/day: 1.00    Years: 51.00    Pack years: 51.00    Types:  Cigarettes    Start date: 1970    Passive exposure: Past   Smokeless tobacco: Never  Vaping Use   Vaping Use: Never used  Substance and Sexual Activity   Alcohol use: Yes  Alcohol/week: 3.0 standard drinks    Types: 3 Cans of beer per week   Drug use: No   Sexual activity: Yes  Other Topics Concern   Not on file  Social History Narrative   Married   Gets regular exercise - walks 1/4 mile per day 5 times a week without symptoms   Social Determinants of Health   Financial Resource Strain: Medium Risk   Difficulty of Paying Living Expenses: Somewhat hard  Food Insecurity: No Food Insecurity   Worried About Charity fundraiser in the Last Year: Never true   Ran Out of Food in the Last Year: Never true  Transportation Needs: No Transportation Needs   Lack of Transportation (Medical): No   Lack of Transportation (Non-Medical): No  Physical Activity: Inactive   Days of Exercise per Week: 0 days   Minutes of Exercise per Session: 0 min  Stress: Stress Concern Present   Feeling of Stress : Rather much  Social Connections: Not on file   Vitals:   12/08/21 0727  BP: 130/82  Pulse: 64  Resp: 16  Temp: 98 F (36.7 C)  SpO2: 99%   Body mass index is 28.08 kg/m.  Wt Readings from Last 3 Encounters:  12/08/21 182 lb (82.6 kg)  12/04/21 182 lb (82.6 kg)  09/02/21 182 lb 3.2 oz (82.6 kg)   Physical Exam Vitals and nursing note reviewed.  Constitutional:      General: He is not in acute distress.    Appearance: He is well-developed.  HENT:     Head: Normocephalic and atraumatic.     Right Ear: Tympanic membrane, ear canal and external ear normal.     Left Ear: Tympanic membrane, ear canal and external ear normal.     Nose: Congestion present.     Mouth/Throat:     Mouth: Mucous membranes are moist.     Pharynx: Oropharynx is clear.  Eyes:     Conjunctiva/sclera: Conjunctivae normal.     Pupils: Pupils are equal, round, and reactive to light.  Neck:     Thyroid: No  thyroid mass.     Vascular: No JVD.     Trachea: No tracheal deviation.  Cardiovascular:     Rate and Rhythm: Normal rate and regular rhythm.     Heart sounds: No murmur heard.    Comments: DP pulses present. Pulmonary:     Effort: Pulmonary effort is normal. No respiratory distress.     Breath sounds: No stridor. Wheezing and rhonchi present. No rales.  Abdominal:     Palpations: Abdomen is soft. There is no hepatomegaly or mass.     Tenderness: There is no abdominal tenderness.  Genitourinary:    Comments: No concerns. Musculoskeletal:     Cervical back: Normal range of motion.     Comments: No signs of synovitis.  Lymphadenopathy:     Cervical: No cervical adenopathy.  Skin:    General: Skin is warm.     Findings: No erythema.  Neurological:     General: No focal deficit present.     Mental Status: He is alert and oriented to person, place, and time.     Cranial Nerves: No cranial nerve deficit.     Sensory: No sensory deficit.     Gait: Gait normal.     Deep Tendon Reflexes:     Reflex Scores:      Bicep reflexes are 2+ on the right side and 2+ on the left side.  Patellar reflexes are 2+ on the right side and 2+ on the left side. Psychiatric:        Mood and Affect: Mood and affect normal.   ASSESSMENT AND PLAN:  Mr.Granite was seen today for annual exam, cough and nasal congestion.  Diagnoses and all orders for this visit: Orders Placed This Encounter  Procedures   PSA(Must document that pt has been informed of limitations of PSA testing.)   Lipid panel   Comprehensive metabolic panel   Lab Results  Component Value Date   PSA 0.63 12/08/2021   PSA 0.97 09/14/2018   Lab Results  Component Value Date   CHOL 111 12/08/2021   HDL 41.20 12/08/2021   LDLCALC 51 12/08/2021   LDLDIRECT 68.0 11/19/2014   TRIG 96.0 12/08/2021   CHOLHDL 3 12/08/2021   Lab Results  Component Value Date   CREATININE 0.89 12/08/2021   BUN 10 12/08/2021   NA 138 12/08/2021    K 4.9 12/08/2021   CL 102 12/08/2021   CO2 28 12/08/2021   Lab Results  Component Value Date   ALT 19 12/08/2021   AST 18 12/08/2021   ALKPHOS 80 12/08/2021   BILITOT 0.9 12/08/2021   Routine general medical examination at a health care facility We discussed the importance of regular physical activity and healthy diet for prevention of chronic illness and/or complications. Preventive guidelines reviewed. Vaccination up to date. His wife will call to schedule lung cancer screening. Next CPE in a year.  Hyperlipidemia, unspecified hyperlipidemia type LDL goal < 70, ideally < 55. Continue Atorvastatin 80 mg daily, Zetia 10 mg daily,and low fat diet.  Essential (primary) hypertension BP adequately controlled. Continue Metoprolol succinate 50 mg daily and low salt diet. Monitor BP at home.  Atherosclerosis of aorta (HCC) Continue Atorvastatin and aspirin same dose.  Coronary artery disease involving native coronary artery of native heart with angina pectoris [facility]) Following with cardiologist. Continue Plavix,Metoprolol,and Atorvastatin same dose.  Respiratory tract infection Because symptoms are persistent + risk factors for more serious infectious process, Doxycycline 100 mg bid x 7 d recommended. Some side effects discussed. We agree on holding on CXR for now. Benzonatate and plain Mucinex to help with cough. Instructed about warning signs.  -     doxycycline (VIBRA-TABS) 100 MG tablet; Take 1 tablet (100 mg total) by mouth 2 (two) times daily for 7 days.  Nocturia -     PSA(Must document that pt has been informed of limitations of PSA testing.)  COPD with acute exacerbation ([place]) Xopenex may be better tolerated. Xopenex inh 1-2 puff every 6 hours for a week then as needed for wheezing or shortness of breath.  Prednisone 40 mg x 3-5 days, side effects discussed.  -     levalbuterol (XOPENEX HFA) 45 MCG/ACT inhaler; Inhale 1-2 puffs into the lungs every 6 (six) hours  as needed for wheezing. -     predniSONE (DELTASONE) 20 MG tablet; Take 2 tablets (40 mg total) by mouth daily with breakfast for 5 days. -     benzonatate (TESSALON) 100 MG capsule; Take 1 capsule (100 mg total) by mouth 2 (two) times daily as needed for up to 10 days.  Return in 6 months (on 06/10/2022).  Terriona Horlacher G. [name], MD  [facility]. [place] office.

## 2021-12-08 ENCOUNTER — Ambulatory Visit (INDEPENDENT_AMBULATORY_CARE_PROVIDER_SITE_OTHER): Payer: Medicare HMO | Admitting: Family Medicine

## 2021-12-08 ENCOUNTER — Other Ambulatory Visit: Payer: Self-pay

## 2021-12-08 ENCOUNTER — Encounter: Payer: Self-pay | Admitting: Family Medicine

## 2021-12-08 VITALS — BP 130/82 | HR 64 | Temp 98.0°F | Resp 16 | Ht 67.5 in | Wt 182.0 lb

## 2021-12-08 DIAGNOSIS — Z0000 Encounter for general adult medical examination without abnormal findings: Secondary | ICD-10-CM

## 2021-12-08 DIAGNOSIS — J988 Other specified respiratory disorders: Secondary | ICD-10-CM

## 2021-12-08 DIAGNOSIS — I25119 Atherosclerotic heart disease of native coronary artery with unspecified angina pectoris: Secondary | ICD-10-CM | POA: Diagnosis not present

## 2021-12-08 DIAGNOSIS — I700 Atherosclerosis of aorta: Secondary | ICD-10-CM

## 2021-12-08 DIAGNOSIS — J441 Chronic obstructive pulmonary disease with (acute) exacerbation: Secondary | ICD-10-CM | POA: Diagnosis not present

## 2021-12-08 DIAGNOSIS — F17210 Nicotine dependence, cigarettes, uncomplicated: Secondary | ICD-10-CM

## 2021-12-08 DIAGNOSIS — I10 Essential (primary) hypertension: Secondary | ICD-10-CM

## 2021-12-08 DIAGNOSIS — Z87891 Personal history of nicotine dependence: Secondary | ICD-10-CM

## 2021-12-08 DIAGNOSIS — R351 Nocturia: Secondary | ICD-10-CM | POA: Diagnosis not present

## 2021-12-08 DIAGNOSIS — E785 Hyperlipidemia, unspecified: Secondary | ICD-10-CM

## 2021-12-08 DIAGNOSIS — Z122 Encounter for screening for malignant neoplasm of respiratory organs: Secondary | ICD-10-CM

## 2021-12-08 LAB — COMPREHENSIVE METABOLIC PANEL
ALT: 19 U/L (ref 0–53)
AST: 18 U/L (ref 0–37)
Albumin: 4.3 g/dL (ref 3.5–5.2)
Alkaline Phosphatase: 80 U/L (ref 39–117)
BUN: 10 mg/dL (ref 6–23)
CO2: 28 mEq/L (ref 19–32)
Calcium: 9.7 mg/dL (ref 8.4–10.5)
Chloride: 102 mEq/L (ref 96–112)
Creatinine, Ser: 0.89 mg/dL (ref 0.40–1.50)
GFR: 87.62 mL/min (ref >60.00)
Glucose, Bld: 94 mg/dL (ref 70–99)
Potassium: 4.9 mEq/L (ref 3.5–5.1)
Sodium: 138 mEq/L (ref 135–145)
Total Bilirubin: 0.9 mg/dL (ref 0.2–1.2)
Total Protein: 7.1 g/dL (ref 6.0–8.3)

## 2021-12-08 LAB — LIPID PANEL
Cholesterol: 111 mg/dL (ref 0–200)
HDL: 41.2 mg/dL (ref >39.00)
LDL Cholesterol: 51 mg/dL (ref 0–99)
NonHDL: 70.27
Total CHOL/HDL Ratio: 3
Triglycerides: 96 mg/dL (ref 0.0–149.0)
VLDL: 19.2 mg/dL (ref 0.0–40.0)

## 2021-12-08 LAB — PSA: PSA: 0.63 ng/mL (ref 0.10–4.00)

## 2021-12-08 MED ORDER — LEVALBUTEROL TARTRATE 45 MCG/ACT IN AERO: 1.0000 | INHALATION_SPRAY | Freq: Four times a day (QID) | RESPIRATORY_TRACT | 0 refills | Status: DC | PRN

## 2021-12-08 MED ORDER — PREDNISONE 20 MG PO TABS: 40.0000 mg | ORAL_TABLET | Freq: Every day | ORAL | 0 refills | Status: AC

## 2021-12-08 MED ORDER — DOXYCYCLINE HYCLATE 100 MG PO TABS: 100.0000 mg | ORAL_TABLET | Freq: Two times a day (BID) | ORAL | 0 refills | Status: AC

## 2021-12-08 MED ORDER — BENZONATATE 100 MG PO CAPS: 100.0000 mg | ORAL_CAPSULE | Freq: Two times a day (BID) | ORAL | 0 refills | Status: AC | PRN

## 2021-12-08 NOTE — Patient Instructions (Addendum)
A few things to remember from today's visit:  Routine general medical examination at a health care facility  Hyperlipidemia, unspecified hyperlipidemia type - Plan: Lipid panel, Comprehensive metabolic panel  Essential (primary) hypertension - Plan: Comprehensive metabolic panel  Atherosclerosis of aorta ([place]), Chronic - Plan: Lipid panel  Coronary artery disease involving native coronary artery of native heart with angina pectoris ([place]), Chronic  Respiratory tract infection - Plan: doxycycline (VIBRA-TABS) 100 MG tablet  Nocturia - Plan: PSA(Must document that pt has been informed of limitations of PSA testing.)  COPD with acute exacerbation ([place]) - Plan: levalbuterol (XOPENEX HFA) 45 MCG/ACT inhaler, predniSONE (DELTASONE) 20 MG tablet  If you need refills please call your pharmacy. Do not use My Chart to request refills or for acute issues that need immediate attention. Doxycycline 2 hours after taking prednisone to prevent gastrointestinal side effects. Prednisone with breakfast and antibiotic with a small snack. Xopenex can be better tolerated than albuterol. Xopenex inh 1-2 puff every 6 hours for a week then as needed for wheezing or shortness of breath.  If not better we can arrange chest X ray. Please schedule lung CT.   Preventive Care 41 Years and Older, Male Preventive care refers to lifestyle choices and visits with your health care provider that can promote health and wellness. Preventive care visits are also called wellness exams. What can I expect for my preventive care visit? Counseling During your preventive care visit, your health care provider may ask about your: Medical history, including: Past medical problems. Family medical history. History of falls. Current health, including: Emotional well-being. Home life and relationship well-being. Sexual activity. Memory and ability to understand (cognition). Lifestyle, including: Alcohol, nicotine or tobacco,  and drug use. Access to firearms. Diet, exercise, and sleep habits. Work and work Statistician. Sunscreen use. Safety issues such as seatbelt and bike helmet use. Physical exam Your health care provider will check your: Height and weight. These may be used to calculate your BMI (body mass index). BMI is a measurement that tells if you are at a healthy weight. Waist circumference. This measures the distance around your waistline. This measurement also tells if you are at a healthy weight and may help predict your risk of certain diseases, such as type 2 diabetes and high blood pressure. Heart rate and blood pressure. Body temperature. Skin for abnormal spots. What immunizations do I need?  Vaccines are usually given at various ages, according to a schedule. Your health care provider will recommend vaccines for you based on your age, medical history, and lifestyle or other factors, such as travel or where you work. What tests do I need? Screening Your health care provider may recommend screening tests for certain conditions. This may include: Lipid and cholesterol levels. Diabetes screening. This is done by checking your blood sugar (glucose) after you have not eaten for a while (fasting). Hepatitis C test. Hepatitis B test. HIV (human immunodeficiency virus) test. STI (sexually transmitted infection) testing, if you are at risk. Lung cancer screening. Colorectal cancer screening. Prostate cancer screening. Abdominal aortic aneurysm (AAA) screening. You may need this if you are a current or former smoker. Talk with your health care provider about your test results, treatment options, and if necessary, the need for more tests. Follow these instructions at home: Eating and drinking  Eat a diet that includes fresh fruits and vegetables, whole grains, lean protein, and low-fat dairy products. Limit your intake of foods with high amounts of sugar, saturated fats, and salt. Take vitamin  and  mineral supplements as recommended by your health care provider. Do not drink alcohol if your health care provider tells you not to drink. If you drink alcohol: Limit how much you have to 0-2 drinks a day. Know how much alcohol is in your drink. In the [place] one drink equals one 12 oz bottle of beer (355 mL), one 5 oz glass of wine (148 mL), or one 1 oz glass of hard liquor (44 mL). Lifestyle Brush your teeth every morning and night with fluoride toothpaste. Floss one time each day. Exercise for at least 30 minutes 5 or more days each week. Do not use any products that contain nicotine or tobacco. These products include cigarettes, chewing tobacco, and vaping devices, such as e-cigarettes. If you need help quitting, ask your health care provider. Do not use drugs. If you are sexually active, practice safe sex. Use a condom or other form of protection to prevent STIs. Take aspirin only as told by your health care provider. Make sure that you understand how much to take and what form to take. Work with your health care provider to find out whether it is safe and beneficial for you to take aspirin daily. Ask your health care provider if you need to take a cholesterol-lowering medicine (statin). Find healthy ways to manage stress, such as: Meditation, yoga, or listening to music. Journaling. Talking to a trusted person. Spending time with friends and family. Safety Always wear your seat belt while driving or riding in a vehicle. Do not drive: If you have been drinking alcohol. Do not ride with someone who has been drinking. When you are tired or distracted. While texting. If you have been using any mind-altering substances or drugs. Wear a helmet and other protective equipment during sports activities. If you have firearms in your house, make sure you follow all gun safety procedures. Minimize exposure to UV radiation to reduce your risk of skin cancer. What's next? Visit your health care  provider once a year for an annual wellness visit. Ask your health care provider how often you should have your eyes and teeth checked. Stay up to date on all vaccines. This information is not intended to replace advice given to you by your health care provider. Make sure you discuss any questions you have with your health care provider. Document Revised: 01/01/2021 Document Reviewed: 01/01/2021 Elsevier Patient Education  [place].  Please be sure medication list is accurate. If a new problem present, please set up appointment sooner than planned today.

## 2021-12-18 DIAGNOSIS — M5416 Radiculopathy, lumbar region: Secondary | ICD-10-CM | POA: Diagnosis not present

## 2021-12-23 DIAGNOSIS — M545 Low back pain, unspecified: Secondary | ICD-10-CM | POA: Diagnosis not present

## 2021-12-23 DIAGNOSIS — M5416 Radiculopathy, lumbar region: Secondary | ICD-10-CM | POA: Diagnosis not present

## 2021-12-31 ENCOUNTER — Other Ambulatory Visit (HOSPITAL_COMMUNITY): Payer: Self-pay | Admitting: Neurosurgery

## 2021-12-31 ENCOUNTER — Ambulatory Visit (HOSPITAL_COMMUNITY)
Admission: RE | Admit: 2021-12-31 | Discharge: 2021-12-31 | Disposition: A | Discharge status: Home / Self Care | Payer: Medicare HMO | Source: Ambulatory Visit | Attending: Neurosurgery | Admitting: Neurosurgery

## 2021-12-31 DIAGNOSIS — M79604 Pain in right leg: Secondary | ICD-10-CM

## 2021-12-31 DIAGNOSIS — M79605 Pain in left leg: Secondary | ICD-10-CM | POA: Diagnosis not present

## 2022-01-06 ENCOUNTER — Ambulatory Visit
Admission: RE | Admit: 2022-01-06 | Discharge: 2022-01-06 | Disposition: A | Discharge status: Home / Self Care | Payer: Medicare HMO | Source: Ambulatory Visit | Attending: Family Medicine | Admitting: Family Medicine

## 2022-01-06 DIAGNOSIS — I2510 Atherosclerotic heart disease of native coronary artery without angina pectoris: Secondary | ICD-10-CM | POA: Diagnosis not present

## 2022-01-06 DIAGNOSIS — Z87891 Personal history of nicotine dependence: Secondary | ICD-10-CM

## 2022-01-06 DIAGNOSIS — J432 Centrilobular emphysema: Secondary | ICD-10-CM | POA: Diagnosis not present

## 2022-01-06 DIAGNOSIS — F17210 Nicotine dependence, cigarettes, uncomplicated: Secondary | ICD-10-CM

## 2022-01-06 DIAGNOSIS — I700 Atherosclerosis of aorta: Secondary | ICD-10-CM | POA: Diagnosis not present

## 2022-01-06 DIAGNOSIS — Z122 Encounter for screening for malignant neoplasm of respiratory organs: Secondary | ICD-10-CM

## 2022-01-07 DIAGNOSIS — R69 Illness, unspecified: Secondary | ICD-10-CM | POA: Diagnosis not present

## 2022-01-07 DIAGNOSIS — Z7902 Long term (current) use of antithrombotics/antiplatelets: Secondary | ICD-10-CM | POA: Diagnosis not present

## 2022-01-07 DIAGNOSIS — K219 Gastro-esophageal reflux disease without esophagitis: Secondary | ICD-10-CM | POA: Diagnosis not present

## 2022-01-07 DIAGNOSIS — Z008 Encounter for other general examination: Secondary | ICD-10-CM | POA: Diagnosis not present

## 2022-01-07 DIAGNOSIS — I2510 Atherosclerotic heart disease of native coronary artery without angina pectoris: Secondary | ICD-10-CM | POA: Diagnosis not present

## 2022-01-07 DIAGNOSIS — Z833 Family history of diabetes mellitus: Secondary | ICD-10-CM | POA: Diagnosis not present

## 2022-01-07 DIAGNOSIS — Z809 Family history of malignant neoplasm, unspecified: Secondary | ICD-10-CM | POA: Diagnosis not present

## 2022-01-07 DIAGNOSIS — Z825 Family history of asthma and other chronic lower respiratory diseases: Secondary | ICD-10-CM | POA: Diagnosis not present

## 2022-01-07 DIAGNOSIS — Z7982 Long term (current) use of aspirin: Secondary | ICD-10-CM | POA: Diagnosis not present

## 2022-01-07 DIAGNOSIS — Z8249 Family history of ischemic heart disease and other diseases of the circulatory system: Secondary | ICD-10-CM | POA: Diagnosis not present

## 2022-01-07 DIAGNOSIS — R030 Elevated blood-pressure reading, without diagnosis of hypertension: Secondary | ICD-10-CM | POA: Diagnosis not present

## 2022-01-07 DIAGNOSIS — E785 Hyperlipidemia, unspecified: Secondary | ICD-10-CM | POA: Diagnosis not present

## 2022-01-07 DIAGNOSIS — G629 Polyneuropathy, unspecified: Secondary | ICD-10-CM | POA: Diagnosis not present

## 2022-01-08 ENCOUNTER — Other Ambulatory Visit: Payer: Self-pay | Admitting: Acute Care

## 2022-01-08 DIAGNOSIS — Z87891 Personal history of nicotine dependence: Secondary | ICD-10-CM

## 2022-01-08 DIAGNOSIS — Z122 Encounter for screening for malignant neoplasm of respiratory organs: Secondary | ICD-10-CM

## 2022-01-08 DIAGNOSIS — F17210 Nicotine dependence, cigarettes, uncomplicated: Secondary | ICD-10-CM

## 2022-01-15 DIAGNOSIS — M5416 Radiculopathy, lumbar region: Secondary | ICD-10-CM | POA: Diagnosis not present

## 2022-01-15 DIAGNOSIS — Z6828 Body mass index (BMI) 28.0-28.9, adult: Secondary | ICD-10-CM | POA: Diagnosis not present

## 2022-02-16 ENCOUNTER — Telehealth: Payer: Self-pay | Admitting: Pharmacist

## 2022-02-16 NOTE — Chronic Care Management (AMB) (Signed)
    Chronic Care Management Pharmacy Assistant   Name: Derrick Stokes  MRN: [id number] DOB: 1950/10/10  02/16/22 APPOINTMENT REMINDER   Patient is checked in and wife is aware to have all medications, supplements and any blood glucose and blood pressure readings available for review with Jeni Salles, Pharm. D, for telephone visit on 02/17/22 at 10:30.    Care Gaps: COVID Vaccine - Overdue Urine Micro - Due Soon Zoster Vaccine - Postponed AWV- 3/22 BP-  130/82 12/08/21 Lab Results  Component Value Date   HGBA1C 5.6 03/31/2021    Star Rating Drug: Atorvastatin 80 mg - Last filled 12/10/21 90 DS at [facility]    Medications: Outpatient Encounter Medications as of 02/16/2022  Medication Sig   aspirin EC 81 MG tablet Take 81 mg by mouth at bedtime.    atorvastatin (LIPITOR) 80 MG tablet Take 1 tablet (80 mg total) by mouth every morning.   clopidogrel (PLAVIX) 75 MG tablet TAKE ONE TABLET BY MOUTH EVERY MORNING   DULoxetine (CYMBALTA) 30 MG capsule Take 1 capsule (30 mg total) by mouth daily.   ezetimibe (ZETIA) 10 MG tablet Take 1 tablet (10 mg total) by mouth daily.   levalbuterol (XOPENEX HFA) 45 MCG/ACT inhaler Inhale 1-2 puffs into the lungs every 6 (six) hours as needed for wheezing.   metoprolol succinate (TOPROL-XL) 50 MG 24 hr tablet TAKE ONE TABLET BY MOUTH ONCE DAILY WITH A MEAL   nitroGLYCERIN (NITROSTAT) 0.4 MG SL tablet Place 1 tablet (0.4 mg total) under the tongue every 5 (five) minutes as needed for chest pain. PLACE 1 TABLET UNDER THE TONGUE EVERY 5 MINUTES X 3 DOSES AS NEEDED FOR CHEST PAIN.   Omega-3 Fatty Acids (FISH OIL) 1000 MG CPDR Take 1,000 mg by mouth daily.    pantoprazole (PROTONIX) 40 MG tablet Take 1 tablet (40 mg total) by mouth daily.   vitamin B-12 (CYANOCOBALAMIN) 1000 MCG tablet Take 1,000 mcg by mouth daily.    No facility-administered encounter medications on file as of 02/16/2022.      [place] Clinical Pharmacist  Assistant [phone]

## 2022-02-17 ENCOUNTER — Ambulatory Visit (INDEPENDENT_AMBULATORY_CARE_PROVIDER_SITE_OTHER): Payer: Medicare HMO | Admitting: Pharmacist

## 2022-02-17 ENCOUNTER — Telehealth: Payer: Self-pay

## 2022-02-17 DIAGNOSIS — I10 Essential (primary) hypertension: Secondary | ICD-10-CM

## 2022-02-17 DIAGNOSIS — E785 Hyperlipidemia, unspecified: Secondary | ICD-10-CM

## 2022-02-17 NOTE — Progress Notes (Signed)
Chronic Care Management Pharmacy Note  02/26/2022 Name:  Derrick Stokes MRN:  [id number] DOB:  08/20/1950  Summary: Pt continues to drink and smoke but is more willing to quit smoking Pt's wife bought a new BP cuff but they haven't started using it  Recommendations/Changes made from today's visit: -Recommended adding in more water throughout the day -Recommended thinking about reasons to quit smoking and finding motivation to do so -Recommended bringing BP cuff to next office visit to ensure accuracy  Plan: BP assessment in 2 months Follow up in 4-5 months  Subjective: Derrick Stokes is an 71 y.o. year old male who is a primary patient of [name], Malka So, Stokes.  The CCM team was consulted for assistance with disease management and care coordination needs.    Engaged with patient by telephone for follow up visit in response to provider referral for pharmacy case management and/or care coordination services.   Consent to Services:  The patient was given information about Chronic Care Management services, agreed to services, and gave verbal consent prior to initiation of services.  Please see initial visit note for detailed documentation.   Patient Care Team: Derrick Stokes as PCP - General (Family Medicine) Sherren Mocha, Stokes as PCP - Cardiology (Cardiology) Viona Gilmore, [facility] as Pharmacist (Pharmacist)  Recent office visits: 12/08/21 Betty [name], Stokes: Patient presented for annual exam and cough and nasal congestion. Prescribed doxycycline x 7 days, prednisone x 5 days, benzonatate x 10 days and Xopenex PRN. Follow up in 6 months.  09/02/21 Derrick Stokes: Patient presented for AWV.  Recent consult visits: 12/23/21 Derrick Stokes (neurosurgery): Patient presented for radiculopathy. Unable to access notes.  12/04/21 Derrick Stokes - Patient presented for Tobacco abuse and other concerns. Changed Nitroglycerin 0.4 mg. Stopped Ondansetron 8 mg. Stopped  Metoprolol Tartrate.   10-07-2020 Sherren Mocha, Stokes (Cardiology) - Patient presented for Essential hypertension and other issues. No medication changes.    10-01-2020 Derrick Stokes (Audiology) - Patient presented for hearing loss and other issues. No medication changes found.  Hospital visits: Medication Reconciliation was completed by comparing discharge summary, patient's EMR and Pharmacy list, and upon discussion with patient.   Patient presented to [facility] on 10-16-2020 due to cath procedure. He was there for 5 hours   New?Medications Started at [facility] Discharge:?? -started None   Medication Changes at Hospital Discharge: -Changed None   Medications Discontinued at Hospital Discharge: -Stopped None   Medications that remain the same after Hospital Discharge:??  -All other medications will remain the same.     Objective:  Lab Results  Component Value Date   CREATININE 0.89 12/08/2021   BUN 10 12/08/2021   GFR 87.62 12/08/2021   GFRNONAA 95 03/08/2019   GFRAA 109 03/08/2019   NA 138 12/08/2021   K 4.9 12/08/2021   CALCIUM 9.7 12/08/2021   CO2 28 12/08/2021   GLUCOSE 94 12/08/2021    Lab Results  Component Value Date/Time   HGBA1C 5.6 03/31/2021 09:30 AM   HGBA1C 5.3 09/14/2018 08:06 AM   GFR 87.62 12/08/2021 08:03 AM   GFR 90.92 03/31/2021 09:30 AM   MICROALBUR <0.7 03/31/2021 09:30 AM    Last diabetic Eye exam: No results found for: "HMDIABEYEEXA"  Last diabetic Foot exam: No results found for: "HMDIABFOOTEX"   Lab Results  Component Value Date   CHOL 111 12/08/2021   HDL 41.20 12/08/2021   LDLCALC 51 12/08/2021   LDLDIRECT 68.0 11/19/2014  TRIG 96.0 12/08/2021   CHOLHDL 3 12/08/2021       Latest Ref Rng & Units 12/08/2021    8:03 AM 03/31/2021    9:30 AM 02/05/2020    2:17 PM  Hepatic Function  Total Protein 6.0 - 8.3 g/dL 7.1  6.9  7.0   Albumin 3.5 - 5.2 g/dL 4.3  4.2    AST 0 - 37 U/L _0 ALT 0 - 53 U/L 19  24   36   Alk Phosphatase 39 - 117 U/L 80  66    Total Bilirubin 0.2 - 1.2 mg/dL 0.9  1.0  1.0     Lab Results  Component Value Date/Time   TSH 1.75 03/31/2021 09:30 AM   TSH 3.180 09/16/2014 06:15 PM       Latest Ref Rng & Units 10/07/2020    9:30 AM 02/05/2020    2:17 PM 12/29/2018   10:34 AM  CBC  WBC 3.4 - 10.8 x10E3/uL 7.0  8.5  7.0   Hemoglobin 13.0 - 17.7 g/dL 16.5  16.2  15.7   Hematocrit 37.5 - 51.0 % 47.8  47.5  45.4   Platelets 150 - 450 x10E3/uL 261  245  230     No results found for: "VD25OH"  Clinical ASCVD: Yes  The ASCVD Risk score (Arnett DK, et al., 2019) failed to calculate for the following reasons:   The valid total cholesterol range is 130 to 320 mg/dL       09/02/2021   10:26 AM 06/09/2021   11:27 AM 09/20/2020    7:32 AM  Depression screen PHQ 2/9  Decreased Interest 0 0 0  Down, Depressed, Hopeless 0 0 0  PHQ - 2 Score 0 0 0      Social History   Tobacco Use  Smoking Status Every Day   Packs/day: 1.00   Years: 51.00   Total pack years: 51.00   Types: Cigarettes   Start date: 1970   Passive exposure: Past  Smokeless Tobacco Never   BP Readings from Last 3 Encounters:  12/08/21 130/82  12/04/21 120/82  09/02/21 130/78   Pulse Readings from Last 3 Encounters:  12/08/21 64  12/04/21 (!) 59  09/02/21 65   Wt Readings from Last 3 Encounters:  12/08/21 182 lb (82.6 kg)  12/04/21 182 lb (82.6 kg)  09/02/21 182 lb 3.2 oz (82.6 kg)   BMI Readings from Last 3 Encounters:  12/08/21 28.08 kg/m  12/04/21 27.67 kg/m  09/02/21 28.12 kg/m    Assessment/Interventions: Review of patient past medical history, allergies, medications, health status, including review of consultants reports, laboratory and other test data, was performed as part of comprehensive evaluation and provision of chronic care management services.   SDOH:  (Social Determinants of Health) assessments and interventions performed: Yes   SDOH Screenings   Alcohol Screen:  Not on file  Depression (PHQ2-9): Low Risk  (09/02/2021)   Depression (PHQ2-9)    PHQ-2 Score: 0  Financial Resource Strain: Medium Risk (09/03/2021)   Overall Financial Resource Strain (CARDIA)    Difficulty of Paying Living Expenses: Somewhat hard  Food Insecurity: No Food Insecurity (09/02/2021)   Hunger Vital Sign    Worried About Running Out of Food in the Last Year: Never true    Ran Out of Food in the Last Year: Never true  Housing: Not on file  Physical Activity: Inactive (09/02/2021)   Exercise Vital Sign    Days of Exercise  per Week: 0 days    Minutes of Exercise per Session: 0 min  Social Connections: Not on file  Stress: Stress Concern Present (09/02/2021)   [place]    Feeling of Stress : Rather much  Tobacco Use: High Risk (02/26/2022)   Patient History    Smoking Tobacco Use: Every Day    Smokeless Tobacco Use: Never    Passive Exposure: Past  Transportation Needs: No Transportation Needs (09/02/2021)   PRAPARE - Transportation    Lack of Transportation (Medical): No    Lack of Transportation (Non-Medical): No     CCM Care Plan  Allergies  Allergen Reactions   Iodine Nausea And Vomiting    Turns white in face and sweaty   Iohexol Other (See Comments)    May have caused nausea and vomiting several years ago    Shrimp [Shellfish Allergy] Nausea And Vomiting    Turns white in face and sweaty   Isosorbide Dinitrate Other (See Comments)    headache    Medications Reviewed Today     Reviewed by Derrick Stokes (Physician) on 12/08/21 at 0736  Med List Status: <None>   Medication Order Taking? Sig Documenting Provider Last Dose Status Informant  aspirin EC 81 MG tablet [id number] Yes Take 81 mg by mouth at bedtime.  Provider, Historical, Stokes Taking Active Spouse/Significant Other  atorvastatin (LIPITOR) 80 MG tablet [id number] Yes Take 1 tablet (80 mg total) by mouth every morning. [place],  Lanice Schwab, Stokes Taking Active   clopidogrel (PLAVIX) 75 MG tablet [id number] Yes TAKE ONE TABLET BY MOUTH EVERY MORNING Derrick Stokes Taking Active   DULoxetine (CYMBALTA) 30 MG capsule [id number] Yes Take 1 capsule (30 mg total) by mouth daily. Derrick Stokes Taking Active   ezetimibe (ZETIA) 10 MG tablet [id number] Yes Take 1 tablet (10 mg total) by mouth daily. Derrick Stokes Taking Active   metoprolol succinate (TOPROL-XL) 50 MG 24 hr tablet [id number] Yes TAKE ONE TABLET BY MOUTH ONCE DAILY WITH A MEAL Derrick Stokes Taking Active   nitroGLYCERIN (NITROSTAT) 0.4 MG SL tablet [id number] Yes Place 1 tablet (0.4 mg total) under the tongue every 5 (five) minutes as needed for chest pain. PLACE 1 TABLET UNDER THE TONGUE EVERY 5 MINUTES X 3 DOSES AS NEEDED FOR CHEST PAIN. Derrick Stokes Taking Active   Omega-3 Fatty Acids (FISH OIL) 1000 MG CPDR [id number] Yes Take 1,000 mg by mouth daily.  Provider, Historical, Stokes Taking Active Spouse/Significant Other  pantoprazole (PROTONIX) 40 MG tablet [id number] Yes Take 1 tablet (40 mg total) by mouth daily. Derrick Stokes Taking Active   vitamin B-12 (CYANOCOBALAMIN) 1000 MCG tablet [id number] Yes Take 1,000 mcg by mouth daily.  Provider, Historical, Stokes Taking Active Spouse/Significant Other            Patient Active Problem List   Diagnosis Date Noted   Atherosclerosis of aorta ([place]) 06/09/2021   Essential (primary) hypertension 06/09/2021   Regurgitation of food 02/18/2020   Barrett's esophagus without dysplasia 02/18/2020   Bloating 02/18/2020   History of adenomatous polyp of colon 02/18/2020   Change in bowel habits 02/18/2020   Hiatal hernia 02/18/2020   Claudication ([place]) 09/18/2019   Coronary artery disease involving native coronary artery of native heart with angina pectoris ([place]) 09/18/2019   Chest pain wtih stable CAD at cath.   03/18/2018   Reactive airway disease 03/18/2018  Coronary artery  disease with unstable angina pectoris ([place]) 03/16/2018   Reactive airway disease that is not asthma, has been coughing more, may be cause of chest pain 09/08/2016   B12 deficiency 06/08/2016   GERD (gastroesophageal reflux disease) 06/08/2016   Chronic bilateral low back pain with sciatica 06/08/2016   Peripheral neuropathic pain 06/08/2016   CAD (coronary artery disease), native coronary artery 09/17/2014   Unstable angina (HCC) 09/16/2014   Tobacco abuse 01/03/2014   S/P coronary artery bypass graft x 5 03/06/2011   Hyperlipidemia 30/16/0109   HELICOBACTER PYLORI INFECTION 11/05/2008   BARRETTS ESOPHAGUS 11/05/2008   HIATAL HERNIA 11/05/2008   DIVERTICULOSIS, COLON 11/05/2008   COLONIC POLYPS, HX OF 11/05/2008   GASTRITIS 10/22/2008   Anxiety state 10/12/2008   EARLY SATIETY 10/12/2008   CHANGE IN BOWELS 10/12/2008   ABDOMINAL PAIN -GENERALIZED 10/12/2008   ABDOMINAL PAIN, LEFT UPPER QUADRANT 10/01/2008    Immunization History  Administered Date(s) Administered   Fluad Quad(high Dose 65+) 03/31/2021   Influenza, High Dose Seasonal PF 09/14/2018, 04/25/2019, 04/23/2020   Influenza,inj,Quad PF,6+ Mos 04/17/2014, 05/06/2015, 06/08/2016   Influenza,inj,Quad PF,6-35 Mos 04/25/2019   Moderna Sars-Covid-2 Vaccination 08/15/2019, 09/12/2019   Pneumococcal Conjugate-13 09/14/2018   Pneumococcal Polysaccharide-23 06/08/2016, 06/09/2021   Tdap 05/01/2014   Tetanus 04/17/2014   Zoster, Live 05/01/2014   Spoke with patient's wife for the call as she manages his medications. Patient had a wellness nurse come out to their house a couple of weeks ago and checked his BP which was 120/70. Patient's wife bought a new cuff and machine and they haven't even taken it out of the box.   Patient's daughter is through with treatment for lung cancer and no doctors will say she is in remission but the outcome is looking good. Patient's daughter continues to smoke despite having just had lung  cancer.  Patient still wants to quit and had a conversation about it yesterday. His annual lung CT scan was clear and patient's wife reports they have both quit before so it's about finding the motivation to do so now.  Patient's wife took him to Dr. Trenton Gammon and he was evaluated with an MRI. He saw some blockages to main arteries to legs and this could be related to his pain. They discussed the tremor with Dr. Trenton Gammon but his wife thinks the tremor comes when he needs another drink. He is not drinking any more water. Patient's wife will remind him to drink more water and she is keeping more water around the house. He stopped smoking after workshop in the afternoon and not smoking in the afternoon.   Conditions to be addressed/monitored:  Hyperlipidemia, Coronary Artery Disease, GERD, and Tobacco use  Conditions addressed this visit: Tobacco use, hyperlipidemia  Care Plan : [place]  Updates made by Viona Gilmore, [place] since 02/26/2022 12:00 AM     Problem: Problem: Hyperlipidemia, Coronary Artery Disease, GERD, and Tobacco use      Long-Range Goal: Patient-Specific Goal   Start Date: 02/28/2021  Expected End Date: 02/28/2022  Recent Progress: On track  Priority: High  Note:   Current Barriers:  Unable to independently afford treatment regimen Unable to independently monitor therapeutic efficacy  Pharmacist Clinical Goal(s):  Patient will verbalize ability to afford treatment regimen achieve adherence to monitoring guidelines and medication adherence to achieve therapeutic efficacy through collaboration with PharmD and provider.   Interventions: 1:1 collaboration with Derrick Stokes regarding development and update of comprehensive plan of care  as evidenced by provider attestation and co-signature Inter-disciplinary care team collaboration (see longitudinal plan of care) Comprehensive medication review performed; medication list updated in electronic medical  record  Hyperlipidemia: (LDL goal < 70) -Controlled -Current treatment: Atorvastatin 80 mg 1 tablet daily - Appropriate, Effective, Safe, Accessible Fish oil 1200 mg  1 capsule daily - Appropriate, Effective, Safe, Accessible Zetia 10 mg 1 tablet daily - Appropriate, Effective, Safe, Accessible -Medications previously tried: none -Current dietary patterns: eating only breakfast most days -Current exercise habits: active during the day; no formal exercise -Educated on Cholesterol goals;  Benefits of statin for ASCVD risk reduction; Importance of limiting foods high in cholesterol; Exercise goal of 150 minutes per week; -Counseled on diet and exercise extensively Recommended to continue current medication Consider stopping fish oil at follow up given recent lipid panel.  CAD (Goal: prevent heart events) -Controlled -Current treatment  Aspirin 81 mg 1 tablet daily - Appropriate, Effective, Safe, Accessible Atorvastatin 80 mg 1 tablet daily - Appropriate, Effective, Safe, Accessible Clopidogrel 75 mg 1 tablet daily with breakfast - Appropriate, Effective, Safe, Accessible Nitroglycerin 0.4 mg SL as needed - Appropriate, Effective, Safe, Accessible Metoprolol succinate 50 mg 1 tablet daily - Appropriate, Effective, Safe, Accessible -Medications previously tried: none  -Current BP readings: 133/92, 137/102 (bought new arm cuff) -Recommended to continue current medication Counseled on verifying expiration date of nitroglycerin and having a non expired on hand supply. Recommended checking blood pressure weekly with use of metoprolol and bringing cuff to next in office appointment to verify accuracy.  Tobacco use (Goal quit smoking) -Uncontrolled -Previous quit attempts: cold [place] -Current treatment  No medications -Patient smokes Within 30 minutes of waking -Patient triggers include: stress and pain -On a scale of 1-10, reports MOTIVATION to quit is 7 -On a scale of 1-10, reports  CONFIDENCE in quitting is 7 -Provided contact information for  Quit Line (1-800-QUIT-NOW) and encouraged patient to reach out to this group for support. -Recommended thinking about reasons to quit smoking and finding motivation to do so.  GERD/Barrett's esophagus (Goal: minimize further damage to esophagus) -Controlled -Current treatment  Pantoprazole 40 mg 1 tablet daily - Appropriate, Effective, Safe, Accessible -Medications previously tried: none  -Counseled on non-pharmacologic management of symptoms such as elevating the head of your bed, avoiding eating 2-3 hours before bed, avoiding triggering foods such as acidic, spicy, or fatty foods, eating smaller meals, and wearing clothes that are loose around the waist  Educated on smoking cessation will prevent further damage to esophagus.  Health Maintenance -Vaccine gaps: shingrix, COVID booster -Current therapy:  Ondansetron 8 mg as needed Vitamin B12 1000 mcg 1 tablet daily -Educated on Cost vs benefit of each product must be carefully weighed by individual consumer -Patient is satisfied with current therapy and denies issues -Recommended to continue current medication  Patient Goals/Self-Care Activities Patient will:  - focus on medication adherence by moving all medications to be administered at the same time. check blood pressure weekly, document, and provide at future appointments target a minimum of 150 minutes of moderate intensity exercise weekly  Follow Up Plan: Telephone follow up appointment with care management team member scheduled for: 4-5 months      Medication Assistance: None required.  Patient affirms current coverage meets needs.  Compliance/Adherence/Medication fill history: Care Gaps: Shingrix, COVID booster, influenza vaccine BP - 130/82 12/08/21  Star-Rating Drugs: Atorvastatin 80 mg - Last filled 12/10/21 90 DS at [facility]  Patient's preferred pharmacy is:  [place], [place] -  [address] [place] [address] Phone: [phone] Fax: [phone]   Uses pill box? No - adherence packaging Pt endorses 99% compliance   We discussed: Benefits of medication synchronization, packaging and delivery as well as enhanced pharmacist oversight with [facility]. Patient decided to: Utilize [facility] pharmacy for medication synchronization, packaging and delivery  Care Plan and Follow Up Patient Decision:  Patient agrees to Care Plan and Follow-up.  Plan: Face to Face appointment with care management team member scheduled for: 5-6 months  Jeni Salles, PharmD, [place] Pharmacist [place] [facility] [phone]

## 2022-02-17 NOTE — Telephone Encounter (Signed)
-----   Message from Viona Gilmore, [facility] sent at 02/17/2022  8:49 AM EDT ----- Regarding: CCM referral Hi,  When you get the chance, can you put in a CCM referral for Mr. Coil?  Thanks! Maddie

## 2022-02-26 ENCOUNTER — Encounter: Payer: Self-pay | Admitting: Pharmacist

## 2022-02-26 NOTE — Patient Instructions (Signed)
Hi Tim,  It was great to catch up with you again!  Please reach out to me if you have any questions or need anything before our follow up!  Best, Maddie  Jeni Salles, PharmD, [place] Pharmacist [facility]   Visit Information   Goals Addressed   None    Patient Care Plan: CCM Pharmacy Care Plan     Problem Identified: Problem: Hyperlipidemia, Coronary Artery Disease, GERD, and Tobacco use      Long-Range Goal: Patient-Specific Goal   Start Date: 02/28/2021  Expected End Date: 02/28/2022  Recent Progress: On track  Priority: High  Note:   Current Barriers:  Unable to independently afford treatment regimen Unable to independently monitor therapeutic efficacy  Pharmacist Clinical Goal(s):  Patient will verbalize ability to afford treatment regimen achieve adherence to monitoring guidelines and medication adherence to achieve therapeutic efficacy through collaboration with PharmD and provider.   Interventions: 1:1 collaboration with [name], [name], MD regarding development and update of comprehensive plan of care as evidenced by provider attestation and co-signature Inter-disciplinary care team collaboration (see longitudinal plan of care) Comprehensive medication review performed; medication list updated in electronic medical record  Hyperlipidemia: (LDL goal < 70) -Controlled -Current treatment: Atorvastatin 80 mg 1 tablet daily - Appropriate, Effective, Safe, Accessible Fish oil 1200 mg  1 capsule daily - Appropriate, Effective, Safe, Accessible Zetia 10 mg 1 tablet daily - Appropriate, Effective, Safe, Accessible -Medications previously tried: none -Current dietary patterns: eating only breakfast most days -Current exercise habits: active during the day; no formal exercise -Educated on Cholesterol goals;  Benefits of statin for ASCVD risk reduction; Importance of limiting foods high in cholesterol; Exercise goal of  150 minutes per week; -Counseled on diet and exercise extensively Recommended to continue current medication Consider stopping fish oil at follow up given recent lipid panel.  CAD (Goal: prevent heart events) -Controlled -Current treatment  Aspirin 81 mg 1 tablet daily - Appropriate, Effective, Safe, Accessible Atorvastatin 80 mg 1 tablet daily - Appropriate, Effective, Safe, Accessible Clopidogrel 75 mg 1 tablet daily with breakfast - Appropriate, Effective, Safe, Accessible Nitroglycerin 0.4 mg SL as needed - Appropriate, Effective, Safe, Accessible Metoprolol succinate 50 mg 1 tablet daily - Appropriate, Effective, Safe, Accessible -Medications previously tried: none  -Current BP readings: 133/92, 137/102 (bought new arm cuff) -Recommended to continue current medication Counseled on verifying expiration date of nitroglycerin and having a non expired on hand supply. Recommended checking blood pressure weekly with use of metoprolol and bringing cuff to next in office appointment to verify accuracy.  Tobacco use (Goal quit smoking) -Uncontrolled -Previous quit attempts: cold [place] -Current treatment  No medications -Patient smokes Within 30 minutes of waking -Patient triggers include: stress and pain -On a scale of 1-10, reports MOTIVATION to quit is 7 -On a scale of 1-10, reports CONFIDENCE in quitting is 7 -Provided contact information for [place] Quit Line (1-800-QUIT-NOW) and encouraged patient to reach out to this group for support. -Recommended thinking about reasons to quit smoking and finding motivation to do so.  GERD/Barrett's esophagus (Goal: minimize further damage to esophagus) -Controlled -Current treatment  Pantoprazole 40 mg 1 tablet daily - Appropriate, Effective, Safe, Accessible -Medications previously tried: none  -Counseled on non-pharmacologic management of symptoms such as elevating the head of your bed, avoiding eating 2-3 hours before bed, avoiding triggering  foods such as acidic, spicy, or fatty foods, eating smaller meals, and wearing clothes that are loose around the waist  Educated on  smoking cessation will prevent further damage to esophagus.  Health Maintenance -Vaccine gaps: shingrix, COVID booster -Current therapy:  Ondansetron 8 mg as needed Vitamin B12 1000 mcg 1 tablet daily -Educated on Cost vs benefit of each product must be carefully weighed by individual consumer -Patient is satisfied with current therapy and denies issues -Recommended to continue current medication  Patient Goals/Self-Care Activities Patient will:  - focus on medication adherence by moving all medications to be administered at the same time. check blood pressure weekly, document, and provide at future appointments target a minimum of 150 minutes of moderate intensity exercise weekly  Follow Up Plan: Telephone follow up appointment with care management team member scheduled for: 4-5 months       Patient verbalizes understanding of instructions and care plan provided today and agrees to view in [facility]. Active MyChart status and patient understanding of how to access instructions and care plan via MyChart confirmed with patient.    Telephone follow up appointment with pharmacy team member scheduled for:  Viona Gilmore, [facility]

## 2022-03-05 ENCOUNTER — Telehealth: Payer: Self-pay | Admitting: Pharmacist

## 2022-03-05 NOTE — Progress Notes (Addendum)
Chronic Care Management Pharmacy Assistant   Name: Derrick Stokes  MRN: [id number] DOB: May 11, 1951  Reason for Encounter: Medication Review Medication Coordination    Recent office visits:  None  Recent consult visits:  None  Hospital visits:  None in previous 6 months  Medications: Outpatient Encounter Medications as of 03/05/2022  Medication Sig   aspirin EC 81 MG tablet Take 81 mg by mouth at bedtime.    atorvastatin (LIPITOR) 80 MG tablet Take 1 tablet (80 mg total) by mouth every morning.   clopidogrel (PLAVIX) 75 MG tablet TAKE ONE TABLET BY MOUTH EVERY MORNING   DULoxetine (CYMBALTA) 30 MG capsule Take 1 capsule (30 mg total) by mouth daily.   ezetimibe (ZETIA) 10 MG tablet Take 1 tablet (10 mg total) by mouth daily.   levalbuterol (XOPENEX HFA) 45 MCG/ACT inhaler Inhale 1-2 puffs into the lungs every 6 (six) hours as needed for wheezing.   metoprolol succinate (TOPROL-XL) 50 MG 24 hr tablet TAKE ONE TABLET BY MOUTH ONCE DAILY WITH A MEAL   nitroGLYCERIN (NITROSTAT) 0.4 MG SL tablet Place 1 tablet (0.4 mg total) under the tongue every 5 (five) minutes as needed for chest pain. PLACE 1 TABLET UNDER THE TONGUE EVERY 5 MINUTES X 3 DOSES AS NEEDED FOR CHEST PAIN.   Omega-3 Fatty Acids (FISH OIL) 1000 MG CPDR Take 1,000 mg by mouth daily.    pantoprazole (PROTONIX) 40 MG tablet Take 1 tablet (40 mg total) by mouth daily.   vitamin B-12 (CYANOCOBALAMIN) 1000 MCG tablet Take 1,000 mcg by mouth daily.    No facility-administered encounter medications on file as of 03/05/2022.   Reviewed chart for medication changes ahead of medication coordination call.  BP Readings from Last 3 Encounters:  12/08/21 130/82  12/04/21 120/82  09/02/21 130/78    Lab Results  Component Value Date   HGBA1C 5.6 03/31/2021     Patient obtains medications through Adherence Packaging  90 DS   Last adherence delivery included: Duloxetine (Cymbalta) 30 mg: take one capsule at  breakfast Ezetimibe (Zetia) 10 mg: take one tablet at breakfast Pantoprazole (Protonix) 40 mg: take one tablet at breakfast Atorvastatin (Lipitor)  80 mg: take one tablet at breakfast Clopidogrel (Plavix) 75 mg : take one tablet at breakfast Metoprolol Succinate (Toprol)  50 mg : take one tablet at breakfast   Patient needs refills for 90 DS per wife. 90 DS were sent over to the pharmacy for all but Pantoprazole requested from Cardiology also requested 90 DS for his Duloxetine from [facility]   Confirmed delivery date of 12/17/21, advised patient that pharmacy will contact them the morning of delivery.   Patient is due for next adherence delivery on: 03/17/22. Called patient and reviewed medications and coordinated delivery. Packs 90 DS  This delivery to include: Duloxetine (Cymbalta) 30 mg: take one capsule at breakfast Ezetimibe (Zetia) 10 mg: take one tablet at breakfast Pantoprazole (Protonix) 40 mg: take one tablet at breakfast Atorvastatin (Lipitor)  80 mg: take one tablet at breakfast Clopidogrel (Plavix) 75 mg : take one tablet at breakfast Metoprolol Succinate (Toprol)  50 mg : take one tablet at breakfast Fish oil 1000 mg: take one capsule at breakfast  Patient declined the following medications  Levalbuterol Wife declines needing  Patient needs refills for : Wife inquired for cost of Fish oil 1000 mg once daily advised per pharmacy is 10.00 she reports she is ok with the cost would like added to his order in Breakfast pack.  Confirmed delivery date of 03/17/22, advised patient that pharmacy will contact them the morning of delivery.   Care Gaps: COVID Booster - Overdue Flu Vaccine - Overdue Zoster Vaccine - Postponed AWV- 3/22 BP-  130/82 12/08/21  Star Rating Drugs: Atorvastatin 80 mg - Last filled 12/10/21 90 DS at [facility] Pharmacist Assistant [phone]

## 2022-03-19 DIAGNOSIS — F17210 Nicotine dependence, cigarettes, uncomplicated: Secondary | ICD-10-CM

## 2022-03-19 DIAGNOSIS — E785 Hyperlipidemia, unspecified: Secondary | ICD-10-CM

## 2022-03-19 DIAGNOSIS — I2510 Atherosclerotic heart disease of native coronary artery without angina pectoris: Secondary | ICD-10-CM

## 2022-03-19 DIAGNOSIS — R69 Illness, unspecified: Secondary | ICD-10-CM | POA: Diagnosis not present

## 2022-04-20 ENCOUNTER — Ambulatory Visit: Payer: Medicare HMO | Admitting: Family Medicine

## 2022-05-31 ENCOUNTER — Other Ambulatory Visit: Payer: Self-pay | Admitting: Nurse Practitioner

## 2022-06-22 NOTE — Progress Notes (Unsigned)
HPI: Derrick Stokes is a 71 y.o. male  peripheral neuropathy, CAD s/p CABG x 5, b12 def,chronic back pain,HLD, COPD,tobacco use,and GERD here today with his wife for follow up. He was last seen on 12/08/21 for his CPE.  Today he is reporting worsening sense of balance and back pain radiating to LE's.He experiences difficulty standing and has to sidestep at times.  + LE numbness in his legs down to feet, constant. Symptoms alleviated by leaning against shopping cart when groceries shopping.  Negative for saddle anesthesia or bowel/bladder dysfunction.  These symptoms have been going on for many years. + Decreased sensation on anterior aspect of right thigh, chronic and stable. He has seen neurosurgeon Dr. Annette Stable, who performed imaging, according to pt's wife no significant abnormalities, not sure about next follow up appt.  Hypertension:  Medications: Metoprolol Succinate 50 mg daily Negative for unusual or severe headache, visual changes, focal weakness, or edema.  He experienced two episodes of chest pain on his left side, radiating to his left arm, while sitting in his shop. Each episode lasted only a few seconds, and he did not experience shortness of breath, palpitations, diaphoresis, or feeling like he was going to pass out.  He keeps nitroglycerin with him but did not use it for these episodes. Cardiac cath in 09/2020: Multivessel coronary artery disease with chronic total occlusion of the proximal LAD, continued patency with moderate in-stent restenosis in the left circumflex, wide patency of the first OM branch, and patency of the dominant RCA with mild nonobstructive in-stent restenosis.  Lab Results  Component Value Date   CREATININE 0.89 12/08/2021   BUN 10 12/08/2021   NA 138 12/08/2021   K 4.9 12/08/2021   CL 102 12/08/2021   CO2 28 12/08/2021   COPD: He continues to experience wheezing and coughing, exacerbated by working outdoors during cold weather. He reports that  the coughing stops when he is in a warm environment.  He is using Xopenex inh as needed and helps with symptoms. + Smoker. Lung cancer screening last done 12/2021.  Review of Systems  Constitutional:  Positive for fatigue. Negative for appetite change, chills and fever.  HENT:  Negative for congestion and sore throat.   Gastrointestinal:  Negative for abdominal pain, nausea and vomiting.  Endocrine: Negative for cold intolerance and heat intolerance.  Genitourinary:  Negative for decreased urine volume and dysuria.  Skin:  Negative for rash.  Neurological:  Negative for syncope and facial asymmetry.  See other pertinent positives and negatives in HPI.  Current Outpatient Medications on File Prior to Visit  Medication Sig Dispense Refill   aspirin EC 81 MG tablet Take 81 mg by mouth at bedtime.      atorvastatin (LIPITOR) 80 MG tablet Take 1 tablet (80 mg total) by mouth every morning. 90 tablet 3   clopidogrel (PLAVIX) 75 MG tablet TAKE ONE TABLET BY MOUTH EVERY MORNING 90 tablet 3   ezetimibe (ZETIA) 10 MG tablet Take 1 tablet (10 mg total) by mouth daily. 90 tablet 3   levalbuterol (XOPENEX HFA) 45 MCG/ACT inhaler Inhale 1-2 puffs into the lungs every 6 (six) hours as needed for wheezing. 1 each 0   metoprolol succinate (TOPROL-XL) 50 MG 24 hr tablet TAKE ONE TABLET BY MOUTH ONCE DAILY WITH A MEAL 90 tablet 3   nitroGLYCERIN (NITROSTAT) 0.4 MG SL tablet Place 1 tablet (0.4 mg total) under the tongue every 5 (five) minutes as needed for chest pain. PLACE 1 TABLET UNDER  THE TONGUE EVERY 5 MINUTES X 3 DOSES AS NEEDED FOR CHEST PAIN. 25 tablet 3   Omega-3 Fatty Acids (FISH OIL) 1000 MG CPDR Take 1,000 mg by mouth daily.      pantoprazole (PROTONIX) 40 MG tablet TAKE ONE TABLET BY MOUTH ONCE DAILY 90 tablet 1   vitamin B-12 (CYANOCOBALAMIN) 1000 MCG tablet Take 1,000 mcg by mouth daily.      No current facility-administered medications on file prior to visit.   Past Medical History:   Diagnosis Date   Arthritis    B12 deficiency    Borderline   CAD (coronary artery disease)    a. s/p CABG 01/2011;  b. ETT Myoview 3/14:  Low risk;  c. 09/2014 Cath/PCI: LM nl, LAD 100p, LCX 35mOM2 60-70p (2.75x28 Synergy DES), RCA dom, 874matherectomy, 3.5x24 Synergy DES), PDA nl, LIMA->LAD nl, VG->Diag nl, VG->OM 100, VG->RCA 100.   Cataract    bilateral sx    COPD (chronic obstructive pulmonary disease) (HCC)    GERD (gastroesophageal reflux disease)    on meds   Hiatal hernia    History of echocardiogram    Echo 10/16: EF 50-55%, no RWMA, Gr 1 DD, mild MR, mild TR   History of kidney stones    Passed   HLD (hyperlipidemia)    on meds   HOH (hard of hearing)    Hypertension    on meds   Leg pain    ABIs 4/14:  R 1.2, L 1.2, TBIs normal   Peripheral neuropathy    From trauma   Reactive airway disease 03/18/2018   Tobacco abuse    Allergies  Allergen Reactions   Iodine Nausea And Vomiting    Turns white in face and sweaty   Iohexol Other (See Comments)    May have caused nausea and vomiting several years ago    Shrimp [Shellfish Allergy] Nausea And Vomiting    Turns white in face and sweaty   Isosorbide Dinitrate Other (See Comments)    headache    Social History   Socioeconomic History   Marital status: Married    Spouse name: Not on file   Number of children: Not on file   Years of education: Not on file   Highest education level: Not on file  Occupational History   Occupation: [employer]  Tobacco Use   Smoking status: Every Day    Packs/day: 1.00    Years: 51.00    Total pack years: 51.00    Types: Cigarettes    Start date: 1955  Passive exposure: Past   Smokeless tobacco: Never  Vaping Use   Vaping Use: Never used  Substance and Sexual Activity   Alcohol use: Yes    Alcohol/week: 3.0 standard drinks of alcohol    Types: 3 Cans of beer per week   Drug use: No   Sexual activity: Yes  Other Topics Concern   Not on file  Social History  Narrative   Married   Gets regular exercise - walks 1/4 mile per day 5 times a week without symptoms   Social Determinants of Health   Financial Resource Strain: Medium Risk (09/03/2021)   Overall Financial Resource Strain (CARDIA)    Difficulty of Paying Living Expenses: Somewhat hard  Food Insecurity: No Food Insecurity (09/02/2021)   Hunger Vital Sign    Worried About Running Out of Food in the Last Year: Never true    Ran Out of Food in the Last Year: Never true  Transportation Needs: No Transportation Needs (09/02/2021)   PRAPARE - Hydrologist (Medical): No    Lack of Transportation (Non-Medical): No  Physical Activity: Inactive (09/02/2021)   Exercise Vital Sign    Days of Exercise per Week: 0 days    Minutes of Exercise per Session: 0 min  Stress: Stress Concern Present (09/02/2021)   [place]    Feeling of Stress : Rather much  Social Connections: Not on file   Vitals:   06/23/22 0746  BP: 110/70  Pulse: (!) 56  Resp: 16  Temp: 97.8 F (36.6 C)  SpO2: 93%   Body mass index is 28.74 kg/m.  Physical Exam Vitals and nursing note reviewed.  Constitutional:      General: He is not in acute distress.    Appearance: He is well-developed.  HENT:     Head: Normocephalic and atraumatic.     Mouth/Throat:     Mouth: Mucous membranes are moist.  Eyes:     Conjunctiva/sclera: Conjunctivae normal.  Cardiovascular:     Rate and Rhythm: Regular rhythm. Bradycardia present.     Heart sounds: No murmur heard. Pulmonary:     Effort: Pulmonary effort is normal. No respiratory distress.     Breath sounds: Normal breath sounds.  Chest:     Chest wall: No crepitus or edema.  Abdominal:     Palpations: Abdomen is soft. There is no hepatomegaly or mass.     Tenderness: There is no abdominal tenderness.  Musculoskeletal:     Lumbar back: No tenderness or bony tenderness.  Skin:     General: Skin is warm.     Findings: No erythema or rash.  Neurological:     Mental Status: He is alert and oriented to person, place, and time.     Cranial Nerves: No cranial nerve deficit.     Gait: Gait normal.  Psychiatric:     Comments: Well groomed, good eye contact.   ASSESSMENT AND PLAN:  Derrick Stokes was seen today for follow-up.  Diagnoses and all orders for this visit: Chronic bilateral low back pain with bilateral sciatica Assessment & Plan: He feels like problem is getting worse. He agrees with increasing duloxetine from 30 mg to 60 mg daily. Continue following with Dr Annette Stable.  Orders: -     DULoxetine HCl; Take 1 capsule (60 mg total) by mouth daily.  Dispense: 90 capsule; Refill: 2  Essential (primary) hypertension Assessment & Plan: BP adequately controlled. Mild bradycardia noted today, we discussed some side effects of Metoprolol, for now no changes. Low salt diet also recommended.   Chronic obstructive pulmonary disease, unspecified COPD type ([place]) Assessment & Plan: Problem is not well controlled. Combivent Respimat 20-100 mg 1 puff bid added today. Continue Xopenex 1-2 puff qid prn. Instructed about warning signs. Stressed the importance of smoking cessation.  Orders: -     Combivent Respimat; Inhale 1 puff into the lungs 2 (two) times daily.  Dispense: 4 g; Refill: 3  Coronary artery disease involving native coronary artery of native heart with other form of angina pectoris ([place]) Assessment & Plan: Still having episodes of angina like symptoms, at rest. He states that has had symptoms intermittently and these recent ones were not as bad. EKG today with no significant changes when compared with prior EKG's, sinus bradycardia, normal axis, no acute ischemia. Continue Metoprolol 50 mg daily,Aspirin 81 m daily, Plavix 75 mg daily, Atorvastatin 80  mg daily,and Zetia 10 mg daily. Clearly instructed about warning signs.   Balance problem Assessment &  Plan: Lumbar radiculopathy and some of his medications can be contributing factors. Fall precautions discussed. Declined PT, he will let us know if he decides to try, I think it will help with fall prevention.   Need for influenza vaccination -     Flu Vaccine QUAD High Dose(Fluad)  I spent a total of 43 minutes in both face to face and non face to face activities for this visit on the date of this encounter, excluding time it took to do EKG. During this time history was obtained and documented, examination was performed, prior labs/imaging reviewed, and assessment/plan discussed.  Return in about 2 months (around 08/24/2022) for chronic problems.  Saachi Zale G. [name], MD  [facility] [place] office.

## 2022-06-23 ENCOUNTER — Ambulatory Visit (INDEPENDENT_AMBULATORY_CARE_PROVIDER_SITE_OTHER): Payer: Medicare HMO | Admitting: Family Medicine

## 2022-06-23 ENCOUNTER — Encounter: Payer: Self-pay | Admitting: Family Medicine

## 2022-06-23 VITALS — BP 110/70 | HR 56 | Temp 97.8°F | Resp 16 | Ht 67.5 in | Wt 186.2 lb

## 2022-06-23 DIAGNOSIS — R2689 Other abnormalities of gait and mobility: Secondary | ICD-10-CM | POA: Diagnosis not present

## 2022-06-23 DIAGNOSIS — M5442 Lumbago with sciatica, left side: Secondary | ICD-10-CM

## 2022-06-23 DIAGNOSIS — R001 Bradycardia, unspecified: Secondary | ICD-10-CM | POA: Diagnosis not present

## 2022-06-23 DIAGNOSIS — G8929 Other chronic pain: Secondary | ICD-10-CM | POA: Diagnosis not present

## 2022-06-23 DIAGNOSIS — Z23 Encounter for immunization: Secondary | ICD-10-CM | POA: Diagnosis not present

## 2022-06-23 DIAGNOSIS — I10 Essential (primary) hypertension: Secondary | ICD-10-CM | POA: Diagnosis not present

## 2022-06-23 DIAGNOSIS — M5441 Lumbago with sciatica, right side: Secondary | ICD-10-CM

## 2022-06-23 DIAGNOSIS — J449 Chronic obstructive pulmonary disease, unspecified: Secondary | ICD-10-CM | POA: Diagnosis not present

## 2022-06-23 DIAGNOSIS — I25118 Atherosclerotic heart disease of native coronary artery with other forms of angina pectoris: Secondary | ICD-10-CM | POA: Diagnosis not present

## 2022-06-23 MED ORDER — DULOXETINE HCL 60 MG PO CPEP: 60.0000 mg | ORAL_CAPSULE | Freq: Every day | ORAL | 2 refills | Status: DC

## 2022-06-23 MED ORDER — COMBIVENT RESPIMAT 20-100 MCG/ACT IN AERS: 1.0000 | INHALATION_SPRAY | Freq: Two times a day (BID) | RESPIRATORY_TRACT | 3 refills | Status: DC

## 2022-06-23 NOTE — Assessment & Plan Note (Signed)
Lumbar radiculopathy and some of his medications can be contributing factors. Fall precautions discussed. Declined PT, he will let us know if he decides to try, I think it will help with fall prevention.

## 2022-06-23 NOTE — Assessment & Plan Note (Addendum)
Still having episodes of angina like symptoms, at rest. He states that has had symptoms intermittently and these recent ones were not as bad. EKG today with no significant changes when compared with prior EKG's, sinus bradycardia, normal axis, no acute ischemia. Continue Metoprolol 50 mg daily,Aspirin 81 m daily, Plavix 75 mg daily, Atorvastatin 80 mg daily,and Zetia 10 mg daily. Clearly instructed about warning signs.

## 2022-06-23 NOTE — Assessment & Plan Note (Addendum)
Problem is not well controlled. Combivent Respimat 20-100 mg 1 puff bid added today. Continue Xopenex 1-2 puff qid prn. Instructed about warning signs. Stressed the importance of smoking cessation.

## 2022-06-23 NOTE — Patient Instructions (Addendum)
A few things to remember from today's visit:  Essential (primary) hypertension  Chronic obstructive pulmonary disease, unspecified COPD type ([place]) - Plan: Ipratropium-Albuterol (COMBIVENT RESPIMAT) 20-100 MCG/ACT AERS respimat  Chronic bilateral low back pain with bilateral sciatica - Plan: DULoxetine (CYMBALTA) 60 MG capsule  Coronary artery disease involving native coronary artery of native heart with other form of angina pectoris (HCC)  Duloxetine increased to 60 mg daily. Arrange appt with your cardiologist if chest pain presents again. Combivent added today to use 2 times daily. Continue Xopenex every 6 hours as needed, 1-2 puff. Smoking cessation is the main treatment for breathing and heart.  If you need refills for medications you take chronically, please call your pharmacy. Do not use My Chart to request refills or for acute issues that need immediate attention. If you send a my chart message, it may take a few days to be addressed, specially if I am not in the office.  Please be sure medication list is accurate. If a new problem present, please set up appointment sooner than planned today.

## 2022-06-23 NOTE — Assessment & Plan Note (Signed)
BP adequately controlled. Mild bradycardia noted today, we discussed some side effects of Metoprolol, for now no changes. Low salt diet also recommended.

## 2022-06-23 NOTE — Assessment & Plan Note (Signed)
He feels like problem is getting worse. He agrees with increasing duloxetine from 30 mg to 60 mg daily. Continue following with Dr Annette Stable.

## 2022-06-24 ENCOUNTER — Telehealth: Payer: Self-pay | Admitting: Pharmacist

## 2022-06-24 NOTE — Telephone Encounter (Signed)
Pharmacy reached out to me as the copay for the Combivent was too high for the patient. There are no exact alternatives to Combivent but the LAMA/LABA alternatives that are covered better are Anoro and Bevespi. Will route to PCP to see if he can change to one of these.

## 2022-06-26 ENCOUNTER — Other Ambulatory Visit: Payer: Self-pay | Admitting: Family Medicine

## 2022-06-26 DIAGNOSIS — J449 Chronic obstructive pulmonary disease, unspecified: Secondary | ICD-10-CM

## 2022-06-26 MED ORDER — UMECLIDINIUM-VILANTEROL 62.5-25 MCG/ACT IN AEPB: 1.0000 | INHALATION_SPRAY | Freq: Every day | RESPIRATORY_TRACT | 2 refills | Status: DC

## 2022-06-26 NOTE — Telephone Encounter (Signed)
Prescription for Anoro Ellipta sent. Thanks, BJ

## 2022-06-26 NOTE — Progress Notes (Signed)
Call to patient, did not reach left detailed voicemail with wife about the change of medications and that she may return a call to me if they are needing education on the inhaler so that I may coordinate a follow up with the Pharmacist.    Ned Clines CMA Clinical Pharmacist Assistant [phone]

## 2022-08-17 DIAGNOSIS — E785 Hyperlipidemia, unspecified: Secondary | ICD-10-CM | POA: Diagnosis not present

## 2022-08-17 DIAGNOSIS — Z89022 Acquired absence of left finger(s): Secondary | ICD-10-CM | POA: Diagnosis not present

## 2022-08-17 DIAGNOSIS — Z7902 Long term (current) use of antithrombotics/antiplatelets: Secondary | ICD-10-CM | POA: Diagnosis not present

## 2022-08-17 DIAGNOSIS — K219 Gastro-esophageal reflux disease without esophagitis: Secondary | ICD-10-CM | POA: Diagnosis not present

## 2022-08-17 DIAGNOSIS — J449 Chronic obstructive pulmonary disease, unspecified: Secondary | ICD-10-CM | POA: Diagnosis not present

## 2022-08-17 DIAGNOSIS — I10 Essential (primary) hypertension: Secondary | ICD-10-CM | POA: Diagnosis not present

## 2022-08-17 DIAGNOSIS — Z8249 Family history of ischemic heart disease and other diseases of the circulatory system: Secondary | ICD-10-CM | POA: Diagnosis not present

## 2022-08-17 DIAGNOSIS — Z91013 Allergy to seafood: Secondary | ICD-10-CM | POA: Diagnosis not present

## 2022-08-17 DIAGNOSIS — M1990 Unspecified osteoarthritis, unspecified site: Secondary | ICD-10-CM | POA: Diagnosis not present

## 2022-08-17 DIAGNOSIS — H9193 Unspecified hearing loss, bilateral: Secondary | ICD-10-CM | POA: Diagnosis not present

## 2022-08-17 DIAGNOSIS — G629 Polyneuropathy, unspecified: Secondary | ICD-10-CM | POA: Diagnosis not present

## 2022-08-17 DIAGNOSIS — R69 Illness, unspecified: Secondary | ICD-10-CM | POA: Diagnosis not present

## 2022-08-21 NOTE — Progress Notes (Unsigned)
HPI: [name] is a 72 y.o. male, who is here today with his wife for chronic disease management.  Last seen on 06/23/22, when he was complaining of worsening lower back pain radiated to lower extremities with numbness.[name]  He is back pain remains the same, with pain increasing throughout the day as he becomes more active. However, he notes that his leg and foot pain/numbness have improved. Negative for saddle anesthesia or changes in bowel/bladder function.  He has tolerated medication well. He is currently on duloxetine 60 mg daily. He follows with neurosurgeon, Dr. Trenton Gammon.  COPD: Combivent Respimat 20-100 mcg 1 puff twice daily was added last visit but not covered by insurance. Currently he is on anoro Ellipta 62.5-25 mcg 1 puff daily. He reports that his breathing has improved since starting the new inhaler, with less wheezing, coughing, and no shortness of breath. He has not used Xopenex inhaler since [employer] was added.  He has been experiencing left ear popping for two or more weeks, with no associated pain. He  describes sensation as it as if he is talking in a drum. Left nostril nosebleed x 1.  He had mild left earache and sinus pressure, symptoms improved after blowing his nose. He has not been using any nasal sprays or decongestants. He wonders if symptoms could be caused by dental issues.  Negative for fever, chills, sore throat, changes in appetite. + Smoker.  HTN: Currently he is on metoprolol succinate 50 mg daily. Lab Results  Component Value Date   CREATININE 0.89 12/08/2021   BUN 10 12/08/2021   NA 138 12/08/2021   K 4.9 12/08/2021   CL 102 12/08/2021   CO2 28 12/08/2021   PYP:PJKD visit he reported 2 episodes of left-sided chest pain radiated to left upper extremity while he was at rest. Negative for frequent/severe headaches, visual changes, palpitations, orthopnea, PND, or edema. He denies any recent episodes of chest pain since his last  visit. Last visit with cardiologist on 12/04/2021. He is not checking BP regularly.  Aortic atherosclerosis seen on chest CT in 12/2021. He is on atorvastatin 80 mg daily, and Zetia 10 mg daily, and Aspirin 81 mg daily.  Lab Results  Component Value Date   CHOL 111 12/08/2021   HDL 41.20 12/08/2021   LDLCALC 51 12/08/2021   LDLDIRECT 68.0 11/19/2014   TRIG 96.0 12/08/2021   CHOLHDL 3 12/08/2021   Review of Systems  Constitutional:  Positive for fatigue. Negative for chills.  HENT:  Negative for ear discharge and ear pain.   Gastrointestinal:  Negative for abdominal pain, nausea and vomiting.  Genitourinary:  Negative for decreased urine volume, dysuria and hematuria.  Skin:  Negative for rash.  Neurological:  Negative for syncope, facial asymmetry and weakness.  See other pertinent positives and negatives in HPI.  Current Outpatient Medications on File Prior to Visit  Medication Sig Dispense Refill   aspirin EC 81 MG tablet Take 81 mg by mouth at bedtime.      atorvastatin (LIPITOR) 80 MG tablet Take 1 tablet (80 mg total) by mouth every morning. 90 tablet 3   clopidogrel (PLAVIX) 75 MG tablet TAKE ONE TABLET BY MOUTH EVERY MORNING 90 tablet 3   DULoxetine (CYMBALTA) 60 MG capsule Take 1 capsule (60 mg total) by mouth daily. 90 capsule 2   ezetimibe (ZETIA) 10 MG tablet Take 1 tablet (10 mg total) by mouth daily. 90 tablet 3   metoprolol succinate (TOPROL-XL) 50 MG 24 hr tablet  TAKE ONE TABLET BY MOUTH ONCE DAILY WITH A MEAL 90 tablet 3   nitroGLYCERIN (NITROSTAT) 0.4 MG SL tablet Place 1 tablet (0.4 mg total) under the tongue every 5 (five) minutes as needed for chest pain. PLACE 1 TABLET UNDER THE TONGUE EVERY 5 MINUTES X 3 DOSES AS NEEDED FOR CHEST PAIN. 25 tablet 3   Omega-3 Fatty Acids (FISH OIL) 1000 MG CPDR Take 1,000 mg by mouth daily.      pantoprazole (PROTONIX) 40 MG tablet TAKE ONE TABLET BY MOUTH ONCE DAILY 90 tablet 1   vitamin B-12 (CYANOCOBALAMIN) 1000 MCG tablet  Take 1,000 mcg by mouth daily.      No current facility-administered medications on file prior to visit.   Past Medical History:  Diagnosis Date   Arthritis    B12 deficiency    Borderline   CAD (coronary artery disease)    a. s/p CABG 01/2011;  b. ETT Myoview 3/14:  Low risk;  c. 09/2014 Cath/PCI: LM nl, LAD 100p, LCX 30mOM2 60-70p (2.75x28 Synergy DES), RCA dom, 839matherectomy, 3.5x24 Synergy DES), PDA nl, LIMA->LAD nl, VG->Diag nl, VG->OM 100, VG->RCA 100.   Cataract    bilateral sx    COPD (chronic obstructive pulmonary disease) (HCC)    GERD (gastroesophageal reflux disease)    on meds   Hiatal hernia    History of echocardiogram    Echo 10/16: EF 50-55%, no RWMA, Gr 1 DD, mild MR, mild TR   History of kidney stones    Passed   HLD (hyperlipidemia)    on meds   HOH (hard of hearing)    Hypertension    on meds   Leg pain    ABIs 4/14:  R 1.2, L 1.2, TBIs normal   Peripheral neuropathy    From trauma   Reactive airway disease 03/18/2018   Tobacco abuse    Allergies  Allergen Reactions   Iodine Nausea And Vomiting    Turns white in face and sweaty   Iohexol Other (See Comments)    May have caused nausea and vomiting several years ago    Shrimp [Shellfish Allergy] Nausea And Vomiting    Turns white in face and sweaty   Isosorbide Dinitrate Other (See Comments)    headache    Social History   Socioeconomic History   Marital status: Married    Spouse name: Not on file   Number of children: Not on file   Years of education: Not on file   Highest education level: 10th grade  Occupational History   Occupation: [employer]  Tobacco Use   Smoking status: Every Day    Packs/day: 1.00    Years: 51.00    Total pack years: 51.00    Types: Cigarettes    Start date: 1934  Passive exposure: Past   Smokeless tobacco: Never  Vaping Use   Vaping Use: Never used  Substance and Sexual Activity   Alcohol use: Yes    Alcohol/week: 3.0 standard drinks of alcohol     Types: 3 Cans of beer per week   Drug use: No   Sexual activity: Yes  Other Topics Concern   Not on file  Social History Narrative   Married   Gets regular exercise - walks 1/4 mile per day 5 times a week without symptoms   Social Determinants of Health   Financial Resource Strain: Medium Risk (08/21/2022)   Overall Financial Resource Strain (CARDIA)    Difficulty of Paying Living Expenses:  Somewhat hard  Food Insecurity: No Food Insecurity (08/21/2022)   Hunger Vital Sign    Worried About Running Out of Food in the Last Year: Never true    Ran Out of Food in the Last Year: Never true  Transportation Needs: No Transportation Needs (08/21/2022)   PRAPARE - Hydrologist (Medical): No    Lack of Transportation (Non-Medical): No  Physical Activity: Sufficiently Active (08/21/2022)   Exercise Vital Sign    Days of Exercise per Week: 5 days    Minutes of Exercise per Session: 30 min  Stress: No Stress Concern Present (08/21/2022)   [place]    Feeling of Stress : Only a little  Social Connections: Socially Isolated (08/21/2022)   Social Connection and Isolation Panel [NHANES]    Frequency of Communication with Friends and Family: Once a week    Frequency of Social Gatherings with Friends and Family: Never    Attends Religious Services: Never    Marine scientist or Organizations: No    Attends Archivist Meetings: Not on file    Marital Status: Married   Vitals:   08/24/22 0651  BP: 128/80  Pulse: 73  Resp: 16  SpO2: 94%   Body mass index is 29.03 kg/m.  Physical Exam Vitals and nursing note reviewed.  Constitutional:      General: He is not in acute distress.    Appearance: He is well-developed.  HENT:     Head: Normocephalic and atraumatic.     Right Ear: Tympanic membrane, ear canal and external ear normal.     Left Ear: Tympanic membrane, ear canal and external ear  normal.     Nose: Septal deviation and rhinorrhea present.     Right Turbinates: Not enlarged.     Left Turbinates: Not enlarged.     Mouth/Throat:     Mouth: Mucous membranes are moist.  Eyes:     Conjunctiva/sclera: Conjunctivae normal.  Cardiovascular:     Rate and Rhythm: Normal rate and regular rhythm.     Heart sounds: No murmur heard.    Comments: DP pulses palpable. Pulmonary:     Effort: Pulmonary effort is normal. No respiratory distress.     Breath sounds: Normal breath sounds.  Chest:     Chest wall: No tenderness, crepitus or edema.  Abdominal:     Palpations: Abdomen is soft. There is no mass.     Tenderness: There is no abdominal tenderness.  Lymphadenopathy:     Cervical: No cervical adenopathy.  Skin:    General: Skin is warm.     Findings: No erythema or rash.  Neurological:     Mental Status: He is alert and oriented to person, place, and time.     Cranial Nerves: No cranial nerve deficit.     Comments: Stable gait, not assisted.  Psychiatric:        Mood and Affect: Mood and affect normal.   ASSESSMENT AND PLAN:  Derrick Stokes was seen today for medical management of chronic issues.  Diagnoses and all orders for this visit:  Chronic bilateral low back pain with bilateral sciatica Assessment & Plan: Radicular pain has improved, lower back pain remains stable. Continue duloxetine 60 mg daily. Following with Dr. Eddie Dibbles.   Atherosclerosis of aorta [facility]) Assessment & Plan: Seen on chest CT in 12/2021: Mild atherosclerotic calcification is noted in the wall of the thoracic aorta. On atorvastatin 80  mg daily and Aspirin 81 mg daily.   Coronary artery disease involving native coronary artery of native heart with other form of angina pectoris [facility]) Assessment & Plan: Reporting no CP since his last visit. Continue metoprolol succinate 50 mg daily, Aspirin 81 mg daily, clopidogrel 75 mg daily, and Zetia 10 mg daily. Continue following with  cardiology. Instructed about warning signs.   Dysfunction of left eustachian tube Assessment & Plan: History and examination do not suggest a serious process. Symptoms improved after blowing his nose. Continue nasal saline irrigation for nasal/sinus congestion. Strongly recommended smoking cessation. Plain Mucinex may help. Continue autoinflation maneuvers as needed throughout the day to help with symptoms. Instructed about warning signs.   Chronic obstructive pulmonary disease, unspecified COPD type ([place]) Assessment & Plan: He is reporting that symptoms have improved, slight wheezing noted today on auscultation, states that he has not taking his Anoro Ellipta inhaler today. For now continue Anoro Ellipta 62.5-25 mcg 1 puff daily and Xopenex inhaler 1 to 2 puff every 4-6 hours as needed. Stressed the importance of smoking cessation.  Orders: -     Levalbuterol Tartrate; Inhale 1-2 puffs into the lungs every 6 (six) hours as needed for wheezing.  Dispense: 1 each; Refill: 2 -     Umeclidinium-Vilanterol; Inhale 1 puff into the lungs daily at 6 (six) AM.  Dispense: 60 each; Refill: 5  Peripheral neuropathic pain Assessment & Plan: Continue appropriate foot/skin care. Symptoms improved with duloxetine 60 mg daily, so no changes.   Essential (primary) hypertension Assessment & Plan: BP adequately controlled. Continue metoprolol succinate 50 mg daily. Recommend monitoring BP at home, he can bring BP monitor with him next visit. Low salt diet to continue.   I spent a total of 41 minutes in both face to face and non face to face activities for this visit on the date of this encounter. During this time history was obtained and documented, examination was performed, prior labs/imaging reviewed, and assessment/plan discussed.  Return in about 5 months (around 01/22/2023).  Francesco Provencal G. [name], MD  [facility]. [place] office.

## 2022-08-24 ENCOUNTER — Encounter: Payer: Self-pay | Admitting: Family Medicine

## 2022-08-24 ENCOUNTER — Ambulatory Visit (INDEPENDENT_AMBULATORY_CARE_PROVIDER_SITE_OTHER): Payer: Medicare HMO | Admitting: Family Medicine

## 2022-08-24 VITALS — BP 128/80 | HR 73 | Resp 16 | Ht 67.5 in | Wt 188.1 lb

## 2022-08-24 DIAGNOSIS — M5441 Lumbago with sciatica, right side: Secondary | ICD-10-CM

## 2022-08-24 DIAGNOSIS — M5442 Lumbago with sciatica, left side: Secondary | ICD-10-CM

## 2022-08-24 DIAGNOSIS — I10 Essential (primary) hypertension: Secondary | ICD-10-CM

## 2022-08-24 DIAGNOSIS — J449 Chronic obstructive pulmonary disease, unspecified: Secondary | ICD-10-CM

## 2022-08-24 DIAGNOSIS — M792 Neuralgia and neuritis, unspecified: Secondary | ICD-10-CM | POA: Diagnosis not present

## 2022-08-24 DIAGNOSIS — G8929 Other chronic pain: Secondary | ICD-10-CM | POA: Diagnosis not present

## 2022-08-24 DIAGNOSIS — I700 Atherosclerosis of aorta: Secondary | ICD-10-CM

## 2022-08-24 DIAGNOSIS — I25118 Atherosclerotic heart disease of native coronary artery with other forms of angina pectoris: Secondary | ICD-10-CM

## 2022-08-24 DIAGNOSIS — H6992 Unspecified Eustachian tube disorder, left ear: Secondary | ICD-10-CM | POA: Diagnosis not present

## 2022-08-24 MED ORDER — UMECLIDINIUM-VILANTEROL 62.5-25 MCG/ACT IN AEPB: 1.0000 | INHALATION_SPRAY | Freq: Every day | RESPIRATORY_TRACT | 5 refills | Status: DC

## 2022-08-24 MED ORDER — LEVALBUTEROL TARTRATE 45 MCG/ACT IN AERO: 1.0000 | INHALATION_SPRAY | Freq: Four times a day (QID) | RESPIRATORY_TRACT | 2 refills | Status: DC | PRN

## 2022-08-24 NOTE — Assessment & Plan Note (Signed)
He is reporting that symptoms have improved, slight wheezing noted today on auscultation, states that he has not taking his Anoro Ellipta inhaler today. For now continue Anoro Ellipta 62.5-25 mcg 1 puff daily and Xopenex inhaler 1 to 2 puff every 4-6 hours as needed. Stressed the importance of smoking cessation.

## 2022-08-24 NOTE — Assessment & Plan Note (Signed)
Continue appropriate foot/skin care. Symptoms improved with duloxetine 60 mg daily, so no changes.

## 2022-08-24 NOTE — Assessment & Plan Note (Signed)
BP adequately controlled. Continue metoprolol succinate 50 mg daily. Recommend monitoring BP at home, he can bring BP monitor with him next visit. Low salt diet to continue.

## 2022-08-24 NOTE — Assessment & Plan Note (Signed)
History and examination do not suggest a serious process. Symptoms improved after blowing his nose. Continue nasal saline irrigation for nasal/sinus congestion. Strongly recommended smoking cessation. Plain Mucinex may help. Continue autoinflation maneuvers as needed throughout the day to help with symptoms. Instructed about warning signs.

## 2022-08-24 NOTE — Assessment & Plan Note (Signed)
Seen on chest CT in 12/2021: Mild atherosclerotic calcification is noted in the wall of the thoracic aorta. On atorvastatin 80 mg daily and Aspirin 81 mg daily.

## 2022-08-24 NOTE — Assessment & Plan Note (Signed)
Radicular pain has improved, lower back pain remains stable. Continue duloxetine 60 mg daily. Following with Dr. Eddie Dibbles.

## 2022-08-24 NOTE — Patient Instructions (Addendum)
A few things to remember from today's visit:  Chronic obstructive pulmonary disease, unspecified COPD type ([place]), Chronic  Atherosclerosis of aorta ([place]), Chronic  Coronary artery disease involving native coronary artery of native heart with other form of angina pectoris (HCC), Chronic  Nasal saline irrigations as needed and blow nose when congested. No changes in inhalers. Plain Mucinex to help with mucus. Smoking cessation is important,otherwise congestion last longer. Monitor for new symptoms.  If you need refills for medications you take chronically, please call your pharmacy. Do not use My Chart to request refills or for acute issues that need immediate attention. If you send a my chart message, it may take a few days to be addressed, specially if I am not in the office.  Please be sure medication list is accurate. If a new problem present, please set up appointment sooner than planned today.

## 2022-08-24 NOTE — Assessment & Plan Note (Signed)
Reporting no CP since his last visit. Continue metoprolol succinate 50 mg daily, Aspirin 81 mg daily, clopidogrel 75 mg daily, and Zetia 10 mg daily. Continue following with cardiology. Instructed about warning signs.

## 2022-09-01 ENCOUNTER — Telehealth: Payer: Self-pay

## 2022-09-01 NOTE — Progress Notes (Signed)
Patient ID: Derrick Stokes, male   DOB: 22-Oct-1950, 72 y.o.   MRN: [id number]  Care Management & Coordination Services Pharmacy Team  Reason for Encounter: Medication coordination and delivery  Contacted patient to discuss medications and coordinate delivery from [facility]. Spoke with patient on 09/01/2022  Cycle dispensing form sent to [facility] for review.   Last adherence delivery date:06/08/22      Patient is due for next adherence delivery on: 09/14/22  This delivery to include: Adherence Packaging  90 Days  Duloxetine (Cymbalta) 30 mg: take one capsule at breakfast Ezetimibe (Zetia) 10 mg: take one tablet at breakfast Pantoprazole (Protonix) 40 mg: take one tablet at breakfast Atorvastatin (Lipitor)  80 mg: take one tablet at breakfast Clopidogrel (Plavix) 75 mg : take one tablet at breakfast Metoprolol Succinate (Toprol)  50 mg : take one tablet at breakfast Fish oil 1000 mg: take one capsule at breakfast    Scheduled follow up with Pharmacist on 3/22 in office   Any concerns about your medications? Yes Wife informed that patients 150.00 deductible for the year will likely be applied to this delivery. She reports she will confirm with her insurance as she does not remember this happening in the past and call me back if she is not in agreement with this.  How often do you forget or accidentally miss a dose? Never  Do you use a pillbox? No  Is patient in packaging Yes   Chart review: Recent office visits:  08/24/22 [name], [name], MD - Patient presented for Chronic bilateral low back pain with sciatica and other concerns. No medication changes.  Recent consult visits:  None   Hospital visits:  None in previous 6 months  Medications: Outpatient Encounter Medications as of 09/01/2022  Medication Sig   aspirin EC 81 MG tablet Take 81 mg by mouth at bedtime.    atorvastatin (LIPITOR) 80 MG tablet Take 1 tablet (80 mg total) by mouth every morning.    clopidogrel (PLAVIX) 75 MG tablet TAKE ONE TABLET BY MOUTH EVERY MORNING   DULoxetine (CYMBALTA) 60 MG capsule Take 1 capsule (60 mg total) by mouth daily.   ezetimibe (ZETIA) 10 MG tablet Take 1 tablet (10 mg total) by mouth daily.   levalbuterol (XOPENEX HFA) 45 MCG/ACT inhaler Inhale 1-2 puffs into the lungs every 6 (six) hours as needed for wheezing.   metoprolol succinate (TOPROL-XL) 50 MG 24 hr tablet TAKE ONE TABLET BY MOUTH ONCE DAILY WITH A MEAL   nitroGLYCERIN (NITROSTAT) 0.4 MG SL tablet Place 1 tablet (0.4 mg total) under the tongue every 5 (five) minutes as needed for chest pain. PLACE 1 TABLET UNDER THE TONGUE EVERY 5 MINUTES X 3 DOSES AS NEEDED FOR CHEST PAIN.   Omega-3 Fatty Acids (FISH OIL) 1000 MG CPDR Take 1,000 mg by mouth daily.    pantoprazole (PROTONIX) 40 MG tablet TAKE ONE TABLET BY MOUTH ONCE DAILY   umeclidinium-vilanterol (ANORO ELLIPTA) 62.5-25 MCG/ACT AEPB Inhale 1 puff into the lungs daily at 6 (six) AM.   vitamin B-12 (CYANOCOBALAMIN) 1000 MCG tablet Take 1,000 mcg by mouth daily.    No facility-administered encounter medications on file as of 09/01/2022.   BP Readings from Last 3 Encounters:  08/24/22 128/80  06/23/22 110/70  12/08/21 130/82    Pulse Readings from Last 3 Encounters:  08/24/22 73  06/23/22 (!) 56  12/08/21 64    Lab Results  Component Value Date/Time   HGBA1C 5.6 03/31/2021 09:30 AM  HGBA1C 5.3 09/14/2018 08:06 AM   Lab Results  Component Value Date   CREATININE 0.89 12/08/2021   BUN 10 12/08/2021   GFR 87.62 12/08/2021   GFRNONAA 95 03/08/2019   GFRAA 109 03/08/2019   NA 138 12/08/2021   K 4.9 12/08/2021   CALCIUM 9.7 12/08/2021   CO2 28 12/08/2021        [place] Clinical Pharmacist Assistant [phone]

## 2022-09-08 ENCOUNTER — Ambulatory Visit: Payer: Medicare HMO

## 2022-09-15 ENCOUNTER — Telehealth: Payer: Medicare HMO | Admitting: Family Medicine

## 2022-10-01 ENCOUNTER — Telehealth: Payer: Self-pay

## 2022-10-01 NOTE — Progress Notes (Signed)
Patient ID: Derrick Stokes, male   DOB: 11-12-1950, 72 y.o.   MRN: [id number]  Care Management & Coordination Services Pharmacy Team  Reason for Encounter: Medication coordination and delivery  Contacted patient to discuss medications and coordinate delivery from [facility]. Spoke with patient on 10/01/2022  Cycle dispensing form sent to [facility] for review.   Last adherence delivery date:09/14/22      Patient is due for next adherence delivery on: 10/13/22  This delivery to include: Adherence Packaging  30 Days  Duloxetine (Cymbalta) 30 mg: take one capsule at breakfast Ezetimibe (Zetia) 10 mg: take one tablet at breakfast Pantoprazole (Protonix) 40 mg: take one tablet at breakfast Atorvastatin (Lipitor)  80 mg: take one tablet at breakfast Clopidogrel (Plavix) 75 mg : take one tablet at breakfast Metoprolol Succinate (Toprol)  50 mg : take one tablet at breakfast Fish oil 1000 mg: take one capsule at breakfast  Patient declined the following medications this month: None     Any concerns about your medications? No  How often do you forget or accidentally miss a dose? Never  Do you use a pillbox? No  Is patient in packaging Yes   Chart review: Recent office visits:  none  Recent consult visits:  none  Hospital visits:  None in previous 6 months  Medications: Outpatient Encounter Medications as of 10/01/2022  Medication Sig   aspirin EC 81 MG tablet Take 81 mg by mouth at bedtime.    atorvastatin (LIPITOR) 80 MG tablet Take 1 tablet (80 mg total) by mouth every morning.   clopidogrel (PLAVIX) 75 MG tablet TAKE ONE TABLET BY MOUTH EVERY MORNING   DULoxetine (CYMBALTA) 60 MG capsule Take 1 capsule (60 mg total) by mouth daily.   ezetimibe (ZETIA) 10 MG tablet Take 1 tablet (10 mg total) by mouth daily.   levalbuterol (XOPENEX HFA) 45 MCG/ACT inhaler Inhale 1-2 puffs into the lungs every 6 (six) hours as needed for wheezing.   metoprolol succinate (TOPROL-XL)  50 MG 24 hr tablet TAKE ONE TABLET BY MOUTH ONCE DAILY WITH A MEAL   nitroGLYCERIN (NITROSTAT) 0.4 MG SL tablet Place 1 tablet (0.4 mg total) under the tongue every 5 (five) minutes as needed for chest pain. PLACE 1 TABLET UNDER THE TONGUE EVERY 5 MINUTES X 3 DOSES AS NEEDED FOR CHEST PAIN.   Omega-3 Fatty Acids (FISH OIL) 1000 MG CPDR Take 1,000 mg by mouth daily.    pantoprazole (PROTONIX) 40 MG tablet TAKE ONE TABLET BY MOUTH ONCE DAILY   umeclidinium-vilanterol (ANORO ELLIPTA) 62.5-25 MCG/ACT AEPB Inhale 1 puff into the lungs daily at 6 (six) AM.   vitamin B-12 (CYANOCOBALAMIN) 1000 MCG tablet Take 1,000 mcg by mouth daily.    No facility-administered encounter medications on file as of 10/01/2022.   BP Readings from Last 3 Encounters:  08/24/22 128/80  06/23/22 110/70  12/08/21 130/82    Pulse Readings from Last 3 Encounters:  08/24/22 73  06/23/22 (!) 56  12/08/21 64    Lab Results  Component Value Date/Time   HGBA1C 5.6 03/31/2021 09:30 AM   HGBA1C 5.3 09/14/2018 08:06 AM   Lab Results  Component Value Date   CREATININE 0.89 12/08/2021   BUN 10 12/08/2021   GFR 87.62 12/08/2021   GFRNONAA 95 03/08/2019   GFRAA 109 03/08/2019   NA 138 12/08/2021   K 4.9 12/08/2021   CALCIUM 9.7 12/08/2021   CO2 28 12/08/2021      [place] Clinical Pharmacist Assistant  [phone]   

## 2022-10-08 ENCOUNTER — Telehealth: Payer: Self-pay

## 2022-10-08 NOTE — Progress Notes (Signed)
Patient ID: Derrick Stokes, male   DOB: [birth year], 72 y.o.   MRN: [id number]  Care Management & Coordination Services Pharmacy Team  Reason for Encounter: Appointment Reminder  Contacted patient to confirm in office appointment with Theo Dills, PharmD on 10/09/22 at 32. Spoke with caregiver on 10/08/2022     Star Rating Drugs:  Atorvastatin 80 mg - Last filled 09/09/22 30 DS at [facility] - 09/02/21  Zoster Vaccine - Postponed Colonoscopy - [place] Clinical Pharmacist Assistant [phone]

## 2022-10-08 NOTE — Progress Notes (Signed)
Care Management & Coordination Services Pharmacy Note  10/09/2022 Name:  Derrick Stokes MRN:  [id number] DOB:  03-04-1951  Summary: -Reports well-controlled breathing despite not having Anoro (cost-prohibitive, pursuing PAP) -Denies having to use rescue inhaler -Denies any chest pain, signs of stroke, or any signs of bleed with use of plavix/aspirin  Recommendations/Changes made from today's visit: -Recommended PAP for anoro to assist with cost of inhaler, awaiting return of patient income info -Counseled on signs of bleed/stroke/MI  Follow up plan: COPD review in 3 months Pharmacist visit in 6 months   Subjective: Derrick Stokes is an 72 y.o. year old male who is a primary patient of [name], Malka So, MD.  The care coordination team was consulted for assistance with disease management and care coordination needs.    Engaged with patient face to face for follow up visit.  Recent office visits: 06/23/22 [name], [name], MD - For chronic low back pain and other concerns. START Combivent 20-100 1 puff BID, Increase Cymbalta to 60mg  1 qd 08/24/22 [name], [name], MD - Patient presented for Chronic bilateral low back pain with sciatica and other concerns. No medication changes.   Recent consult visits: None  Hospital visits: None in previous 6 months   Objective:  Lab Results  Component Value Date   CREATININE 0.89 12/08/2021   BUN 10 12/08/2021   GFR 87.62 12/08/2021   EGFR 96 10/07/2020   GFRNONAA 95 03/08/2019   GFRAA 109 03/08/2019   NA 138 12/08/2021   K 4.9 12/08/2021   CALCIUM 9.7 12/08/2021   CO2 28 12/08/2021   GLUCOSE 94 12/08/2021    Lab Results  Component Value Date/Time   HGBA1C 5.6 03/31/2021 09:30 AM   HGBA1C 5.3 09/14/2018 08:06 AM   GFR 87.62 12/08/2021 08:03 AM   GFR 90.92 03/31/2021 09:30 AM   MICROALBUR <0.7 03/31/2021 09:30 AM    Last diabetic Eye exam: No results found for: "HMDIABEYEEXA"  Last diabetic Foot exam: No results found for:  "HMDIABFOOTEX"   Lab Results  Component Value Date   CHOL 111 12/08/2021   HDL 41.20 12/08/2021   LDLCALC 51 12/08/2021   LDLDIRECT 68.0 11/19/2014   TRIG 96.0 12/08/2021   CHOLHDL 3 12/08/2021       Latest Ref Rng & Units 12/08/2021    8:03 AM 03/31/2021    9:30 AM 02/05/2020    2:17 PM  Hepatic Function  Total Protein 6.0 - 8.3 g/dL 7.1  6.9  7.0   Albumin 3.5 - 5.2 g/dL 4.3  4.2    AST 0 - 37 U/L 18  17  24    ALT 0 - 53 U/L 19  24  36   Alk Phosphatase 39 - 117 U/L 80  66    Total Bilirubin 0.2 - 1.2 mg/dL 0.9  1.0  1.0     Lab Results  Component Value Date/Time   TSH 1.75 03/31/2021 09:30 AM   TSH 3.180 09/16/2014 06:15 PM       Latest Ref Rng & Units 10/07/2020    9:30 AM 02/05/2020    2:17 PM 12/29/2018   10:34 AM  CBC  WBC 3.4 - 10.8 x10E3/uL 7.0  8.5  7.0   Hemoglobin 13.0 - 17.7 g/dL 16.5  16.2  15.7   Hematocrit 37.5 - 51.0 % 47.8  47.5  45.4   Platelets 150 - 450 x10E3/uL 261  245  230     Lab Results  Component Value Date/Time  [id number] 03/31/2021 09:30 AM   VITAMINB12 571 09/20/2020 08:58 AM    Clinical ASCVD: Yes  The ASCVD Risk score (Arnett DK, et al., 2019) failed to calculate for the following reasons:   The valid total cholesterol range is 130 to 320 mg/dL       08/24/2022    7:06 AM 06/23/2022    7:56 AM 09/02/2021   10:26 AM  Depression screen PHQ 2/9  Decreased Interest 0 0 0  Down, Depressed, Hopeless 0 0 0  PHQ - 2 Score 0 0 0     Social History   Tobacco Use  Smoking Status Every Day   Packs/day: 1.00   Years: 51.00   Additional pack years: 0.00   Total pack years: 51.00   Types: Cigarettes   Start date: 1970   Passive exposure: Past  Smokeless Tobacco Never   BP Readings from Last 3 Encounters:  08/24/22 128/80  06/23/22 110/70  12/08/21 130/82   Pulse Readings from Last 3 Encounters:  08/24/22 73  06/23/22 (!) 56  12/08/21 64   Wt Readings from Last 3 Encounters:  08/24/22 188 lb 2 oz (85.3 kg)  06/23/22  186 lb 4 oz (84.5 kg)  12/08/21 182 lb (82.6 kg)   BMI Readings from Last 3 Encounters:  08/24/22 29.03 kg/m  06/23/22 28.74 kg/m  12/08/21 28.08 kg/m    Allergies  Allergen Reactions   Iodine Nausea And Vomiting    Turns white in face and sweaty   Iohexol Other (See Comments)    May have caused nausea and vomiting several years ago    Shrimp [Shellfish Allergy] Nausea And Vomiting    Turns white in face and sweaty   Isosorbide Dinitrate Other (See Comments)    headache    Medications Reviewed Today     Reviewed by Maren Reamer, [place] (Pharmacist) on 10/09/22 at 1207  Med List Status: <None>   Medication Order Taking? Sig Documenting Provider Last Dose Status Informant  aspirin EC 81 MG tablet [id number] No Take 81 mg by mouth at bedtime.  [provider] Taking Active Spouse/Significant Other  atorvastatin (LIPITOR) 80 MG tablet [id number] No Take 1 tablet (80 mg total) by mouth every morning. Swinyer, Lanice Schwab, NP Taking Active   clopidogrel (PLAVIX) 75 MG tablet [id number] No TAKE ONE TABLET BY MOUTH EVERY MORNING Swinyer, Lanice Schwab, NP Taking Active   DULoxetine (CYMBALTA) 60 MG capsule [id number] No Take 1 capsule (60 mg total) by mouth daily. [name], [name], MD Taking Active   ezetimibe (ZETIA) 10 MG tablet [id number] No Take 1 tablet (10 mg total) by mouth daily. Swinyer, Lanice Schwab, NP Taking Active   levalbuterol [facility] HFA) 45 MCG/ACT inhaler [id number]  Inhale 1-2 puffs into the lungs every 6 (six) hours as needed for wheezing. [name], [name], MD  Active   metoprolol succinate (TOPROL-XL) 50 MG 24 hr tablet [id number] No TAKE ONE TABLET BY MOUTH ONCE DAILY WITH A MEAL Swinyer, Lanice Schwab, NP Taking Active   nitroGLYCERIN (NITROSTAT) 0.4 MG SL tablet [id number] No Place 1 tablet (0.4 mg total) under the tongue every 5 (five) minutes as needed for chest pain. PLACE 1 TABLET UNDER THE TONGUE EVERY 5 MINUTES X 3 DOSES AS NEEDED FOR CHEST PAIN. Swinyer, Lanice Schwab,  NP Taking Active   Omega-3 Fatty Acids (FISH OIL) 1000 MG CPDR [id number] No Take 1,000 mg by mouth daily.  [provider] Taking Active Spouse/Significant Other  pantoprazole (PROTONIX) 40 MG  tablet [id number] No TAKE ONE TABLET BY MOUTH ONCE DAILY Swinyer, Lanice Schwab, NP Taking Active   umeclidinium-vilanterol (ANORO ELLIPTA) 62.5-25 MCG/ACT AEPB [id number]  Inhale 1 puff into the lungs daily at 6 (six) AM. [name], [name], MD  Active   vitamin B-12 (CYANOCOBALAMIN) 1000 MCG tablet [id number] No Take 1,000 mcg by mouth daily.  [provider] Taking Active Spouse/Significant Other            SDOH:  (Social Determinants of Health) assessments and interventions performed: Yes SDOH Interventions    [place] Coordination from 10/09/2022 in [place] Telephone from 09/03/2021 in [place] at [place] Management from 02/28/2021 in [place] at [place] Interventions Intervention Not Indicated -- --  Transportation Interventions -- -- Intervention Not Indicated  Financial Strain Interventions -- Other (Comment)  [emailed information for [employer] [facility]] Other (Comment)  [switching pharmacies to cut down on cost of Ezetimibe]       Medication Assistance: None required.  Patient affirms current coverage meets needs.  Medication Access: Within the past 30 days, how often has patient missed a dose of medication? None Is a pillbox or other method used to improve adherence? Yes  Factors that may affect medication adherence? no barriers identified Are meds synced by current pharmacy? Yes  Are meds delivered by current pharmacy? Yes  Does patient experience delays in picking up medications due to transportation concerns? No   [facility] Reviewed: Is patient disadvantaged to use [facility]?: No  Current Rx  insurance plan: [employer] Name and location of Current pharmacy:  [facility] - [place], [place] - [place] [address] [place] [address] Phone: [phone] Fax: [phone]  [facility] services reviewed with patient today?: No  Patient requests to transfer care to [facility]?: No  Reason patient declined to change pharmacies: Patient is already actively enrolled with [facility]  Compliance/Adherence/Medication fill history: Care Gaps: AWV (last 09/02/21)  Star-Rating Drugs: Atorvastatin 80mg  PDC 97%   Assessment/Plan   COPD (Goal: control symptoms and prevent exacerbations) -Not ideally controlled -Current treatment  Xopenex 45 mcg 1-2 puffs q6h prn Appropriate, Effective, Safe, Accessible Anoro Ellipta 1 puff daily at 6am Appropriate, Effective, Safe, Query Accessible -Medications previously tried: Atrovent  -Gold Grade: No PFTs to determine -Current COPD Classification:  A (low sx, 0-1 moderate exacerbations, no hospitalizations) -MMRC/CAT score: Not performed today -Pulmonary function testing: N/A -Exacerbations requiring treatment in last 6 months: None -Patient denies consistent use of maintenance inhaler. Out currently due to temporary increased cost, has a deductible to meet. -Will pursue PAP for patient but awaiting income information to see if he qualifies -Frequency of rescue inhaler use: Cannot recall last time he had to use, is currently out, refill requested from pharmacy on patient behalf today -Counseled on Proper inhaler technique; Benefits of consistent maintenance inhaler use When to use rescue inhaler Differences between maintenance and rescue inhalers -Recommended to continue current medication -Awaiting patient response on son's income to see if patient qualifies for PAP  CAD (Goal: Slow progression of atherosclerosis (plaques / blockages) throughout your body to reduce risk of  heart attack and strokes) [place] controlled/uncontrolled: Controlled Current Medication Therapy:  Aspirin 81mg  q qd Appropriate, Effective, Safe, AccessiblE  Clopidogrel 75mg  1 qd Appropriate, Effective, Safe, AccessiblE  Nitroglycerin 0.4mg  prn Appropriate, Effective, Safe, Accessible  Medications Previously Tried: None Chest Pain in last 3 months: No Any signs of bleed? None Counseled on: -Continued medication use, monitor for signs of bleed, and counseled on signs of MI/stroke  [place] Pharmacist [phone]

## 2022-10-09 ENCOUNTER — Telehealth: Payer: Self-pay

## 2022-10-09 ENCOUNTER — Ambulatory Visit: Payer: Medicare HMO

## 2022-10-09 NOTE — Telephone Encounter (Signed)
-----   Message from Maren Reamer, [facility] sent at 10/09/2022  1:53 PM EDT ----- Pt pharmacy unable to refill his xopenex due to backorder. Patient does not want to try to find at any other pharmacy. Pt is out completely.  Pt states he is willing to try albuterol hfa again if a new refill could be sent in to his preferred [facility]?  Thank you! [place] Pharmacist [phone]

## 2022-10-12 ENCOUNTER — Other Ambulatory Visit: Payer: Self-pay | Admitting: Family Medicine

## 2022-10-12 MED ORDER — ALBUTEROL SULFATE HFA 108 (90 BASE) MCG/ACT IN AERS: 1.0000 | INHALATION_SPRAY | Freq: Four times a day (QID) | RESPIRATORY_TRACT | 1 refills | Status: DC | PRN

## 2022-10-12 NOTE — Telephone Encounter (Signed)
He is okay to try albuterol inhaler again, 1 to 2 puff every 4-6 hours as needed. Prescription sent. Thank, BJ

## 2022-11-02 ENCOUNTER — Telehealth: Payer: Self-pay

## 2022-11-02 NOTE — Progress Notes (Signed)
Patient ID: Derrick Stokes, male   DOB: [birth year], 72 y.o.   MRN: [id number]  Care Management & Coordination Services Pharmacy Team  Reason for Encounter: Medication coordination and delivery  Contacted patient to discuss medications and coordinate delivery from [facility]. Spoke with  Wife Debbie  on 11/02/2022  Cycle dispensing form sent to [facility] for review.   Last adherence delivery date:10/13/22      Patient is due for next adherence delivery on: 11/12/22  This delivery to include: Adherence Packaging  30 Days  Duloxetine (Cymbalta) 30 mg: take one capsule at breakfast Ezetimibe (Zetia) 10 mg: take one tablet at breakfast Pantoprazole (Protonix) 40 mg: take one tablet at breakfast Atorvastatin (Lipitor)  80 mg: take one tablet at breakfast Clopidogrel (Plavix) 75 mg : take one tablet at breakfast Metoprolol Succinate (Toprol)  50 mg : take one tablet at breakfast Fish oil 1000 mg: take one capsule at breakfast  Patient declined the following medications this month: None  No refill request needed.  Confirmed delivery date of 11/12/22, advised patient that pharmacy will contact them the morning of delivery.   Any concerns about your medications? No  How often do you forget or accidentally miss a dose? Never  Do you use a pillbox? No  Is patient in packaging Yes  Wife reports she is still in process of getting sons income information for Anoro PAP.  Chart review: Recent office visits:  None  Recent consult visits:  None  Hospital visits:  None in previous 6 months  Medications: Outpatient Encounter Medications as of 11/02/2022  Medication Sig   albuterol (VENTOLIN HFA) 108 (90 Base) MCG/ACT inhaler Inhale 1-2 puffs into the lungs every 6 (six) hours as needed for wheezing or shortness of breath.   aspirin EC 81 MG tablet Take 81 mg by mouth at bedtime.    atorvastatin (LIPITOR) 80 MG tablet Take 1 tablet (80 mg total) by mouth every morning.    clopidogrel (PLAVIX) 75 MG tablet TAKE ONE TABLET BY MOUTH EVERY MORNING   DULoxetine (CYMBALTA) 60 MG capsule Take 1 capsule (60 mg total) by mouth daily.   ezetimibe (ZETIA) 10 MG tablet Take 1 tablet (10 mg total) by mouth daily.   metoprolol succinate (TOPROL-XL) 50 MG 24 hr tablet TAKE ONE TABLET BY MOUTH ONCE DAILY WITH A MEAL   nitroGLYCERIN (NITROSTAT) 0.4 MG SL tablet Place 1 tablet (0.4 mg total) under the tongue every 5 (five) minutes as needed for chest pain. PLACE 1 TABLET UNDER THE TONGUE EVERY 5 MINUTES X 3 DOSES AS NEEDED FOR CHEST PAIN.   Omega-3 Fatty Acids (FISH OIL) 1000 MG CPDR Take 1,000 mg by mouth daily.    pantoprazole (PROTONIX) 40 MG tablet TAKE ONE TABLET BY MOUTH ONCE DAILY   umeclidinium-vilanterol (ANORO ELLIPTA) 62.5-25 MCG/ACT AEPB Inhale 1 puff into the lungs daily at 6 (six) AM.   vitamin B-12 (CYANOCOBALAMIN) 1000 MCG tablet Take 1,000 mcg by mouth daily.    No facility-administered encounter medications on file as of 11/02/2022.   BP Readings from Last 3 Encounters:  08/24/22 128/80  06/23/22 110/70  12/08/21 130/82    Pulse Readings from Last 3 Encounters:  08/24/22 73  06/23/22 (!) 56  12/08/21 64    Lab Results  Component Value Date/Time   HGBA1C 5.6 03/31/2021 09:30 AM   HGBA1C 5.3 09/14/2018 08:06 AM   Lab Results  Component Value Date   CREATININE 0.89 12/08/2021   BUN 10 12/08/2021  GFR 87.62 12/08/2021   GFRNONAA 95 03/08/2019   GFRAA 109 03/08/2019   NA 138 12/08/2021   K 4.9 12/08/2021   CALCIUM 9.7 12/08/2021   CO2 28 12/08/2021      Pamala Duffel CMA Clinical Pharmacist Assistant [phone]

## 2022-11-16 ENCOUNTER — Telehealth: Payer: Self-pay | Admitting: Family Medicine

## 2022-11-16 NOTE — Telephone Encounter (Signed)
Called patient to schedule Medicare Annual Wellness Visit (AWV). Left message for patient to call back and schedule Medicare Annual Wellness Visit (AWV).  Last date of AWV: 09/02/21  Please schedule an appointment at any time with [facility] or hannah kim.  If any questions, please contact me at [phone].  Thank you ,  [name] AWV direct phone # [phone]

## 2022-11-19 ENCOUNTER — Telehealth: Payer: Self-pay | Admitting: Family Medicine

## 2022-11-19 NOTE — Telephone Encounter (Signed)
Contacted Derrick Stokes to schedule their annual wellness visit. Call back at later date: 12/2022  [name] AWV direct phone # [phone]   Spoke to spouse has a lot going on right now  please call back

## 2022-11-29 ENCOUNTER — Other Ambulatory Visit: Payer: Self-pay | Admitting: Nurse Practitioner

## 2022-11-29 DIAGNOSIS — E785 Hyperlipidemia, unspecified: Secondary | ICD-10-CM

## 2022-11-30 ENCOUNTER — Telehealth: Payer: Self-pay

## 2022-11-30 NOTE — Progress Notes (Unsigned)
Patient ID: Derrick Stokes, male   DOB: [birth year], 72 y.o.   MRN: [id number]  Care Management & Coordination Services Pharmacy Team  Reason for Encounter: Medication coordination and delivery  Contacted patient to discuss medications and coordinate delivery from [facility]. {US HC Outreach:28874} Cycle dispensing form sent to *** for review.   Last adherence delivery date:11/12/22       Patient is due for next adherence delivery on: 12/11/22  This delivery to include: Adherence Packaging  30 Days  Duloxetine (Cymbalta) 30 mg: take one capsule at breakfast Ezetimibe (Zetia) 10 mg: take one tablet at breakfast Pantoprazole (Protonix) 40 mg: take one tablet at breakfast Atorvastatin (Lipitor)  80 mg: take one tablet at breakfast Clopidogrel (Plavix) 75 mg : take one tablet at breakfast Metoprolol Succinate (Toprol)  50 mg : take one tablet at breakfast Fish oil 1000 mg: take one capsule at breakfast  Patient declined the following medications this month: ***  {refills needed:25320}  {Delivery WUJW:11914}   Any concerns about your medications? {yes/no:20286}  How often do you forget or accidentally miss a dose? {Missed doses:25554}  Do you use a pillbox? {yes/no:20286}  Is patient in packaging {yes/no:20286}  If yes  What is the date on your next pill pack?  Any concerns or issues with your packaging?   Recent blood pressure readings are as follows:***  Recent blood glucose readings are as follows:***   Chart review: Recent office visits:  None  Recent consult visits:  None  Hospital visits:  None in previous 6 months  Medications: Outpatient Encounter Medications as of 11/30/2022  Medication Sig   albuterol (VENTOLIN HFA) 108 (90 Base) MCG/ACT inhaler Inhale 1-2 puffs into the lungs every 6 (six) hours as needed for wheezing or shortness of breath.   aspirin EC 81 MG tablet Take 81 mg by mouth at bedtime.    atorvastatin (LIPITOR) 80 MG tablet TAKE ONE  TABLET BY MOUTH EVERY MORNING   clopidogrel (PLAVIX) 75 MG tablet TAKE ONE TABLET BY MOUTH EVERY MORNING   DULoxetine (CYMBALTA) 60 MG capsule Take 1 capsule (60 mg total) by mouth daily.   ezetimibe (ZETIA) 10 MG tablet TAKE ONE TABLET BY MOUTH ONCE DAILY   metoprolol succinate (TOPROL-XL) 50 MG 24 hr tablet TAKE ONE TABLET BY MOUTH ONCE DAILY WITH A MEAL   nitroGLYCERIN (NITROSTAT) 0.4 MG SL tablet Place 1 tablet (0.4 mg total) under the tongue every 5 (five) minutes as needed for chest pain. PLACE 1 TABLET UNDER THE TONGUE EVERY 5 MINUTES X 3 DOSES AS NEEDED FOR CHEST PAIN.   Omega-3 Fatty Acids (FISH OIL) 1000 MG CPDR Take 1,000 mg by mouth daily.    pantoprazole (PROTONIX) 40 MG tablet TAKE ONE TABLET BY MOUTH ONCE DAILY   umeclidinium-vilanterol (ANORO ELLIPTA) 62.5-25 MCG/ACT AEPB Inhale 1 puff into the lungs daily at 6 (six) AM.   vitamin B-12 (CYANOCOBALAMIN) 1000 MCG tablet Take 1,000 mcg by mouth daily.    No facility-administered encounter medications on file as of 11/30/2022.   BP Readings from Last 3 Encounters:  08/24/22 128/80  06/23/22 110/70  12/08/21 130/82    Pulse Readings from Last 3 Encounters:  08/24/22 73  06/23/22 (!) 56  12/08/21 64    Lab Results  Component Value Date/Time   HGBA1C 5.6 03/31/2021 09:30 AM   HGBA1C 5.3 09/14/2018 08:06 AM   Lab Results  Component Value Date   CREATININE 0.89 12/08/2021   BUN 10 12/08/2021   GFR 87.62 12/08/2021  GFRNONAA 95 03/08/2019   GFRAA 109 03/08/2019   NA 138 12/08/2021   K 4.9 12/08/2021   CALCIUM 9.7 12/08/2021   CO2 28 12/08/2021        Pamala Duffel CMA Clinical Pharmacist Assistant [phone]

## 2022-12-11 NOTE — Progress Notes (Signed)
  [facility] info for [employer] ID # [id number] BIN: [id number] PCI: [id number]  Group: [id number]  Call to pt' s wife and made her aware of the above as well as pharmacy to fill medication for pt.   Pamala Duffel CMA Clinical Pharmacist Assistant [phone]

## 2022-12-14 ENCOUNTER — Other Ambulatory Visit: Payer: Self-pay | Admitting: Acute Care

## 2022-12-14 DIAGNOSIS — Z87891 Personal history of nicotine dependence: Secondary | ICD-10-CM

## 2022-12-14 DIAGNOSIS — F17210 Nicotine dependence, cigarettes, uncomplicated: Secondary | ICD-10-CM

## 2022-12-14 DIAGNOSIS — Z122 Encounter for screening for malignant neoplasm of respiratory organs: Secondary | ICD-10-CM

## 2023-01-08 NOTE — Progress Notes (Deleted)
  Cardiology Office Note:    Date:  01/08/2023  ID:  Derrick Stokes, DOB [birth year], MRN [id number] PCP: [place], [name], MD  [facility] Providers Cardiologist:  Tonny Bollman, MD { Click to update primary MD,subspecialty MD or APP then REFRESH:1}    {Click to Open Review  :1}   Patient Profile:      Coronary artery disease s/p CABG [place] 2016:  S-OM, S-RCA 100; L-LAD, S-Dx ok; PCI: 2.75 x 28 mm DES to LCx (leading into OM2); 3.5 x 24 mm DES to RCA TTE 05/02/2015: EF 50-55%, normal wall motion, grade 1 diastolic dysfunction, mild MR, normal RVSF, mild TR  Chest pain >> Cath 03/17/18: Distal LCx stent patent with 50 ISR, RCA stent patent, ostial LAD 100, SVG-RCA 100, SVG-LCx 100, LIMA-LAD patent, SVG-D1 patent, EF 55-65 LHC 10/16/2020: Proximal LAD 100 CTO, LCx stent patent with moderate ISR, OM1 patent, RCA stent patent with mild ISR, SVG-D1, LIMA-LAD patent, SVG-RCA and SVG-LCx occluded>> medical therapy Hyperlipidemia Tobacco use Lung nodules  Peripheral neuropathy ABIs 07/2018: normal AAA [place] 09/27/2018: No AAA ABIs 12/31/2021: Normal bilaterally Hepatic steatosis      History of Present Illness:   Derrick Stokes is a 72 y.o. male who returns for follow-up of CAD.  He was last seen 12/04/2021 by Eligha Bridegroom, NP.  ROS ***    Studies Reviewed:        *** Risk Assessment/Calculations:   {Does this patient have ATRIAL FIBRILLATION?[phone]} No BP recorded.  {Refresh Note OR Click here to enter BP  :1}***       Physical Exam:   VS:  There were no vitals taken for this visit.   Wt Readings from Last 3 Encounters:  08/24/22 188 lb 2 oz (85.3 kg)  06/23/22 186 lb 4 oz (84.5 kg)  12/08/21 182 lb (82.6 kg)    Physical Exam***    ASSESSMENT AND PLAN:   No problem-specific Assessment & Plan notes found for this encounter. ***    {Are you ordering a CV Procedure (e.g. stress test, cath, DCCV, TEE, etc)?   Press F2        :[id number]}  Dispo:  No follow-ups on  file.  Signed, Tereso Newcomer, PA-C

## 2023-01-11 ENCOUNTER — Other Ambulatory Visit: Payer: Self-pay | Admitting: Nurse Practitioner

## 2023-01-11 DIAGNOSIS — E785 Hyperlipidemia, unspecified: Secondary | ICD-10-CM

## 2023-01-12 ENCOUNTER — Ambulatory Visit: Payer: Medicare HMO | Admitting: Physician Assistant

## 2023-01-12 DIAGNOSIS — I25118 Atherosclerotic heart disease of native coronary artery with other forms of angina pectoris: Secondary | ICD-10-CM

## 2023-01-13 ENCOUNTER — Inpatient Hospital Stay: Admission: RE | Admit: 2023-01-13 | Payer: Medicare HMO | Source: Ambulatory Visit

## 2023-01-19 NOTE — Progress Notes (Deleted)
HPI: [name] is a 72 y.o. male, who is here today for chronic disease management.  Last seen on 08/24/22  *** Review of Systems See other pertinent positives and negatives in HPI.  Current Outpatient Medications on File Prior to Visit  Medication Sig Dispense Refill   albuterol (VENTOLIN HFA) 108 (90 Base) MCG/ACT inhaler Inhale 1-2 puffs into the lungs every 6 (six) hours as needed for wheezing or shortness of breath. 8 g 1   aspirin EC 81 MG tablet Take 81 mg by mouth at bedtime.      atorvastatin (LIPITOR) 80 MG tablet Take 1 tablet (80 mg total) by mouth every morning. Please keep scheduled appointment for future refills. Thank you. 30 tablet 0   clopidogrel (PLAVIX) 75 MG tablet TAKE ONE TABLET BY MOUTH EVERY MORNING. Please keep scheduled appointment for future refills. Thank you. 30 tablet 0   DULoxetine (CYMBALTA) 60 MG capsule Take 1 capsule (60 mg total) by mouth daily. 90 capsule 2   ezetimibe (ZETIA) 10 MG tablet Take 1 tablet (10 mg total) by mouth daily. Please keep scheduled appointment for future refills. Thank you. 30 tablet 0   metoprolol succinate (TOPROL-XL) 50 MG 24 hr tablet TAKE ONE TABLET BY MOUTH ONCE DAILY WITH A MEAL. Please keep scheduled appointment for future refills. Thank you. 30 tablet 0   nitroGLYCERIN (NITROSTAT) 0.4 MG SL tablet Place 1 tablet (0.4 mg total) under the tongue every 5 (five) minutes as needed for chest pain. PLACE 1 TABLET UNDER THE TONGUE EVERY 5 MINUTES X 3 DOSES AS NEEDED FOR CHEST PAIN. 25 tablet 3   Omega-3 Fatty Acids (FISH OIL) 1000 MG CPDR Take 1,000 mg by mouth daily.      pantoprazole (PROTONIX) 40 MG tablet Take 1 tablet (40 mg total) by mouth daily. Please keep scheduled appointment for future refills. Thank you. 30 tablet 0   umeclidinium-vilanterol (ANORO ELLIPTA) 62.5-25 MCG/ACT AEPB Inhale 1 puff into the lungs daily at 6 (six) AM. 60 each 5   vitamin B-12 (CYANOCOBALAMIN) 1000 MCG tablet Take 1,000 mcg by  mouth daily.      No current facility-administered medications on file prior to visit.    Past Medical History:  Diagnosis Date   Arthritis    B12 deficiency    Borderline   CAD (coronary artery disease)    a. s/p CABG 01/2011;  b. ETT Myoview 3/14:  Low risk;  c. 09/2014 Cath/PCI: LM nl, LAD 100p, LCX 26m/OM2 60-70p (2.75x28 Synergy DES), RCA dom, 66m (atherectomy, 3.5x24 Synergy DES), PDA nl, LIMA->LAD nl, VG->Diag nl, VG->OM 100, VG->RCA 100.   Cataract    bilateral sx    COPD (chronic obstructive pulmonary disease) (HCC)    GERD (gastroesophageal reflux disease)    on meds   Hiatal hernia    History of echocardiogram    Echo 10/16: EF 50-55%, no RWMA, Gr 1 DD, mild MR, mild TR   History of kidney stones    Passed   HLD (hyperlipidemia)    on meds   HOH (hard of hearing)    Hypertension    on meds   Leg pain    ABIs 4/14:  R 1.2, L 1.2, TBIs normal   Peripheral neuropathy    From trauma   Reactive airway disease 03/18/2018   Tobacco abuse    Allergies  Allergen Reactions   Iodine Nausea And Vomiting    Turns white in face and sweaty   Iohexol  Other (See Comments)    May have caused nausea and vomiting several years ago    Shrimp [Shellfish Allergy] Nausea And Vomiting    Turns white in face and sweaty   Isosorbide Dinitrate Other (See Comments)    headache    Social History   Socioeconomic History   Marital status: Married    Spouse name: Not on file   Number of children: Not on file   Years of education: Not on file   Highest education level: 10th grade  Occupational History   Occupation: [employer]  Tobacco Use   Smoking status: Every Day    Packs/day: 1.00    Years: 51.00    Additional pack years: 0.00    Total pack years: 51.00    Types: Cigarettes    Start date: 38    Passive exposure: Past   Smokeless tobacco: Never  Vaping Use   Vaping Use: Never used  Substance and Sexual Activity   Alcohol use: Yes    Alcohol/week: 3.0 standard drinks  of alcohol    Types: 3 Cans of beer per week   Drug use: No   Sexual activity: Yes  Other Topics Concern   Not on file  Social History Narrative   Married   Gets regular exercise - walks 1/4 mile per day 5 times a week without symptoms   Social Determinants of Health   Financial Resource Strain: Medium Risk (08/21/2022)   Overall Financial Resource Strain (CARDIA)    Difficulty of Paying Living Expenses: Somewhat hard  Food Insecurity: No Food Insecurity (10/09/2022)   Hunger Vital Sign    Worried About Running Out of Food in the Last Year: Never true    Ran Out of Food in the Last Year: Never true  Transportation Needs: No Transportation Needs (08/21/2022)   PRAPARE - Administrator, Civil Service (Medical): No    Lack of Transportation (Non-Medical): No  Physical Activity: Sufficiently Active (08/21/2022)   Exercise Vital Sign    Days of Exercise per Week: 5 days    Minutes of Exercise per Session: 30 min  Stress: No Stress Concern Present (08/21/2022)   [employer] of Occupational Health - Occupational Stress Questionnaire    Feeling of Stress : Only a little  Social Connections: Socially Isolated (08/21/2022)   Social Connection and Isolation Panel [NHANES]    Frequency of Communication with Friends and Family: Once a week    Frequency of Social Gatherings with Friends and Family: Never    Attends Religious Services: Never    Database administrator or Organizations: No    Attends Engineer, structural: Not on file    Marital Status: Married    There were no vitals filed for this visit. There is no height or weight on file to calculate BMI.  Physical Exam  ASSESSMENT AND PLAN:  There are no diagnoses linked to this encounter.  No orders of the defined types were placed in this encounter.   No problem-specific Assessment & Plan notes found for this encounter.   No follow-ups on file.  [name]. [place], MD  [facility]. [place]  office.

## 2023-01-20 ENCOUNTER — Ambulatory Visit: Payer: Medicare HMO | Admitting: Family Medicine

## 2023-01-20 DIAGNOSIS — I700 Atherosclerosis of aorta: Secondary | ICD-10-CM

## 2023-01-20 DIAGNOSIS — J449 Chronic obstructive pulmonary disease, unspecified: Secondary | ICD-10-CM

## 2023-01-20 DIAGNOSIS — I10 Essential (primary) hypertension: Secondary | ICD-10-CM

## 2023-01-20 DIAGNOSIS — I25118 Atherosclerotic heart disease of native coronary artery with other forms of angina pectoris: Secondary | ICD-10-CM

## 2023-02-05 ENCOUNTER — Other Ambulatory Visit: Payer: Self-pay | Admitting: Nurse Practitioner

## 2023-02-05 DIAGNOSIS — E785 Hyperlipidemia, unspecified: Secondary | ICD-10-CM

## 2023-03-08 ENCOUNTER — Telehealth: Payer: Self-pay

## 2023-03-08 ENCOUNTER — Other Ambulatory Visit: Payer: Self-pay | Admitting: Family Medicine

## 2023-03-08 DIAGNOSIS — G8929 Other chronic pain: Secondary | ICD-10-CM

## 2023-03-08 NOTE — Telephone Encounter (Signed)
Unsuccessful attempts to reach patient on preferred number listed in notes for scheduled AWV. Left message on voicemail okay to reschedule.

## 2023-03-17 ENCOUNTER — Ambulatory Visit: Payer: Medicare HMO | Admitting: Physician Assistant

## 2023-03-22 ENCOUNTER — Other Ambulatory Visit: Payer: Self-pay | Admitting: Nurse Practitioner

## 2023-03-22 ENCOUNTER — Other Ambulatory Visit: Payer: Self-pay | Admitting: Family Medicine

## 2023-03-22 DIAGNOSIS — E785 Hyperlipidemia, unspecified: Secondary | ICD-10-CM

## 2023-04-06 NOTE — Progress Notes (Signed)
ACUTE VISIT No chief complaint on file.  HPI: [name] is a 72 y.o. male with PMHx significant for CAD, HTN, GERD, HLD, and anxiety who is here today complaining of numbness in his feet and legs HPI  Review of Systems See other pertinent positives and negatives in HPI.  Current Outpatient Medications on File Prior to Visit  Medication Sig Dispense Refill   albuterol (VENTOLIN HFA) 108 (90 Base) MCG/ACT inhaler INHALE 1-2 PUFFS BY MOUTH INTO THE LUNGS EVERY 6 HOURS AS NEEDED FOR WHEEZING AND/OR FOR SHORTNESS OF BREATH 8.5 g 10   aspirin EC 81 MG tablet Take 81 mg by mouth at bedtime.      atorvastatin (LIPITOR) 80 MG tablet Take 1 tablet (80 mg total) by mouth every morning. 30 tablet 2   clopidogrel (PLAVIX) 75 MG tablet TAKE 1 TABLET BY MOUTH EVERY MORNING 30 tablet 2   DULoxetine (CYMBALTA) 60 MG capsule TAKE ONE CAPSULE BY MOUTH ONCE DAILY 90 capsule 1   ezetimibe (ZETIA) 10 MG tablet Take 1 tablet (10 mg total) by mouth daily. 30 tablet 2   metoprolol succinate (TOPROL-XL) 50 MG 24 hr tablet TAKE 1 TABLET BY MOUTH ONCE DAILY WITH A MEAL 30 tablet 2   nitroGLYCERIN (NITROSTAT) 0.4 MG SL tablet Place 1 tablet (0.4 mg total) under the tongue every 5 (five) minutes as needed for chest pain. PLACE 1 TABLET UNDER THE TONGUE EVERY 5 MINUTES X 3 DOSES AS NEEDED FOR CHEST PAIN. 25 tablet 3   Omega-3 Fatty Acids (FISH OIL) 1000 MG CPDR Take 1,000 mg by mouth daily.      pantoprazole (PROTONIX) 40 MG tablet Take 1 tablet (40 mg total) by mouth daily. 30 tablet 2   umeclidinium-vilanterol (ANORO ELLIPTA) 62.5-25 MCG/ACT AEPB Inhale 1 puff into the lungs daily at 6 (six) AM. 60 each 5   vitamin B-12 (CYANOCOBALAMIN) 1000 MCG tablet Take 1,000 mcg by mouth daily.      No current facility-administered medications on file prior to visit.    Past Medical History:  Diagnosis Date   Arthritis    B12 deficiency    Borderline   CAD (coronary artery disease)    a. s/p CABG 01/2011;  b.  ETT Myoview 3/14:  Low risk;  c. 09/2014 Cath/PCI: LM nl, LAD 100p, LCX 6m/OM2 60-70p (2.75x28 Synergy DES), RCA dom, 69m (atherectomy, 3.5x24 Synergy DES), PDA nl, LIMA->LAD nl, VG->Diag nl, VG->OM 100, VG->RCA 100.   Cataract    bilateral sx    COPD (chronic obstructive pulmonary disease) (HCC)    GERD (gastroesophageal reflux disease)    on meds   Hiatal hernia    History of echocardiogram    Echo 10/16: EF 50-55%, no RWMA, Gr 1 DD, mild MR, mild TR   History of kidney stones    Passed   HLD (hyperlipidemia)    on meds   HOH (hard of hearing)    Hypertension    on meds   Leg pain    ABIs 4/14:  R 1.2, L 1.2, TBIs normal   Peripheral neuropathy    From trauma   Reactive airway disease 03/18/2018   Tobacco abuse    Allergies  Allergen Reactions   Iodine Nausea And Vomiting    Turns white in face and sweaty   Iohexol Other (See Comments)    May have caused nausea and vomiting several years ago    Shrimp [Shellfish Allergy] Nausea And Vomiting    Turns white in  face and sweaty   Isosorbide Dinitrate Other (See Comments)    headache    Social History   Socioeconomic History   Marital status: Married    Spouse name: Not on file   Number of children: Not on file   Years of education: Not on file   Highest education level: 10th grade  Occupational History   Occupation: [employer]  Tobacco Use   Smoking status: Every Day    Current packs/day: 1.00    Average packs/day: 1 pack/day for 54.7 years (54.7 ttl pk-yrs)    Types: Cigarettes    Start date: 1970    Passive exposure: Past   Smokeless tobacco: Never  Vaping Use   Vaping status: Never Used  Substance and Sexual Activity   Alcohol use: Yes    Alcohol/week: 3.0 standard drinks of alcohol    Types: 3 Cans of beer per week   Drug use: No   Sexual activity: Yes  Other Topics Concern   Not on file  Social History Narrative   Married   Gets regular exercise - walks 1/4 mile per day 5 times a week without  symptoms   Social Determinants of Health   Financial Resource Strain: Medium Risk (08/21/2022)   Overall Financial Resource Strain (CARDIA)    Difficulty of Paying Living Expenses: Somewhat hard  Food Insecurity: No Food Insecurity (10/09/2022)   Hunger Vital Sign    Worried About Running Out of Food in the Last Year: Never true    Ran Out of Food in the Last Year: Never true  Transportation Needs: No Transportation Needs (08/21/2022)   PRAPARE - Administrator, Civil Service (Medical): No    Lack of Transportation (Non-Medical): No  Physical Activity: Sufficiently Active (08/21/2022)   Exercise Vital Sign    Days of Exercise per Week: 5 days    Minutes of Exercise per Session: 30 min  Stress: No Stress Concern Present (08/21/2022)   [employer] of Occupational Health - Occupational Stress Questionnaire    Feeling of Stress : Only a little  Social Connections: Socially Isolated (08/21/2022)   Social Connection and Isolation Panel [NHANES]    Frequency of Communication with Friends and Family: Once a week    Frequency of Social Gatherings with Friends and Family: Never    Attends Religious Services: Never    Database administrator or Organizations: No    Attends Engineer, structural: Not on file    Marital Status: Married    There were no vitals filed for this visit. There is no height or weight on file to calculate BMI.  Physical Exam  ASSESSMENT AND PLAN:  Derrick Stokes Stokes was seen today for numbness of the feet and legs.   There are no diagnoses linked to this encounter.  No follow-ups on file.  I,Derrick Stokes Stokes,acting as a scribe for Derrick Burnham [place], MD.,have documented all relevant documentation on the behalf of Derrick Delfavero [place], MD,as directed by  Derrick Dethloff [place], MD while in the presence of Derrick Grandpre [place], MD.  I, Derrick Stokes Stokes, have reviewed all documentation for this visit. The documentation on 04/06/23 for the exam, diagnosis, procedures, and orders are all  accurate and complete.  Derrick Stokes Griffing G. [place], MD  [facility]. [place] office.  Discharge Instructions   None

## 2023-04-07 ENCOUNTER — Encounter: Payer: Self-pay | Admitting: Family Medicine

## 2023-04-07 ENCOUNTER — Ambulatory Visit (INDEPENDENT_AMBULATORY_CARE_PROVIDER_SITE_OTHER): Payer: Medicare HMO | Admitting: Family Medicine

## 2023-04-07 VITALS — BP 126/80 | HR 82 | Resp 16 | Ht 67.5 in | Wt 177.5 lb

## 2023-04-07 DIAGNOSIS — M5441 Lumbago with sciatica, right side: Secondary | ICD-10-CM

## 2023-04-07 DIAGNOSIS — S0990XA Unspecified injury of head, initial encounter: Secondary | ICD-10-CM

## 2023-04-07 DIAGNOSIS — G8929 Other chronic pain: Secondary | ICD-10-CM | POA: Diagnosis not present

## 2023-04-07 DIAGNOSIS — R200 Anesthesia of skin: Secondary | ICD-10-CM | POA: Diagnosis not present

## 2023-04-07 DIAGNOSIS — J449 Chronic obstructive pulmonary disease, unspecified: Secondary | ICD-10-CM

## 2023-04-07 DIAGNOSIS — Z23 Encounter for immunization: Secondary | ICD-10-CM

## 2023-04-07 DIAGNOSIS — E538 Deficiency of other specified B group vitamins: Secondary | ICD-10-CM

## 2023-04-07 DIAGNOSIS — R296 Repeated falls: Secondary | ICD-10-CM | POA: Diagnosis not present

## 2023-04-07 DIAGNOSIS — M5442 Lumbago with sciatica, left side: Secondary | ICD-10-CM | POA: Diagnosis not present

## 2023-04-07 MED ORDER — PREDNISONE 20 MG PO TABS: ORAL_TABLET | ORAL | 0 refills | Status: DC

## 2023-04-07 MED ORDER — GABAPENTIN 300 MG PO CAPS: 300.0000 mg | ORAL_CAPSULE | Freq: Every day | ORAL | 1 refills | Status: DC

## 2023-04-07 NOTE — Patient Instructions (Addendum)
A few things to remember from today's visit:  Chronic obstructive pulmonary disease, unspecified COPD type (HCC)  Chronic bilateral low back pain with bilateral sciatica - Plan: gabapentin (NEURONTIN) 300 MG capsule  Bilateral leg numbness - Plan: predniSONE (DELTASONE) 20 MG tablet, CBC, Comprehensive metabolic panel, Vitamin B12  Traumatic injury of head, initial encounter - Plan: CT HEAD WO CONTRAST ( )  B12 deficiency - Plan: Vitamin B12 Take prednisone with breakfast. Gabapentin at bedtime. I need to see you back in 3 months. If leg numbness is wore, may need to see Dr Jylan Loeza Likes again.  You need to call pulmonologist's office to have chest CT re-scheduled.  [phone]  If you need refills for medications you take chronically, please call your pharmacy. Do not use My Chart to request refills or for acute issues that need immediate attention. If you send a my chart message, it may take a few days to be addressed, specially if I am not in the office.  Please be sure medication list is accurate. If a new problem present, please set up appointment sooner than planned today.

## 2023-04-08 ENCOUNTER — Telehealth: Payer: Self-pay | Admitting: Family Medicine

## 2023-04-08 DIAGNOSIS — G8929 Other chronic pain: Secondary | ICD-10-CM

## 2023-04-08 DIAGNOSIS — R200 Anesthesia of skin: Secondary | ICD-10-CM

## 2023-04-08 MED ORDER — UMECLIDINIUM-VILANTEROL 62.5-25 MCG/ACT IN AEPB: 1.0000 | INHALATION_SPRAY | Freq: Every day | RESPIRATORY_TRACT | 5 refills | Status: DC

## 2023-04-08 NOTE — Assessment & Plan Note (Signed)
Reports that problem is stable.current treatment is helping. Continue Anoro Ellipta 62.5-25 mcg 1 puff daily. Smoking cessation encouraged. Phone number to re-schedule chest CT for lung cancer screening given on AVS.

## 2023-04-08 NOTE — Assessment & Plan Note (Signed)
Continue same dose of B12 supplementation. Further recommendations will be given according to B12 result.

## 2023-04-08 NOTE — Assessment & Plan Note (Addendum)
He has seen Dr Kenan Moodie Likes. Last lumbar MRI in 2006, his health insurance did not authorize having another one. Currently on Cymbalta 60 mg daily. Gabapentin 300 mg added today. Some side effects discussed. F/U in 3 months.

## 2023-04-08 NOTE — Telephone Encounter (Signed)
Pt son is calling and the rx's needs to be sent to  [id number] [place],  - [address] AT [place] [address] Phone: [phone]  Fax: [phone]     predniSONE (DELTASONE) 20 MG tablet , gabapentin (NEURONTIN) 300 MG capsule  not exact pharm

## 2023-04-09 MED ORDER — GABAPENTIN 300 MG PO CAPS: 300.0000 mg | ORAL_CAPSULE | Freq: Every day | ORAL | 1 refills | Status: DC

## 2023-04-09 MED ORDER — PREDNISONE 20 MG PO TABS: ORAL_TABLET | ORAL | 0 refills | Status: AC

## 2023-04-09 NOTE — Telephone Encounter (Signed)
Rx's resent.

## 2023-04-23 ENCOUNTER — Other Ambulatory Visit: Payer: Self-pay | Admitting: Family Medicine

## 2023-04-23 DIAGNOSIS — R200 Anesthesia of skin: Secondary | ICD-10-CM

## 2023-04-27 ENCOUNTER — Other Ambulatory Visit: Payer: Self-pay | Admitting: Family Medicine

## 2023-04-27 DIAGNOSIS — G8929 Other chronic pain: Secondary | ICD-10-CM

## 2023-06-04 ENCOUNTER — Ambulatory Visit (INDEPENDENT_AMBULATORY_CARE_PROVIDER_SITE_OTHER): Payer: Medicare HMO | Admitting: Family Medicine

## 2023-06-04 ENCOUNTER — Encounter: Payer: Self-pay | Admitting: Family Medicine

## 2023-06-04 VITALS — BP 110/80 | HR 79 | Resp 16 | Ht 67.5 in | Wt 176.0 lb

## 2023-06-04 DIAGNOSIS — M5416 Radiculopathy, lumbar region: Secondary | ICD-10-CM

## 2023-06-04 DIAGNOSIS — E538 Deficiency of other specified B group vitamins: Secondary | ICD-10-CM | POA: Diagnosis not present

## 2023-06-04 DIAGNOSIS — R200 Anesthesia of skin: Secondary | ICD-10-CM

## 2023-06-04 DIAGNOSIS — I25119 Atherosclerotic heart disease of native coronary artery with unspecified angina pectoris: Secondary | ICD-10-CM | POA: Diagnosis not present

## 2023-06-04 DIAGNOSIS — R413 Other amnesia: Secondary | ICD-10-CM

## 2023-06-04 DIAGNOSIS — G8929 Other chronic pain: Secondary | ICD-10-CM

## 2023-06-04 LAB — COMPREHENSIVE METABOLIC PANEL
ALT: 20 U/L (ref 0–53)
AST: 21 U/L (ref 0–37)
Albumin: 4.3 g/dL (ref 3.5–5.2)
Alkaline Phosphatase: 85 U/L (ref 39–117)
BUN: 7 mg/dL (ref 6–23)
CO2: 26 meq/L (ref 19–32)
Calcium: 9.7 mg/dL (ref 8.4–10.5)
Chloride: 101 meq/L (ref 96–112)
Creatinine, Ser: 0.77 mg/dL (ref 0.40–1.50)
GFR: 90.58 mL/min (ref >60.00)
Glucose, Bld: 65 mg/dL — ABNORMAL LOW (ref 70–99)
Potassium: 3.8 meq/L (ref 3.5–5.1)
Sodium: 135 meq/L (ref 135–145)
Total Bilirubin: 0.8 mg/dL (ref 0.2–1.2)
Total Protein: 7.5 g/dL (ref 6.0–8.3)

## 2023-06-04 LAB — CBC
HCT: 45.9 % (ref 39.0–52.0)
Hemoglobin: 15.4 g/dL (ref 13.0–17.0)
MCHC: 33.6 g/dL (ref 30.0–36.0)
MCV: 95.4 fL (ref 78.0–100.0)
Platelets: 249 10*3/uL (ref 150.0–400.0)
RBC: 4.81 Mil/uL (ref 4.22–5.81)
RDW: 12.8 % (ref 11.5–15.5)
WBC: 9.4 10*3/uL (ref 4.0–10.5)

## 2023-06-04 LAB — VITAMIN B12: Vitamin B-12: 288 pg/mL (ref 211–911)

## 2023-06-04 NOTE — Progress Notes (Addendum)
ACUTE VISIT Chief Complaint  Patient presents with   Leg Pain    Getting worse    HPI: [name] is a 72 y.o. male with a PMHx significant for CAD, HTN, GERD, HLD, and anxiety, who is here today complaining of leg pain.  He was last seen for this problem 04/07/23, Gabapentin was recommended. He is also on Duloxetine 60 mg daily.   He says it begins as a warm sensation on his legs, and then turns into a terrible stabbing pain. He also says he will occasionally have sharp pain in his lower legs.  He says the pain is worse at night, and is interfering with his sleep. If he gets out of bed to walk around, it dissipates, but returns quickly when he lays back down.  He also complains of difficulty walking and foot cramps in the morning.  He mentions he has not been taking his gabapentin 300 mg consistently, but it helps when he does take it.  Lumbar radiculopathy: He has seen neurosurgeon in the past and according to pt, he was told it was unlikely to improve.  Lumbar MRI has been ordered a couple years ago but his health insurance did not approve it.  He says he has had several falls since his last visit. The last one was 2 days ago.  He says his legs will go numb while he is standing or walking, and then he will fall.  He has a cane, but does not to use it.  Negative for saddle anesthesia or changes in bladder/bowel function.  Memory issues also reported last visit. Head CT was ordered, he has not had it done. Problem stable for a while.  He can not remember where he places tools. He does not drive. B12 deficiency: He cannot remember if he is taking B12 supplementation.   He also reports he had a recent episode of chest pain with associated left arm numbness while he was watching television. He has an appointment with cardiology on 11/18. He takes nitroglycerine sl as needed. No associated SOB,palpitations,or diaphoresis. + Smoker. Has not had chest low density CT for lung  cancer screening. He is on Atorvastatin 80 mg daily, Zetia 10 mg daily, Plavix 75 mg daily,and Aspirin 81 mg daily. HTN on Metoprolol Succinate 50 mg daily.  Review of Systems  Constitutional:  Positive for fatigue. Negative for chills and fever.  HENT:  Negative for sore throat and trouble swallowing.   Respiratory:  Negative for cough and wheezing.   Gastrointestinal:  Negative for abdominal pain, nausea and vomiting.  Endocrine: Negative for cold intolerance and heat intolerance.  Genitourinary:  Negative for decreased urine volume, dysuria and hematuria.  Skin:  Negative for rash.  Neurological:  Negative for syncope and headaches.  See other pertinent positives and negatives in HPI.  Current Outpatient Medications on File Prior to Visit  Medication Sig Dispense Refill   albuterol (VENTOLIN HFA) 108 (90 Base) MCG/ACT inhaler INHALE 1-2 PUFFS BY MOUTH INTO THE LUNGS EVERY 6 HOURS AS NEEDED FOR WHEEZING AND/OR FOR SHORTNESS OF BREATH 8.5 g 10   aspirin EC 81 MG tablet Take 81 mg by mouth at bedtime.      atorvastatin (LIPITOR) 80 MG tablet Take 1 tablet (80 mg total) by mouth every morning. 30 tablet 2   clopidogrel (PLAVIX) 75 MG tablet TAKE 1 TABLET BY MOUTH EVERY MORNING 30 tablet 2   DULoxetine (CYMBALTA) 60 MG capsule TAKE ONE CAPSULE BY MOUTH ONCE DAILY  90 capsule 1   ezetimibe (ZETIA) 10 MG tablet Take 1 tablet (10 mg total) by mouth daily. 30 tablet 2   gabapentin (NEURONTIN) 300 MG capsule TAKE 1 CAPSULE BY MOUTH AT BEDTIME 30 capsule 5   metoprolol succinate (TOPROL-XL) 50 MG 24 hr tablet TAKE 1 TABLET BY MOUTH ONCE DAILY WITH A MEAL 30 tablet 2   nitroGLYCERIN (NITROSTAT) 0.4 MG SL tablet Place 1 tablet (0.4 mg total) under the tongue every 5 (five) minutes as needed for chest pain. PLACE 1 TABLET UNDER THE TONGUE EVERY 5 MINUTES X 3 DOSES AS NEEDED FOR CHEST PAIN. 25 tablet 3   Omega-3 Fatty Acids (FISH OIL) 1000 MG CPDR Take 1,000 mg by mouth daily.      pantoprazole  (PROTONIX) 40 MG tablet Take 1 tablet (40 mg total) by mouth daily. 30 tablet 2   umeclidinium-vilanterol (ANORO ELLIPTA) 62.5-25 MCG/ACT AEPB Inhale 1 puff into the lungs daily at 6 (six) AM. 60 each 5   vitamin B-12 (CYANOCOBALAMIN) 1000 MCG tablet Take 1,000 mcg by mouth daily.      No current facility-administered medications on file prior to visit.    Past Medical History:  Diagnosis Date   Arthritis    B12 deficiency    Borderline   CAD (coronary artery disease)    a. s/p CABG 01/2011;  b. ETT Myoview 3/14:  Low risk;  c. 09/2014 Cath/PCI: LM nl, LAD 100p, LCX 58m/OM2 60-70p (2.75x28 Synergy DES), RCA dom, 62m (atherectomy, 3.5x24 Synergy DES), PDA nl, LIMA->LAD nl, VG->Diag nl, VG->OM 100, VG->RCA 100.   Cataract    bilateral sx    COPD (chronic obstructive pulmonary disease) (HCC)    GERD (gastroesophageal reflux disease)    on meds   Hiatal hernia    History of echocardiogram    Echo 10/16: EF 50-55%, no RWMA, Gr 1 DD, mild MR, mild TR   History of kidney stones    Passed   HLD (hyperlipidemia)    on meds   HOH (hard of hearing)    Hypertension    on meds   Leg pain    ABIs 4/14:  R 1.2, L 1.2, TBIs normal   Peripheral neuropathy    From trauma   Reactive airway disease 03/18/2018   Tobacco abuse    Allergies  Allergen Reactions   Iodine Nausea And Vomiting    Turns white in face and sweaty   Iohexol Other (See Comments)    May have caused nausea and vomiting several years ago    Shrimp [Shellfish Allergy] Nausea And Vomiting    Turns white in face and sweaty   Isosorbide Dinitrate Other (See Comments)    headache    Social History   Socioeconomic History   Marital status: Married    Spouse name: Not on file   Number of children: Not on file   Years of education: Not on file   Highest education level: 10th grade  Occupational History   Occupation: Di[employer]ability  Tobacco Use   Smoking status: Every Day    Current packs/day: 1.00    Average packs/day: 1  pack/day for 54.9 years (54.9 ttl pk-yrs)    Types: Cigarettes    Start date: 1970    Passive exposure: Past   Smokeless tobacco: Never  Vaping Use   Vaping status: Never Used  Substance and Sexual Activity   Alcohol use: Yes    Alcohol/week: 3.0 standard drinks of alcohol    Types: 3 Cans  of beer per week   Drug use: No   Sexual activity: Yes  Other Topics Concern   Not on file  Social History Narrative   Married   Gets regular exercise - walks 1/4 mile per day 5 times a week without symptoms   Social Determinants of Health   Financial Resource Strain: Medium Risk (08/21/2022)   Overall Financial Resource Strain (CARDIA)    Difficulty of Paying Living Expenses: Somewhat hard  Food Insecurity: No Food Insecurity (10/09/2022)   Hunger Vital Sign    Worried About Running Out of Food in the Last Year: Never true    Ran Out of Food in the Last Year: Never true  Transportation Needs: No Transportation Needs (08/21/2022)   PRAPARE - Administrator, Civil Service (Medical): No    Lack of Transportation (Non-Medical): No  Physical Activity: Sufficiently Active (08/21/2022)   Exercise Vital Sign    Days of Exercise per Week: 5 days    Minutes of Exercise per Session: 30 min  Stress: No Stress Concern Present (08/21/2022)   [employer] of Occupational Health - Occupational Stress Questionnaire    Feeling of Stress : Only a little  Social Connections: Socially Isolated (08/21/2022)   Social Connection and Isolation Panel [NHANES]    Frequency of Communication with Friends and Family: Once a week    Frequency of Social Gatherings with Friends and Family: Never    Attends Religious Services: Never    Database administrator or Organizations: No    Attends Engineer, structural: Not on file    Marital Status: Married    Vitals:   06/04/23 1403  BP: 110/80  Pulse: 79  Resp: 16  SpO2: 95%   Body mass index is 27.16 kg/m.  Physical Exam Vitals and nursing note  reviewed.  Constitutional:      General: He is not in acute distress.    Appearance: He is well-developed.  HENT:     Head: Normocephalic and atraumatic.  Eyes:     Conjunctiva/sclera: Conjunctivae normal.  Cardiovascular:     Rate and Rhythm: Normal rate and regular rhythm.     Heart sounds: No murmur heard.    Comments: DP pulses palpable. Pulmonary:     Effort: Pulmonary effort is normal. No respiratory distress.     Breath sounds: Normal breath sounds.  Abdominal:     Palpations: Abdomen is soft. There is no mass.     Tenderness: There is no abdominal tenderness.  Musculoskeletal:     Lumbar back: No tenderness.     Right lower leg: No edema.     Left lower leg: No edema.  Skin:    General: Skin is warm.     Findings: No erythema or rash.  Neurological:     Mental Status: He is alert.     Deep Tendon Reflexes:     Reflex Scores:      Achilles reflexes are 0 on the right side and 0 on the left side.    Comments: Antalgic and mildly unstable gait, not assisted.  Achilles tendon reflex absent bilaterally. L4 dermatome sensation RLE decreased.  He incorrectly identifies today's date, and has some difficulty recalling the month and year.  Oriented in place and person.  Psychiatric:        Mood and Affect: Mood and affect normal.   ASSESSMENT AND PLAN:  Mr. Manahan was seen today for leg pain.  Lab Results  Component Value Date  ZOXWRUEA54 288 06/04/2023   Lab Results  Component Value Date   NA 135 06/04/2023   CL 101 06/04/2023   K 3.8 06/04/2023   CO2 26 06/04/2023   BUN 7 06/04/2023   CREATININE 0.77 06/04/2023   GFR 90.58 06/04/2023   CALCIUM 9.7 06/04/2023   PHOS 3.7 01/29/2011   ALBUMIN 4.3 06/04/2023   GLUCOSE 65 (L) 06/04/2023   Lab Results  Component Value Date   ALT 20 06/04/2023   AST 21 06/04/2023   ALKPHOS 85 06/04/2023   BILITOT 0.8 06/04/2023   Lab Results  Component Value Date   WBC 9.4 06/04/2023   HGB 15.4 06/04/2023   HCT 45.9  06/04/2023   MCV 95.4 06/04/2023   PLT 249.0 06/04/2023   Lumbar radiculopathy Problem is getting worse. He has not taken Gabapentin, recommend resuming medication. He has Gabapentin 300 mg at bedtime, some side effects discussed. Continue Duloxetine 60 mg daily. Fall precautions discussed, he is not interested in PT due to cost. He has seen Dr Dutch Quint, neurosurgeon. Lumbar MRI order placed.  -     MR LUMBAR SPINE WO CONTRAST; Future  Bilateral leg numbness Radicular pain, other possible etiologies discussed.  -     Vitamin B12 -     Comprehensive metabolic panel -     CBC  B12 deficiency Assessment & Plan: He is not sure if he is on B12 supplementation. Further recommendations according to B12 result.  Orders: -     Vitamin B12  Coronary artery disease involving native coronary artery of native heart with angina pectoris (HCC) Assessment & Plan: S/P CABG x 5.  Reporting an episode of chest pain a few days ago with associated LUE numbness sensation.. Currently on Metoprolol succinate, Atorvastatin,Zetia,Plavix,and Aspirin. He has an appt with his cardiologist this Monday. Encouraged smoking cessation. Clearly instructed about warning signs.  Memory difficulties Problem has been going on for sometime. Possible etiologies discussed. Recommend re-scheduling head CT. Cognitive challenging exercises recommended.  Return if symptoms worsen or fail to improve, for keep next appointment.  I, Rolla Etienne Wierda, acting as a scribe for Jakim Drapeau [place], MD., have documented all relevant documentation on the behalf of Jahzara Slattery [place], MD, as directed by  Enjoli Tidd [place], MD while in the presence of Deeanne Deininger [place], MD.   I, Mattison Golay [place], MD, have reviewed all documentation for this visit. The documentation on 06/05/23 for the exam, diagnosis, procedures, and orders are all accurate and complete.  Zaniyah Wernette G. [place], MD  [facility]. [place] office.

## 2023-06-04 NOTE — Patient Instructions (Addendum)
A few things to remember from today's visit:  Chronic bilateral low back pain with bilateral sciatica  Lumbar radiculopathy  Bilateral leg numbness - Plan: Vitamin B12, Comprehensive metabolic panel, CBC  B12 deficiency - Plan: Vitamin B12 Start taking Gabapentin at bedtime. Will try to arrange lumbar MRI. Start taking B12. Please arrange lung and head CT. Keep appt with your cardiologist.  If you need refills for medications you take chronically, please call your pharmacy. Do not use My Chart to request refills or for acute issues that need immediate attention. If you send a my chart message, it may take a few days to be addressed, specially if I am not in the office.  Please be sure medication list is accurate. If a new problem present, please set up appointment sooner than planned today.

## 2023-06-05 NOTE — Assessment & Plan Note (Signed)
He is not sure if he is on B12 supplementation. Further recommendations according to B12 result.

## 2023-06-05 NOTE — Assessment & Plan Note (Addendum)
S/P CABG x 5.  Reporting an episode of chest pain a few days ago with associated LUE numbness sensation.. Currently on Metoprolol succinate, Atorvastatin,Zetia,Plavix,and Aspirin. He has an appt with his cardiologist this Monday. Encouraged smoking cessation. Clearly instructed about warning signs.

## 2023-06-06 NOTE — Progress Notes (Unsigned)
Cardiology Office Note:    Date:  06/07/2023  ID:  Derrick Stokes, DOB 12/18/1950, MRN [id number] PCP: [place], [name], MD  [facility] Providers Cardiologist:  Tonny Bollman, MD       Patient Profile:      Coronary artery disease s/p CABG 2012 [place] 2016:  S-OM, S-RCA 100; L-LAD, S-Dx ok; >> PCI:  DES to LCx (leading into OM2); DES to RCA Chest pain >> Cath 8/19: 2/4 grafts patent; LCx and RCA stents patent Cardiac catheterization: Ostial LAD 100%, Ostial LCX stent patent with 50% ISR, proximal RCA stent patent with 30% ISR, SVG-RCA 100%, LIMA-LAD patent, SVG-D1 patent, SVG-LCX occluded (10/16/2020) >> no targets for PCI, med Rx Hyperlipidemia Hypertension  Tobacco use Peripheral neuropathy ABIs 12/2021: normal          History of Present Illness:  Discussed the use of AI scribe software for clinical note transcription with the patient, who gave verbal consent to proceed.  Derrick Stokes is a 72 y.o. male who returns for follow up of CAD. He was last seen in clinic by Eligha Bridegroom, NP in 11/2021.  He is here alone.  Unfortunately, his wife passed away this past summer.  He notes occasional chest discomfort and chronic shortness of breath.  He also notes occasional left arm discomfort.  His symptoms typically occur at rest.  He has not had exertional symptoms.  He does note chronic shortness of breath which seems to be stable.  He has not had orthopnea, leg edema.  He notes a syncopal episode this past summer.  He was in the extreme heat.  He had not been drinking fluids.  He felt better after hydrating.  There has been no recurrence.     Review of Systems  Musculoskeletal:  Positive for myalgias.  Gastrointestinal:  Negative for hematochezia.  Genitourinary:  Negative for hematuria.  See HPI     Studies Reviewed:   EKG Interpretation Date/Time:  Monday June 07 2023 07:55:50 EST Ventricular Rate:  58 PR Interval:  128 QRS Duration:  84 QT  Interval:  390 QTC Calculation: 382 R Axis:   44  Text Interpretation: Sinus bradycardia Incomplete right bundle branch block No significant change since last tracing Confirmed by Tereso Newcomer [id number]) on 06/07/2023 8:07:44 AM    Results - Chart review LABS Potassium: 3.8 (06/04/2023) Creatinine: 0.77 (06/04/2023) ALT: 20 (06/04/2023) LDL: 51 (12/08/2021) Triglycerides: 96 (12/08/2021) HDL: 41.2 (12/08/2021) Hemoglobin: 15.4 (06/04/2023)          Risk Assessment/Calculations:             Physical Exam:   VS:  BP 130/64   Pulse (!) 58   Ht 5\' 8"  (1.727 m)   Wt 175 lb 12.8 oz (79.7 kg)   SpO2 95%   BMI 26.73 kg/m    Wt Readings from Last 3 Encounters:  06/07/23 175 lb 12.8 oz (79.7 kg)  06/04/23 176 lb (79.8 kg)  04/07/23 177 lb 8 oz (80.5 kg)    Constitutional:      Appearance: Healthy appearance. Not in distress.  Neck:     Vascular: No carotid bruit. JVD normal.  Pulmonary:     Breath sounds: Normal breath sounds. No wheezing. No rales.  Cardiovascular:     Normal rate. Regular rhythm.     Murmurs: There is no murmur.  Edema:    Peripheral edema absent.  Abdominal:     Palpations: Abdomen is soft.  Assessment and Plan:   Assessment & Plan Coronary artery disease involving native coronary artery of native heart with angina pectoris (HCC) History of CABG in 2012, DES to the LCx and DES to the RCA in 2016.  Last cardiac catheterization in March 2022 with occluded vein graft to the RCA, occluded vein graft to the LCx, patent stent in the RCA, patent stent in the LCx, patent LIMA-LAD and patent vein graft to D1.  There were no targets for PCI medical therapy was continued.  He does note occasional chest discomfort as well as left arm discomfort.  He continues to smoke cigarettes.  I have recommended proceeding with stress testing as well as an echocardiogram given his history of syncope this past summer. -Lexiscan Myoview -Echocardiogram -Continue aspirin  81 mg daily, Lipitor 80 mg daily, Plavix and a 5 mg daily, Zetia 10 mg daily, Toprol to 50 mg daily, nitroglycerin as needed -Follow-up 1 year or sooner if stress testing/echocardiogram abnormal or symptoms progress Hyperlipidemia LDL goal <70 LDL has been optimal in the past.  He is not completely fasting this morning.  He notes significant bilateral leg pain.  This has been related to neuropathy in the past.  It seems to have gotten worse.  He has had normal ABIs in the past.  Question if he could be having myalgias from statin therapy.   -Obtain direct LDL.   -Hold atorvastatin x 2 weeks -If symptoms improved, refer to lipid clinic for PCSK9 inhibitor. -If no change in symptoms, resume atorvastatin 80 mg daily Essential hypertension Blood pressure controlled.  Continue Toprol-XL 50 mg daily. Tobacco abuse He continues to smoke.    Informed Consent   Shared Decision Making/Informed Consent The risks [chest pain, shortness of breath, cardiac arrhythmias, dizziness, blood pressure fluctuations, myocardial infarction, stroke/transient ischemic attack, nausea, vomiting, allergic reaction, radiation exposure, metallic taste sensation and life-threatening complications (estimated to be 1 in 10,000)], benefits (risk stratification, diagnosing coronary artery disease, treatment guidance) and alternatives of a nuclear stress test were discussed in detail with Derrick Stokes and he agrees to proceed.     Dispo:  Return in about 1 year (around 06/06/2024) for Routine Follow Up, w/ Dr. Excell Seltzer.  Signed, Tereso Newcomer, PA-C

## 2023-06-07 ENCOUNTER — Ambulatory Visit: Payer: Medicare HMO | Attending: Physician Assistant | Admitting: Physician Assistant

## 2023-06-07 ENCOUNTER — Encounter: Payer: Self-pay | Admitting: Physician Assistant

## 2023-06-07 VITALS — BP 130/64 | HR 58 | Ht 68.0 in | Wt 175.8 lb

## 2023-06-07 DIAGNOSIS — I2510 Atherosclerotic heart disease of native coronary artery without angina pectoris: Secondary | ICD-10-CM

## 2023-06-07 DIAGNOSIS — Z720 Tobacco use: Secondary | ICD-10-CM | POA: Diagnosis not present

## 2023-06-07 DIAGNOSIS — E785 Hyperlipidemia, unspecified: Secondary | ICD-10-CM | POA: Diagnosis not present

## 2023-06-07 DIAGNOSIS — I25119 Atherosclerotic heart disease of native coronary artery with unspecified angina pectoris: Secondary | ICD-10-CM

## 2023-06-07 DIAGNOSIS — I10 Essential (primary) hypertension: Secondary | ICD-10-CM

## 2023-06-07 LAB — LDL CHOLESTEROL, DIRECT: LDL Direct: 53 mg/dL (ref 0–99)

## 2023-06-07 NOTE — Assessment & Plan Note (Signed)
He continues to smoke.

## 2023-06-07 NOTE — Assessment & Plan Note (Signed)
History of CABG in 2012, DES to the LCx and DES to the RCA in 2016.  Last cardiac catheterization in March 2022 with occluded vein graft to the RCA, occluded vein graft to the LCx, patent stent in the RCA, patent stent in the LCx, patent LIMA-LAD and patent vein graft to D1.  There were no targets for PCI medical therapy was continued.  He does note occasional chest discomfort as well as left arm discomfort.  He continues to smoke cigarettes.  I have recommended proceeding with stress testing as well as an echocardiogram given his history of syncope this past summer. -Lexiscan Myoview -Echocardiogram -Continue aspirin 81 mg daily, Lipitor 80 mg daily, Plavix and a 5 mg daily, Zetia 10 mg daily, Toprol to 50 mg daily, nitroglycerin as needed -Follow-up 1 year or sooner if stress testing/echocardiogram abnormal or symptoms progress

## 2023-06-07 NOTE — Patient Instructions (Signed)
Medication Instructions:  HOLD Atorvastatin  for 2 weeks and if it helps with the burning in your legs, if it does not help then you can restart medication *If you need a refill on your cardiac medications before your next appointment, please call your pharmacy*   Lab Work: TODAY-DIRECT LDL If you have labs (blood work) drawn today and your tests are completely normal, you will receive your results only by: MyChart Message (if you have MyChart) OR A paper copy in the mail If you have any lab test that is abnormal or we need to change your treatment, we will call you to review the results.   Testing/Procedures: Your physician has requested that you have an echocardiogram. Echocardiography is a painless test that uses sound waves to create images of your heart. It provides your doctor with information about the size and shape of your heart and how well your heart's chambers and valves are working. This procedure takes approximately one hour. There are no restrictions for this procedure. Please do NOT wear cologne, perfume, aftershave, or lotions (deodorant is allowed). Please arrive 15 minutes prior to your appointment time.  Please note: We ask at that you not bring children with you during ultrasound (echo/ vascular) testing. Due to room size and safety concerns, children are not allowed in the ultrasound rooms during exams. Our front office staff cannot provide observation of children in our lobby area while testing is being conducted. An adult accompanying a patient to their appointment will only be allowed in the ultrasound room at the discretion of the ultrasound technician under special circumstances. We apologize for any inconvenience.  Your physician has requested that you have a lexiscan myoview. For further information please visit [email or web address] Please follow instruction sheet, as given.   Follow-Up: At [facility] you and your health needs are our priority.  As  part of our continuing mission to provide you with exceptional heart care, we have created designated Provider Care Teams.  These Care Teams include your primary Cardiologist (physician) and Advanced Practice Providers (APPs -  Physician Assistants and Nurse Practitioners) who all work together to provide you with the care you need, when you need it.  We recommend signing up for the patient portal called "MyChart".  Sign up information is provided on this After Visit Summary.  MyChart is used to connect with patients for Virtual Visits (Telemedicine).  Patients are able to view lab/test results, encounter notes, upcoming appointments, etc.  Non-urgent messages can be sent to your provider as well.   To learn more about what you can do with MyChart, go to [email or web address].    Your next appointment:   12 month(s)  Provider:   Tonny Bollman, MD  or Tessa Lerner, MD   Other Instructions

## 2023-06-09 ENCOUNTER — Telehealth (HOSPITAL_COMMUNITY): Payer: Self-pay | Admitting: *Deleted

## 2023-06-09 NOTE — Telephone Encounter (Signed)
Patient given detailed instructions per Myocardial Perfusion Study Information Sheet for the test on 06/14/2023 at 8:00. Patient notified to arrive 15 minutes early and that it is imperative to arrive on time for appointment to keep from having the test rescheduled.  If you need to cancel or reschedule your appointment, please call the office within 24 hours of your appointment. . Patient verbalized understanding.Derrick Stokes

## 2023-06-14 ENCOUNTER — Encounter (HOSPITAL_COMMUNITY): Payer: Medicare HMO

## 2023-06-15 ENCOUNTER — Telehealth (HOSPITAL_COMMUNITY): Payer: Self-pay | Admitting: *Deleted

## 2023-06-15 NOTE — Telephone Encounter (Signed)
Left message on voicemail per DPR in reference to upcoming appointment scheduled on 08/12/22 with detailed instructions given per Myocardial Perfusion Study Information Sheet for the test. LM to arrive 15 minutes early, and that it is imperative to arrive on time for appointment to keep from having the test rescheduled. If you need to cancel or reschedule your appointment, please call the office within 24 hours of your appointment. Failure to do so may result in a cancellation of your appointment, and a $50 no show fee. Phone number given for call back for any questions. Derrick Stokes

## 2023-06-21 ENCOUNTER — Other Ambulatory Visit: Payer: Self-pay | Admitting: Cardiovascular Disease

## 2023-06-21 DIAGNOSIS — E785 Hyperlipidemia, unspecified: Secondary | ICD-10-CM

## 2023-06-23 ENCOUNTER — Ambulatory Visit (HOSPITAL_COMMUNITY): Payer: Medicare HMO | Attending: Physician Assistant

## 2023-06-23 ENCOUNTER — Encounter: Payer: Self-pay | Admitting: Physician Assistant

## 2023-06-23 DIAGNOSIS — I10 Essential (primary) hypertension: Secondary | ICD-10-CM | POA: Insufficient documentation

## 2023-06-23 DIAGNOSIS — E785 Hyperlipidemia, unspecified: Secondary | ICD-10-CM | POA: Insufficient documentation

## 2023-06-23 DIAGNOSIS — I25119 Atherosclerotic heart disease of native coronary artery with unspecified angina pectoris: Secondary | ICD-10-CM | POA: Insufficient documentation

## 2023-06-23 DIAGNOSIS — Z720 Tobacco use: Secondary | ICD-10-CM | POA: Diagnosis not present

## 2023-06-23 LAB — MYOCARDIAL PERFUSION IMAGING
Estimated workload: 1
Exercise duration (min): 1 min
Exercise duration (sec): 0 s
LV dias vol: 55 mL (ref 62–150)
LV sys vol: 19 mL
MPHR: 150 {beats}/min
Nuc Stress EF: 65 %
Peak HR: 106 {beats}/min
Percent HR: 70 %
Rest HR: 72 {beats}/min
Rest Nuclear Isotope Dose: 10.1 mCi
SDS: 1
SRS: 0
SSS: 1
Stress Nuclear Isotope Dose: 30.6 mCi
TID: 0.93

## 2023-06-23 MED ORDER — TECHNETIUM TC 99M TETROFOSMIN IV KIT
30.6000 | PACK | Freq: Once | INTRAVENOUS | Status: AC | PRN
  Administered 2023-06-23: 30.6 via INTRAVENOUS

## 2023-06-23 MED ORDER — TECHNETIUM TC 99M TETROFOSMIN IV KIT
10.1000 | PACK | Freq: Once | INTRAVENOUS | Status: AC | PRN
  Administered 2023-06-23: 10.1 via INTRAVENOUS

## 2023-06-23 MED ORDER — REGADENOSON 0.4 MG/5ML IV SOLN
0.4000 mg | Freq: Once | INTRAVENOUS | Status: AC
  Administered 2023-06-23: 0.4 mg via INTRAVENOUS

## 2023-07-05 NOTE — Progress Notes (Signed)
Pt has been made aware of normal result and verbalized understanding.  jw

## 2023-07-07 ENCOUNTER — Telehealth: Payer: Self-pay | Admitting: Family Medicine

## 2023-07-07 NOTE — Telephone Encounter (Signed)
Per [employer], " Unable to authorize at this time with information provided".   Provider would have to contact [employer] in order to get authorization approved.  The case # is [id number].  Patient has an appointment on 12/20 an dwould need the authorization before then.  Please get auth and update this.  Thanks, Gardiner Sleeper

## 2023-07-07 NOTE — Telephone Encounter (Signed)
I spoke with [employer]. Dr. [place] is adding to her note. Please re-send.

## 2023-07-09 ENCOUNTER — Ambulatory Visit (HOSPITAL_BASED_OUTPATIENT_CLINIC_OR_DEPARTMENT_OTHER): Admission: RE | Admit: 2023-07-09 | Payer: Medicare HMO | Source: Ambulatory Visit

## 2023-07-12 ENCOUNTER — Ambulatory Visit (HOSPITAL_COMMUNITY): Payer: Medicare HMO | Attending: Physician Assistant

## 2023-07-12 ENCOUNTER — Encounter (HOSPITAL_COMMUNITY): Payer: Self-pay | Admitting: Physician Assistant

## 2023-07-20 ENCOUNTER — Other Ambulatory Visit: Payer: Self-pay | Admitting: Family Medicine

## 2023-07-20 DIAGNOSIS — G8929 Other chronic pain: Secondary | ICD-10-CM

## 2023-09-18 ENCOUNTER — Emergency Department (HOSPITAL_COMMUNITY)

## 2023-09-18 ENCOUNTER — Observation Stay (HOSPITAL_COMMUNITY)
Admission: EM | Admit: 2023-09-18 | Discharge: 2023-09-20 | Disposition: A | Discharge status: Home / Self Care | Attending: Internal Medicine | Admitting: Internal Medicine

## 2023-09-18 ENCOUNTER — Encounter (HOSPITAL_COMMUNITY): Payer: Self-pay

## 2023-09-18 ENCOUNTER — Inpatient Hospital Stay (HOSPITAL_COMMUNITY)

## 2023-09-18 ENCOUNTER — Other Ambulatory Visit: Payer: Self-pay

## 2023-09-18 DIAGNOSIS — E785 Hyperlipidemia, unspecified: Secondary | ICD-10-CM | POA: Diagnosis present

## 2023-09-18 DIAGNOSIS — F1010 Alcohol abuse, uncomplicated: Secondary | ICD-10-CM | POA: Diagnosis not present

## 2023-09-18 DIAGNOSIS — K838 Other specified diseases of biliary tract: Secondary | ICD-10-CM | POA: Diagnosis not present

## 2023-09-18 DIAGNOSIS — Z951 Presence of aortocoronary bypass graft: Secondary | ICD-10-CM | POA: Insufficient documentation

## 2023-09-18 DIAGNOSIS — K219 Gastro-esophageal reflux disease without esophagitis: Secondary | ICD-10-CM | POA: Diagnosis not present

## 2023-09-18 DIAGNOSIS — I25119 Atherosclerotic heart disease of native coronary artery with unspecified angina pectoris: Secondary | ICD-10-CM | POA: Diagnosis present

## 2023-09-18 DIAGNOSIS — Z7902 Long term (current) use of antithrombotics/antiplatelets: Secondary | ICD-10-CM | POA: Diagnosis not present

## 2023-09-18 DIAGNOSIS — Z720 Tobacco use: Secondary | ICD-10-CM | POA: Diagnosis not present

## 2023-09-18 DIAGNOSIS — R10819 Abdominal tenderness, unspecified site: Secondary | ICD-10-CM | POA: Diagnosis not present

## 2023-09-18 DIAGNOSIS — K810 Acute cholecystitis: Principal | ICD-10-CM | POA: Diagnosis present

## 2023-09-18 DIAGNOSIS — K449 Diaphragmatic hernia without obstruction or gangrene: Secondary | ICD-10-CM | POA: Diagnosis not present

## 2023-09-18 DIAGNOSIS — I2510 Atherosclerotic heart disease of native coronary artery without angina pectoris: Secondary | ICD-10-CM | POA: Diagnosis not present

## 2023-09-18 DIAGNOSIS — K811 Chronic cholecystitis: Secondary | ICD-10-CM | POA: Diagnosis not present

## 2023-09-18 DIAGNOSIS — F17210 Nicotine dependence, cigarettes, uncomplicated: Secondary | ICD-10-CM | POA: Insufficient documentation

## 2023-09-18 DIAGNOSIS — I10 Essential (primary) hypertension: Secondary | ICD-10-CM | POA: Diagnosis present

## 2023-09-18 DIAGNOSIS — J441 Chronic obstructive pulmonary disease with (acute) exacerbation: Secondary | ICD-10-CM | POA: Diagnosis not present

## 2023-09-18 DIAGNOSIS — R001 Bradycardia, unspecified: Secondary | ICD-10-CM | POA: Diagnosis not present

## 2023-09-18 DIAGNOSIS — Z7982 Long term (current) use of aspirin: Secondary | ICD-10-CM | POA: Diagnosis not present

## 2023-09-18 DIAGNOSIS — R1111 Vomiting without nausea: Secondary | ICD-10-CM | POA: Diagnosis not present

## 2023-09-18 DIAGNOSIS — R1011 Right upper quadrant pain: Principal | ICD-10-CM | POA: Diagnosis present

## 2023-09-18 DIAGNOSIS — K828 Other specified diseases of gallbladder: Secondary | ICD-10-CM | POA: Diagnosis not present

## 2023-09-18 DIAGNOSIS — Z79899 Other long term (current) drug therapy: Secondary | ICD-10-CM | POA: Diagnosis not present

## 2023-09-18 DIAGNOSIS — R932 Abnormal findings on diagnostic imaging of liver and biliary tract: Secondary | ICD-10-CM | POA: Diagnosis not present

## 2023-09-18 DIAGNOSIS — Z1152 Encounter for screening for COVID-19: Secondary | ICD-10-CM | POA: Diagnosis not present

## 2023-09-18 LAB — COMPREHENSIVE METABOLIC PANEL
ALT: 26 U/L (ref 0–44)
AST: 27 U/L (ref 15–41)
Albumin: 3.7 g/dL (ref 3.5–5.0)
Alkaline Phosphatase: 106 U/L (ref 38–126)
Anion gap: 8 (ref 5–15)
BUN: 12 mg/dL (ref 8–23)
CO2: 26 mmol/L (ref 22–32)
Calcium: 9.5 mg/dL (ref 8.9–10.3)
Chloride: 100 mmol/L (ref 98–111)
Creatinine, Ser: 0.76 mg/dL (ref 0.61–1.24)
GFR, Estimated: >60 mL/min (ref >60)
Glucose, Bld: 136 mg/dL — ABNORMAL HIGH (ref 70–99)
Potassium: 4.1 mmol/L (ref 3.5–5.1)
Sodium: 134 mmol/L — ABNORMAL LOW (ref 135–145)
Total Bilirubin: 1 mg/dL (ref 0.0–1.2)
Total Protein: 7.3 g/dL (ref 6.5–8.1)

## 2023-09-18 LAB — URINALYSIS, ROUTINE W REFLEX MICROSCOPIC
Bilirubin Urine: NEGATIVE
Glucose, UA: NEGATIVE mg/dL
Hgb urine dipstick: NEGATIVE
Ketones, ur: NEGATIVE mg/dL
Leukocytes,Ua: NEGATIVE
Nitrite: NEGATIVE
Protein, ur: NEGATIVE mg/dL
Specific Gravity, Urine: 1.014 (ref 1.005–1.030)
pH: 6 (ref 5.0–8.0)

## 2023-09-18 LAB — CBC
HCT: 42 % (ref 39.0–52.0)
Hemoglobin: 14.6 g/dL (ref 13.0–17.0)
MCH: 32.1 pg (ref 26.0–34.0)
MCHC: 34.8 g/dL (ref 30.0–36.0)
MCV: 92.3 fL (ref 80.0–100.0)
Platelets: 204 10*3/uL (ref 150–400)
RBC: 4.55 MIL/uL (ref 4.22–5.81)
RDW: 13.2 % (ref 11.5–15.5)
WBC: 8.8 10*3/uL (ref 4.0–10.5)
nRBC: 0 % (ref 0.0–0.2)

## 2023-09-18 LAB — LIPASE, BLOOD: Lipase: 24 U/L (ref 11–51)

## 2023-09-18 LAB — RESP PANEL BY RT-PCR (RSV, FLU A&B, COVID)  RVPGX2
Influenza A by PCR: NEGATIVE
Influenza B by PCR: NEGATIVE
Resp Syncytial Virus by PCR: NEGATIVE
SARS Coronavirus 2 by RT PCR: NEGATIVE

## 2023-09-18 LAB — TROPONIN I (HIGH SENSITIVITY)
Troponin I (High Sensitivity): 6 ng/L (ref <18)
Troponin I (High Sensitivity): 6 ng/L (ref <18)

## 2023-09-18 MED ORDER — FOLIC ACID 1 MG PO TABS
1.0000 mg | ORAL_TABLET | Freq: Every day | ORAL | Status: DC
  Administered 2023-09-18 – 2023-09-20 (×3): 1 mg via ORAL
  Filled 2023-09-18 (×2): qty 1

## 2023-09-18 MED ORDER — OXYCODONE HCL 5 MG PO TABS
5.0000 mg | ORAL_TABLET | Freq: Once | ORAL | Status: AC
  Administered 2023-09-18: 5 mg via ORAL
  Filled 2023-09-18: qty 1

## 2023-09-18 MED ORDER — ACETAMINOPHEN 650 MG RE SUPP: 650.0000 mg | Freq: Four times a day (QID) | RECTAL | Status: DC | PRN

## 2023-09-18 MED ORDER — ATORVASTATIN CALCIUM 80 MG PO TABS
80.0000 mg | ORAL_TABLET | Freq: Every morning | ORAL | Status: DC
  Administered 2023-09-19 – 2023-09-20 (×2): 80 mg via ORAL
  Filled 2023-09-18 (×2): qty 1

## 2023-09-18 MED ORDER — ALBUTEROL SULFATE (2.5 MG/3ML) 0.083% IN NEBU: 2.5000 mg | INHALATION_SOLUTION | RESPIRATORY_TRACT | Status: DC | PRN

## 2023-09-18 MED ORDER — THIAMINE HCL 100 MG/ML IJ SOLN: 100.0000 mg | Freq: Every day | INTRAMUSCULAR | Status: DC

## 2023-09-18 MED ORDER — DULOXETINE HCL 60 MG PO CPEP
60.0000 mg | ORAL_CAPSULE | Freq: Every day | ORAL | Status: DC
  Administered 2023-09-18 – 2023-09-20 (×3): 60 mg via ORAL
  Filled 2023-09-18 (×3): qty 1

## 2023-09-18 MED ORDER — EZETIMIBE 10 MG PO TABS
10.0000 mg | ORAL_TABLET | Freq: Every day | ORAL | Status: DC
  Administered 2023-09-18 – 2023-09-20 (×3): 10 mg via ORAL
  Filled 2023-09-18 (×3): qty 1

## 2023-09-18 MED ORDER — TECHNETIUM TC 99M MEBROFENIN IV KIT
5.0000 | PACK | Freq: Once | INTRAVENOUS | Status: AC | PRN
  Administered 2023-09-18: 5 via INTRAVENOUS

## 2023-09-18 MED ORDER — ALBUTEROL SULFATE (2.5 MG/3ML) 0.083% IN NEBU: 2.5000 mg | INHALATION_SOLUTION | Freq: Four times a day (QID) | RESPIRATORY_TRACT | Status: DC | PRN

## 2023-09-18 MED ORDER — METHYLPREDNISOLONE SODIUM SUCC 125 MG IJ SOLR
125.0000 mg | Freq: Once | INTRAMUSCULAR | Status: AC
  Administered 2023-09-18: 125 mg via INTRAVENOUS
  Filled 2023-09-18: qty 2

## 2023-09-18 MED ORDER — IPRATROPIUM-ALBUTEROL 0.5-2.5 (3) MG/3ML IN SOLN
3.0000 mL | Freq: Four times a day (QID) | RESPIRATORY_TRACT | Status: DC
  Administered 2023-09-18 – 2023-09-20 (×7): 3 mL via RESPIRATORY_TRACT
  Filled 2023-09-18 (×7): qty 3

## 2023-09-18 MED ORDER — SODIUM CHLORIDE 0.9% FLUSH
3.0000 mL | Freq: Two times a day (BID) | INTRAVENOUS | Status: DC
Start: 2023-09-18 — End: 2023-09-20
  Administered 2023-09-18 – 2023-09-20 (×4): 3 mL via INTRAVENOUS

## 2023-09-18 MED ORDER — LORAZEPAM 2 MG/ML IJ SOLN
0.0000 mg | Freq: Four times a day (QID) | INTRAMUSCULAR | Status: AC
  Administered 2023-09-18 (×2): 2 mg via INTRAVENOUS
  Filled 2023-09-18 (×2): qty 1

## 2023-09-18 MED ORDER — ACETAMINOPHEN 325 MG PO TABS
650.0000 mg | ORAL_TABLET | Freq: Four times a day (QID) | ORAL | Status: DC | PRN
  Administered 2023-09-20: 650 mg via ORAL
  Filled 2023-09-18: qty 2

## 2023-09-18 MED ORDER — ONDANSETRON HCL 4 MG PO TABS: 4.0000 mg | ORAL_TABLET | Freq: Four times a day (QID) | ORAL | Status: DC | PRN

## 2023-09-18 MED ORDER — LORAZEPAM 1 MG PO TABS: 1.0000 mg | ORAL_TABLET | ORAL | Status: DC | PRN

## 2023-09-18 MED ORDER — ARFORMOTEROL TARTRATE 15 MCG/2ML IN NEBU
15.0000 ug | INHALATION_SOLUTION | Freq: Two times a day (BID) | RESPIRATORY_TRACT | Status: DC
  Administered 2023-09-18 – 2023-09-20 (×5): 15 ug via RESPIRATORY_TRACT
  Filled 2023-09-18 (×5): qty 2

## 2023-09-18 MED ORDER — THIAMINE MONONITRATE 100 MG PO TABS
100.0000 mg | ORAL_TABLET | Freq: Every day | ORAL | Status: DC
  Administered 2023-09-18 – 2023-09-20 (×3): 100 mg via ORAL
  Filled 2023-09-18 (×3): qty 1

## 2023-09-18 MED ORDER — SODIUM CHLORIDE 0.9 % IV SOLN
2.0000 g | INTRAVENOUS | Status: DC
  Administered 2023-09-19 – 2023-09-20 (×2): 2 g via INTRAVENOUS
  Filled 2023-09-18 (×2): qty 20

## 2023-09-18 MED ORDER — SODIUM CHLORIDE 0.9 % IV SOLN
2.0000 g | Freq: Once | INTRAVENOUS | Status: AC
  Administered 2023-09-18: 2 g via INTRAVENOUS
  Filled 2023-09-18: qty 20

## 2023-09-18 MED ORDER — LORAZEPAM 2 MG/ML IJ SOLN: 1.0000 mg | INTRAMUSCULAR | Status: DC | PRN

## 2023-09-18 MED ORDER — PANTOPRAZOLE SODIUM 40 MG PO TBEC
40.0000 mg | DELAYED_RELEASE_TABLET | Freq: Every day | ORAL | Status: DC
  Administered 2023-09-18 – 2023-09-20 (×3): 40 mg via ORAL
  Filled 2023-09-18 (×3): qty 1

## 2023-09-18 MED ORDER — ONDANSETRON HCL 4 MG/2ML IJ SOLN
4.0000 mg | Freq: Once | INTRAMUSCULAR | Status: AC
  Administered 2023-09-18: 4 mg via INTRAVENOUS
  Filled 2023-09-18: qty 2

## 2023-09-18 MED ORDER — LORAZEPAM 2 MG/ML IJ SOLN: 0.0000 mg | Freq: Two times a day (BID) | INTRAMUSCULAR | Status: DC

## 2023-09-18 MED ORDER — GABAPENTIN 300 MG PO CAPS
300.0000 mg | ORAL_CAPSULE | Freq: Every day | ORAL | Status: DC
  Administered 2023-09-18 – 2023-09-19 (×2): 300 mg via ORAL
  Filled 2023-09-18 (×2): qty 1

## 2023-09-18 MED ORDER — BUDESONIDE 0.5 MG/2ML IN SUSP
0.5000 mg | Freq: Two times a day (BID) | RESPIRATORY_TRACT | Status: DC
  Administered 2023-09-18 – 2023-09-20 (×5): 0.5 mg via RESPIRATORY_TRACT
  Filled 2023-09-18 (×5): qty 2

## 2023-09-18 MED ORDER — PREDNISONE 20 MG PO TABS
40.0000 mg | ORAL_TABLET | Freq: Every day | ORAL | Status: DC
  Administered 2023-09-19 – 2023-09-20 (×2): 40 mg via ORAL
  Filled 2023-09-18 (×2): qty 2

## 2023-09-18 MED ORDER — FENTANYL CITRATE PF 50 MCG/ML IJ SOSY
25.0000 ug | PREFILLED_SYRINGE | INTRAMUSCULAR | Status: DC | PRN
  Administered 2023-09-20 (×2): 25 ug via INTRAVENOUS
  Filled 2023-09-18 (×2): qty 1

## 2023-09-18 MED ORDER — ENOXAPARIN SODIUM 40 MG/0.4ML IJ SOSY
40.0000 mg | PREFILLED_SYRINGE | INTRAMUSCULAR | Status: DC
  Administered 2023-09-18 – 2023-09-20 (×2): 40 mg via SUBCUTANEOUS
  Filled 2023-09-18 (×2): qty 0.4

## 2023-09-18 MED ORDER — ADULT MULTIVITAMIN W/MINERALS CH
1.0000 | ORAL_TABLET | Freq: Every day | ORAL | Status: DC
  Administered 2023-09-18 – 2023-09-20 (×3): 1 via ORAL
  Filled 2023-09-18 (×3): qty 1

## 2023-09-18 MED ORDER — SODIUM CHLORIDE 0.9 % IV SOLN: INTRAVENOUS | Status: AC

## 2023-09-18 MED ORDER — ONDANSETRON HCL 4 MG/2ML IJ SOLN: 4.0000 mg | Freq: Four times a day (QID) | INTRAMUSCULAR | Status: DC | PRN

## 2023-09-18 MED ORDER — MORPHINE SULFATE (PF) 4 MG/ML IV SOLN
3.0000 mg | Freq: Once | INTRAVENOUS | Status: AC
  Administered 2023-09-18: 3 mg via INTRAVENOUS
  Filled 2023-09-18: qty 1

## 2023-09-18 MED ORDER — METOPROLOL SUCCINATE ER 25 MG PO TB24
50.0000 mg | ORAL_TABLET | Freq: Every day | ORAL | Status: DC
  Administered 2023-09-18 – 2023-09-20 (×3): 50 mg via ORAL
  Filled 2023-09-18 (×3): qty 2

## 2023-09-18 MED ORDER — FENTANYL CITRATE PF 50 MCG/ML IJ SOSY
25.0000 ug | PREFILLED_SYRINGE | Freq: Once | INTRAMUSCULAR | Status: AC
  Administered 2023-09-18: 25 ug via INTRAVENOUS
  Filled 2023-09-18: qty 1

## 2023-09-18 NOTE — ED Provider Notes (Signed)
 [place] EMERGENCY DEPARTMENT AT [facility] Provider Note   CSN: [id number] Arrival date & time: 09/18/23  0225     History  Chief Complaint  Patient presents with   Abdominal Pain    Derrick Stokes is a 73 y.o. male.  73 year old male presents today for concern of right upper quadrant abdominal pain.  Symptoms started around 4:30 PM on Friday.  Endorses nausea, vomiting.  States he initially had some chest pain which has since resolved.  States it is not consistent with his anginal pain.  Currently endorses right upper quadrant abdominal pain.  No previous abdominal surgeries.  Last p.o. intake was around 4 PM yesterday.  The history is provided by the patient. No language interpreter was used.       Home Medications Prior to Admission medications   Medication Sig Start Date End Date Taking? Authorizing Provider  albuterol (VENTOLIN HFA) 108 (90 Base) MCG/ACT inhaler INHALE 1-2 PUFFS BY MOUTH INTO THE LUNGS EVERY 6 HOURS AS NEEDED FOR WHEEZING AND/OR FOR SHORTNESS OF BREATH 03/23/23   [place], [name], MD  aspirin EC 81 MG tablet Take 81 mg by mouth at bedtime.     [provider]  atorvastatin (LIPITOR) 80 MG tablet TAKE 1 TABLET (80 MG TOTAL) BY MOUTH EVERY MORNING. 06/22/23   Tonny Bollman, MD  clopidogrel (PLAVIX) 75 MG tablet TAKE 1 TABLET BY MOUTH EVERY MORNING 06/22/23   Tonny Bollman, MD  DULoxetine (CYMBALTA) 60 MG capsule TAKE 1 CAPSULE BY MOUTH ONCE DAILY 07/26/23   [place], [name], MD  ezetimibe (ZETIA) 10 MG tablet TAKE 1 TABLET BY MOUTH DAILY 06/22/23   Tonny Bollman, MD  gabapentin (NEURONTIN) 300 MG capsule TAKE 1 CAPSULE BY MOUTH AT BEDTIME 04/27/23   [place], [name], MD  metoprolol succinate (TOPROL-XL) 50 MG 24 hr tablet TAKE 1 TABLET BY MOUTH ONCE DAILY WITH A MEAL 06/22/23   Tonny Bollman, MD  nitroGLYCERIN (NITROSTAT) 0.4 MG SL tablet Place 1 tablet (0.4 mg total) under the tongue every 5 (five) minutes as needed for chest pain. PLACE 1  TABLET UNDER THE TONGUE EVERY 5 MINUTES X 3 DOSES AS NEEDED FOR CHEST PAIN. 12/04/21   [place]inyer, Zachary George, NP  pantoprazole (PROTONIX) 40 MG tablet TAKE 1 TABLET BY MOUTH DAILY 06/22/23   Tonny Bollman, MD  umeclidinium-vilanterol [facility] ELLIPTA) 62.5-25 MCG/ACT AEPB Inhale 1 puff into the lungs daily at 6 (six) AM. 04/08/23   [place], [name], MD  vitamin B-12 (CYANOCOBALAMIN) 1000 MCG tablet Take 1,000 mcg by mouth daily.     [provider]      Allergies    Iodine, Iohexol, Shrimp [shellfish allergy], and Isosorbide dinitrate    Review of Systems   Review of Systems  Constitutional:  Negative for chills and fever.  Gastrointestinal:  Positive for abdominal pain and nausea. Negative for vomiting.  Neurological:  Negative for light-headedness.  All other systems reviewed and are negative.   Physical Exam Updated Vital Signs BP 135/79   Pulse 62   Temp 98 F (36.7 C)   Resp 20   SpO2 97%  Physical Exam Vitals and nursing note reviewed.  Constitutional:      General: He is not in acute distress.    Appearance: Normal appearance. He is not ill-appearing.  HENT:     Head: Normocephalic and atraumatic.     Nose: Nose normal.  Eyes:     Conjunctiva/sclera: Conjunctivae normal.  Cardiovascular:  Rate and Rhythm: Normal rate and regular rhythm.  Pulmonary:     Effort: Pulmonary effort is normal. No respiratory distress.  Abdominal:     General: There is no distension.     Palpations: Abdomen is soft.     Tenderness: There is abdominal tenderness. There is guarding.  Musculoskeletal:        General: No deformity.  Skin:    Findings: No rash.  Neurological:     Mental Status: He is alert.     ED Results / Procedures / Treatments   Labs (all labs ordered are listed, but only abnormal results are displayed) Labs Reviewed  URINALYSIS, ROUTINE W REFLEX MICROSCOPIC - Abnormal; Notable for the following components:      Result Value   Color, Urine AMBER (*)     All other components within normal limits  COMPREHENSIVE METABOLIC PANEL - Abnormal; Notable for the following components:   Sodium 134 (*)    Glucose, Bld 136 (*)    All other components within normal limits  CBC  LIPASE, BLOOD  TROPONIN I (HIGH SENSITIVITY)    EKG None  Radiology US Abdomen Limited RUQ (LIVER/GB) Result Date: 09/18/2023 CLINICAL DATA:  Right upper quadrant pain EXAM: ULTRASOUND ABDOMEN LIMITED RIGHT UPPER QUADRANT COMPARISON:  02/19/2020 FINDINGS: Gallbladder: Full gallbladder with internal sludge. No shadowing stone detected separate from the sludge. There is wall thickening to 4 mm and focal tenderness. Common bile duct: Diameter: 5 mm Liver: Echogenic liver diffusely, usually indicating hepatocellular disease such as steatosis. Portal vein is patent on color Doppler imaging with normal direction of blood flow towards the liver. IMPRESSION: Gallbladder sludge with distended and focally tender gallbladder that could be obstructed. Possible cholecystitis. Electronically Signed   By: Tiburcio Pea M.D.   On: 09/18/2023 06:22    Procedures .Critical Care  Performed by: Marita Kansas, PA-C Authorized by: Marita Kansas, PA-C   Critical care provider statement:    Critical care time (minutes):  35   Critical care was necessary to treat or prevent imminent or life-threatening deterioration of the following conditions: Acute cholecystitis, general surgery consult, antibiotics.   Critical care was time spent personally by me on the following activities:  Development of treatment plan with patient or surrogate, discussions with consultants, evaluation of patient's response to treatment, examination of patient, ordering and review of laboratory studies, ordering and review of radiographic studies, ordering and performing treatments and interventions, pulse oximetry, re-evaluation of patient's condition and review of old charts   Care discussed with: admitting provider        Medications Ordered in ED Medications  fentaNYL (SUBLIMAZE) injection 25 mcg (has no administration in time range)  ondansetron (ZOFRAN) injection 4 mg (has no administration in time range)  cefTRIAXone (ROCEPHIN) 2 g in sodium chloride 0.9 % 100 mL IVPB (has no administration in time range)  oxyCODONE (Oxy IR/ROXICODONE) immediate release tablet 5 mg (5 mg Oral Given 09/18/23 0301)    ED Course/ Medical Decision Making/ A&P                                 Medical Decision Making Amount and/or Complexity of Data Reviewed Labs: ordered.  Risk Prescription drug management.   Medical Decision Making / ED Course   This patient presents to the ED for concern of abdominal pain, right upper quadrant, this involves an extensive number of treatment options, and is a complaint that carries  with it a high risk of complications and morbidity.  The differential diagnosis includes cholecystitis, pancreatitis, diverticulitis, colitis, appendicitis  MDM: 73 year old male presents today for concern of right upper quadrant abdominal pain.  This started yesterday afternoon around 4:30 PM.  He had some nausea and 1 episode of vomiting.  He states initially he had some chest pressure but that is since resolved.  Admission considered but will reevaluate after labs and imaging.  On exam he does have right upper quadrant tenderness palpation.  Right upper ultrasound was obtained which shows concern for acute cholecystitis.  CBC without leukocytosis or anemia.  CMP without acute concerns.  UA without evidence of UTI.  Lipase within normal.  Troponin negative.  Low suspicion for ACS. Discussed with general surgery.  Spoke with Lonia Farber.  They recommend antibiotics, admit to medicine.  Discussed with hospitalist will evaluate patient for admission.  Lab Tests: -I ordered, reviewed, and interpreted labs.   The pertinent results include:   Labs Reviewed  URINALYSIS, ROUTINE W REFLEX MICROSCOPIC -  Abnormal; Notable for the following components:      Result Value   Color, Urine AMBER (*)    All other components within normal limits  COMPREHENSIVE METABOLIC PANEL - Abnormal; Notable for the following components:   Sodium 134 (*)    Glucose, Bld 136 (*)    All other components within normal limits  CBC  LIPASE, BLOOD  TROPONIN I (HIGH SENSITIVITY)  TROPONIN I (HIGH SENSITIVITY)      EKG  EKG Interpretation Date/Time:    Ventricular Rate:    PR Interval:    QRS Duration:    QT Interval:    QTC Calculation:   R Axis:      Text Interpretation:           Imaging Studies ordered: I ordered imaging studies including right upper quadrant ultrasound I independently visualized and interpreted imaging. I agree with the radiologist interpretation   Medicines ordered and prescription drug management: Meds ordered this encounter  Medications   oxyCODONE (Oxy IR/ROXICODONE) immediate release tablet 5 mg    Refill:  0   fentaNYL (SUBLIMAZE) injection 25 mcg   ondansetron (ZOFRAN) injection 4 mg   cefTRIAXone (ROCEPHIN) 2 g in sodium chloride 0.9 % 100 mL IVPB    Antibiotic Indication::   Intra-abdominal    -I have reviewed the patients home medicines and have made adjustments as needed  Critical interventions Antibiotics, general surgery consult, acute cholecystitis  Consultations Obtained: I requested consultation with the general surgery,  and discussed lab and imaging findings as well as pertinent plan - they recommend: As above  Reevaluation: After the interventions noted above, I reevaluated the patient and found that they have :stayed the same  Co morbidities that complicate the patient evaluation  Past Medical History:  Diagnosis Date   Arthritis    B12 deficiency    Borderline   CAD (coronary artery disease)    a. s/p CABG 01/2011;  b. ETT Myoview 3/14:  Low risk;  c. 09/2014 Cath/PCI: LM nl, LAD 100p, LCX 66m/OM2 60-70p (2.75x28 Synergy DES), RCA dom,  13m (atherectomy, 3.5x24 Synergy DES), PDA nl, LIMA->LAD nl, VG->Diag nl, VG->OM 100, VG->RCA 100.// Myoview 06/23/2023: No ischemia or infarction, EF 65, low risk   Cataract    bilateral sx    COPD (chronic obstructive pulmonary disease) (HCC)    GERD (gastroesophageal reflux disease)    on meds   Hiatal hernia    History of echocardiogram  Echo 10/16: EF 50-55%, no RWMA, Gr 1 DD, mild MR, mild TR   History of kidney stones    Passed   HLD (hyperlipidemia)    on meds   HOH (hard of hearing)    Hypertension    on meds   Leg pain    ABIs 4/14:  R 1.2, L 1.2, TBIs normal   Peripheral neuropathy    From trauma   Reactive airway disease 03/18/2018   Tobacco abuse       Dispostion: Discussed with the admitting call evaluate patient for admission.   Final Clinical Impression(s) / ED Diagnoses Final diagnoses:  None    Rx / DC Orders ED Discharge Orders     None         Marita Kansas, PA-C 09/18/23 1154    Cathren Laine, MD 09/19/23 [phone]

## 2023-09-18 NOTE — ED Provider Triage Note (Signed)
 Emergency Medicine Provider Triage Evaluation Note  JOMO FORAND , a 73 y.o. male  was evaluated in triage.  Pt complains of RUQ pain that began yesterday.  Some nausea today, has not really wanted to eat.  Still has gallbladder.  Review of Systems  Positive: RUQ pain Negative: fever  Physical Exam  BP (!) 158/87 (BP Location: Right Arm)   Pulse (!) 56   Temp 97.9 F (36.6 C)   Resp 20   SpO2 99%  Gen:   Awake, no distress   Resp:  Normal effort  MSK:   Moves extremities without difficulty  Other:  Tender RUQ  Medical Decision Making  Medically screening exam initiated at 2:57 AM.  Appropriate orders placed.  Jakylan Ron Hauschild was informed that the remainder of the evaluation will be completed by another provider, this initial triage assessment does not replace that evaluation, and the importance of remaining in the ED until their evaluation is complete.  RUQ pain.  Labs and RUQ [place] ordered.  Oxycodone given for pain.   Garlon Hatchet, PA-C 09/18/23 [phone]

## 2023-09-18 NOTE — Consult Note (Signed)
 Consult Note  Derrick Stokes 1951-04-25  [id number].    Requesting MD: Dr. Denton Lank Chief Complaint/Reason for Consult: RUQ pain  HPI:  73 y.o. male with medical history significant for chronic back pain, arthritis, CAD status post CABG 2012, COPD, GERD, HLD, hypertension, peripheral neuropathy, tobacco abuse, alcohol use who presented to [facility] ED with nausea, vomiting, right upper quadrant abdominal pain.  He started feeling sick on Thursday with upset stomach and only ate breakfast.  He thought acute worsening of his chronic back pain could be contributing.  He continued to feel poorly on Friday and developed nausea with vomiting as well as persistent right upper quadrant abdominal pain.  He did not eat anything on Friday secondary to his symptoms but did try some Pepsi thinking that if he could belch the pain would improve.  He did take his daily medications including his Plavix in the morning.  He normally drinks about a sixpack of beer per day and did drink 2 beers on Friday.  Additionally, he has felt like his chronic cough in the setting of COPD has worsened over the last week  Workup in the ED significant for ultrasound showing distended gallbladder wall edema and sludge with positive sonographic Murphy sign.  Of note he also has echogenic liver usually indicative of hepatocellular disease such as steatosis.  Substance use: Daily tobacco smoker, daily alcohol use-6 pack of beer per day.  Has gone through withdrawal in the past Allergies: Shellfish, iodine, iohexol Blood thinners: Plavix last dose 2/20 8 AM Past Surgeries: No prior abdominal surgeries   ROS: Reviewed and as above  Family History  Problem Relation Age of Onset   Diabetes Father    Heart attack Father 13   Heart disease Father        CAD   Cancer Mother    Diabetes Brother    Diabetes Brother    Colon cancer Neg Hx    Esophageal cancer Neg Hx    Stomach cancer Neg Hx    Pancreatic cancer Neg Hx     Inflammatory bowel disease Neg Hx    Liver disease Neg Hx    Rectal cancer Neg Hx    Colon polyps Neg Hx     Past Medical History:  Diagnosis Date   Arthritis    B12 deficiency    Borderline   CAD (coronary artery disease)    a. s/p CABG 01/2011;  b. ETT Myoview 3/14:  Low risk;  c. 09/2014 Cath/PCI: LM nl, LAD 100p, LCX 98m/OM2 60-70p (2.75x28 Synergy DES), RCA dom, 17m (atherectomy, 3.5x24 Synergy DES), PDA nl, LIMA->LAD nl, VG->Diag nl, VG->OM 100, VG->RCA 100.// Myoview 06/23/2023: No ischemia or infarction, EF 65, low risk   Cataract    bilateral sx    COPD (chronic obstructive pulmonary disease) (HCC)    GERD (gastroesophageal reflux disease)    on meds   Hiatal hernia    History of echocardiogram    Echo 10/16: EF 50-55%, no RWMA, Gr 1 DD, mild MR, mild TR   History of kidney stones    Passed   HLD (hyperlipidemia)    on meds   HOH (hard of hearing)    Hypertension    on meds   Leg pain    ABIs 4/14:  R 1.2, L 1.2, TBIs normal   Peripheral neuropathy    From trauma   Reactive airway disease 03/18/2018   Tobacco abuse     Past Surgical History:  Procedure Laterality Date   COLONOSCOPY  03/09/2020   Neg   COLONOSCOPY WITH PROPOFOL N/A 05/08/2020   Procedure: COLONOSCOPY WITH PROPOFOL;  Surgeon: Meridee Score Netty Starring., MD;  Location: [facility] ENDOSCOPY;  Service: Gastroenterology;  Laterality: N/A;   CORONARY ARTERY BYPASS GRAFT  2013   x5   CORONARY PRESSURE/FFR STUDY N/A 03/17/2018   Procedure: INTRAVASCULAR PRESSURE WIRE/FFR STUDY;  Surgeon: Runell Gess, MD;  [facility] INVASIVE CV LAB;  Service: Cardiovascular;  Laterality: N/A;   ENDOSCOPIC MUCOSAL RESECTION N/A 05/08/2020   Procedure: ENDOSCOPIC MUCOSAL RESECTION;  Surgeon: Meridee Score Netty Starring., MD;  Location: [facility] ENDOSCOPY;  Service: Gastroenterology;  Laterality: N/A;   EPIDURAL STEROIDS     EYE SURGERY Bilateral    Cataract   HEMOSTASIS CLIP PLACEMENT  05/08/2020   Procedure: HEMOSTASIS CLIP  PLACEMENT;  Surgeon: Lemar Lofty., MD;  Location: [facility] ENDOSCOPY;  Service: Gastroenterology;;   HEMOSTASIS CONTROL  05/08/2020   Procedure: HEMOSTASIS CONTROL;  Surgeon: Lemar Lofty., MD;  Location: [facility] ENDOSCOPY;  Service: Gastroenterology;;   LEFT HEART CATH AND CORONARY ANGIOGRAPHY N/A 03/17/2018   Procedure: LEFT HEART CATH AND CORONARY ANGIOGRAPHY;  Surgeon: Runell Gess, MD;  Location: [facility] INVASIVE CV LAB;  Service: Cardiovascular;  Laterality: N/A;   LEFT HEART CATH AND CORS/GRAFTS ANGIOGRAPHY N/A 10/16/2020   Procedure: LEFT HEART CATH AND CORS/GRAFTS ANGIOGRAPHY;  Surgeon: Tonny Bollman, MD;  Location: [facility] INVASIVE CV LAB;  Service: Cardiovascular;  Laterality: N/A;   LEFT HEART CATHETERIZATION WITH CORONARY/GRAFT ANGIOGRAM N/A 09/18/2014   Procedure: LEFT HEART CATHETERIZATION WITH Isabel Caprice;  Surgeon: Corky Crafts, MD;  [facility] C[facility] LAB;  Service: Cardiovascular;  Laterality: N/A;   PERCUTANEOUS CORONARY ROTOBLATOR INTERVENTION (PCI-R) N/A 09/20/2014   Procedure: PERCUTANEOUS CORONARY ROTOBLATOR INTERVENTION (PCI-R);  Surgeon: Corky Crafts, MD;  Location: [facility] CATH LAB;  Service: Cardiovascular;  Laterality: N/A;   POLYPECTOMY  02/28/2020   6 polyps/tics/hems   POLYPECTOMY  05/08/2020   Procedure: POLYPECTOMY;  Surgeon: Meridee Score Netty Starring., MD;  Location: [facility] ENDOSCOPY;  Service: Gastroenterology;;   Susa Day  05/08/2020   Procedure: Susa Day;  Surgeon: Mansouraty, Netty Starring., MD;  Location: [facility] ENDOSCOPY;  Service: Gastroenterology;;   SUBMUCOSAL LIFTING INJECTION  05/08/2020   Procedure: SUBMUCOSAL LIFTING INJECTION;  Surgeon: Lemar Lofty., MD;  Location: [facility] ENDOSCOPY;  Service: Gastroenterology;;    Social History:  reports that he has been smoking cigarettes. He started smoking about 55 years ago. He has a 55.2 pack-year smoking history. He has been exposed to tobacco smoke. He has never used smokeless  tobacco. He reports current alcohol use of about 3.0 standard drinks of alcohol per week. He reports that he does not use drugs.  Allergies:  Allergies  Allergen Reactions   Iodine Nausea And Vomiting    Turns white in face and sweaty   Iohexol Other (See Comments)    May have caused nausea and vomiting several years ago    Shrimp [Shellfish Allergy] Nausea And Vomiting    Turns white in face and sweaty   Isosorbide Dinitrate Other (See Comments)    headache    (Not in a hospital admission)   Blood pressure 122/68, pulse 63, temperature 97.9 F (36.6 C), resp. rate (!) 23, SpO2 100%. Physical Exam: General: pleasant, WD, male who is laying in bed in NAD HEENT: head is normocephalic, atraumatic.  Sclera are noninjected.  Pupils equal and round. EOMs intact.  Ears and nose without any masses or lesions.  Mouth is pink  and moist Lungs: Respiratory effort nonlabored on room air.  Productive cough during my exam Abd: soft, ND, no masses, hernias, or organomegaly.  Significant tenderness to palpation right upper quadrant with voluntary guarding MSK: all 4 extremities are symmetrical with no cyanosis, clubbing, or edema. Skin: warm and dry with no masses, lesions, or rashes Neuro: Cranial nerves 2-12 grossly intact, sensation is normal throughout Psych: A&Ox3 with an appropriate affect.    Results for orders placed or performed during the hospital encounter of 09/18/23 (from the past 48 hours)  Urinalysis, Routine w reflex microscopic -Urine, Clean Catch     Status: Abnormal   Collection Time: 09/18/23  2:30 AM  Result Value Ref Range   Color, Urine AMBER (A) YELLOW    Comment: BIOCHEMICALS MAY BE AFFECTED BY COLOR   APPearance CLEAR CLEAR   Specific Gravity, Urine 1.014 1.005 - 1.030   pH 6.0 5.0 - 8.0   Glucose, UA NEGATIVE NEGATIVE mg/dL   Hgb urine dipstick NEGATIVE NEGATIVE   Bilirubin Urine NEGATIVE NEGATIVE   Ketones, ur NEGATIVE NEGATIVE mg/dL   Protein, ur NEGATIVE  NEGATIVE mg/dL   Nitrite NEGATIVE NEGATIVE   Leukocytes,Ua NEGATIVE NEGATIVE    Comment: Performed at [facility], [address], [place], [place] [address]  CBC     Status: None   Collection Time: 09/18/23  2:37 AM  Result Value Ref Range   WBC 8.8 4.0 - 10.5 K/uL   RBC 4.55 4.22 - 5.81 MIL/uL   Hemoglobin 14.6 13.0 - 17.0 g/dL   HCT 09.8 11.9 - 14.7 %   MCV 92.3 80.0 - 100.0 fL   MCH 32.1 26.0 - 34.0 pg   MCHC 34.8 30.0 - 36.0 g/dL   RDW 82.9 56.2 - 13.0 %   Platelets 204 150 - 400 K/uL   nRBC 0.0 0.0 - 0.2 %    Comment: Performed at [facility], [address] [place], [place] [address]  Comprehensive metabolic panel     Status: Abnormal   Collection Time: 09/18/23  3:20 AM  Result Value Ref Range   Sodium 134 (L) 135 - 145 mmol/L   Potassium 4.1 3.5 - 5.1 mmol/L   Chloride 100 98 - 111 mmol/L   CO2 26 22 - 32 mmol/L   Glucose, Bld 136 (H) 70 - 99 mg/dL    Comment: Glucose reference range applies only to samples taken after fasting for at least 8 hours.   BUN 12 8 - 23 mg/dL   Creatinine, Ser 4.69 0.61 - 1.24 mg/dL   Calcium 9.5 8.9 - 62.9 mg/dL   Total Protein 7.3 6.5 - 8.1 g/dL   Albumin 3.7 3.5 - 5.0 g/dL   AST 27 15 - 41 U/L   ALT 26 0 - 44 U/L   Alkaline Phosphatase 106 38 - 126 U/L   Total Bilirubin 1.0 0.0 - 1.2 mg/dL   GFR, Estimated >52 >84 mL/min    Comment: (NOTE) Calculated using the CKD-EPI Creatinine Equation (2021)    Anion gap 8 5 - 15    Comment: Performed at [facility], [address], [place], [place] [address]  Lipase, blood     Status: None   Collection Time: 09/18/23  3:20 AM  Result Value Ref Range   Lipase 24 11 - 51 U/L    Comment: Performed at [facility], [address], [place], [place] [address]  Troponin I (High Sensitivity)  Status: None   Collection Time: 09/18/23  7:34 AM  Result Value Ref Range   Troponin I (High Sensitivity) 6 <18 ng/L    Comment: (NOTE) Elevated high sensitivity troponin I  (hsTnI) values and significant  changes across serial measurements may suggest ACS but many other  chronic and acute conditions are known to elevate hsTnI results.  Refer to the "Links" section for chest pain algorithms and additional  guidance. Performed at [facility], [address] [place], [place] [address]   Troponin I (High Sensitivity)     Status: None   Collection Time: 09/18/23  8:46 AM  Result Value Ref Range   Troponin I (High Sensitivity) 6 <18 ng/L    Comment: (NOTE) Elevated high sensitivity troponin I (hsTnI) values and significant  changes across serial measurements may suggest ACS but many other  chronic and acute conditions are known to elevate hsTnI results.  Refer to the "Links" section for chest pain algorithms and additional  guidance. Performed at [facility], [address], [place], [place] [address]    US Abdomen Limited RUQ (LIVER/GB) Result Date: 09/18/2023 CLINICAL DATA:  Right upper quadrant pain EXAM: ULTRASOUND ABDOMEN LIMITED RIGHT UPPER QUADRANT COMPARISON:  02/19/2020 FINDINGS: Gallbladder: Full gallbladder with internal sludge. No shadowing stone detected separate from the sludge. There is wall thickening to 4 mm and focal tenderness. Common bile duct: Diameter: 5 mm Liver: Echogenic liver diffusely, usually indicating hepatocellular disease such as steatosis. Portal vein is patent on color Doppler imaging with normal direction of blood flow towards the liver. IMPRESSION: Gallbladder sludge with distended and focally tender gallbladder that could be obstructed. Possible cholecystitis. Electronically Signed   By: Tiburcio Pea M.D.   On: 09/18/2023 06:22      Assessment/Plan Possible cholecystitis Ultrasound with gallbladder sludge, no stones  Patient seen and examined and relevant labs and imaging personally reviewed.  His history, exam, and ultrasound findings are concerning for cholecystitis.  No stones seen on ultrasound.   Findings of hepatic steatosis on ultrasound in setting of alcohol use.  He is afebrile with WBC normal and LFTs and T. bili WNL.  He is on Plavix with history of CABG in 2012 and DES to LCx and DES to RCA in 2016.  Recommend hospitalist admission for IV antibiotics and bowel rest while holding Plavix.  He can have clear liquids for now.  Recommend cardiac clearance for possible surgical intervention however given no stones on ultrasound we will monitor his symptoms and pending cards eval +/- HIDA he may be a candidate for antibiotics only versus PERC chole versus laparoscopic cholecystectomy.  We will follow  FEN: Clears, IVF per primary ID: Rocephin VTE: okay for chemical prophylaxis from surgical standpoint.  Please hold Plavix  - Per primary -  Alcohol abuse Tobacco use CAD-history of CABG 2012, stenting 2016, on Plavix at baseline Chronic back pain hypertension HLD COPD  I reviewed ED provider notes, last 24 h vitals and pain scores, last 48 h intake and output, last 24 h labs and trends, and last 24 h imaging results.   Eric Form, [facility] 09/18/2023, 11:18 AM Please see Amion for pager number during day hours 7:00am-4:30pm

## 2023-09-18 NOTE — Discharge Instructions (Signed)
 Outpatient Substance Abuse  Treatment- uninsured  Narcotics Anonymous 24-HOUR HELPLINE Pre-recorded for Meeting Schedules [facility] [phone]  [email or web address] ALCOHOLICS ANONYMOUS  [facility]  Answering Service [phone] Please Note: All [place] Meetings are Non-smoking [email or web address]  Alcohol and Drug Services -  Insurance: Medicaid /State funding/private insurance Methadone, suboxone/Intensive outpatient  [place]   [phone] Fax: [phone] [address], [place], [place], [address] [place] [phone] Fax: [phone]    [address] [place], [place], [address] ([address] [place], [place], [place], [place], [place], [place], [place], [place]) [facility] [email or web address] Accepts State funding/Medicaid Transitional housing, Intensive Outpatient Treatment, Outpatient treatment, Veterans Services  Phone: [phone] Fax: [phone] Address: [address] [place] [address]   [employer] [facility] ([email or web address]/) Insurance: Medicaid Case Management, Administrator, arts, Medication Management, Outpatient Therapy, Psychosocial Rehabilitation, Substance Abuse Intensive Outpatient  Phone: [phone] Fax: [phone] [address], [place], [place], [address]  [facility] Medicaid, most private insurance providers Types of Program: Individual/Group Therapy, Substance Abuse Treatment  Phone: [place] [phone] Fax: [phone] [address], [place], [place], [address] [place] [phone] [address] [place], [place], [address]  [facility]  Medicaid Types of Program: [place]OP  Phone: [phone] Fax: [phone] [address] [place], [place], [place], [address] [facility] Medicaid/state funds Crisis line [phone] [place] [facility] [phone] [place] [phone]  [place] [phone]  [facility] [address];  [place], [place] [address] [phone]  Substance Abuse Intensive Outpatient Program [facility] [address] [place], [place] [address] [phone]- Substance abuse treatment  Successful Transitions  Insurance: [facility], [address], sliding scale Types of Program: substance abuse treatment, transportation assistance Phone: [phone] Fax: [phone] Address: [address], [place] [address] [facility] ([email or web address]) Insurance: UHC, [place], [place], [place] Program: addiction counseling, detoxification,  Phone: [phone]  Fax: [phone] Address: [address], [place] [address]  [facility] (statewide facilities/programs) [address] (Medicaid/state funds) [place], [place] [address]                      [email or web address] 8708449685 Marcy Panning- [phone] [place]- [phone] [facility] [place] (2 Locations) (Medicaid/state funds) --[address]  walk in 8:30-12 and 1-2:30 [place], [id number]   [facility]- [phone] --[address] [place], [place] [address]  [phone] walk in 8:30-12 and 2-3:30  [facility] state funds/medicaid [address] [place], [place] [address] [phone] [facility] (Suboxone clinic) Medicaid/state funds  [address]  [place], [place] [address] [phone]   [facility]  [address], [place], [place] [address]  [phone] (24 hours) [place]- [address] [place], [place] [address]  [phone] (24 hours) [place]- [address] [place] [phone] [place]- [address] Rosalita Levan [phone] Turner Daniels [address] [place] [phone] [facility]- Medicaid and state funds  [place]- [address] [place], [place] [address] [phone] (24 hours) [place]- [address] [place],  [place] [address] [phone] [facility]- [address] [place], [place] [address] [phone] (24 hours) [place] [address]  [address] [place], [place]  [address] [phone] [place]- [address]  [phone]

## 2023-09-18 NOTE — ED Triage Notes (Signed)
 Pt is coming from home, he has right upper quadrant pain that could've been going on for 1 night or 2, no complaints of breathing diffculty, he was smoking upon medic arrival. He only complaints of the RUQ pain.   Medic vitals  50hr 30rr Etco2 20 99%ra 100bgl 155/83  20g left hand

## 2023-09-18 NOTE — H&P (Addendum)
 History and Physical    Patient: Derrick Stokes NWG:[id number] DOB: [birth year] DOA: 09/18/2023 DOS: the patient was seen and examined on 09/18/2023 PCP: [place], [name], MD  Patient coming from: Home  Chief Complaint:  Chief Complaint  Patient presents with   Abdominal Pain   HPI: [name] is a 73 y.o. male with medical history significant of hypertension, hyperlipidemia, CAD s/p CABG 2012 with subsequent DES to LCx and RCA in 2016, COPD, tobacco abuse, alcohol abuse, GERD presents with complaints of right upper abdominal pain and nausea. He is accompanied by his son.  He has been experiencing persistent right upper abdominal pain since yesterday around 4 PM. The pain is sharp and located right under his rib. He has had two episodes of vomiting since the onset of pain, with no blood in the vomit. No fevers or chills.  The day prior to onset of symptoms he had reported having upset stomach and only ate breakfast that day.  Normally patient has chronic back pain and was unclear if some of his symptoms or related.  He chronically wheezes, but his son thinks symptoms have been worsening. He denies using CPAP or BiPAP at night. He has inhalers but rarely uses them. He has a history of heart issues and underwent a stress test at the end of last year that was normal. He smokes about a pack of cigarettes per day.  He reports drinking a six-pack or more of alcohol daily, though he only consumed two drinks yesterday due to feeling unwell.     On admission into the emergency department patient noted to be afebrile with pulse 56-59, blood pressures are 73-1 58/87, and all other vital signs maintained.  Labs were relatively well including high-sensitivity troponins negative x 2 and LFTs within normal limits. Ultrasound of the gallbladder noted gallbladder sludge with a distended and focally tender gallbladder concerning for cholecystitis versus obstruction given normal LFTs.  Review of Systems: As  mentioned in the history of present illness. All other systems reviewed and are negative. Past Medical History:  Diagnosis Date   Arthritis    B12 deficiency    Borderline   CAD (coronary artery disease)    a. s/p CABG 01/2011;  b. ETT Myoview 3/14:  Low risk;  c. 09/2014 Cath/PCI: LM nl, LAD 100p, LCX 45m/OM2 60-70p (2.75x28 Synergy DES), RCA dom, 23m (atherectomy, 3.5x24 Synergy DES), PDA nl, LIMA->LAD nl, VG->Diag nl, VG->OM 100, VG->RCA 100.// Myoview 06/23/2023: No ischemia or infarction, EF 65, low risk   Cataract    bilateral sx    COPD (chronic obstructive pulmonary disease) (HCC)    GERD (gastroesophageal reflux disease)    on meds   Hiatal hernia    History of echocardiogram    Echo 10/16: EF 50-55%, no RWMA, Gr 1 DD, mild MR, mild TR   History of kidney stones    Passed   HLD (hyperlipidemia)    on meds   HOH (hard of hearing)    Hypertension    on meds   Leg pain    ABIs 4/14:  R 1.2, L 1.2, TBIs normal   Peripheral neuropathy    From trauma   Reactive airway disease 03/18/2018   Tobacco abuse    Past Surgical History:  Procedure Laterality Date   COLONOSCOPY  03/09/2020   Neg   COLONOSCOPY WITH PROPOFOL N/A 05/08/2020   Procedure: COLONOSCOPY WITH PROPOFOL;  Surgeon: Lemar Lofty., MD;  Location: [facility] ENDOSCOPY;  Service: Gastroenterology;  Laterality: N/A;   CORONARY ARTERY BYPASS GRAFT  2013   x5   CORONARY PRESSURE/FFR STUDY N/A 03/17/2018   Procedure: INTRAVASCULAR PRESSURE WIRE/FFR STUDY;  Surgeon: Runell Gess, MD;  Location: [facility] INVASIVE CV LAB;  Service: Cardiovascular;  Laterality: N/A;   ENDOSCOPIC MUCOSAL RESECTION N/A 05/08/2020   Procedure: ENDOSCOPIC MUCOSAL RESECTION;  Surgeon: Meridee Score Netty Starring., MD;  Location: [facility] ENDOSCOPY;  Service: Gastroenterology;  Laterality: N/A;   EPIDURAL STEROIDS     EYE SURGERY Bilateral    Cataract   HEMOSTASIS CLIP PLACEMENT  05/08/2020   Procedure: HEMOSTASIS CLIP PLACEMENT;  Surgeon: Lemar Lofty., MD;  Location: [facility] ENDOSCOPY;  Service: Gastroenterology;;   HEMOSTASIS CONTROL  05/08/2020   Procedure: HEMOSTASIS CONTROL;  Surgeon: Lemar Lofty., MD;  Location: [facility] ENDOSCOPY;  Service: Gastroenterology;;   LEFT HEART CATH AND CORONARY ANGIOGRAPHY N/A 03/17/2018   Procedure: LEFT HEART CATH AND CORONARY ANGIOGRAPHY;  Surgeon: Runell Gess, MD;  Location: [facility] INVASIVE CV LAB;  Service: Cardiovascular;  Laterality: N/A;   LEFT HEART CATH AND CORS/GRAFTS ANGIOGRAPHY N/A 10/16/2020   Procedure: LEFT HEART CATH AND CORS/GRAFTS ANGIOGRAPHY;  Surgeon: Tonny Bollman, MD;  Location: [facility] INVASIVE CV LAB;  Service: Cardiovascular;  Laterality: N/A;   LEFT HEART CATHETERIZATION WITH CORONARY/GRAFT ANGIOGRAM N/A 09/18/2014   Procedure: LEFT HEART CATHETERIZATION WITH Isabel Caprice;  Surgeon: Corky Crafts, MD;  Location: [facility] CATH LAB;  Service: Cardiovascular;  Laterality: N/A;   PERCUTANEOUS CORONARY ROTOBLATOR INTERVENTION (PCI-R) N/A 09/20/2014   Procedure: PERCUTANEOUS CORONARY ROTOBLATOR INTERVENTION (PCI-R);  Surgeon: Corky Crafts, MD;  Location: [facility] CATH LAB;  Service: Cardiovascular;  Laterality: N/A;   POLYPECTOMY  02/28/2020   6 polyps/tics/hems   POLYPECTOMY  05/08/2020   Procedure: POLYPECTOMY;  Surgeon: Meridee Score Netty Starring., MD;  Location: [facility] ENDOSCOPY;  Service: Gastroenterology;;   Susa Day  05/08/2020   Procedure: Susa Day;  Surgeon: Mansouraty, Netty Starring., MD;  Location: [facility] ENDOSCOPY;  Service: Gastroenterology;;   SUBMUCOSAL LIFTING INJECTION  05/08/2020   Procedure: SUBMUCOSAL LIFTING INJECTION;  Surgeon: Lemar Lofty., MD;  Location: [facility] ENDOSCOPY;  Service: Gastroenterology;;   Social History:  reports that he has been smoking cigarettes. He started smoking about 55 years ago. He has a 55.2 pack-year smoking history. He has been exposed to tobacco smoke. He has never used smokeless tobacco. He reports current alcohol use  of about 3.0 standard drinks of alcohol per week. He reports that he does not use drugs.  Allergies  Allergen Reactions   Iodine Nausea And Vomiting    Turns white in face and sweaty   Iohexol Other (See Comments)    May have caused nausea and vomiting several years ago    Shrimp [Shellfish Allergy] Nausea And Vomiting    Turns white in face and sweaty   Isosorbide Dinitrate Other (See Comments)    headache    Family History  Problem Relation Age of Onset   Diabetes Father    Heart attack Father 32   Heart disease Father        CAD   Cancer Mother    Diabetes Brother    Diabetes Brother    Colon cancer Neg Hx    Esophageal cancer Neg Hx    Stomach cancer Neg Hx    Pancreatic cancer Neg Hx    Inflammatory bowel disease Neg Hx    Liver disease Neg Hx    Rectal cancer Neg Hx    Colon polyps Neg Hx  Prior to Admission medications   Medication Sig Start Date End Date Taking? Authorizing Provider  albuterol (VENTOLIN HFA) 108 (90 Base) MCG/ACT inhaler INHALE 1-2 PUFFS BY MOUTH INTO THE LUNGS EVERY 6 HOURS AS NEEDED FOR WHEEZING AND/OR FOR SHORTNESS OF BREATH 03/23/23   [place], [name], MD  aspirin EC 81 MG tablet Take 81 mg by mouth at bedtime.     [provider]  atorvastatin (LIPITOR) 80 MG tablet TAKE 1 TABLET (80 MG TOTAL) BY MOUTH EVERY MORNING. 06/22/23   Tonny Bollman, MD  clopidogrel (PLAVIX) 75 MG tablet TAKE 1 TABLET BY MOUTH EVERY MORNING 06/22/23   Tonny Bollman, MD  DULoxetine (CYMBALTA) 60 MG capsule TAKE 1 CAPSULE BY MOUTH ONCE DAILY 07/26/23   [place], [name], MD  ezetimibe (ZETIA) 10 MG tablet TAKE 1 TABLET BY MOUTH DAILY 06/22/23   Tonny Bollman, MD  gabapentin (NEURONTIN) 300 MG capsule TAKE 1 CAPSULE BY MOUTH AT BEDTIME 04/27/23   [place], [name], MD  metoprolol succinate (TOPROL-XL) 50 MG 24 hr tablet TAKE 1 TABLET BY MOUTH ONCE DAILY WITH A MEAL 06/22/23   Tonny Bollman, MD  nitroGLYCERIN (NITROSTAT) 0.4 MG SL tablet Place 1 tablet (0.4 mg total)  under the tongue every 5 (five) minutes as needed for chest pain. PLACE 1 TABLET UNDER THE TONGUE EVERY 5 MINUTES X 3 DOSES AS NEEDED FOR CHEST PAIN. 12/04/21   Swinyer, Zachary George, NP  pantoprazole (PROTONIX) 40 MG tablet TAKE 1 TABLET BY MOUTH DAILY 06/22/23   Tonny Bollman, MD  umeclidinium-vilanterol [facility] ELLIPTA) 62.5-25 MCG/ACT AEPB Inhale 1 puff into the lungs daily at 6 (six) AM. 04/08/23   [place], [name], MD  vitamin B-12 (CYANOCOBALAMIN) 1000 MCG tablet Take 1,000 mcg by mouth daily.     [provider]    Physical Exam: Vitals:   09/18/23 0845 09/18/23 0930 09/18/23 0945 09/18/23 1015  BP: 108/70 121/74  122/68  Pulse: 64 63  63  Resp: (!) 23 (!) 22  (!) 23  Temp:   97.9 F (36.6 C)   SpO2: 93% 97%  100%   Exam  Constitutional: Elderly male who appears to be in no acute distress Eyes: PERRL, lids and conjunctivae normal ENMT: Mucous membranes are dry normal dentition.  Neck: normal, supple   Respiratory: Diffuse expiratory wheezes are heard throughout both lung fields. Cardiovascular: Regular rate and rhythm, no murmurs / rubs / gallops. No extremity edema. 2+ pedal pulses.  Abdomen: Moderate tenderness to palpation of the right upper quadrant.  Bowel sounds present in all 4 quadrants. Musculoskeletal: no clubbing / cyanosis. No joint deformity upper and lower extremities. Good ROM, no contractures. Normal muscle tone.  Skin: no rashes, lesions, ulcers. No induration Neurologic: CN 2-12 grossly intact.   Strength 5/5 in all 4.  Psychiatric: Normal judgment and insight. Alert and oriented x 3. Normal mood.   Data Reviewed:  reviewed labs, imaging, and pertinent records as documented.  Assessment and Plan:  Right upper quadrant abdominal pain Suspected acute cholecystitis Patient presents with right upper quadrant abdominal pain.  Ultrasound revealed gallbladder sludge with distended and focally tender gallbladder concerning for cholecystitis.  He had  received pain medication and Rocephin IV. -Admit to a medical telemetry bed and -Check HIDA scan  -Okay for clears after HIDA scan -Continue Rocephin IV -Fentanyl IV as needed for pain -Normal saline IV fluids at 75 mL/h -Zofran IV as needed -General Surgery consulted, will follow-up for any further recommendations  COPD with exacerbation  Patient reports chronically wheezes, but symptoms have been getting worse recently.  On physical exam patient with decreased overall aeration and diffuse expiratory wheezes heard in both lung fields. -Nasal cannula oxygen as needed to maintain O2 saturation greater than 90% -Incentive spirometry -Check respiratory virus panel -DuoNebs 4 times daily and albuterol nebs as needed -Prednisone -Brovana and budesonide nebs twice daily  Essential hypertension Blood pressures currently maintained -Continue metoprolol  CAD s/p CABG Hyperlipidemia Patient reported brief episode of chest pain.  High-sensitivity troponins are negative x 2.  Had a history of CABG in 2012 with subsequent stenting in 2016 and is on Plavix.  Records note patient had stress testing 06/2023 which was noted to be normal. -Hold Plavix for need of procedure -Continue atorvastatin  Alcohol abuse  Patient reports drinking a sixpack of beer per day on average, but states that he only had 2 yesterday. -CIWA protocols initiated with scheduled and as needed Ativan -MVI, thiamine, folic acid  Tobacco abuse Patient reports smoking a pack of cigarettes per day for over 45 years -Nicotine patch offered but patient declined  GERD Hiatal hernia -Protonix IV  DVT prophylaxis: Lovenox Advance Care Planning:   Code Status: Full Code    Consults: General Surgery  Family Communication: Son updated at bedside  Severity of Illness: The appropriate patient status for this patient is INPATIENT. Inpatient status is judged to be reasonable and necessary in order to provide the required  intensity of service to ensure the patient's safety. The patient's presenting symptoms, physical exam findings, and initial radiographic and laboratory data in the context of their chronic comorbidities is felt to place them at high risk for further clinical deterioration. Furthermore, it is not anticipated that the patient will be medically stable for discharge from the hospital within 2 midnights of admission.   * I certify that at the point of admission it is my clinical judgment that the patient will require inpatient hospital care spanning beyond 2 midnights from the point of admission due to high intensity of service, high risk for further deterioration and high frequency of surveillance required.*  Author: Clydie Braun, MD 09/18/2023 11:42 AM  For on call review [email or web address].

## 2023-09-18 NOTE — Progress Notes (Signed)
 CSW added substance abuse resources to patient's AVS.  Edwin Dada, MSW, LCSW Transitions of Care  Clinical Social Worker II [phone]

## 2023-09-19 ENCOUNTER — Inpatient Hospital Stay (HOSPITAL_BASED_OUTPATIENT_CLINIC_OR_DEPARTMENT_OTHER): Admitting: Certified Registered Nurse Anesthetist

## 2023-09-19 ENCOUNTER — Encounter (HOSPITAL_COMMUNITY)
Admission: EM | Disposition: A | Discharge status: Home / Self Care | Payer: Self-pay | Source: Home / Self Care | Attending: Emergency Medicine

## 2023-09-19 ENCOUNTER — Inpatient Hospital Stay (HOSPITAL_COMMUNITY): Admitting: Certified Registered Nurse Anesthetist

## 2023-09-19 ENCOUNTER — Encounter (HOSPITAL_COMMUNITY): Payer: Self-pay | Admitting: Internal Medicine

## 2023-09-19 DIAGNOSIS — R1011 Right upper quadrant pain: Secondary | ICD-10-CM | POA: Diagnosis not present

## 2023-09-19 DIAGNOSIS — I10 Essential (primary) hypertension: Secondary | ICD-10-CM

## 2023-09-19 DIAGNOSIS — K812 Acute cholecystitis with chronic cholecystitis: Secondary | ICD-10-CM | POA: Diagnosis not present

## 2023-09-19 DIAGNOSIS — I2510 Atherosclerotic heart disease of native coronary artery without angina pectoris: Secondary | ICD-10-CM | POA: Diagnosis not present

## 2023-09-19 DIAGNOSIS — K819 Cholecystitis, unspecified: Secondary | ICD-10-CM | POA: Diagnosis not present

## 2023-09-19 DIAGNOSIS — K810 Acute cholecystitis: Secondary | ICD-10-CM | POA: Diagnosis not present

## 2023-09-19 DIAGNOSIS — J449 Chronic obstructive pulmonary disease, unspecified: Secondary | ICD-10-CM

## 2023-09-19 HISTORY — PX: CHOLECYSTECTOMY: SHX55

## 2023-09-19 LAB — COMPREHENSIVE METABOLIC PANEL
ALT: 25 U/L (ref 0–44)
AST: 23 U/L (ref 15–41)
Albumin: 3.1 g/dL — ABNORMAL LOW (ref 3.5–5.0)
Alkaline Phosphatase: 92 U/L (ref 38–126)
Anion gap: 17 — ABNORMAL HIGH (ref 5–15)
BUN: 15 mg/dL (ref 8–23)
CO2: 21 mmol/L — ABNORMAL LOW (ref 22–32)
Calcium: 9.5 mg/dL (ref 8.9–10.3)
Chloride: 99 mmol/L (ref 98–111)
Creatinine, Ser: 0.75 mg/dL (ref 0.61–1.24)
GFR, Estimated: >60 mL/min (ref >60)
Glucose, Bld: 157 mg/dL — ABNORMAL HIGH (ref 70–99)
Potassium: 4.1 mmol/L (ref 3.5–5.1)
Sodium: 137 mmol/L (ref 135–145)
Total Bilirubin: 1 mg/dL (ref 0.0–1.2)
Total Protein: 6.7 g/dL (ref 6.5–8.1)

## 2023-09-19 LAB — CBC
HCT: 38.7 % — ABNORMAL LOW (ref 39.0–52.0)
Hemoglobin: 13.3 g/dL (ref 13.0–17.0)
MCH: 31.6 pg (ref 26.0–34.0)
MCHC: 34.4 g/dL (ref 30.0–36.0)
MCV: 91.9 fL (ref 80.0–100.0)
Platelets: 205 10*3/uL (ref 150–400)
RBC: 4.21 MIL/uL — ABNORMAL LOW (ref 4.22–5.81)
RDW: 13 % (ref 11.5–15.5)
WBC: 6.5 10*3/uL (ref 4.0–10.5)
nRBC: 0 % (ref 0.0–0.2)

## 2023-09-19 LAB — PHOSPHORUS: Phosphorus: 3.7 mg/dL (ref 2.5–4.6)

## 2023-09-19 LAB — MAGNESIUM: Magnesium: 1.9 mg/dL (ref 1.7–2.4)

## 2023-09-19 LAB — HIV ANTIBODY (ROUTINE TESTING W REFLEX): HIV Screen 4th Generation wRfx: NONREACTIVE

## 2023-09-19 LAB — PROTIME-INR
INR: 1.1 (ref 0.8–1.2)
Prothrombin Time: 14.7 s (ref 11.4–15.2)

## 2023-09-19 SURGERY — LAPAROSCOPIC CHOLECYSTECTOMY
Anesthesia: General

## 2023-09-19 MED ORDER — LIDOCAINE 2% (20 MG/ML) 5 ML SYRINGE
INTRAMUSCULAR | Status: DC | PRN
  Administered 2023-09-19: 50 mg via INTRAVENOUS

## 2023-09-19 MED ORDER — PHENYLEPHRINE 80 MCG/ML (10ML) SYRINGE FOR IV PUSH (FOR BLOOD PRESSURE SUPPORT)
PREFILLED_SYRINGE | INTRAVENOUS | Status: DC | PRN
  Administered 2023-09-19: 160 ug via INTRAVENOUS
  Administered 2023-09-19: 80 ug via INTRAVENOUS
  Administered 2023-09-19 (×2): 160 ug via INTRAVENOUS

## 2023-09-19 MED ORDER — 0.9 % SODIUM CHLORIDE (POUR BTL) OPTIME
TOPICAL | Status: DC | PRN
  Administered 2023-09-19: 1000 mL

## 2023-09-19 MED ORDER — ROCURONIUM BROMIDE 10 MG/ML (PF) SYRINGE
PREFILLED_SYRINGE | INTRAVENOUS | Status: DC | PRN
  Administered 2023-09-19: 50 mg via INTRAVENOUS

## 2023-09-19 MED ORDER — ACETAMINOPHEN 160 MG/5ML PO SOLN: 325.0000 mg | ORAL | Status: DC | PRN

## 2023-09-19 MED ORDER — ONDANSETRON HCL 4 MG/2ML IJ SOLN
INTRAMUSCULAR | Status: AC
  Filled 2023-09-19: qty 2

## 2023-09-19 MED ORDER — CHLORHEXIDINE GLUCONATE 0.12 % MT SOLN: 15.0000 mL | Freq: Once | OROMUCOSAL | Status: AC

## 2023-09-19 MED ORDER — SODIUM CHLORIDE 0.9 % IR SOLN
Status: DC | PRN
  Administered 2023-09-19: 1000 mL

## 2023-09-19 MED ORDER — FENTANYL CITRATE (PF) 100 MCG/2ML IJ SOLN
25.0000 ug | INTRAMUSCULAR | Status: DC | PRN
  Administered 2023-09-19: 50 ug via INTRAVENOUS

## 2023-09-19 MED ORDER — FENTANYL CITRATE (PF) 100 MCG/2ML IJ SOLN
INTRAMUSCULAR | Status: AC
Start: 2023-09-19 — End: 2023-09-20
  Filled 2023-09-19: qty 2

## 2023-09-19 MED ORDER — ORAL CARE MOUTH RINSE: 15.0000 mL | Freq: Once | OROMUCOSAL | Status: AC

## 2023-09-19 MED ORDER — PHENYLEPHRINE 80 MCG/ML (10ML) SYRINGE FOR IV PUSH (FOR BLOOD PRESSURE SUPPORT)
PREFILLED_SYRINGE | INTRAVENOUS | Status: AC
  Filled 2023-09-19: qty 10

## 2023-09-19 MED ORDER — PROPOFOL 10 MG/ML IV BOLUS
INTRAVENOUS | Status: AC
  Filled 2023-09-19: qty 20

## 2023-09-19 MED ORDER — ONDANSETRON HCL 4 MG/2ML IJ SOLN
INTRAMUSCULAR | Status: DC | PRN
  Administered 2023-09-19: 4 mg via INTRAVENOUS

## 2023-09-19 MED ORDER — PROPOFOL 10 MG/ML IV BOLUS
INTRAVENOUS | Status: DC | PRN
  Administered 2023-09-19: 150 mg via INTRAVENOUS

## 2023-09-19 MED ORDER — HEMOSTATIC AGENTS (NO CHARGE) OPTIME
TOPICAL | Status: DC | PRN
  Administered 2023-09-19: 1 via TOPICAL

## 2023-09-19 MED ORDER — ACETAMINOPHEN 10 MG/ML IV SOLN
INTRAVENOUS | Status: AC
  Filled 2023-09-19: qty 100

## 2023-09-19 MED ORDER — DEXAMETHASONE SODIUM PHOSPHATE 10 MG/ML IJ SOLN
INTRAMUSCULAR | Status: DC | PRN
  Administered 2023-09-19: 10 mg via INTRAVENOUS

## 2023-09-19 MED ORDER — FENTANYL CITRATE (PF) 250 MCG/5ML IJ SOLN
INTRAMUSCULAR | Status: AC
  Filled 2023-09-19: qty 5

## 2023-09-19 MED ORDER — LIDOCAINE 2% (20 MG/ML) 5 ML SYRINGE
INTRAMUSCULAR | Status: AC
  Filled 2023-09-19: qty 5

## 2023-09-19 MED ORDER — ACETAMINOPHEN 10 MG/ML IV SOLN: 1000.0000 mg | Freq: Once | INTRAVENOUS | Status: DC | PRN

## 2023-09-19 MED ORDER — INDOCYANINE GREEN 25 MG IV SOLR
2.5000 mg | Freq: Once | INTRAVENOUS | Status: AC
  Administered 2023-09-19: 2.5 mg via INTRAVENOUS
  Filled 2023-09-19: qty 1

## 2023-09-19 MED ORDER — SUGAMMADEX SODIUM 200 MG/2ML IV SOLN
INTRAVENOUS | Status: DC | PRN
  Administered 2023-09-19: 200 mg via INTRAVENOUS

## 2023-09-19 MED ORDER — BUPIVACAINE-EPINEPHRINE (PF) 0.25% -1:200000 IJ SOLN
INTRAMUSCULAR | Status: DC | PRN
  Administered 2023-09-19: 30 mL

## 2023-09-19 MED ORDER — OXYCODONE HCL 5 MG/5ML PO SOLN: 5.0000 mg | Freq: Once | ORAL | Status: DC | PRN

## 2023-09-19 MED ORDER — DROPERIDOL 2.5 MG/ML IJ SOLN: 0.6250 mg | Freq: Once | INTRAMUSCULAR | Status: DC | PRN

## 2023-09-19 MED ORDER — ACETAMINOPHEN 10 MG/ML IV SOLN
INTRAVENOUS | Status: DC | PRN
  Administered 2023-09-19: 1000 mg via INTRAVENOUS

## 2023-09-19 MED ORDER — CHLORHEXIDINE GLUCONATE 0.12 % MT SOLN
OROMUCOSAL | Status: AC
  Administered 2023-09-19: 15 mL via OROMUCOSAL
  Filled 2023-09-19: qty 15

## 2023-09-19 MED ORDER — CHLORHEXIDINE GLUCONATE CLOTH 2 % EX PADS
6.0000 | MEDICATED_PAD | Freq: Once | CUTANEOUS | Status: DC
Start: 2023-09-19 — End: 2023-09-19

## 2023-09-19 MED ORDER — ACETAMINOPHEN 325 MG PO TABS: 325.0000 mg | ORAL_TABLET | ORAL | Status: DC | PRN

## 2023-09-19 MED ORDER — EPHEDRINE SULFATE-NACL 50-0.9 MG/10ML-% IV SOSY
PREFILLED_SYRINGE | INTRAVENOUS | Status: DC | PRN
  Administered 2023-09-19: 10 mg via INTRAVENOUS

## 2023-09-19 MED ORDER — OXYCODONE HCL 5 MG PO TABS: 5.0000 mg | ORAL_TABLET | Freq: Once | ORAL | Status: DC | PRN

## 2023-09-19 MED ORDER — BUPIVACAINE-EPINEPHRINE (PF) 0.25% -1:200000 IJ SOLN
INTRAMUSCULAR | Status: AC
  Filled 2023-09-19: qty 30

## 2023-09-19 MED ORDER — CHLORHEXIDINE GLUCONATE CLOTH 2 % EX PADS: 6.0000 | MEDICATED_PAD | Freq: Once | CUTANEOUS | Status: DC

## 2023-09-19 MED ORDER — MIDAZOLAM HCL 2 MG/2ML IJ SOLN
INTRAMUSCULAR | Status: AC
  Filled 2023-09-19: qty 2

## 2023-09-19 MED ORDER — LACTATED RINGERS IV SOLN
INTRAVENOUS | Status: DC
Start: 2023-09-19 — End: 2023-09-19

## 2023-09-19 MED ORDER — FENTANYL CITRATE (PF) 250 MCG/5ML IJ SOLN
INTRAMUSCULAR | Status: DC | PRN
  Administered 2023-09-19: 100 ug via INTRAVENOUS
  Administered 2023-09-19 (×3): 50 ug via INTRAVENOUS

## 2023-09-19 MED ORDER — DEXAMETHASONE SODIUM PHOSPHATE 10 MG/ML IJ SOLN
INTRAMUSCULAR | Status: AC
  Filled 2023-09-19: qty 1

## 2023-09-19 MED ORDER — PHENYLEPHRINE HCL-NACL 20-0.9 MG/250ML-% IV SOLN
INTRAVENOUS | Status: DC | PRN
  Administered 2023-09-19: 50 ug/min via INTRAVENOUS

## 2023-09-19 MED ORDER — ROCURONIUM BROMIDE 10 MG/ML (PF) SYRINGE
PREFILLED_SYRINGE | INTRAVENOUS | Status: AC
  Filled 2023-09-19: qty 10

## 2023-09-19 SURGICAL SUPPLY — 37 items
APPLIER CLIP ROT 10 11.4 M/L (STAPLE) ×1 IMPLANT
CANISTER SUCT 3000ML PPV (MISCELLANEOUS) ×1 IMPLANT
CHLORAPREP W/TINT 26 (MISCELLANEOUS) ×1 IMPLANT
CLIP APPLIE ROT 10 11.4 M/L (STAPLE) ×1 IMPLANT
COVER SURGICAL LIGHT HANDLE (MISCELLANEOUS) ×1 IMPLANT
DERMABOND ADVANCED .7 DNX12 (GAUZE/BANDAGES/DRESSINGS) ×1 IMPLANT
ELECT REM PT RETURN 9FT ADLT (ELECTROSURGICAL) ×1 IMPLANT
ELECTRODE REM PT RTRN 9FT ADLT (ELECTROSURGICAL) ×1 IMPLANT
GLOVE BIO SURGEON STRL SZ7 (GLOVE) ×1 IMPLANT
GOWN STRL REUS W/ TWL XL LVL3 (GOWN DISPOSABLE) ×1 IMPLANT
GRASPER SUT TROCAR 14GX15 (MISCELLANEOUS) ×1 IMPLANT
HEMOSTAT SNOW SURGICEL 2X4 (HEMOSTASIS) IMPLANT
IRRIG SUCT STRYKERFLOW 2 WTIP (MISCELLANEOUS) ×1 IMPLANT
IRRIGATION SUCT STRKRFLW 2 WTP (MISCELLANEOUS) ×1 IMPLANT
KIT BASIN OR (CUSTOM PROCEDURE TRAY) ×1 IMPLANT
KIT IMAGING PINPOINTPAQ (MISCELLANEOUS) IMPLANT
KIT TURNOVER KIT B (KITS) ×1 IMPLANT
L-HOOK LAP DISP 36CM (ELECTROSURGICAL) ×1 IMPLANT
LHOOK LAP DISP 36CM (ELECTROSURGICAL) ×1 IMPLANT
NDL 22X1.5 STRL (OR ONLY) (MISCELLANEOUS) ×1 IMPLANT
NDL INSUFFLATION 14GA 120MM (NEEDLE) ×1 IMPLANT
NEEDLE 22X1.5 STRL (OR ONLY) (MISCELLANEOUS) ×1 IMPLANT
NEEDLE INSUFFLATION 14GA 120MM (NEEDLE) ×1 IMPLANT
NS IRRIG 1000ML POUR BTL (IV SOLUTION) ×1 IMPLANT
PAD ARMBOARD 7.5X6 YLW CONV (MISCELLANEOUS) ×1 IMPLANT
PENCIL BUTTON HOLSTER BLD 10FT (ELECTRODE) ×1 IMPLANT
POUCH RETRIEVAL ECOSAC 10 (ENDOMECHANICALS) ×1 IMPLANT
SCISSORS LAP 5X35 DISP (ENDOMECHANICALS) ×1 IMPLANT
SET TUBE SMOKE EVAC HIGH FLOW (TUBING) ×1 IMPLANT
SLEEVE Z-THREAD 5X100MM (TROCAR) ×2 IMPLANT
SUT MNCRL AB 4-0 PS2 18 (SUTURE) ×1 IMPLANT
TOWEL GREEN STERILE FF (TOWEL DISPOSABLE) ×1 IMPLANT
TRAY LAPAROSCOPIC MC (CUSTOM PROCEDURE TRAY) ×1 IMPLANT
TROCAR Z THREAD OPTICAL 12X100 (TROCAR) ×1 IMPLANT
TROCAR Z-THREAD OPTICAL 5X100M (TROCAR) ×1 IMPLANT
WARMER LAPAROSCOPE (MISCELLANEOUS) ×1 IMPLANT
WATER STERILE IRR 1000ML POUR (IV SOLUTION) ×1 IMPLANT

## 2023-09-19 NOTE — Anesthesia Preprocedure Evaluation (Addendum)
 Anesthesia Evaluation  Patient identified by MRN, date of birth, ID band Patient awake    Reviewed: Allergy & Precautions, NPO status , Patient's Chart, lab work & pertinent test results  Airway Mallampati: I  TM Distance: >3 FB Neck ROM: Full    Dental  (+) Poor Dentition, Missing, Dental Advisory Given, Chipped   Pulmonary COPD, Current Smoker    + decreased breath sounds      Cardiovascular hypertension, Pt. on home beta blockers + CAD   Rhythm:Regular Rate:Normal     Neuro/Psych   Anxiety      Neuromuscular disease    GI/Hepatic Neg liver ROS, hiatal hernia,GERD  Medicated,,  Endo/Other  negative endocrine ROS    Renal/GU negative Renal ROS     Musculoskeletal  (+) Arthritis ,    Abdominal   Peds  Hematology negative hematology ROS (+)   Anesthesia Other Findings   Reproductive/Obstetrics                             Anesthesia Physical Anesthesia Plan  ASA: 3  Anesthesia Plan: General   Post-op Pain Management: Toradol IV (intra-op)* and Ofirmev IV (intra-op)*   Induction: Intravenous  PONV Risk Score and Plan: 2 and Ondansetron and Dexamethasone  Airway Management Planned: Oral ETT  Additional Equipment: None  Intra-op Plan:   Post-operative Plan: Extubation in OR  Informed Consent: I have reviewed the patients History and Physical, chart, labs and discussed the procedure including the risks, benefits and alternatives for the proposed anesthesia with the patient or authorized representative who has indicated his/her understanding and acceptance.     Dental advisory given  Plan Discussed with: CRNA  Anesthesia Plan Comments:        Anesthesia Quick Evaluation

## 2023-09-19 NOTE — Transfer of Care (Signed)
 Immediate Anesthesia Transfer of Care Note  Patient: Derrick Stokes  Procedure(s) Performed: LAPAROSCOPIC CHOLECYSTECTOMY INDOCYANINE GREEN FLUORESCENCE IMAGING (ICG)  Patient Location: PACU  Anesthesia Type:General  Level of Consciousness: drowsy and patient cooperative  Airway & Oxygen Therapy: Patient Spontanous Breathing and Patient connected to face mask oxygen  Post-op Assessment: Report given to RN and Post -op Vital signs reviewed and stable  Post vital signs: Reviewed and stable  Last Vitals:  Vitals Value Taken Time  BP 120/61 09/19/23 1630  Temp 37.1 C 09/19/23 1629  Pulse 84 09/19/23 1636  Resp 26 09/19/23 1636  SpO2 93 % 09/19/23 1636  Vitals shown include unfiled device data.  Last Pain:  Vitals:   09/19/23 1407  TempSrc:   PainSc: 0-No pain         Complications: There were no known notable events for this encounter.

## 2023-09-19 NOTE — Anesthesia Procedure Notes (Signed)
 Procedure Name: Intubation Date/Time: 09/19/2023 2:49 PM  Performed by: Gloris Ham, CRNAPre-anesthesia Checklist: Patient identified, Emergency Drugs available, Suction available and Patient being monitored Patient Re-evaluated:Patient Re-evaluated prior to induction Oxygen Delivery Method: Circle System Utilized Preoxygenation: Pre-oxygenation with 100% oxygen Induction Type: IV induction Ventilation: Mask ventilation without difficulty Laryngoscope Size: Mac and 4 Grade View: Grade II Tube type: Oral Endobronchial tube: EBT position confirmed by auscultation Tube size: 7.5 mm Number of attempts: 1 Airway Equipment and Method: Stylet and Oral airway Placement Confirmation: ETT inserted through vocal cords under direct vision, positive ETCO2 and breath sounds checked- equal and bilateral Tube secured with: Tape Dental Injury: Teeth and Oropharynx as per pre-operative assessment  Comments: Very poor dentition, jagged teeth for all that were still present.

## 2023-09-19 NOTE — Progress Notes (Signed)
 Subjective: Reports his pain has improved somewhat but he has not eaten or drank anything.  HIDA c/w acute cholecystitis.   ROS: See above, otherwise other systems negative  Objective: Vital signs in last 24 hours: Temp:  [97.5 F (36.4 C)-98.2 F (36.8 C)] 97.5 F (36.4 C) (03/02 0514) Pulse Rate:  [59-88] 68 (03/02 0628) Resp:  [18-23] 18 (03/02 0514) BP: (105-131)/(68-84) 122/78 (03/02 0628) SpO2:  [93 %-100 %] 100 % (03/02 0514)    Intake/Output from previous day: No intake/output data recorded. Intake/Output this shift: No intake/output data recorded.  PE: Gen: male, NAD Abd: soft, non-distended, mild TTP in the RUQ w/ deep palpation  Lab Results:  Recent Labs    09/18/23 0237 09/19/23 0652  WBC 8.8 6.5  HGB 14.6 13.3  HCT 42.0 38.7*  PLT 204 205   BMET Recent Labs    09/18/23 0320  NA 134*  K 4.1  CL 100  CO2 26  GLUCOSE 136*  BUN 12  CREATININE 0.76  CALCIUM 9.5   PT/INR No results for input(s): "LABPROT", "INR" in the last 72 hours. CMP     Component Value Date/Time   NA 134 (L) 09/18/2023 0320   NA 136 10/07/2020 0930   K 4.1 09/18/2023 0320   CL 100 09/18/2023 0320   CO2 26 09/18/2023 0320   GLUCOSE 136 (H) 09/18/2023 0320   BUN 12 09/18/2023 0320   BUN 12 10/07/2020 0930   CREATININE 0.76 09/18/2023 0320   CREATININE 0.84 02/05/2020 1417   CALCIUM 9.5 09/18/2023 0320   PROT 7.3 09/18/2023 0320   PROT 7.0 03/08/2019 0905   ALBUMIN 3.7 09/18/2023 0320   ALBUMIN 4.3 03/08/2019 0905   AST 27 09/18/2023 0320   ALT 26 09/18/2023 0320   ALKPHOS 106 09/18/2023 0320   BILITOT 1.0 09/18/2023 0320   BILITOT 0.9 03/08/2019 0905   GFRNONAA >60 09/18/2023 0320   GFRAA 109 03/08/2019 0905   Lipase     Component Value Date/Time   LIPASE 24 09/18/2023 0320    Studies/Results: NM Hepatobiliary Liver Func Result Date: 09/18/2023 CLINICAL DATA:  Findings suspicious for acute cholecystitis, evaluate cystic duct patency EXAM: NUCLEAR  MEDICINE HEPATOBILIARY IMAGING TECHNIQUE: Sequential images of the abdomen were obtained out to 60 minutes following intravenous administration of radiopharmaceutical. RADIOPHARMACEUTICALS:  Five mCi Tc-27m  Choletec IV COMPARISON:  None Available. FINDINGS: Normal uptake and excretion of biliary tracer is noted from the liver. Common bile duct is within normal limits. Free flow into the duodenum is seen. No gallbladder activity is noted in the first hour. 3 mg morphine was then administered to try to stimulate cystic duct filling. Imaging was performed for 30 minutes and no cystic duct or gallbladder filling was noted. IMPRESSION: Cystic duct does not appear to be patent despite morphine challenge. These findings would be consistent with acute cholecystitis. Electronically Signed   By: Alcide Clever M.D.   On: 09/18/2023 19:15   US Abdomen Limited RUQ (LIVER/GB) Result Date: 09/18/2023 CLINICAL DATA:  Right upper quadrant pain EXAM: ULTRASOUND ABDOMEN LIMITED RIGHT UPPER QUADRANT COMPARISON:  02/19/2020 FINDINGS: Gallbladder: Full gallbladder with internal sludge. No shadowing stone detected separate from the sludge. There is wall thickening to 4 mm and focal tenderness. Common bile duct: Diameter: 5 mm Liver: Echogenic liver diffusely, usually indicating hepatocellular disease such as steatosis. Portal vein is patent on color Doppler imaging with normal direction of blood flow towards the liver. IMPRESSION: Gallbladder sludge with distended  and focally tender gallbladder that could be obstructed. Possible cholecystitis. Electronically Signed   By: Tiburcio Pea M.D.   On: 09/18/2023 06:22    Anti-infectives: Anti-infectives (From admission, onward)    Start     Dose/Rate Route Frequency Ordered Stop   09/19/23 1200  cefTRIAXone (ROCEPHIN) 2 g in sodium chloride 0.9 % 100 mL IVPB        2 g 200 mL/hr over 30 Minutes Intravenous Every 24 hours 09/18/23 1150     09/18/23 0715  cefTRIAXone (ROCEPHIN) 2 g  in sodium chloride 0.9 % 100 mL IVPB        2 g 200 mL/hr over 30 Minutes Intravenous  Once 09/18/23 0702 09/18/23 0859       Assessment/Plan 73 y/o M w/ a hx of CAD s/p CABG and DES, COPD, and EtOH use who presented with abdominal pain and now has imaging c/w acute cholecystitis  - Will proceed to the OR. We discussed the alternatives and potential risks of surgery, including but not limited to: bleeding, infection, damage to bowel or surrounding structure, bile leak, biliary system injury, pancreatitis, retained stone, and need for additional procedures. All questions were addressed and consent was obtained.     LOS: 1 day   Tacy Learn Surgery 09/19/2023, 7:23 AM Please see Amion for pager number during day hours 7:00am-4:30pm or 7:00am -11:30am on weekends

## 2023-09-19 NOTE — Anesthesia Postprocedure Evaluation (Signed)
 Anesthesia Post Note  Patient: Jekhi Bolin Brougher  Procedure(s) Performed: LAPAROSCOPIC CHOLECYSTECTOMY INDOCYANINE GREEN FLUORESCENCE IMAGING (ICG)     Patient location during evaluation: PACU Anesthesia Type: General Level of consciousness: awake and alert Pain management: pain level controlled Vital Signs Assessment: post-procedure vital signs reviewed and stable Respiratory status: spontaneous breathing, nonlabored ventilation, respiratory function stable and patient connected to nasal cannula oxygen Cardiovascular status: blood pressure returned to baseline and stable Postop Assessment: no apparent nausea or vomiting Anesthetic complications: no  There were no known notable events for this encounter.  Last Vitals:  Vitals:   09/19/23 1700 09/19/23 1940  BP:  97/66  Pulse: 77 76  Resp: (!) 21 18  Temp:  36.6 C  SpO2: 91% 91%    Last Pain:  Vitals:   09/19/23 1940  TempSrc: Oral  PainSc:                  Shelton Silvas

## 2023-09-19 NOTE — Progress Notes (Signed)
 PROGRESS NOTE    MOSS BERRY  ZOX:[id number] DOB: 07-23-1950 DOA: 09/18/2023 PCP: [place], [name], MD    Brief Narrative:  73 year old hypertension, hyperlipidemia, coronary artery disease status post CABG in 2012, DES left circumflex 2016, history of COPD, ongoing smoker and ongoing alcohol use presented with right upper quadrant abdominal pain of 1 day and found to have acute cholecystitis on the basis of ultrasound and confirmed by HIDA scan.  Patient on Plavix with last dose taken 3/1 morning.  Drinks about 6 pack of beer a day, he could only drink 2 beers in the morning of presentation.  Subjective: Patient seen and examined.  Pain is better than yesterday but is still mildly present.  He is audibly wheezing.  Denies any chest pain or shortness of breath.  Anticipating surgery and patient is aware. Assessment & Plan:   Acute cholecystitis: N.p.o., IV fluids, IV antibiotics with Rocephin.  Adequate pain medications.  For lap chole today.  Surgery following.  COPD with exacerbation: Ongoing smoker. bronchodilator therapy, oral steroids, inhalational steroids, scheduled and as needed bronchodilators, deep breathing exercises, incentive spirometry, chest physiotherapy. Antibiotics due to severity of symptoms.  Already on Rocephin. Supplemental oxygen to keep saturations more than 90%. Nicotine patch.  Counseled.  Coronary artery disease status post CABG, hypertension hyperlipidemia: Currently chest pain-free.  On long-term Plavix.  Last dose 3/1.  Continue statin. Patient is acceptable to go for lap chole due to acute infection.  Alcoholism: High risk of withdrawal.  CIWA protocol with benzodiazepines.  Multivitamins.  GERD: On Protonix.    DVT prophylaxis: enoxaparin (LOVENOX) injection 40 mg Start: 09/18/23 1200   Code Status: Full code Family Communication: None at the bedside Disposition Plan: Status is: Inpatient Remains inpatient appropriate because: Inpatient surgery  anticipated     Consultants:  General surgery  Procedures:  Lap chole, planned  Antimicrobials:  Rocephin 3/1---     Objective: Vitals:   09/19/23 0514 09/19/23 0628 09/19/23 0725 09/19/23 1004  BP: 121/80 122/78  109/70  Pulse: 64 68  65  Resp: 18   16  Temp: (!) 97.5 F (36.4 C)   (!) 97.5 F (36.4 C)  TempSrc: Oral   Oral  SpO2: 100%  99% 94%    Intake/Output Summary (Last 24 hours) at 09/19/2023 1136 Last data filed at 09/19/2023 0900 Gross per 24 hour  Intake 0 ml  Output --  Net 0 ml   There were no vitals filed for this visit.  Examination:  General exam: Appears slightly anxious but comfortable. Respiratory system: Conducted upper airway sounds.  Inspiratory expiratory wheezes.  Looks fairly comfortable on room air. Cardiovascular system: S1 & S2 heard, RRR.  Gastrointestinal system: Soft.  Mild tenderness and guarding right upper quadrant.  Bowel sound present. Central nervous system: Alert and oriented. No focal neurological deficits. Extremities: Symmetric 5 x 5 power. Skin: No rashes, lesions or ulcers Psychiatry: Judgement and insight appear normal. Mood & affect appropriate.     Data Reviewed: I have personally reviewed following labs and imaging studies  CBC: Recent Labs  Lab 09/18/23 0237 09/19/23 0652  WBC 8.8 6.5  HGB 14.6 13.3  HCT 42.0 38.7*  MCV 92.3 91.9  PLT 204 205   Basic Metabolic Panel: Recent Labs  Lab 09/18/23 0320 09/19/23 0652  NA 134* 137  K 4.1 4.1  CL 100 99  CO2 26 21*  GLUCOSE 136* 157*  BUN 12 15  CREATININE 0.76 0.75  CALCIUM 9.5 9.5  MG  --  1.9  PHOS  --  3.7   GFR: CrCl cannot be calculated (Unknown ideal weight.). Liver Function Tests: Recent Labs  Lab 09/18/23 0320 09/19/23 0652  AST 27 23  ALT 26 25  ALKPHOS 106 92  BILITOT 1.0 1.0  PROT 7.3 6.7  ALBUMIN 3.7 3.1*   Recent Labs  Lab 09/18/23 0320  LIPASE 24   No results for input(s): "AMMONIA" in the last 168 hours. Coagulation  Profile: Recent Labs  Lab 09/19/23 0940  INR 1.1   Cardiac Enzymes: No results for input(s): "CKTOTAL", "CKMB", "CKMBINDEX", "TROPONINI" in the last 168 hours. BNP (last 3 results) No results for input(s): "PROBNP" in the last 8760 hours. HbA1C: No results for input(s): "HGBA1C" in the last 72 hours. CBG: No results for input(s): "GLUCAP" in the last 168 hours. Lipid Profile: No results for input(s): "CHOL", "HDL", "LDLCALC", "TRIG", "CHOLHDL", "LDLDIRECT" in the last 72 hours. Thyroid Function Tests: No results for input(s): "TSH", "T4TOTAL", "FREET4", "T3FREE", "THYROIDAB" in the last 72 hours. Anemia Panel: No results for input(s): "VITAMINB12", "FOLATE", "FERRITIN", "TIBC", "IRON", "RETICCTPCT" in the last 72 hours. Sepsis Labs: No results for input(s): "PROCALCITON", "LATICACIDVEN" in the last 168 hours.  Recent Results (from the past 240 hours)  Resp panel by RT-PCR (RSV, Flu A&B, Covid) Anterior Nasal Swab     Status: None   Collection Time: 09/18/23  1:06 PM   Specimen: Anterior Nasal Swab  Result Value Ref Range Status   SARS Coronavirus 2 by RT PCR NEGATIVE NEGATIVE Final   Influenza A by PCR NEGATIVE NEGATIVE Final   Influenza B by PCR NEGATIVE NEGATIVE Final    Comment: (NOTE) The Xpert Xpress SARS-CoV-2/FLU/RSV plus assay is intended as an aid in the diagnosis of influenza from Nasopharyngeal swab specimens and should not be used as a sole basis for treatment. Nasal washings and aspirates are unacceptable for Xpert Xpress SARS-CoV-2/FLU/RSV testing.  Fact Sheet for Patients: BloggerCourse.com  Fact Sheet for Healthcare Providers: SeriousBroker.it  This test is not yet approved or cleared by the [place] FDA and has been authorized for detection and/or diagnosis of SARS-CoV-2 by FDA under an Emergency Use Authorization (EUA). This EUA will remain in effect (meaning this test can be used) for the duration  of the COVID-19 declaration under Section 564(b)(1) of the Act, 21 U.S.C. section 360bbb-3(b)(1), unless the authorization is terminated or revoked.     Resp Syncytial Virus by PCR NEGATIVE NEGATIVE Final    Comment: (NOTE) Fact Sheet for Patients: BloggerCourse.com  Fact Sheet for Healthcare Providers: SeriousBroker.it  This test is not yet approved or cleared by the [place] FDA and has been authorized for detection and/or diagnosis of SARS-CoV-2 by FDA under an Emergency Use Authorization (EUA). This EUA will remain in effect (meaning this test can be used) for the duration of the COVID-19 declaration under Section 564(b)(1) of the Act, 21 U.S.C. section 360bbb-3(b)(1), unless the authorization is terminated or revoked.  Performed at [facility], [address] [place], [place] [address]          Radiology Studies: NM Hepatobiliary Liver Func Result Date: 09/18/2023 CLINICAL DATA:  Findings suspicious for acute cholecystitis, evaluate cystic duct patency EXAM: NUCLEAR MEDICINE HEPATOBILIARY IMAGING TECHNIQUE: Sequential images of the abdomen were obtained out to 60 minutes following intravenous administration of radiopharmaceutical. RADIOPHARMACEUTICALS:  Five mCi Tc-5m  Choletec IV COMPARISON:  None Available. FINDINGS: Normal uptake and excretion of biliary tracer is noted from the liver. Common bile duct is  within normal limits. Free flow into the duodenum is seen. No gallbladder activity is noted in the first hour. 3 mg morphine was then administered to try to stimulate cystic duct filling. Imaging was performed for 30 minutes and no cystic duct or gallbladder filling was noted. IMPRESSION: Cystic duct does not appear to be patent despite morphine challenge. These findings would be consistent with acute cholecystitis. Electronically Signed   By: Alcide Clever M.D.   On: 09/18/2023 19:15   US Abdomen Limited RUQ  (LIVER/GB) Result Date: 09/18/2023 CLINICAL DATA:  Right upper quadrant pain EXAM: ULTRASOUND ABDOMEN LIMITED RIGHT UPPER QUADRANT COMPARISON:  02/19/2020 FINDINGS: Gallbladder: Full gallbladder with internal sludge. No shadowing stone detected separate from the sludge. There is wall thickening to 4 mm and focal tenderness. Common bile duct: Diameter: 5 mm Liver: Echogenic liver diffusely, usually indicating hepatocellular disease such as steatosis. Portal vein is patent on color Doppler imaging with normal direction of blood flow towards the liver. IMPRESSION: Gallbladder sludge with distended and focally tender gallbladder that could be obstructed. Possible cholecystitis. Electronically Signed   By: Tiburcio Pea M.D.   On: 09/18/2023 06:22        Scheduled Meds:  arformoterol  15 mcg Nebulization BID   atorvastatin  80 mg Oral q morning   budesonide (PULMICORT) nebulizer solution  0.5 mg Nebulization BID   DULoxetine  60 mg Oral Daily   enoxaparin (LOVENOX) injection  40 mg Subcutaneous Q24H   ezetimibe  10 mg Oral Daily   folic acid  1 mg Oral Daily   gabapentin  300 mg Oral QHS   ipratropium-albuterol  3 mL Nebulization QID   LORazepam  0-4 mg Intravenous Q6H   Followed by   Melene Muller ON 09/20/2023] LORazepam  0-4 mg Intravenous Q12H   metoprolol succinate  50 mg Oral Daily   multivitamin with minerals  1 tablet Oral Daily   pantoprazole  40 mg Oral Daily   predniSONE  40 mg Oral Q breakfast   sodium chloride flush  3 mL Intravenous Q12H   thiamine  100 mg Oral Daily   Or   thiamine  100 mg Intravenous Daily   Continuous Infusions:  sodium chloride Stopped (09/19/23 1031)   cefTRIAXone (ROCEPHIN)  IV 2 g (09/19/23 1125)     LOS: 1 day    Time spent: 40 minutes    Dorcas Carrow, MD [facility]

## 2023-09-19 NOTE — Care Plan (Signed)
 Surgery Plan of Care  The patient is now s/p lap chole.  There was evidence of cholecystitis.  He tolerated the procedure well and there were no immediate complications.  - Okay for a diet - Continue holding Plavix - Repeat CBC in AM - Surgery will follow up  Melody Haver, MD   General Surgeon [facility], [place]

## 2023-09-19 NOTE — Op Note (Signed)
 Patient: Derrick Stokes MRN: [id number] DOB: 04-20-1951 Sex: male Operation/Procedure Date: 09/18/2023 - 09/19/2023 Surgeons and Role:    * Hillery Hunter Lucilla Edin, MD - Primary Pre-operative Diagnoses: Cholecystitis Postoperative Diagnoses: Cholecystitis  Procedure performed: Procedures:   * LAPAROSCOPIC CHOLECYSTECTOMY   * INDOCYANINE GREEN FLUORESCENCE IMAGING (ICG)  Anesthesia: General endotracheal anesthesia  Indications: Derrick Stokes is a 73 year old male who presented to the ED with abdominal pain.  Ultrasound showed gallstones, and based on history and physical exam he was diagnosed with acute cholecystitis. He was on Plavix for a history of CABG and DES so I decided to wait 48 hours before taking him to the operating room.  Preoperatively, I discussed in detail the risks, benefits, alternatives, and potential complications. The patient understands and requests to proceed.  Operative Findings: Significant inflammation consistent with acute cholecystitis.  Operative Narrative: The patient was positively identified and was taken to the operating room and placed supine on the operating table. A time-out was performed confirming correct patient and procedure. We also confirmed initiation of deep venous thrombosis prophylaxis and wound prophylaxis. After successful induction of general endotracheal anesthesia, the arms were carefully padded. An orogastric tube and footboard were placed. The abdomen was prepped and draped in the usual sterile surgical fashion.  We began our peritoneal access with a veress needle inserted at Palmer's point.  After aspiration showed return of air bubbles and there was a positive saline drop test, the insufflation was connected and the abdomen brought to a pressure of .  We then used an opti-view technique to place a 5mm port just to the right of midline, superior to the umbilicus.  A laparoscope was introduced into the abdomen, and there were no signs of injury  from entry.  A 12mm port was placed in the subxiphoid position.  Two additional 5mm ports were placed in the RUQ.  A 360-degree visualization with a 30-degree 5-mm laparoscope revealed grossly normal intra-abdominal contents.   The patient was placed in the head up position and tilted slightly to the left. The dome of the gallbladder was then grasped, elevated, and retracted anteriorly and cephalad.  The infundibulum was retracted laterally and inferiorly exposing Calot's triangle. The investing visceral peritoneal attachments overlying the infundibulum of the gallbladder were incised using the hook electrocautery and dissected free from the gallbladder itself. We soon developed two structures into the gallbladder consistent with the cystic duct and cystic artery. The loose areolar tissue around these structures was dissected free. The gallbladder was separated from the gallbladder fossa for approximately a third the distance up from the cystic plate, establishing the critical view of safety. We then transitioned to infrared viewing mode to allow for visualization of the ICG tracer. There was tracer throughout the liver. There was tracer within the candidate cystic duct as well as in a separate structure medially to the duct, consistent with where the common bile duct would be expected.  There was no tracer within the candidate cystic artery.  There was tracer within the duodenum, indicating no presence of a biliary obstruction. The cystic duct and cystic artery were triply clipped. These were divided using laparoscopic scissors leaving a single clip on the removal side. The gallbladder was elevated off the gallbladder fossa using the hook Bovie. The gallbladder was then exteriorized through the subxiphoid port site using an EndoCatch bag. We reestablished pneumoperitoneum and confirmed no leakage of bile. The lateral edge of the gallbladder fossa was oozing. This was controled with electrocautery  and a piece of  hemostatic agent was placed.  We confirmed integrity of our clips. The subhepatic space was irrigated with warm sterile saline and suctioned free. The 12mm port site was closed using an 0-vicryl on a suture passer.We placed 0.25% Marcaine with epinephrine at each incision site for local anesthesia. The skin was closed using 4-0 Monocryl subcuticular suture. Dermabond was applied. The patient tolerated the procedure well, was extubated, and taken to the recovery room.  Estimated Blood Loss: 50mL Specimens: Gallbladder Implants: None Drains: None Complications: None Condition of the patient: Good, extubated Disposition: PACU  Moise Boring Date: 09/19/2023 Time: 4:22 PM

## 2023-09-20 ENCOUNTER — Encounter (HOSPITAL_COMMUNITY): Payer: Self-pay | Admitting: General Surgery

## 2023-09-20 DIAGNOSIS — R1011 Right upper quadrant pain: Secondary | ICD-10-CM | POA: Diagnosis not present

## 2023-09-20 LAB — CBC WITH DIFFERENTIAL/PLATELET
Abs Immature Granulocytes: 0.09 10*3/uL — ABNORMAL HIGH (ref 0.00–0.07)
Basophils Absolute: 0 10*3/uL (ref 0.0–0.1)
Basophils Relative: 0 %
Eosinophils Absolute: 0 10*3/uL (ref 0.0–0.5)
Eosinophils Relative: 0 %
HCT: 36.2 % — ABNORMAL LOW (ref 39.0–52.0)
Hemoglobin: 12.6 g/dL — ABNORMAL LOW (ref 13.0–17.0)
Immature Granulocytes: 1 %
Lymphocytes Relative: 13 %
Lymphs Abs: 1.6 10*3/uL (ref 0.7–4.0)
MCH: 32.3 pg (ref 26.0–34.0)
MCHC: 34.8 g/dL (ref 30.0–36.0)
MCV: 92.8 fL (ref 80.0–100.0)
Monocytes Absolute: 1 10*3/uL (ref 0.1–1.0)
Monocytes Relative: 8 %
Neutro Abs: 10.4 10*3/uL — ABNORMAL HIGH (ref 1.7–7.7)
Neutrophils Relative %: 78 %
Platelets: 227 10*3/uL (ref 150–400)
RBC: 3.9 MIL/uL — ABNORMAL LOW (ref 4.22–5.81)
RDW: 13.1 % (ref 11.5–15.5)
WBC: 13.1 10*3/uL — ABNORMAL HIGH (ref 4.0–10.5)
nRBC: 0 % (ref 0.0–0.2)

## 2023-09-20 MED ORDER — OXYCODONE HCL 5 MG PO TABS: 5.0000 mg | ORAL_TABLET | Freq: Four times a day (QID) | ORAL | 0 refills | Status: AC | PRN

## 2023-09-20 MED ORDER — GUAIFENESIN ER 600 MG PO TB12
600.0000 mg | ORAL_TABLET | Freq: Two times a day (BID) | ORAL | Status: DC
  Administered 2023-09-20: 600 mg via ORAL
  Filled 2023-09-20: qty 1

## 2023-09-20 MED ORDER — OXYCODONE HCL 5 MG PO TABS: 5.0000 mg | ORAL_TABLET | ORAL | Status: DC | PRN

## 2023-09-20 MED ORDER — ACETAMINOPHEN 500 MG PO TABS: 1000.0000 mg | ORAL_TABLET | Freq: Three times a day (TID) | ORAL | Status: DC

## 2023-09-20 MED ORDER — GUAIFENESIN ER 600 MG PO TB12: 600.0000 mg | ORAL_TABLET | Freq: Two times a day (BID) | ORAL | 0 refills | Status: AC

## 2023-09-20 NOTE — Discharge Summary (Signed)
 Physician Discharge Summary  Derrick Stokes XBJ:[id number] DOB: [birth year] DOA: 09/18/2023  PCP: [place], [name], MD  Admit date: 09/18/2023 Discharge date: 09/20/2023  Admitted From: Home Disposition: Home  Recommendations for Outpatient Follow-up:  Follow up with PCP in 1-2 weeks Please obtain BMP/CBC in one week Surgery to schedule follow-up  Home Health: N/A Equipment/Devices: N/A  Discharge Condition: Stable CODE STATUS: Full code Diet recommendation: Low-salt diet  Discharge summary: 73 year old hypertension, hyperlipidemia, coronary artery disease status post CABG in 2012, DES left circumflex 2016, history of COPD, ongoing smoker and ongoing alcohol use presented with right upper quadrant abdominal pain of 1 day and found to have acute cholecystitis on the basis of ultrasound and confirmed by HIDA scan.  Patient on Plavix with last dose taken 3/1 morning.  Drinks about 6 pack of beer a day, he could only drink 2 beers in the morning of presentation.  Patient was admitted to the hospital.  He was treated with IV fluids, IV antibiotics and pain medications.  Patient ultimately underwent laparoscopic cholecystectomy on 3/2.  Postop day 1 today.  Adequate bowel function.  On room air and mobilizing around.  Discharging with as needed pain medications as per surgery.  COPD with exacerbation: Ongoing smoker. Chronic wheezing and smoker.  Reported baseline wheezing.  On bronchodilators at home.  Will not use any antibiotics or steroids at this time. Counseled against smoking.  Patient is motivated but not sure he will be able to quit.  Recommended to continue giving effort.  Does not want a nicotine patch.   Coronary artery disease status post CABG, hypertension hyperlipidemia: Currently chest pain-free.  On long-term Plavix.  Last dose 3/1.  Continue statin. Patient to go back on Plavix 3/4 morning.   Alcoholism: Counseled.  Was on CIWA protocol.  So far uncomplicated.  Patient did not  develop any alcohol withdrawal syndrome while in the hospital.   GERD: On Protonix.  He stable for discharge.  Surgery to schedule outpatient follow-up.  Discharge Diagnoses:  Principal Problem:   Right upper quadrant abdominal pain Active Problems:   Acute cholecystitis   COPD exacerbation (HCC)   Essential (primary) hypertension   Hyperlipidemia   Coronary artery disease involving native coronary artery of native heart with angina pectoris (HCC)   Alcohol abuse   Tobacco abuse   HIATAL HERNIA   GERD (gastroesophageal reflux disease)    Discharge Instructions  Discharge Instructions     Diet - low sodium heart healthy   Complete by: As directed    Increase activity slowly   Complete by: As directed       Allergies as of 09/20/2023       Reactions   Iodine Nausea And Vomiting, Other (See Comments)   Diaphoresis  Pallid complexion   Iohexol Nausea And Vomiting   Shrimp [shellfish Allergy] Nausea And Vomiting, Other (See Comments)   Diaphoresis Pallid complexion   Isordil Titradose [isosorbide Dinitrate] Other (See Comments)   Headache         Medication List     PAUSE taking these medications    clopidogrel 75 MG tablet Wait to take this until: September 21, 2023 Commonly known as: PLAVIX TAKE 1 TABLET BY MOUTH EVERY MORNING       TAKE these medications    albuterol 108 (90 Base) MCG/ACT inhaler Commonly known as: VENTOLIN HFA INHALE 1-2 PUFFS BY MOUTH INTO THE LUNGS EVERY 6 HOURS AS NEEDED FOR WHEEZING AND/OR FOR SHORTNESS OF BREATH   atorvastatin  80 MG tablet Commonly known as: LIPITOR TAKE 1 TABLET (80 MG TOTAL) BY MOUTH EVERY MORNING.   DULoxetine 60 MG capsule Commonly known as: CYMBALTA TAKE 1 CAPSULE BY MOUTH ONCE DAILY   ezetimibe 10 MG tablet Commonly known as: ZETIA TAKE 1 TABLET BY MOUTH DAILY   gabapentin 300 MG capsule Commonly known as: NEURONTIN TAKE 1 CAPSULE BY MOUTH AT BEDTIME   GOODY HEADACHE PO Take 1-2 packets by mouth  daily as needed (pain, headache).   guaiFENesin 600 MG 12 hr tablet Commonly known as: MUCINEX Take 1 tablet (600 mg total) by mouth 2 (two) times daily for 14 days.   metoprolol succinate 50 MG 24 hr tablet Commonly known as: TOPROL-XL TAKE 1 TABLET BY MOUTH ONCE DAILY WITH A MEAL   nitroGLYCERIN 0.4 MG SL tablet Commonly known as: NITROSTAT Place 1 tablet (0.4 mg total) under the tongue every 5 (five) minutes as needed for chest pain. PLACE 1 TABLET UNDER THE TONGUE EVERY 5 MINUTES X 3 DOSES AS NEEDED FOR CHEST PAIN.   oxyCODONE 5 MG immediate release tablet Commonly known as: Oxy IR/ROXICODONE Take 1 tablet (5 mg total) by mouth every 6 (six) hours as needed for up to 3 days for moderate pain (pain score 4-6) or severe pain (pain score 7-10).   pantoprazole 40 MG tablet Commonly known as: PROTONIX TAKE 1 TABLET BY MOUTH DAILY        Follow-up Information     Maczis, Hedda Slade, PA-C Follow up.   Specialty: General Surgery Why: Call to confirm your appointment date and time, bring a copy of your photo ID and insurance card, arrive 30 minutes prior to your appointment Contact information: [address] [place] [address] [phone]                Allergies  Allergen Reactions   Iodine Nausea And Vomiting and Other (See Comments)    Diaphoresis  Pallid complexion   Iohexol Nausea And Vomiting   Shrimp [Shellfish Allergy] Nausea And Vomiting and Other (See Comments)    Diaphoresis Pallid complexion   Isordil Titradose [Isosorbide Dinitrate] Other (See Comments)    Headache     Consultations: General Surgery   Procedures/Studies: NM Hepatobiliary Liver Func Result Date: 09/18/2023 CLINICAL DATA:  Findings suspicious for acute cholecystitis, evaluate cystic duct patency EXAM: NUCLEAR MEDICINE HEPATOBILIARY IMAGING TECHNIQUE: Sequential images of the abdomen were obtained out to 60 minutes following intravenous administration of  radiopharmaceutical. RADIOPHARMACEUTICALS:  Five mCi Tc-17m  Choletec IV COMPARISON:  None Available. FINDINGS: Normal uptake and excretion of biliary tracer is noted from the liver. Common bile duct is within normal limits. Free flow into the duodenum is seen. No gallbladder activity is noted in the first hour. 3 mg morphine was then administered to try to stimulate cystic duct filling. Imaging was performed for 30 minutes and no cystic duct or gallbladder filling was noted. IMPRESSION: Cystic duct does not appear to be patent despite morphine challenge. These findings would be consistent with acute cholecystitis. Electronically Signed   By: Alcide Clever M.D.   On: 09/18/2023 19:15   US Abdomen Limited RUQ (LIVER/GB) Result Date: 09/18/2023 CLINICAL DATA:  Right upper quadrant pain EXAM: ULTRASOUND ABDOMEN LIMITED RIGHT UPPER QUADRANT COMPARISON:  02/19/2020 FINDINGS: Gallbladder: Full gallbladder with internal sludge. No shadowing stone detected separate from the sludge. There is wall thickening to 4 mm and focal tenderness. Common bile duct: Diameter: 5 mm Liver: Echogenic liver diffusely, usually indicating  hepatocellular disease such as steatosis. Portal vein is patent on color Doppler imaging with normal direction of blood flow towards the liver. IMPRESSION: Gallbladder sludge with distended and focally tender gallbladder that could be obstructed. Possible cholecystitis. Electronically Signed   By: Tiburcio Pea M.D.   On: 09/18/2023 06:22   (Echo, Carotid, EGD, Colonoscopy, ERCP)    Subjective: Patient seen in the morning rounds.  Walking around in the hallway.  Son at the bedside.  He has audible wheezing and patient and his son stated that this is his baseline.  On room air.  Mild shortness.  Passing flatus.  Tolerating regular diet.   Discharge Exam: Vitals:   09/20/23 0850 09/20/23 1148  BP: (!) 105/58   Pulse: 68   Resp: 18   Temp: 97.7 F (36.5 C)   SpO2: 97% 94%   Vitals:    09/20/23 0747 09/20/23 0749 09/20/23 0850 09/20/23 1148  BP:   (!) 105/58   Pulse:   68   Resp:   18   Temp:   97.7 F (36.5 C)   TempSrc:   Oral   SpO2: 95% 96% 97% 94%  Weight:      Height:        General: Pt is alert, awake, not in acute distress.  On room air.  Walking around. Cardiovascular: RRR, S1/S2 +, no rubs, no gallops Respiratory: CTA bilaterally, upper airway sounds. Abdominal: Soft, mildly tender along the ports.  ND, bowel sounds + Extremities: no edema, no cyanosis    The results of significant diagnostics from this hospitalization (including imaging, microbiology, ancillary and laboratory) are listed below for reference.     Microbiology: Recent Results (from the past 240 hours)  Resp panel by RT-PCR (RSV, Flu A&B, Covid) Anterior Nasal Swab     Status: None   Collection Time: 09/18/23  1:06 PM   Specimen: Anterior Nasal Swab  Result Value Ref Range Status   SARS Coronavirus 2 by RT PCR NEGATIVE NEGATIVE Final   Influenza A by PCR NEGATIVE NEGATIVE Final   Influenza B by PCR NEGATIVE NEGATIVE Final    Comment: (NOTE) The Xpert Xpress SARS-CoV-2/FLU/RSV plus assay is intended as an aid in the diagnosis of influenza from Nasopharyngeal swab specimens and should not be used as a sole basis for treatment. Nasal washings and aspirates are unacceptable for Xpert Xpress SARS-CoV-2/FLU/RSV testing.  Fact Sheet for Patients: BloggerCourse.com  Fact Sheet for Healthcare Providers: SeriousBroker.it  This test is not yet approved or cleared by the [place] FDA and has been authorized for detection and/or diagnosis of SARS-CoV-2 by FDA under an Emergency Use Authorization (EUA). This EUA will remain in effect (meaning this test can be used) for the duration of the COVID-19 declaration under Section 564(b)(1) of the Act, 21 U.S.C. section 360bbb-3(b)(1), unless the authorization is terminated or revoked.      Resp Syncytial Virus by PCR NEGATIVE NEGATIVE Final    Comment: (NOTE) Fact Sheet for Patients: BloggerCourse.com  Fact Sheet for Healthcare Providers: SeriousBroker.it  This test is not yet approved or cleared by the [place] FDA and has been authorized for detection and/or diagnosis of SARS-CoV-2 by FDA under an Emergency Use Authorization (EUA). This EUA will remain in effect (meaning this test can be used) for the duration of the COVID-19 declaration under Section 564(b)(1) of the Act, 21 U.S.C. section 360bbb-3(b)(1), unless the authorization is terminated or revoked.  Performed at [facility], [address], [place], [place]  [address]      Labs: BNP (last 3 results) No results for input(s): "BNP" in the last 8760 hours. Basic Metabolic Panel: Recent Labs  Lab 09/18/23 0320 09/19/23 0652  NA 134* 137  K 4.1 4.1  CL 100 99  CO2 26 21*  GLUCOSE 136* 157*  BUN 12 15  CREATININE 0.76 0.75  CALCIUM 9.5 9.5  MG  --  1.9  PHOS  --  3.7   Liver Function Tests: Recent Labs  Lab 09/18/23 0320 09/19/23 0652  AST 27 23  ALT 26 25  ALKPHOS 106 92  BILITOT 1.0 1.0  PROT 7.3 6.7  ALBUMIN 3.7 3.1*   Recent Labs  Lab 09/18/23 0320  LIPASE 24   No results for input(s): "AMMONIA" in the last 168 hours. CBC: Recent Labs  Lab 09/18/23 0237 09/19/23 0652 09/20/23 0845  WBC 8.8 6.5 13.1*  NEUTROABS  --   --  10.4*  HGB 14.6 13.3 12.6*  HCT 42.0 38.7* 36.2*  MCV 92.3 91.9 92.8  PLT 204 205 227   Cardiac Enzymes: No results for input(s): "CKTOTAL", "CKMB", "CKMBINDEX", "TROPONINI" in the last 168 hours. BNP: Invalid input(s): "POCBNP" CBG: No results for input(s): "GLUCAP" in the last 168 hours. D-Dimer No results for input(s): "DDIMER" in the last 72 hours. Hgb A1c No results for input(s): "HGBA1C" in the last 72 hours. Lipid Profile No results for input(s): "CHOL", "HDL", "LDLCALC",  "TRIG", "CHOLHDL", "LDLDIRECT" in the last 72 hours. Thyroid function studies No results for input(s): "TSH", "T4TOTAL", "T3FREE", "THYROIDAB" in the last 72 hours.  Invalid input(s): "FREET3" Anemia work up No results for input(s): "VITAMINB12", "FOLATE", "FERRITIN", "TIBC", "IRON", "RETICCTPCT" in the last 72 hours. Urinalysis    Component Value Date/Time   COLORURINE AMBER (A) 09/18/2023 0230   APPEARANCEUR CLEAR 09/18/2023 0230   LABSPEC 1.014 09/18/2023 0230   PHURINE 6.0 09/18/2023 0230   GLUCOSEU NEGATIVE 09/18/2023 0230   HGBUR NEGATIVE 09/18/2023 0230   BILIRUBINUR NEGATIVE 09/18/2023 0230   KETONESUR NEGATIVE 09/18/2023 0230   PROTEINUR NEGATIVE 09/18/2023 0230   UROBILINOGEN 1.0 01/16/2012 0731   NITRITE NEGATIVE 09/18/2023 0230   LEUKOCYTESUR NEGATIVE 09/18/2023 0230   Sepsis Labs Recent Labs  Lab 09/18/23 0237 09/19/23 0652 09/20/23 0845  WBC 8.8 6.5 13.1*   Microbiology Recent Results (from the past 240 hours)  Resp panel by RT-PCR (RSV, Flu A&B, Covid) Anterior Nasal Swab     Status: None   Collection Time: 09/18/23  1:06 PM   Specimen: Anterior Nasal Swab  Result Value Ref Range Status   SARS Coronavirus 2 by RT PCR NEGATIVE NEGATIVE Final   Influenza A by PCR NEGATIVE NEGATIVE Final   Influenza B by PCR NEGATIVE NEGATIVE Final    Comment: (NOTE) The Xpert Xpress SARS-CoV-2/FLU/RSV plus assay is intended as an aid in the diagnosis of influenza from Nasopharyngeal swab specimens and should not be used as a sole basis for treatment. Nasal washings and aspirates are unacceptable for Xpert Xpress SARS-CoV-2/FLU/RSV testing.  Fact Sheet for Patients: BloggerCourse.com  Fact Sheet for Healthcare Providers: SeriousBroker.it  This test is not yet approved or cleared by the [place] FDA and has been authorized for detection and/or diagnosis of SARS-CoV-2 by FDA under an Emergency Use Authorization  (EUA). This EUA will remain in effect (meaning this test can be used) for the duration of the COVID-19 declaration under Section 564(b)(1) of the Act, 21 U.S.C. section 360bbb-3(b)(1), unless the authorization is terminated or revoked.  Resp Syncytial Virus by PCR NEGATIVE NEGATIVE Final    Comment: (NOTE) Fact Sheet for Patients: BloggerCourse.com  Fact Sheet for Healthcare Providers: SeriousBroker.it  This test is not yet approved or cleared by the [place] FDA and has been authorized for detection and/or diagnosis of SARS-CoV-2 by FDA under an Emergency Use Authorization (EUA). This EUA will remain in effect (meaning this test can be used) for the duration of the COVID-19 declaration under Section 564(b)(1) of the Act, 21 U.S.C. section 360bbb-3(b)(1), unless the authorization is terminated or revoked.  Performed at [facility], [address] [place], [place] [address]      Time coordinating discharge: 35 minutes  SIGNED:   Dorcas Carrow, MD  [facility] 09/20/2023, 2:12 PM

## 2023-09-20 NOTE — Progress Notes (Signed)
 1 Day Post-Op  Subjective: CC: Patient son at bedside.  Patient reports RUQ abdominal pain when coughing. Sore around his incisions at rest. Still requiring IV pain medication. Has not been oob. Tolerating reg diet without n/v. No BM. Voiding.   On o2 this am, 2L. Does not wear o2 at baseline. +tobacco use. Hx of COPD on inhalers. Reports dry, non-productive cough. No sob. No IS in the room.   Afebrile. No tachycardia or systolic hypotension. Soft BP improved. On 50mg  of Metoprolol XL and received this am around 8am. CBC pending.   Objective: Vital signs in last 24 hours: Temp:  [97.5 F (36.4 C)-98.7 F (37.1 C)] 97.7 F (36.5 C) (03/03 0850) Pulse Rate:  [65-83] 68 (03/03 0850) Resp:  [14-24] 18 (03/03 0850) BP: (97-120)/(58-70) 105/58 (03/03 0850) SpO2:  [91 %-98 %] 97 % (03/03 0850) Weight:  [78.9 kg] 78.9 kg (03/02 1407)    Intake/Output from previous day: 03/02 0701 - 03/03 0700 In: 1211.9 [P.O.:240; I.V.:871.9; IV Piggyback:100] Out: 25 [Blood:25] Intake/Output this shift: No intake/output data recorded.  PE: Gen:  Alert, NAD, pleasant Card:  reg Pulm:  Rate and effort normal. Rhonchi with faint wheezing b/l. On 2L o2.  Abd: Soft, mild distension, appropriately tender around laparoscopic incisions, no rigidity or guarding and otherwise NT, +BS. Incisions with glue intact appears well and are without drainage, bleeding, or signs of infection  Ext:  No LE edema   Lab Results:  Recent Labs    09/18/23 0237 09/19/23 0652  WBC 8.8 6.5  HGB 14.6 13.3  HCT 42.0 38.7*  PLT 204 205   BMET Recent Labs    09/18/23 0320 09/19/23 0652  NA 134* 137  K 4.1 4.1  CL 100 99  CO2 26 21*  GLUCOSE 136* 157*  BUN 12 15  CREATININE 0.76 0.75  CALCIUM 9.5 9.5   PT/INR Recent Labs    09/19/23 0940  LABPROT 14.7  INR 1.1   CMP     Component Value Date/Time   NA 137 09/19/2023 0652   NA 136 10/07/2020 0930   K 4.1 09/19/2023 0652   CL 99 09/19/2023 0652    CO2 21 (L) 09/19/2023 0652   GLUCOSE 157 (H) 09/19/2023 0652   BUN 15 09/19/2023 0652   BUN 12 10/07/2020 0930   CREATININE 0.75 09/19/2023 0652   CREATININE 0.84 02/05/2020 1417   CALCIUM 9.5 09/19/2023 0652   PROT 6.7 09/19/2023 0652   PROT 7.0 03/08/2019 0905   ALBUMIN 3.1 (L) 09/19/2023 0652   ALBUMIN 4.3 03/08/2019 0905   AST 23 09/19/2023 0652   ALT 25 09/19/2023 0652   ALKPHOS 92 09/19/2023 0652   BILITOT 1.0 09/19/2023 0652   BILITOT 0.9 03/08/2019 0905   GFRNONAA >60 09/19/2023 0652   GFRAA 109 03/08/2019 0905   Lipase     Component Value Date/Time   LIPASE 24 09/18/2023 0320    Studies/Results: NM Hepatobiliary Liver Func Result Date: 09/18/2023 CLINICAL DATA:  Findings suspicious for acute cholecystitis, evaluate cystic duct patency EXAM: NUCLEAR MEDICINE HEPATOBILIARY IMAGING TECHNIQUE: Sequential images of the abdomen were obtained out to 60 minutes following intravenous administration of radiopharmaceutical. RADIOPHARMACEUTICALS:  Five mCi Tc-101m  Choletec IV COMPARISON:  None Available. FINDINGS: Normal uptake and excretion of biliary tracer is noted from the liver. Common bile duct is within normal limits. Free flow into the duodenum is seen. No gallbladder activity is noted in the first hour. 3 mg morphine was then administered to  try to stimulate cystic duct filling. Imaging was performed for 30 minutes and no cystic duct or gallbladder filling was noted. IMPRESSION: Cystic duct does not appear to be patent despite morphine challenge. These findings would be consistent with acute cholecystitis. Electronically Signed   By: Alcide Clever M.D.   On: 09/18/2023 19:15    Anti-infectives: Anti-infectives (From admission, onward)    Start     Dose/Rate Route Frequency Ordered Stop   09/19/23 1200  cefTRIAXone (ROCEPHIN) 2 g in sodium chloride 0.9 % 100 mL IVPB        2 g 200 mL/hr over 30 Minutes Intravenous Every 24 hours 09/18/23 1150     09/18/23 0715  cefTRIAXone  (ROCEPHIN) 2 g in sodium chloride 0.9 % 100 mL IVPB        2 g 200 mL/hr over 30 Minutes Intravenous  Once 09/18/23 5621 09/18/23 0859        Assessment/Plan POD 1 s/p Laparoscopic Cholecystectomy by Dr. Hillery Hunter for Acute Cholecystitis on 09/19/23 - Add PO pain option, wean IV pain medications - CBC pending - Pulm toilet, IS/FV, wean o2 - Mobilize  FEN - Reg VTE - SCDs, okay for chem ppx from a general surgery standpoint. Hold plavix - await cbc/hgb - would not restart until hgb stable and 48 hours from surgery ID - Rocephin - does not need any further abx from our standpoint  - Per TRH -  COPD - on 2L this am. Mucniex. On scheduled nebs. IS/FV.  CAD s/p CABG - hold plavix. Hgb pending Etoh - CIWA HTN HLD GERD    LOS: 1 day    Jacinto Halim, [facility] 09/20/2023, 9:21 AM Please see Amion for pager number during day hours 7:00am-4:30pm

## 2023-09-20 NOTE — Care Management Obs Status (Signed)
 MEDICARE OBSERVATION STATUS NOTIFICATION   Patient Details  Name: Derrick Stokes MRN: [id number] Date of Birth: 1950-07-31   Medicare Observation Status Notification Given:  Yes    Kingsley Plan, RN 09/20/2023, 1:54 PM

## 2023-09-21 ENCOUNTER — Other Ambulatory Visit: Payer: Self-pay

## 2023-09-21 LAB — SURGICAL PATHOLOGY

## 2023-10-19 ENCOUNTER — Other Ambulatory Visit: Payer: Self-pay | Admitting: Family Medicine

## 2023-10-19 DIAGNOSIS — G8929 Other chronic pain: Secondary | ICD-10-CM

## 2023-12-01 ENCOUNTER — Inpatient Hospital Stay (HOSPITAL_COMMUNITY)
Admission: EM | Admit: 2023-12-01 | Discharge: 2023-12-06 | DRG: 183 | Disposition: A | Discharge status: Home / Self Care | Attending: General Surgery | Admitting: General Surgery

## 2023-12-01 ENCOUNTER — Encounter (HOSPITAL_COMMUNITY): Payer: Self-pay

## 2023-12-01 ENCOUNTER — Other Ambulatory Visit: Payer: Self-pay

## 2023-12-01 ENCOUNTER — Emergency Department (HOSPITAL_COMMUNITY)

## 2023-12-01 DIAGNOSIS — S199XXA Unspecified injury of neck, initial encounter: Secondary | ICD-10-CM | POA: Diagnosis not present

## 2023-12-01 DIAGNOSIS — S2242XA Multiple fractures of ribs, left side, initial encounter for closed fracture: Principal | ICD-10-CM | POA: Diagnosis present

## 2023-12-01 DIAGNOSIS — Z7902 Long term (current) use of antithrombotics/antiplatelets: Secondary | ICD-10-CM | POA: Diagnosis not present

## 2023-12-01 DIAGNOSIS — W1839XA Other fall on same level, initial encounter: Secondary | ICD-10-CM | POA: Diagnosis not present

## 2023-12-01 DIAGNOSIS — J90 Pleural effusion, not elsewhere classified: Secondary | ICD-10-CM | POA: Diagnosis not present

## 2023-12-01 DIAGNOSIS — F17210 Nicotine dependence, cigarettes, uncomplicated: Secondary | ICD-10-CM | POA: Diagnosis not present

## 2023-12-01 DIAGNOSIS — F1010 Alcohol abuse, uncomplicated: Secondary | ICD-10-CM | POA: Diagnosis not present

## 2023-12-01 DIAGNOSIS — I700 Atherosclerosis of aorta: Secondary | ICD-10-CM | POA: Diagnosis present

## 2023-12-01 DIAGNOSIS — S22009A Unspecified fracture of unspecified thoracic vertebra, initial encounter for closed fracture: Secondary | ICD-10-CM | POA: Diagnosis present

## 2023-12-01 DIAGNOSIS — Z7982 Long term (current) use of aspirin: Secondary | ICD-10-CM

## 2023-12-01 DIAGNOSIS — F32A Depression, unspecified: Secondary | ICD-10-CM | POA: Diagnosis present

## 2023-12-01 DIAGNOSIS — F419 Anxiety disorder, unspecified: Secondary | ICD-10-CM | POA: Diagnosis present

## 2023-12-01 DIAGNOSIS — E785 Hyperlipidemia, unspecified: Secondary | ICD-10-CM | POA: Diagnosis present

## 2023-12-01 DIAGNOSIS — Z91013 Allergy to seafood: Secondary | ICD-10-CM | POA: Diagnosis not present

## 2023-12-01 DIAGNOSIS — Z79899 Other long term (current) drug therapy: Secondary | ICD-10-CM

## 2023-12-01 DIAGNOSIS — S061XAA Traumatic cerebral edema with loss of consciousness status unknown, initial encounter: Secondary | ICD-10-CM | POA: Diagnosis not present

## 2023-12-01 DIAGNOSIS — E8770 Fluid overload, unspecified: Secondary | ICD-10-CM | POA: Diagnosis not present

## 2023-12-01 DIAGNOSIS — J9601 Acute respiratory failure with hypoxia: Secondary | ICD-10-CM | POA: Diagnosis not present

## 2023-12-01 DIAGNOSIS — S06350A Traumatic hemorrhage of left cerebrum without loss of consciousness, initial encounter: Secondary | ICD-10-CM | POA: Diagnosis not present

## 2023-12-01 DIAGNOSIS — Z9861 Coronary angioplasty status: Secondary | ICD-10-CM | POA: Diagnosis not present

## 2023-12-01 DIAGNOSIS — R079 Chest pain, unspecified: Secondary | ICD-10-CM | POA: Diagnosis not present

## 2023-12-01 DIAGNOSIS — M4854XA Collapsed vertebra, not elsewhere classified, thoracic region, initial encounter for fracture: Secondary | ICD-10-CM | POA: Diagnosis present

## 2023-12-01 DIAGNOSIS — R55 Syncope and collapse: Secondary | ICD-10-CM | POA: Diagnosis not present

## 2023-12-01 DIAGNOSIS — R000 Tachycardia, unspecified: Secondary | ICD-10-CM | POA: Diagnosis not present

## 2023-12-01 DIAGNOSIS — S0636AA Traumatic hemorrhage of cerebrum, unspecified, with loss of consciousness status unknown, initial encounter: Principal | ICD-10-CM | POA: Diagnosis present

## 2023-12-01 DIAGNOSIS — K219 Gastro-esophageal reflux disease without esophagitis: Secondary | ICD-10-CM | POA: Diagnosis present

## 2023-12-01 DIAGNOSIS — Z8249 Family history of ischemic heart disease and other diseases of the circulatory system: Secondary | ICD-10-CM | POA: Diagnosis not present

## 2023-12-01 DIAGNOSIS — S06330A Contusion and laceration of cerebrum, unspecified, without loss of consciousness, initial encounter: Secondary | ICD-10-CM | POA: Diagnosis not present

## 2023-12-01 DIAGNOSIS — I2510 Atherosclerotic heart disease of native coronary artery without angina pectoris: Secondary | ICD-10-CM | POA: Diagnosis not present

## 2023-12-01 DIAGNOSIS — G8929 Other chronic pain: Secondary | ICD-10-CM

## 2023-12-01 DIAGNOSIS — R402363 Coma scale, best motor response, obeys commands, at hospital admission: Secondary | ICD-10-CM | POA: Diagnosis not present

## 2023-12-01 DIAGNOSIS — S270XXA Traumatic pneumothorax, initial encounter: Secondary | ICD-10-CM | POA: Diagnosis not present

## 2023-12-01 DIAGNOSIS — J9811 Atelectasis: Secondary | ICD-10-CM | POA: Diagnosis not present

## 2023-12-01 DIAGNOSIS — S299XXA Unspecified injury of thorax, initial encounter: Secondary | ICD-10-CM | POA: Diagnosis not present

## 2023-12-01 DIAGNOSIS — Z1152 Encounter for screening for COVID-19: Secondary | ICD-10-CM

## 2023-12-01 DIAGNOSIS — Z91041 Radiographic dye allergy status: Secondary | ICD-10-CM

## 2023-12-01 DIAGNOSIS — Z634 Disappearance and death of family member: Secondary | ICD-10-CM

## 2023-12-01 DIAGNOSIS — R402253 Coma scale, best verbal response, oriented, at hospital admission: Secondary | ICD-10-CM | POA: Diagnosis present

## 2023-12-01 DIAGNOSIS — R402143 Coma scale, eyes open, spontaneous, at hospital admission: Secondary | ICD-10-CM | POA: Diagnosis present

## 2023-12-01 DIAGNOSIS — J441 Chronic obstructive pulmonary disease with (acute) exacerbation: Secondary | ICD-10-CM | POA: Diagnosis present

## 2023-12-01 DIAGNOSIS — M1990 Unspecified osteoarthritis, unspecified site: Secondary | ICD-10-CM | POA: Diagnosis present

## 2023-12-01 DIAGNOSIS — I10 Essential (primary) hypertension: Secondary | ICD-10-CM | POA: Diagnosis not present

## 2023-12-01 DIAGNOSIS — S0633AA Contusion and laceration of cerebrum, unspecified, with loss of consciousness status unknown, initial encounter: Secondary | ICD-10-CM | POA: Diagnosis not present

## 2023-12-01 DIAGNOSIS — G319 Degenerative disease of nervous system, unspecified: Secondary | ICD-10-CM | POA: Diagnosis not present

## 2023-12-01 DIAGNOSIS — I6782 Cerebral ischemia: Secondary | ICD-10-CM | POA: Diagnosis not present

## 2023-12-01 DIAGNOSIS — S06329A Contusion and laceration of left cerebrum with loss of consciousness of unspecified duration, initial encounter: Secondary | ICD-10-CM | POA: Diagnosis present

## 2023-12-01 DIAGNOSIS — G629 Polyneuropathy, unspecified: Secondary | ICD-10-CM | POA: Diagnosis present

## 2023-12-01 DIAGNOSIS — Z87442 Personal history of urinary calculi: Secondary | ICD-10-CM | POA: Diagnosis not present

## 2023-12-01 DIAGNOSIS — R0789 Other chest pain: Secondary | ICD-10-CM | POA: Diagnosis not present

## 2023-12-01 DIAGNOSIS — M8580 Other specified disorders of bone density and structure, unspecified site: Secondary | ICD-10-CM | POA: Diagnosis not present

## 2023-12-01 DIAGNOSIS — I619 Nontraumatic intracerebral hemorrhage, unspecified: Secondary | ICD-10-CM

## 2023-12-01 DIAGNOSIS — I6523 Occlusion and stenosis of bilateral carotid arteries: Secondary | ICD-10-CM | POA: Diagnosis not present

## 2023-12-01 DIAGNOSIS — I951 Orthostatic hypotension: Secondary | ICD-10-CM | POA: Diagnosis not present

## 2023-12-01 DIAGNOSIS — S06369A Traumatic hemorrhage of cerebrum, unspecified, with loss of consciousness of unspecified duration, initial encounter: Secondary | ICD-10-CM | POA: Diagnosis not present

## 2023-12-01 DIAGNOSIS — Z951 Presence of aortocoronary bypass graft: Secondary | ICD-10-CM

## 2023-12-01 DIAGNOSIS — S22010A Wedge compression fracture of first thoracic vertebra, initial encounter for closed fracture: Secondary | ICD-10-CM | POA: Diagnosis not present

## 2023-12-01 DIAGNOSIS — Z888 Allergy status to other drugs, medicaments and biological substances status: Secondary | ICD-10-CM

## 2023-12-01 DIAGNOSIS — R0989 Other specified symptoms and signs involving the circulatory and respiratory systems: Secondary | ICD-10-CM | POA: Diagnosis not present

## 2023-12-01 DIAGNOSIS — M47814 Spondylosis without myelopathy or radiculopathy, thoracic region: Secondary | ICD-10-CM | POA: Diagnosis not present

## 2023-12-01 LAB — COMPREHENSIVE METABOLIC PANEL WITH GFR
ALT: 30 U/L (ref 0–44)
AST: 33 U/L (ref 15–41)
Albumin: 4.3 g/dL (ref 3.5–5.0)
Alkaline Phosphatase: 113 U/L (ref 38–126)
Anion gap: 11 (ref 5–15)
BUN: 10 mg/dL (ref 8–23)
CO2: 24 mmol/L (ref 22–32)
Calcium: 9.5 mg/dL (ref 8.9–10.3)
Chloride: 100 mmol/L (ref 98–111)
Creatinine, Ser: 0.48 mg/dL — ABNORMAL LOW (ref 0.61–1.24)
GFR, Estimated: >60 mL/min (ref >60)
Glucose, Bld: 104 mg/dL — ABNORMAL HIGH (ref 70–99)
Potassium: 4 mmol/L (ref 3.5–5.1)
Sodium: 135 mmol/L (ref 135–145)
Total Bilirubin: 1.8 mg/dL — ABNORMAL HIGH (ref 0.0–1.2)
Total Protein: 8.2 g/dL — ABNORMAL HIGH (ref 6.5–8.1)

## 2023-12-01 LAB — CBC WITH DIFFERENTIAL/PLATELET
Abs Immature Granulocytes: 0.05 10*3/uL (ref 0.00–0.07)
Basophils Absolute: 0 10*3/uL (ref 0.0–0.1)
Basophils Relative: 0 %
Eosinophils Absolute: 0 10*3/uL (ref 0.0–0.5)
Eosinophils Relative: 0 %
HCT: 41.9 % (ref 39.0–52.0)
Hemoglobin: 14.5 g/dL (ref 13.0–17.0)
Immature Granulocytes: 1 %
Lymphocytes Relative: 13 %
Lymphs Abs: 1.4 10*3/uL (ref 0.7–4.0)
MCH: 32.3 pg (ref 26.0–34.0)
MCHC: 34.6 g/dL (ref 30.0–36.0)
MCV: 93.3 fL (ref 80.0–100.0)
Monocytes Absolute: 0.9 10*3/uL (ref 0.1–1.0)
Monocytes Relative: 9 %
Neutro Abs: 8.3 10*3/uL — ABNORMAL HIGH (ref 1.7–7.7)
Neutrophils Relative %: 77 %
Platelets: 207 10*3/uL (ref 150–400)
RBC: 4.49 MIL/uL (ref 4.22–5.81)
RDW: 13.5 % (ref 11.5–15.5)
WBC: 10.8 10*3/uL — ABNORMAL HIGH (ref 4.0–10.5)
nRBC: 0 % (ref 0.0–0.2)

## 2023-12-01 LAB — RESP PANEL BY RT-PCR (RSV, FLU A&B, COVID)  RVPGX2
Influenza A by PCR: NEGATIVE
Influenza B by PCR: NEGATIVE
Resp Syncytial Virus by PCR: NEGATIVE
SARS Coronavirus 2 by RT PCR: NEGATIVE

## 2023-12-01 LAB — TROPONIN I (HIGH SENSITIVITY)
Troponin I (High Sensitivity): 3 ng/L (ref <18)
Troponin I (High Sensitivity): 3 ng/L (ref <18)

## 2023-12-01 MED ORDER — PANTOPRAZOLE SODIUM 40 MG PO TBEC
40.0000 mg | DELAYED_RELEASE_TABLET | Freq: Every day | ORAL | Status: DC
  Administered 2023-12-02 – 2023-12-06 (×5): 40 mg via ORAL
  Filled 2023-12-01 (×5): qty 1

## 2023-12-01 MED ORDER — DULOXETINE HCL 60 MG PO CPEP
60.0000 mg | ORAL_CAPSULE | Freq: Every day | ORAL | Status: DC
  Administered 2023-12-02 – 2023-12-06 (×5): 60 mg via ORAL
  Filled 2023-12-01 (×5): qty 1

## 2023-12-01 MED ORDER — ONDANSETRON 4 MG PO TBDP: 4.0000 mg | ORAL_TABLET | Freq: Four times a day (QID) | ORAL | Status: DC | PRN

## 2023-12-01 MED ORDER — DOCUSATE SODIUM 100 MG PO CAPS
100.0000 mg | ORAL_CAPSULE | Freq: Two times a day (BID) | ORAL | Status: DC
  Administered 2023-12-02 – 2023-12-06 (×9): 100 mg via ORAL
  Filled 2023-12-01 (×9): qty 1

## 2023-12-01 MED ORDER — HYDROMORPHONE HCL 1 MG/ML IJ SOLN
0.5000 mg | INTRAMUSCULAR | Status: DC | PRN
  Administered 2023-12-02: 1 mg via INTRAVENOUS
  Filled 2023-12-01 (×3): qty 1

## 2023-12-01 MED ORDER — HYDROMORPHONE HCL 1 MG/ML IJ SOLN
0.5000 mg | Freq: Once | INTRAMUSCULAR | Status: AC
  Administered 2023-12-01: 0.5 mg via INTRAVENOUS
  Filled 2023-12-01: qty 1

## 2023-12-01 MED ORDER — HYDRALAZINE HCL 20 MG/ML IJ SOLN: 10.0000 mg | INTRAMUSCULAR | Status: DC | PRN

## 2023-12-01 MED ORDER — POLYETHYLENE GLYCOL 3350 17 G PO PACK: 17.0000 g | PACK | Freq: Every day | ORAL | Status: DC | PRN

## 2023-12-01 MED ORDER — HYDRALAZINE HCL 20 MG/ML IJ SOLN
5.0000 mg | INTRAMUSCULAR | Status: DC | PRN
  Filled 2023-12-01: qty 1

## 2023-12-01 MED ORDER — METHOCARBAMOL 1000 MG/10ML IJ SOLN
500.0000 mg | Freq: Three times a day (TID) | INTRAMUSCULAR | Status: AC
  Filled 2023-12-01: qty 10

## 2023-12-01 MED ORDER — LACTATED RINGERS IV SOLN: INTRAVENOUS | Status: DC

## 2023-12-01 MED ORDER — ACETAMINOPHEN 500 MG PO TABS
1000.0000 mg | ORAL_TABLET | Freq: Four times a day (QID) | ORAL | Status: DC
  Administered 2023-12-01 – 2023-12-06 (×18): 1000 mg via ORAL
  Filled 2023-12-01 (×20): qty 2

## 2023-12-01 MED ORDER — ATORVASTATIN CALCIUM 80 MG PO TABS
80.0000 mg | ORAL_TABLET | Freq: Every morning | ORAL | Status: DC
  Administered 2023-12-02 – 2023-12-06 (×5): 80 mg via ORAL
  Filled 2023-12-01 (×5): qty 1

## 2023-12-01 MED ORDER — METOPROLOL SUCCINATE ER 50 MG PO TB24
50.0000 mg | ORAL_TABLET | Freq: Every day | ORAL | Status: DC
Start: 2023-12-02 — End: 2023-12-02
  Administered 2023-12-02: 50 mg via ORAL
  Filled 2023-12-01: qty 1

## 2023-12-01 MED ORDER — ALBUTEROL SULFATE (2.5 MG/3ML) 0.083% IN NEBU
2.5000 mg | INHALATION_SOLUTION | Freq: Four times a day (QID) | RESPIRATORY_TRACT | Status: DC | PRN
  Administered 2023-12-04: 2.5 mg via RESPIRATORY_TRACT
  Filled 2023-12-01 (×2): qty 3

## 2023-12-01 MED ORDER — ONDANSETRON HCL 4 MG/2ML IJ SOLN
4.0000 mg | Freq: Four times a day (QID) | INTRAMUSCULAR | Status: DC | PRN
  Administered 2023-12-02: 4 mg via INTRAVENOUS
  Filled 2023-12-01: qty 2

## 2023-12-01 MED ORDER — OXYCODONE HCL 5 MG PO TABS
5.0000 mg | ORAL_TABLET | Freq: Four times a day (QID) | ORAL | Status: DC | PRN
  Administered 2023-12-02 – 2023-12-03 (×4): 10 mg via ORAL
  Administered 2023-12-03 – 2023-12-05 (×7): 5 mg via ORAL
  Administered 2023-12-06: 10 mg via ORAL
  Filled 2023-12-01: qty 2
  Filled 2023-12-01 (×5): qty 1
  Filled 2023-12-01 (×2): qty 2
  Filled 2023-12-01 (×3): qty 1
  Filled 2023-12-01 (×2): qty 2

## 2023-12-01 MED ORDER — ONDANSETRON HCL 4 MG/2ML IJ SOLN
4.0000 mg | Freq: Once | INTRAMUSCULAR | Status: AC
  Administered 2023-12-01: 4 mg via INTRAVENOUS
  Filled 2023-12-01: qty 2

## 2023-12-01 MED ORDER — METHOCARBAMOL 500 MG PO TABS
500.0000 mg | ORAL_TABLET | Freq: Three times a day (TID) | ORAL | Status: AC
  Administered 2023-12-01 – 2023-12-04 (×9): 500 mg via ORAL
  Filled 2023-12-01 (×9): qty 1

## 2023-12-01 MED ORDER — METOPROLOL TARTRATE 5 MG/5ML IV SOLN: 5.0000 mg | Freq: Four times a day (QID) | INTRAVENOUS | Status: DC | PRN

## 2023-12-01 MED ORDER — LEVETIRACETAM (KEPPRA) 500 MG/5 ML ADULT IV PUSH
500.0000 mg | Freq: Two times a day (BID) | INTRAVENOUS | Status: DC
  Administered 2023-12-01 – 2023-12-05 (×8): 500 mg via INTRAVENOUS
  Filled 2023-12-01 (×9): qty 5

## 2023-12-01 MED ORDER — GABAPENTIN 300 MG PO CAPS
300.0000 mg | ORAL_CAPSULE | Freq: Every day | ORAL | Status: DC
  Administered 2023-12-02 – 2023-12-05 (×4): 300 mg via ORAL
  Filled 2023-12-01 (×4): qty 1

## 2023-12-01 MED ORDER — MORPHINE SULFATE (PF) 4 MG/ML IV SOLN: 4.0000 mg | Freq: Once | INTRAVENOUS | Status: DC

## 2023-12-01 MED ORDER — EZETIMIBE 10 MG PO TABS
10.0000 mg | ORAL_TABLET | Freq: Every day | ORAL | Status: DC
  Administered 2023-12-02 – 2023-12-06 (×5): 10 mg via ORAL
  Filled 2023-12-01 (×5): qty 1

## 2023-12-01 NOTE — Consult Note (Signed)
 Initial Consultation Note   Patient: Derrick Stokes ZOX:[id number] DOB: Apr 09, 1951 PCP: [place], [name], MD DOA: 12/01/2023 DOS: the patient was seen and examined on 12/01/2023 Primary service: Haydee Lipa, MD  Referring physician: Dr Camilo Cella Trauma Surgery Service Reason for consult: Syncope/Medical management  Assessment/Plan: Assessment and Plan:  Syncope and collapse - Likely orthostatic given history of occurrence after standing - Echocardiogram pending - Carotid ultrasound pending - CT head pending, per verbal report noted 11 mm intraparenchymal hemorrhage without midline shift(likely traumatic after fall and not the etiology for syncope) - Continue telemetry; orthostatic vital signs pending - Labs generally unremarkable  Trauma, multifactorial Left anterior rib fracture 2 through 8 Intraparenchymal bleed without midline shift - Trauma surgery following as primary -Defer to their expertise for further imaging and evaluation  Hypertension -Avoid hypotension given intracranial hemorrhage as above -Med rec pending, current list only notes metoprolol  as antihypertensive - Will add as needed hydralazine  in the interim  Hyperlipidemia - Resume statin/zetia  once confirmed  History of CAD Status post CABG, DES to Lcx and RCA - Hold any aspirin , Plavix  or other high risk medication given above bleeding - Continue Atorvastatin /zetia  as above once verified/if current - Continue Metoprolol  once verified - Hold nitroglycerin   Tobacco abuse Alcohol abuse - Cessation discussed   GERD - Continue PPI   TRH will continue to follow the patient.  HPI: Derrick Stokes is a 73 y.o. male with medical history significant of hypertension, hyperlipidemia, CAD s/p CABG 2012, stents to Lcx/RCA in 2016, COPD on room air at baseline, ongoing tobacco abuse, ongoing alcohol abuse, GERD.   Patient presents to our facility after a fall from an episode of syncope and collapse 11/30/2023  after standing up from a chair.  He awoke with severe left-sided chest wall pain - upon arrival to the ED patient was evaluated with imaging confirming nondisplaced anterior rib fractures of the left on ribs 2 through 8 without pneumothorax.  Patient subsequent imaging showed 11 mm intraparenchymal hemorrhage.  Trauma surgery called for admission, hospital service called in consult.  Review of Systems: As mentioned in the history of present illness. All other systems reviewed and are negative. Past Medical History:  Diagnosis Date   Arthritis    B12 deficiency    Borderline   CAD (coronary artery disease)    a. s/p CABG 01/2011;  b. ETT Myoview  3/14:  Low risk;  c. 09/2014 Cath/PCI: LM nl, LAD 100p, LCX 51m/OM2 60-70p (2.75x28 Synergy DES), RCA dom, 33m (atherectomy, 3.5x24 Synergy DES), PDA nl, LIMA->LAD nl, VG->Diag nl, VG->OM 100, VG->RCA 100.// Myoview  06/23/2023: No ischemia or infarction, EF 65, low risk   Cataract    bilateral sx    COPD (chronic obstructive pulmonary disease) (HCC)    GERD (gastroesophageal reflux disease)    on meds   Hiatal hernia    History of echocardiogram    Echo 10/16: EF 50-55%, no RWMA, Gr 1 DD, mild MR, mild TR   History of kidney stones    Passed   HLD (hyperlipidemia)    on meds   HOH (hard of hearing)    Hypertension    on meds   Leg pain    ABIs 4/14:  R 1.2, L 1.2, TBIs normal   Peripheral neuropathy    From trauma   Reactive airway disease 03/18/2018   Tobacco abuse    Past Surgical History:  Procedure Laterality Date   CHOLECYSTECTOMY N/A 09/19/2023   Procedure: LAPAROSCOPIC CHOLECYSTECTOMY;  Surgeon:  Davonna Estes Willeen Harold, MD;  Location: [facility] OR;  Service: General;  Laterality: N/A;   COLONOSCOPY  03/09/2020   Neg   COLONOSCOPY WITH PROPOFOL  N/A 05/08/2020   Procedure: COLONOSCOPY WITH PROPOFOL ;  Surgeon: Brice Campi Albino Alu., MD;  Location: [facility] ENDOSCOPY;  Service: Gastroenterology;  Laterality: N/A;   CORONARY ARTERY BYPASS GRAFT  2013    x5   CORONARY PRESSURE/FFR STUDY N/A 03/17/2018   Procedure: INTRAVASCULAR PRESSURE WIRE/FFR STUDY;  Surgeon: Avanell Leigh, MD;  Location: [facility] INVASIVE CV LAB;  Service: Cardiovascular;  Laterality: N/A;   ENDOSCOPIC MUCOSAL RESECTION N/A 05/08/2020   Procedure: ENDOSCOPIC MUCOSAL RESECTION;  Surgeon: Brice Campi Albino Alu., MD;  Location: [facility] ENDOSCOPY;  Service: Gastroenterology;  Laterality: N/A;   EPIDURAL STEROIDS     EYE SURGERY Bilateral    Cataract   HEMOSTASIS CLIP PLACEMENT  05/08/2020   Procedure: HEMOSTASIS CLIP PLACEMENT;  Surgeon: Normie Becton., MD;  Location: [facility] ENDOSCOPY;  Service: Gastroenterology;;   HEMOSTASIS CONTROL  05/08/2020   Procedure: HEMOSTASIS CONTROL;  Surgeon: Normie Becton., MD;  Location: [facility] ENDOSCOPY;  Service: Gastroenterology;;   LEFT HEART CATH AND CORONARY ANGIOGRAPHY N/A 03/17/2018   Procedure: LEFT HEART CATH AND CORONARY ANGIOGRAPHY;  Surgeon: Avanell Leigh, MD;  Location: [facility] INVASIVE CV LAB;  Service: Cardiovascular;  Laterality: N/A;   LEFT HEART CATH AND CORS/GRAFTS ANGIOGRAPHY N/A 10/16/2020   Procedure: LEFT HEART CATH AND CORS/GRAFTS ANGIOGRAPHY;  Surgeon: Arnoldo Lapping, MD;  Location: [facility] INVASIVE CV LAB;  Service: Cardiovascular;  Laterality: N/A;   LEFT HEART CATHETERIZATION WITH CORONARY/GRAFT ANGIOGRAM N/A 09/18/2014   Procedure: LEFT HEART CATHETERIZATION WITH Estella Helling;  Surgeon: Lucendia Rusk, MD;  Location: [facility] CATH LAB;  Service: Cardiovascular;  Laterality: N/A;   PERCUTANEOUS CORONARY ROTOBLATOR INTERVENTION (PCI-R) N/A 09/20/2014   Procedure: PERCUTANEOUS CORONARY ROTOBLATOR INTERVENTION (PCI-R);  Surgeon: Lucendia Rusk, MD;  Location: [facility] CATH LAB;  Service: Cardiovascular;  Laterality: N/A;   POLYPECTOMY  02/28/2020   6 polyps/tics/hems   POLYPECTOMY  05/08/2020   Procedure: POLYPECTOMY;  Surgeon: Brice Campi Albino Alu., MD;  Location: [facility] ENDOSCOPY;  Service: Gastroenterology;;    Daryle Eon  05/08/2020   Procedure: Daryle Eon;  Surgeon: Mansouraty, Albino Alu., MD;  Location: [facility] ENDOSCOPY;  Service: Gastroenterology;;   SUBMUCOSAL LIFTING INJECTION  05/08/2020   Procedure: SUBMUCOSAL LIFTING INJECTION;  Surgeon: Normie Becton., MD;  Location: [facility] ENDOSCOPY;  Service: Gastroenterology;;   Social History:  reports that he has been smoking cigarettes. He started smoking about 55 years ago. He has a 55.4 pack-year smoking history. He has been exposed to tobacco smoke. He has never used smokeless tobacco. He reports current alcohol use of about 3.0 standard drinks of alcohol per week. He reports that he does not use drugs.  Allergies  Allergen Reactions   Iodine Nausea And Vomiting and Other (See Comments)    Diaphoresis  Pallid complexion   Iohexol  Nausea And Vomiting   Shrimp [Shellfish Allergy] Nausea And Vomiting and Other (See Comments)    Diaphoresis Pallid complexion   Isordil  Titradose [Isosorbide  Dinitrate] Other (See Comments)    Headache     Family History  Problem Relation Age of Onset   Diabetes Father    Heart attack Father 1   Heart disease Father        CAD   Cancer Mother    Diabetes Brother    Diabetes Brother    Colon cancer Neg Hx    Esophageal cancer Neg Hx    Stomach  cancer Neg Hx    Pancreatic cancer Neg Hx    Inflammatory bowel disease Neg Hx    Liver disease Neg Hx    Rectal cancer Neg Hx    Colon polyps Neg Hx     Prior to Admission medications   Medication Sig Start Date End Date Taking? Authorizing Provider  albuterol  (VENTOLIN  HFA) 108 (90 Base) MCG/ACT inhaler INHALE 1-2 PUFFS BY MOUTH INTO THE LUNGS EVERY 6 HOURS AS NEEDED FOR WHEEZING AND/OR FOR SHORTNESS OF BREATH 03/23/23   [place], [name], MD  Aspirin -Acetaminophen -Caffeine (GOODY HEADACHE PO) Take 1-2 packets by mouth daily as needed (pain, headache).    [provider]  atorvastatin  (LIPITOR ) 80 MG tablet TAKE 1 TABLET (80 MG TOTAL) BY MOUTH  EVERY MORNING. 06/22/23   Arnoldo Lapping, MD  clopidogrel  (PLAVIX ) 75 MG tablet TAKE 1 TABLET BY MOUTH EVERY MORNING 06/22/23   Arnoldo Lapping, MD  DULoxetine  (CYMBALTA ) 60 MG capsule TAKE 1 CAPSULE BY MOUTH ONCE DAILY 07/26/23   [place], [name], MD  ezetimibe  (ZETIA ) 10 MG tablet TAKE 1 TABLET BY MOUTH DAILY 06/22/23   Arnoldo Lapping, MD  gabapentin  (NEURONTIN ) 300 MG capsule TAKE 1 CAPSULE BY MOUTH AT BEDTIME 10/20/23   [place], [name], MD  metoprolol  succinate (TOPROL -XL) 50 MG 24 hr tablet TAKE 1 TABLET BY MOUTH ONCE DAILY WITH A MEAL 06/22/23   Arnoldo Lapping, MD  nitroGLYCERIN  (NITROSTAT ) 0.4 MG SL tablet Place 1 tablet (0.4 mg total) under the tongue every 5 (five) minutes as needed for chest pain. PLACE 1 TABLET UNDER THE TONGUE EVERY 5 MINUTES X 3 DOSES AS NEEDED FOR CHEST PAIN. 12/04/21   Swinyer, Leilani Punter, NP  pantoprazole  (PROTONIX ) 40 MG tablet TAKE 1 TABLET BY MOUTH DAILY 06/22/23   Arnoldo Lapping, MD    Physical Exam: Vitals:   12/01/23 1442 12/01/23 1443  BP: (!) 132/90   Pulse: 94   Resp: 18   Temp: 98.1 F (36.7 C)   TempSrc: Oral   SpO2: 97%   Weight:  78 kg  Height:  5\' 7"  (1.702 m)    Data Reviewed:   Lab Orders         Resp panel by RT-PCR (RSV, Flu A&B, Covid) Anterior Nasal Swab         CBC with Differential         Comprehensive metabolic panel         Rapid urine drug screen (hospital performed)         Urinalysis, Routine w reflex microscopic -Urine, Clean Catch         CBC         Basic metabolic panel with GFR      Family Communication: None present Primary team communication: Discussed over the phone  Thank you very much for involving us  in the care of your patient.  Author: Diego Foy DO 12/01/2023 5:29 PM  For on call review [email or web address].

## 2023-12-01 NOTE — Progress Notes (Signed)
   Providing Compassionate, Quality Care - Together   Patient suffered a fall yesterday evening and presented to the emergency department with left shoulder and left rib pain. Imaging reviewed. CT head revealed a focal intraparenchymal hemorrhage at the left frontal vertex, measuring 1.1 x 0.8 x 0.8 cm. By report, patient is neurologically intact. He is on aspirin  and Plavix  for CAD s/p CABG in 2012, with DES to LCX and RCA in 2016. Recommend holding ASA and Plavix  and rescanning in 8-12 hours.   Henreitta Locus, DNP, AGNP-C Nurse Practitioner 12/01/2023, 6:09 PM     [facility] [address], [place], [place] [address] P: [phone]    F: [phone]

## 2023-12-01 NOTE — ED Notes (Signed)
 Report given to [facility] @ [facility]

## 2023-12-01 NOTE — H&P (Addendum)
 CC: Syncopal event, L ribs 2-8, 11 mm IPH  HPI: Derrick Stokes is an 73 y.o. male hx of hypertension, hyperlipidemia, CAD s/p CABG 2012 with subsequent DES to LCx and RCA in 2016, COPD, tobacco abuse, alcohol abuse, GERD -- presented to [facility] ED following syncopal event last night - reports stood up from chair last night around 7pm and subsequently lost consciousness, falling to ground. Awoke with severe left sided chest wall pain suspecting he had multiple broken ribs. Hurt to take deep breath. Subsequently presented to ER 5/14 for further evaluation.  Presently he reports that his pain is better controlled but he still struggles to take a deep breath.  He does not yet have an incentive spirometer.   Underwent workup in ER and we were asked to see for admission.    Past Medical History:  Diagnosis Date   Arthritis    B12 deficiency    Borderline   CAD (coronary artery disease)    a. s/p CABG 01/2011;  b. ETT Myoview  3/14:  Low risk;  c. 09/2014 Cath/PCI: LM nl, LAD 100p, LCX 29m/OM2 60-70p (2.75x28 Synergy DES), RCA dom, 33m (atherectomy, 3.5x24 Synergy DES), PDA nl, LIMA->LAD nl, VG->Diag nl, VG->OM 100, VG->RCA 100.// Myoview  06/23/2023: No ischemia or infarction, EF 65, low risk   Cataract    bilateral sx    COPD (chronic obstructive pulmonary disease) (HCC)    GERD (gastroesophageal reflux disease)    on meds   Hiatal hernia    History of echocardiogram    Echo 10/16: EF 50-55%, no RWMA, Gr 1 DD, mild MR, mild TR   History of kidney stones    Passed   HLD (hyperlipidemia)    on meds   HOH (hard of hearing)    Hypertension    on meds   Leg pain    ABIs 4/14:  R 1.2, L 1.2, TBIs normal   Peripheral neuropathy    From trauma   Reactive airway disease 03/18/2018   Tobacco abuse     Past Surgical History:  Procedure Laterality Date   CHOLECYSTECTOMY N/A 09/19/2023   Procedure: LAPAROSCOPIC CHOLECYSTECTOMY;  Surgeon: Cannon Champion, MD;  Location: [facility] OR;  Service: General;   Laterality: N/A;   COLONOSCOPY  03/09/2020   Neg   COLONOSCOPY WITH PROPOFOL  N/A 05/08/2020   Procedure: COLONOSCOPY WITH PROPOFOL ;  Surgeon: Normie Becton., MD;  Location: [facility] ENDOSCOPY;  Service: Gastroenterology;  Laterality: N/A;   CORONARY ARTERY BYPASS GRAFT  2013   x5   CORONARY PRESSURE/FFR STUDY N/A 03/17/2018   Procedure: INTRAVASCULAR PRESSURE WIRE/FFR STUDY;  Surgeon: Avanell Leigh, MD;  Location: [facility] INVASIVE CV LAB;  Service: Cardiovascular;  Laterality: N/A;   ENDOSCOPIC MUCOSAL RESECTION N/A 05/08/2020   Procedure: ENDOSCOPIC MUCOSAL RESECTION;  Surgeon: Brice Campi Albino Alu., MD;  Location: [facility] ENDOSCOPY;  Service: Gastroenterology;  Laterality: N/A;   EPIDURAL STEROIDS     EYE SURGERY Bilateral    Cataract   HEMOSTASIS CLIP PLACEMENT  05/08/2020   Procedure: HEMOSTASIS CLIP PLACEMENT;  Surgeon: Normie Becton., MD;  Location: [facility] ENDOSCOPY;  Service: Gastroenterology;;   HEMOSTASIS CONTROL  05/08/2020   Procedure: HEMOSTASIS CONTROL;  Surgeon: Normie Becton., MD;  Location: [facility] ENDOSCOPY;  Service: Gastroenterology;;   LEFT HEART CATH AND CORONARY ANGIOGRAPHY N/A 03/17/2018   Procedure: LEFT HEART CATH AND CORONARY ANGIOGRAPHY;  Surgeon: Avanell Leigh, MD;  Location: [facility] INVASIVE CV LAB;  Service: Cardiovascular;  Laterality: N/A;   LEFT HEART CATH  AND CORS/GRAFTS ANGIOGRAPHY N/A 10/16/2020   Procedure: LEFT HEART CATH AND CORS/GRAFTS ANGIOGRAPHY;  Surgeon: Arnoldo Lapping, MD;  Location: [facility] INVASIVE CV LAB;  Service: Cardiovascular;  Laterality: N/A;   LEFT HEART CATHETERIZATION WITH CORONARY/GRAFT ANGIOGRAM N/A 09/18/2014   Procedure: LEFT HEART CATHETERIZATION WITH Estella Helling;  Surgeon: Lucendia Rusk, MD;  Location: [facility] CATH LAB;  Service: Cardiovascular;  Laterality: N/A;   PERCUTANEOUS CORONARY ROTOBLATOR INTERVENTION (PCI-R) N/A 09/20/2014   Procedure: PERCUTANEOUS CORONARY ROTOBLATOR INTERVENTION (PCI-R);  Surgeon: Lucendia Rusk, MD;  Location: [facility] CATH LAB;  Service: Cardiovascular;  Laterality: N/A;   POLYPECTOMY  02/28/2020   6 polyps/tics/hems   POLYPECTOMY  05/08/2020   Procedure: POLYPECTOMY;  Surgeon: Mansouraty, Albino Alu., MD;  Location: [facility] ENDOSCOPY;  Service: Gastroenterology;;   Daryle Eon  05/08/2020   Procedure: Daryle Eon;  Surgeon: Mansouraty, Albino Alu., MD;  Location: [facility] ENDOSCOPY;  Service: Gastroenterology;;   SUBMUCOSAL LIFTING INJECTION  05/08/2020   Procedure: SUBMUCOSAL LIFTING INJECTION;  Surgeon: Normie Becton., MD;  Location: [facility] ENDOSCOPY;  Service: Gastroenterology;;    Family History  Problem Relation Age of Onset   Diabetes Father    Heart attack Father 49   Heart disease Father        CAD   Cancer Mother    Diabetes Brother    Diabetes Brother    Colon cancer Neg Hx    Esophageal cancer Neg Hx    Stomach cancer Neg Hx    Pancreatic cancer Neg Hx    Inflammatory bowel disease Neg Hx    Liver disease Neg Hx    Rectal cancer Neg Hx    Colon polyps Neg Hx     Social:  reports that he has been smoking cigarettes. He started smoking about 55 years ago. He has a 55.4 pack-year smoking history. He has been exposed to tobacco smoke. He has never used smokeless tobacco. He reports current alcohol use of about 3.0 standard drinks of alcohol per week. He reports that he does not use drugs.  Allergies:  Allergies  Allergen Reactions   Iodine Nausea And Vomiting and Other (See Comments)    Diaphoresis  Pallid complexion   Iohexol  Nausea And Vomiting   Shrimp [Shellfish Allergy] Nausea And Vomiting and Other (See Comments)    Diaphoresis Pallid complexion   Isordil  Titradose [Isosorbide  Dinitrate] Other (See Comments)    Headache     Medications: I have reviewed the patient's current medications.  Results for orders placed or performed during the hospital encounter of 12/01/23 (from the past 48 hours)  Resp panel by RT-PCR (RSV, Flu A&B, Covid)  Anterior Nasal Swab     Status: None   Collection Time: 12/01/23  3:59 PM   Specimen: Anterior Nasal Swab  Result Value Ref Range   SARS Coronavirus 2 by RT PCR NEGATIVE NEGATIVE    Comment: (NOTE) SARS-CoV-2 target nucleic acids are NOT DETECTED.  The SARS-CoV-2 RNA is generally detectable in upper respiratory specimens during the acute phase of infection. The lowest concentration of SARS-CoV-2 viral copies this assay can detect is 138 copies/mL. A negative result does not preclude SARS-Cov-2 infection and should not be used as the sole basis for treatment or other patient management decisions. A negative result may occur with  improper specimen collection/handling, submission of specimen other than nasopharyngeal swab, presence of viral mutation(s) within the areas targeted by this assay, and inadequate number of viral copies(<138 copies/mL). A negative result must be combined with clinical observations, patient  history, and epidemiological information. The expected result is Negative.  Fact Sheet for Patients:  BloggerCourse.com  Fact Sheet for Healthcare Providers:  SeriousBroker.it  This test is no t yet approved or cleared by the [place]  FDA and  has been authorized for detection and/or diagnosis of SARS-CoV-2 by FDA under an Emergency Use Authorization (EUA). This EUA will remain  in effect (meaning this test can be used) for the duration of the COVID-19 declaration under Section 564(b)(1) of the Act, 21 U.S.C.section 360bbb-3(b)(1), unless the authorization is terminated  or revoked sooner.       Influenza A by PCR NEGATIVE NEGATIVE   Influenza B by PCR NEGATIVE NEGATIVE    Comment: (NOTE) The Xpert Xpress SARS-CoV-2/FLU/RSV plus assay is intended as an aid in the diagnosis of influenza from Nasopharyngeal swab specimens and should not be used as a sole basis for treatment. Nasal washings and aspirates are  unacceptable for Xpert Xpress SARS-CoV-2/FLU/RSV testing.  Fact Sheet for Patients: BloggerCourse.com  Fact Sheet for Healthcare Providers: SeriousBroker.it  This test is not yet approved or cleared by the [place]  FDA and has been authorized for detection and/or diagnosis of SARS-CoV-2 by FDA under an Emergency Use Authorization (EUA). This EUA will remain in effect (meaning this test can be used) for the duration of the COVID-19 declaration under Section 564(b)(1) of the Act, 21 U.S.C. section 360bbb-3(b)(1), unless the authorization is terminated or revoked.     Resp Syncytial Virus by PCR NEGATIVE NEGATIVE    Comment: (NOTE) Fact Sheet for Patients: BloggerCourse.com  Fact Sheet for Healthcare Providers: SeriousBroker.it  This test is not yet approved or cleared by the [place]  FDA and has been authorized for detection and/or diagnosis of SARS-CoV-2 by FDA under an Emergency Use Authorization (EUA). This EUA will remain in effect (meaning this test can be used) for the duration of the COVID-19 declaration under Section 564(b)(1) of the Act, 21 U.S.C. section 360bbb-3(b)(1), unless the authorization is terminated or revoked.  Performed at [facility], [address], [place], [place] [address]   CBC with Differential     Status: Abnormal   Collection Time: 12/01/23  4:16 PM  Result Value Ref Range   WBC 10.8 (H) 4.0 - 10.5 K/uL   RBC 4.49 4.22 - 5.81 MIL/uL   Hemoglobin 14.5 13.0 - 17.0 g/dL   HCT 60.4 54.0 - 98.1 %   MCV 93.3 80.0 - 100.0 fL   MCH 32.3 26.0 - 34.0 pg   MCHC 34.6 30.0 - 36.0 g/dL   RDW 19.1 47.8 - 29.5 %   Platelets 207 150 - 400 K/uL   nRBC 0.0 0.0 - 0.2 %   Neutrophils Relative % 77 %   Neutro Abs 8.3 (H) 1.7 - 7.7 K/uL   Lymphocytes Relative 13 %   Lymphs Abs 1.4 0.7 - 4.0 K/uL   Monocytes Relative 9 %   Monocytes  Absolute 0.9 0.1 - 1.0 K/uL   Eosinophils Relative 0 %   Eosinophils Absolute 0.0 0.0 - 0.5 K/uL   Basophils Relative 0 %   Basophils Absolute 0.0 0.0 - 0.1 K/uL   Immature Granulocytes 1 %   Abs Immature Granulocytes 0.05 0.00 - 0.07 K/uL    Comment: Performed at [facility], [address], [place], [place] [address]  Comprehensive metabolic panel     Status: Abnormal   Collection Time: 12/01/23  4:16 PM  Result Value Ref Range   Sodium 135 135 -  145 mmol/L   Potassium 4.0 3.5 - 5.1 mmol/L   Chloride 100 98 - 111 mmol/L   CO2 24 22 - 32 mmol/L   Glucose, Bld 104 (H) 70 - 99 mg/dL    Comment: Glucose reference range applies only to samples taken after fasting for at least 8 hours.   BUN 10 8 - 23 mg/dL   Creatinine, Ser 0.10 (L) 0.61 - 1.24 mg/dL   Calcium  9.5 8.9 - 10.3 mg/dL   Total Protein 8.2 (H) 6.5 - 8.1 g/dL   Albumin 4.3 3.5 - 5.0 g/dL   AST 33 15 - 41 U/L   ALT 30 0 - 44 U/L   Alkaline Phosphatase 113 38 - 126 U/L   Total Bilirubin 1.8 (H) 0.0 - 1.2 mg/dL   GFR, Estimated >93 >23 mL/min    Comment: (NOTE) Calculated using the CKD-EPI Creatinine Equation (2021)    Anion gap 11 5 - 15    Comment: Performed at [facility], [address], [place], [place] [address]  Troponin I (High Sensitivity)     Status: None   Collection Time: 12/01/23  4:16 PM  Result Value Ref Range   Troponin I (High Sensitivity) 3 <18 ng/L    Comment: (NOTE) Elevated high sensitivity troponin I (hsTnI) values and significant  changes across serial measurements may suggest ACS but many other  chronic and acute conditions are known to elevate hsTnI results.  Refer to the "Links" section for chest pain algorithms and additional  guidance. Performed at [facility], [address], [place], [place] [address]     CT Head Wo Contrast Result Date: 12/01/2023 CLINICAL DATA:  Fall with head trauma EXAM: CT HEAD WITHOUT CONTRAST CT  CERVICAL SPINE WITHOUT CONTRAST TECHNIQUE: Multidetector CT imaging of the head and cervical spine was performed following the standard protocol without intravenous contrast. Multiplanar CT image reconstructions of the cervical spine were also generated. RADIATION DOSE REDUCTION: This exam was performed according to the departmental dose-optimization program which includes automated exposure control, adjustment of the mA and/or kV according to patient size and/or use of iterative reconstruction technique. COMPARISON:  None Available. FINDINGS: CT HEAD FINDINGS Brain: Negative for intracranial mass. Atrophy and moderate severe chronic small vessel ischemic changes of the white matter. Prominent ventricles felt secondary to atrophy. Focal intraparenchymal hemorrhage at the left frontal vertex, this measures 1.1 x 0.8 x 0.8 cm, estimated volume of less than 1 mL. Mild surrounding hypodense edema. No significant mass effect or midline shift. Vascular: No hyperdense vessels.  Carotid vascular calcifications. Skull: No definitive fracture Sinuses/Orbits: Moderate mucosal thickening in the sinuses Other: Traumatic Brain Injury Risk Stratification Skull Fracture: No - Low/mBIG 1 Subdural Hematoma (SDH): No - Low Subarachnoid Hemorrhage [facility]): No Epidural Hematoma (EDH): No - Low/mBIG 1 Cerebral contusion, intra-axial, intraparenchymal Hemorrhage (IPH): No Intraventricular Hemorrhage (IVH): No - Low/mBIG 1 Midline Shift > 1mm or Edema/effacement of sulci/vents: No - Low/mBIG 1 ---------------------------------------------------- CT CERVICAL SPINE FINDINGS Alignment: Trace anterolisthesis C2 on C3. Facet alignment is within normal limits. Skull base and vertebrae: No acute fracture. No primary bone lesion or focal pathologic process. Soft tissues and spinal canal: No prevertebral fluid or swelling. No visible canal hematoma. Disc levels: Mild disc space narrowing at C3-C4. Multilevel mild facet degenerative changes. Upper  chest: See separately dictated chest CT Other: None IMPRESSION: 1. 1.1 cm intraparenchymal hemorrhage at the left frontal vertex with mild surrounding edema. No significant mass effect or midline shift. 2.  Atrophy and chronic small vessel ischemic changes of the white matter. 3. No CT evidence for acute osseous abnormality of the cervical spine. Critical Value/emergent results were called by telephone at the time of interpretation on 12/01/2023 at 5:10 pm to provider HAYLEY NAASZ , who verbally acknowledged these results. Electronically Signed   By: Esmeralda Hedge M.D.   On: 12/01/2023 17:34   CT Cervical Spine Wo Contrast Result Date: 12/01/2023 CLINICAL DATA:  Fall with head trauma EXAM: CT HEAD WITHOUT CONTRAST CT CERVICAL SPINE WITHOUT CONTRAST TECHNIQUE: Multidetector CT imaging of the head and cervical spine was performed following the standard protocol without intravenous contrast. Multiplanar CT image reconstructions of the cervical spine were also generated. RADIATION DOSE REDUCTION: This exam was performed according to the departmental dose-optimization program which includes automated exposure control, adjustment of the mA and/or kV according to patient size and/or use of iterative reconstruction technique. COMPARISON:  None Available. FINDINGS: CT HEAD FINDINGS Brain: Negative for intracranial mass. Atrophy and moderate severe chronic small vessel ischemic changes of the white matter. Prominent ventricles felt secondary to atrophy. Focal intraparenchymal hemorrhage at the left frontal vertex, this measures 1.1 x 0.8 x 0.8 cm, estimated volume of less than 1 mL. Mild surrounding hypodense edema. No significant mass effect or midline shift. Vascular: No hyperdense vessels.  Carotid vascular calcifications. Skull: No definitive fracture Sinuses/Orbits: Moderate mucosal thickening in the sinuses Other: Traumatic Brain Injury Risk Stratification Skull Fracture: No - Low/mBIG 1 Subdural Hematoma (SDH): No -  Low Subarachnoid Hemorrhage [facility]): No Epidural Hematoma (EDH): No - Low/mBIG 1 Cerebral contusion, intra-axial, intraparenchymal Hemorrhage (IPH): No Intraventricular Hemorrhage (IVH): No - Low/mBIG 1 Midline Shift > 1mm or Edema/effacement of sulci/vents: No - Low/mBIG 1 ---------------------------------------------------- CT CERVICAL SPINE FINDINGS Alignment: Trace anterolisthesis C2 on C3. Facet alignment is within normal limits. Skull base and vertebrae: No acute fracture. No primary bone lesion or focal pathologic process. Soft tissues and spinal canal: No prevertebral fluid or swelling. No visible canal hematoma. Disc levels: Mild disc space narrowing at C3-C4. Multilevel mild facet degenerative changes. Upper chest: See separately dictated chest CT Other: None IMPRESSION: 1. 1.1 cm intraparenchymal hemorrhage at the left frontal vertex with mild surrounding edema. No significant mass effect or midline shift. 2. Atrophy and chronic small vessel ischemic changes of the white matter. 3. No CT evidence for acute osseous abnormality of the cervical spine. Critical Value/emergent results were called by telephone at the time of interpretation on 12/01/2023 at 5:10 pm to provider HAYLEY NAASZ , who verbally acknowledged these results. Electronically Signed   By: Esmeralda Hedge M.D.   On: 12/01/2023 17:34   CT Thoracic Spine Wo Contrast Result Date: 12/01/2023 CLINICAL DATA:  Back trauma. EXAM: CT THORACIC SPINE WITHOUT CONTRAST TECHNIQUE: Multidetector CT images of the thoracic were obtained using the standard protocol without intravenous contrast. RADIATION DOSE REDUCTION: This exam was performed according to the departmental dose-optimization program which includes automated exposure control, adjustment of the mA and/or kV according to patient size and/or use of iterative reconstruction technique. COMPARISON:  Chest CT dated 08/08/2021. FINDINGS: Alignment: No acute subluxation. Vertebrae: No acute fracture. The  bones are osteopenic. Multilevel old compression fractures most prominent at T6 and T8. Paraspinal and other soft tissues: Negative. Disc levels: Multilevel degenerative changes. IMPRESSION: 1. No acute/traumatic thoracic spine pathology. 2. Multilevel old compression fractures. Electronically Signed   By: Angus Bark M.D.   On: 12/01/2023 16:58   CT Chest Wo Contrast Result Date: 12/01/2023 CLINICAL DATA:  Chest trauma.  Fall. EXAM: CT CHEST WITHOUT CONTRAST TECHNIQUE: Multidetector CT imaging of the chest was performed following the standard protocol without IV contrast. RADIATION DOSE REDUCTION: This exam was performed according to the departmental dose-optimization program which includes automated exposure control, adjustment of the mA and/or kV according to patient size and/or use of iterative reconstruction technique. COMPARISON:  Chest CT dated 08/08/2021. FINDINGS: Evaluation of this exam is limited in the absence of intravenous contrast. Cardiovascular: There is no cardiomegaly or pericardial effusion. There is coronary vascular calcification and postsurgical changes of CABG. Mild atherosclerotic calcification of the thoracic aorta. No aneurysmal dilatation. The central pulmonary arteries are grossly unremarkable. Mediastinum/Nodes: No hilar or mediastinal adenopathy. Small hiatal hernia. The esophagus and the thyroid  gland are grossly unremarkable. No mediastinal fluid collection. Lungs/Pleura: Left lung base subpleural atelectasis. No focal consolidation, pleural effusion, or pneumothorax. The central airways are patent. Upper Abdomen: No acute abnormality. Musculoskeletal: Median sternotomy wires. Nondisplaced fractures of the anterior left 2nd-8th ribs. There is osteopenia with degenerative changes of the spine. Multilevel old appearing compression fractures of the thoracic spine. IMPRESSION: 1. Nondisplaced fractures of the anterior left 2nd-8th ribs. No pneumothorax. 2.  Aortic Atherosclerosis  (ICD10-I70.0). Electronically Signed   By: Angus Bark M.D.   On: 12/01/2023 16:55    ROS - all of the below systems have been reviewed with the patient and positives are indicated with bold text General: chills, fever or night sweats Eyes: blurry vision or double vision ENT: epistaxis or sore throat Allergy/Immunology: itchy/watery eyes or nasal congestion Hematologic/Lymphatic: bleeding problems, blood clots or swollen lymph nodes Endocrine: temperature intolerance or unexpected weight changes Breast: new or changing breast lumps or nipple discharge Resp: cough, shortness of breath, or wheezing CV: chest pain or dyspnea on exertion GI: as per HPI GU: dysuria, trouble voiding, or hematuria MSK: joint pain or joint stiffness Neuro: TIA or stroke symptoms Derm: pruritus and skin lesion changes Psych: anxiety and depression  PE Blood pressure (!) 132/90, pulse 94, temperature 98.1 F (36.7 C), temperature source Oral, resp. rate 18, height 5\' 7"  (1.702 m), weight 78 kg, SpO2 97%. Constitutional: NAD; conversant; no deformities Eyes: Moist conjunctiva; no lid lag; anicteric; PERRL Neck: Trachea midline; no crepitus or hematoma, no JVD, no thyromegaly Lungs: Normal respiratory effort; no tactile fremitus CV: RRR; no palpable thrills; no pitting edema GI: Abd soft and nontender, nondistended; no palpable hepatosplenomegaly MSK: Normal range of motion of extremities; no clubbing/cyanosis Psychiatric: Appropriate affect; alert and oriented x3  Results for orders placed or performed during the hospital encounter of 12/01/23 (from the past 48 hours)  Resp panel by RT-PCR (RSV, Flu A&B, Covid) Anterior Nasal Swab     Status: None   Collection Time: 12/01/23  3:59 PM   Specimen: Anterior Nasal Swab  Result Value Ref Range   SARS Coronavirus 2 by RT PCR NEGATIVE NEGATIVE    Comment: (NOTE) SARS-CoV-2 target nucleic acids are NOT DETECTED.  The SARS-CoV-2 RNA is generally detectable  in upper respiratory specimens during the acute phase of infection. The lowest concentration of SARS-CoV-2 viral copies this assay can detect is 138 copies/mL. A negative result does not preclude SARS-Cov-2 infection and should not be used as the sole basis for treatment or other patient management decisions. A negative result may occur with  improper specimen collection/handling, submission of specimen other than nasopharyngeal swab, presence of viral mutation(s) within the areas targeted by this assay, and inadequate number of viral copies(<138 copies/mL). A negative result  must be combined with clinical observations, patient history, and epidemiological information. The expected result is Negative.  Fact Sheet for Patients:  BloggerCourse.com  Fact Sheet for Healthcare Providers:  SeriousBroker.it  This test is no t yet approved or cleared by the [place]  FDA and  has been authorized for detection and/or diagnosis of SARS-CoV-2 by FDA under an Emergency Use Authorization (EUA). This EUA will remain  in effect (meaning this test can be used) for the duration of the COVID-19 declaration under Section 564(b)(1) of the Act, 21 U.S.C.section 360bbb-3(b)(1), unless the authorization is terminated  or revoked sooner.       Influenza A by PCR NEGATIVE NEGATIVE   Influenza B by PCR NEGATIVE NEGATIVE    Comment: (NOTE) The Xpert Xpress SARS-CoV-2/FLU/RSV plus assay is intended as an aid in the diagnosis of influenza from Nasopharyngeal swab specimens and should not be used as a sole basis for treatment. Nasal washings and aspirates are unacceptable for Xpert Xpress SARS-CoV-2/FLU/RSV testing.  Fact Sheet for Patients: BloggerCourse.com  Fact Sheet for Healthcare Providers: SeriousBroker.it  This test is not yet approved or cleared by the [place]  FDA and has been  authorized for detection and/or diagnosis of SARS-CoV-2 by FDA under an Emergency Use Authorization (EUA). This EUA will remain in effect (meaning this test can be used) for the duration of the COVID-19 declaration under Section 564(b)(1) of the Act, 21 U.S.C. section 360bbb-3(b)(1), unless the authorization is terminated or revoked.     Resp Syncytial Virus by PCR NEGATIVE NEGATIVE    Comment: (NOTE) Fact Sheet for Patients: BloggerCourse.com  Fact Sheet for Healthcare Providers: SeriousBroker.it  This test is not yet approved or cleared by the [place]  FDA and has been authorized for detection and/or diagnosis of SARS-CoV-2 by FDA under an Emergency Use Authorization (EUA). This EUA will remain in effect (meaning this test can be used) for the duration of the COVID-19 declaration under Section 564(b)(1) of the Act, 21 U.S.C. section 360bbb-3(b)(1), unless the authorization is terminated or revoked.  Performed at [facility], [address] [place], [place] [address]   CBC with Differential     Status: Abnormal   Collection Time: 12/01/23  4:16 PM  Result Value Ref Range   WBC 10.8 (H) 4.0 - 10.5 K/uL   RBC 4.49 4.22 - 5.81 MIL/uL   Hemoglobin 14.5 13.0 - 17.0 g/dL   HCT 60.4 54.0 - 98.1 %   MCV 93.3 80.0 - 100.0 fL   MCH 32.3 26.0 - 34.0 pg   MCHC 34.6 30.0 - 36.0 g/dL   RDW 19.1 47.8 - 29.5 %   Platelets 207 150 - 400 K/uL   nRBC 0.0 0.0 - 0.2 %   Neutrophils Relative % 77 %   Neutro Abs 8.3 (H) 1.7 - 7.7 K/uL   Lymphocytes Relative 13 %   Lymphs Abs 1.4 0.7 - 4.0 K/uL   Monocytes Relative 9 %   Monocytes Absolute 0.9 0.1 - 1.0 K/uL   Eosinophils Relative 0 %   Eosinophils Absolute 0.0 0.0 - 0.5 K/uL   Basophils Relative 0 %   Basophils Absolute 0.0 0.0 - 0.1 K/uL   Immature Granulocytes 1 %   Abs Immature Granulocytes 0.05 0.00 - 0.07 K/uL    Comment: Performed at [facility], [address], [place], [place] [address]  Comprehensive metabolic panel     Status: Abnormal   Collection Time: 12/01/23  4:16 PM  Result Value Ref  Range   Sodium 135 135 - 145 mmol/L   Potassium 4.0 3.5 - 5.1 mmol/L   Chloride 100 98 - 111 mmol/L   CO2 24 22 - 32 mmol/L   Glucose, Bld 104 (H) 70 - 99 mg/dL    Comment: Glucose reference range applies only to samples taken after fasting for at least 8 hours.   BUN 10 8 - 23 mg/dL   Creatinine, Ser 1.61 (L) 0.61 - 1.24 mg/dL   Calcium  9.5 8.9 - 10.3 mg/dL   Total Protein 8.2 (H) 6.5 - 8.1 g/dL   Albumin 4.3 3.5 - 5.0 g/dL   AST 33 15 - 41 U/L   ALT 30 0 - 44 U/L   Alkaline Phosphatase 113 38 - 126 U/L   Total Bilirubin 1.8 (H) 0.0 - 1.2 mg/dL   GFR, Estimated >09 >60 mL/min    Comment: (NOTE) Calculated using the CKD-EPI Creatinine Equation (2021)    Anion gap 11 5 - 15    Comment: Performed at [facility], [address] [place], [place] [address]  Troponin I (High Sensitivity)     Status: None   Collection Time: 12/01/23  4:16 PM  Result Value Ref Range   Troponin I (High Sensitivity) 3 <18 ng/L    Comment: (NOTE) Elevated high sensitivity troponin I (hsTnI) values and significant  changes across serial measurements may suggest ACS but many other  chronic and acute conditions are known to elevate hsTnI results.  Refer to the "Links" section for chest pain algorithms and additional  guidance. Performed at [facility], [address], [place], [place] [address]     CT Head Wo Contrast Result Date: 12/01/2023 CLINICAL DATA:  Fall with head trauma EXAM: CT HEAD WITHOUT CONTRAST CT CERVICAL SPINE WITHOUT CONTRAST TECHNIQUE: Multidetector CT imaging of the head and cervical spine was performed following the standard protocol without intravenous contrast. Multiplanar CT image reconstructions of the cervical spine were also generated. RADIATION DOSE REDUCTION: This exam was  performed according to the departmental dose-optimization program which includes automated exposure control, adjustment of the mA and/or kV according to patient size and/or use of iterative reconstruction technique. COMPARISON:  None Available. FINDINGS: CT HEAD FINDINGS Brain: Negative for intracranial mass. Atrophy and moderate severe chronic small vessel ischemic changes of the white matter. Prominent ventricles felt secondary to atrophy. Focal intraparenchymal hemorrhage at the left frontal vertex, this measures 1.1 x 0.8 x 0.8 cm, estimated volume of less than 1 mL. Mild surrounding hypodense edema. No significant mass effect or midline shift. Vascular: No hyperdense vessels.  Carotid vascular calcifications. Skull: No definitive fracture Sinuses/Orbits: Moderate mucosal thickening in the sinuses Other: Traumatic Brain Injury Risk Stratification Skull Fracture: No - Low/mBIG 1 Subdural Hematoma (SDH): No - Low Subarachnoid Hemorrhage [facility]): No Epidural Hematoma (EDH): No - Low/mBIG 1 Cerebral contusion, intra-axial, intraparenchymal Hemorrhage (IPH): No Intraventricular Hemorrhage (IVH): No - Low/mBIG 1 Midline Shift > 1mm or Edema/effacement of sulci/vents: No - Low/mBIG 1 ---------------------------------------------------- CT CERVICAL SPINE FINDINGS Alignment: Trace anterolisthesis C2 on C3. Facet alignment is within normal limits. Skull base and vertebrae: No acute fracture. No primary bone lesion or focal pathologic process. Soft tissues and spinal canal: No prevertebral fluid or swelling. No visible canal hematoma. Disc levels: Mild disc space narrowing at C3-C4. Multilevel mild facet degenerative changes. Upper chest: See separately dictated chest CT Other: None IMPRESSION: 1. 1.1 cm intraparenchymal hemorrhage at the left frontal vertex with mild surrounding edema. No significant  mass effect or midline shift. 2. Atrophy and chronic small vessel ischemic changes of the white matter. 3. No CT evidence for  acute osseous abnormality of the cervical spine. Critical Value/emergent results were called by telephone at the time of interpretation on 12/01/2023 at 5:10 pm to provider HAYLEY NAASZ , who verbally acknowledged these results. Electronically Signed   By: Esmeralda Hedge M.D.   On: 12/01/2023 17:34   CT Cervical Spine Wo Contrast Result Date: 12/01/2023 CLINICAL DATA:  Fall with head trauma EXAM: CT HEAD WITHOUT CONTRAST CT CERVICAL SPINE WITHOUT CONTRAST TECHNIQUE: Multidetector CT imaging of the head and cervical spine was performed following the standard protocol without intravenous contrast. Multiplanar CT image reconstructions of the cervical spine were also generated. RADIATION DOSE REDUCTION: This exam was performed according to the departmental dose-optimization program which includes automated exposure control, adjustment of the mA and/or kV according to patient size and/or use of iterative reconstruction technique. COMPARISON:  None Available. FINDINGS: CT HEAD FINDINGS Brain: Negative for intracranial mass. Atrophy and moderate severe chronic small vessel ischemic changes of the white matter. Prominent ventricles felt secondary to atrophy. Focal intraparenchymal hemorrhage at the left frontal vertex, this measures 1.1 x 0.8 x 0.8 cm, estimated volume of less than 1 mL. Mild surrounding hypodense edema. No significant mass effect or midline shift. Vascular: No hyperdense vessels.  Carotid vascular calcifications. Skull: No definitive fracture Sinuses/Orbits: Moderate mucosal thickening in the sinuses Other: Traumatic Brain Injury Risk Stratification Skull Fracture: No - Low/mBIG 1 Subdural Hematoma (SDH): No - Low Subarachnoid Hemorrhage [facility]): No Epidural Hematoma (EDH): No - Low/mBIG 1 Cerebral contusion, intra-axial, intraparenchymal Hemorrhage (IPH): No Intraventricular Hemorrhage (IVH): No - Low/mBIG 1 Midline Shift > 1mm or Edema/effacement of sulci/vents: No - Low/mBIG 1  ---------------------------------------------------- CT CERVICAL SPINE FINDINGS Alignment: Trace anterolisthesis C2 on C3. Facet alignment is within normal limits. Skull base and vertebrae: No acute fracture. No primary bone lesion or focal pathologic process. Soft tissues and spinal canal: No prevertebral fluid or swelling. No visible canal hematoma. Disc levels: Mild disc space narrowing at C3-C4. Multilevel mild facet degenerative changes. Upper chest: See separately dictated chest CT Other: None IMPRESSION: 1. 1.1 cm intraparenchymal hemorrhage at the left frontal vertex with mild surrounding edema. No significant mass effect or midline shift. 2. Atrophy and chronic small vessel ischemic changes of the white matter. 3. No CT evidence for acute osseous abnormality of the cervical spine. Critical Value/emergent results were called by telephone at the time of interpretation on 12/01/2023 at 5:10 pm to provider HAYLEY NAASZ , who verbally acknowledged these results. Electronically Signed   By: Esmeralda Hedge M.D.   On: 12/01/2023 17:34   CT Thoracic Spine Wo Contrast Result Date: 12/01/2023 CLINICAL DATA:  Back trauma. EXAM: CT THORACIC SPINE WITHOUT CONTRAST TECHNIQUE: Multidetector CT images of the thoracic were obtained using the standard protocol without intravenous contrast. RADIATION DOSE REDUCTION: This exam was performed according to the departmental dose-optimization program which includes automated exposure control, adjustment of the mA and/or kV according to patient size and/or use of iterative reconstruction technique. COMPARISON:  Chest CT dated 08/08/2021. FINDINGS: Alignment: No acute subluxation. Vertebrae: No acute fracture. The bones are osteopenic. Multilevel old compression fractures most prominent at T6 and T8. Paraspinal and other soft tissues: Negative. Disc levels: Multilevel degenerative changes. IMPRESSION: 1. No acute/traumatic thoracic spine pathology. 2. Multilevel old compression  fractures. Electronically Signed   By: Angus Bark M.D.   On: 12/01/2023 16:58   CT Chest  Wo Contrast Result Date: 12/01/2023 CLINICAL DATA:  Chest trauma.  Fall. EXAM: CT CHEST WITHOUT CONTRAST TECHNIQUE: Multidetector CT imaging of the chest was performed following the standard protocol without IV contrast. RADIATION DOSE REDUCTION: This exam was performed according to the departmental dose-optimization program which includes automated exposure control, adjustment of the mA and/or kV according to patient size and/or use of iterative reconstruction technique. COMPARISON:  Chest CT dated 08/08/2021. FINDINGS: Evaluation of this exam is limited in the absence of intravenous contrast. Cardiovascular: There is no cardiomegaly or pericardial effusion. There is coronary vascular calcification and postsurgical changes of CABG. Mild atherosclerotic calcification of the thoracic aorta. No aneurysmal dilatation. The central pulmonary arteries are grossly unremarkable. Mediastinum/Nodes: No hilar or mediastinal adenopathy. Small hiatal hernia. The esophagus and the thyroid  gland are grossly unremarkable. No mediastinal fluid collection. Lungs/Pleura: Left lung base subpleural atelectasis. No focal consolidation, pleural effusion, or pneumothorax. The central airways are patent. Upper Abdomen: No acute abnormality. Musculoskeletal: Median sternotomy wires. Nondisplaced fractures of the anterior left 2nd-8th ribs. There is osteopenia with degenerative changes of the spine. Multilevel old appearing compression fractures of the thoracic spine. IMPRESSION: 1. Nondisplaced fractures of the anterior left 2nd-8th ribs. No pneumothorax. 2.  Aortic Atherosclerosis (ICD10-I70.0). Electronically Signed   By: Angus Bark M.D.   On: 12/01/2023 16:55     A/P: DAYN SPEIRS is an 73 y.o. male  HTN, HLD, GERD, CAD s/p CABG 2012 with subsequent DES to LCx and RCA in 2016, COPD, tobacco abuse, alcohol abuse, s/p syncopal  event 5/13. Has undergone CT Head, C-spine, and Chest/Tspine. Benign abdominal exam, no extremity injury/pain on exam; I do not think additional trauma imaging is needed at this time.    11mm intraparenchymal hemorrhage - hold plavix /ASA; nsgy (Dr. Larrie Po) consulted by Dr. Drury Geralds @ 17:20.  Preliminary recommendations include holding antiplatelet therapy and repeat CT scan in 8 to 12 hours- ordered for 0300.  Progressive level of care.  L rib fxs 2-8 - multimodal pain control, IS 10x/hr while awake, PRN O2, AM CXR EtOH use - ?CIWA? Syncope -appreciate TRH evaluation; echo, carotid ultrasound pending Hypertension History of CAD status post CABG, DES Aortic atherosclerosis Tobacco abuse GERD Multilevel remote thoracic compression fractures Dispo - Progressive/4NP    Adalberto Acton MD [facility], [facility]

## 2023-12-01 NOTE — ED Notes (Signed)
[facility] contacted 

## 2023-12-01 NOTE — ED Triage Notes (Signed)
 Pt fell last night at 1900. Injured left shoulder and left side of ribs. Pt thinks he broke his rib, unable to take a deep breath. Pt denies head injury, did lose consciousness, pt is on plavix .

## 2023-12-01 NOTE — ED Notes (Signed)
 Report given to Margrite at [facility]

## 2023-12-01 NOTE — ED Provider Notes (Signed)
 [place] [facility] DE[facility]MENT AT [facility] Provider Note   CSN: [id number] Arrival date & time: 12/01/23  1432     History  Chief Complaint  Patient presents with   Fall   Rib Injury    Derrick ANASTAS is a 73 y.o. male with PMH as listed below who presents with a fall, left-sided rib pain.  Got up from his chair last night at 1900 and "blacked out." Alba onto his left side. This has happened before and he wonders if his medication is making him dizzy. He didn't have any CP or SOB prior to the syncopal episode. No h/o seizures. He c/o severe pain in the L side of ribs, unable to take a deep breath. Doesn't think he hit his head, denies HA, but he does take plavix . Has so much junk in his lungs that he feels he can't cough up because it is far too painful to cough. Also c/o pain in his left upper back as well.   Past Medical History:  Diagnosis Date   Arthritis    B12 deficiency    Borderline   CAD (coronary artery disease)    a. s/p CABG 01/2011;  b. ETT Myoview  3/14:  Low risk;  c. 09/2014 Cath/PCI: LM nl, LAD 100p, LCX 25m/OM2 60-70p (2.75x28 Synergy DES), RCA dom, 89m (atherectomy, 3.5x24 Synergy DES), PDA nl, LIMA->LAD nl, VG->Diag nl, VG->OM 100, VG->RCA 100.// Myoview  06/23/2023: No ischemia or infarction, EF 65, low risk   Cataract    bilateral sx    COPD (chronic obstructive pulmonary disease) (HCC)    GERD (gastroesophageal reflux disease)    on meds   Hiatal hernia    History of echocardiogram    Echo 10/16: EF 50-55%, no RWMA, Gr 1 DD, mild MR, mild TR   History of kidney stones    Passed   HLD (hyperlipidemia)    on meds   HOH (hard of hearing)    Hypertension    on meds   Leg pain    ABIs 4/14:  R 1.2, L 1.2, TBIs normal   Peripheral neuropathy    From trauma   Reactive airway disease 03/18/2018   Tobacco abuse        Home Medications Prior to Admission medications   Medication Sig Start Date End Date Taking? Authorizing Provider   albuterol  (VENTOLIN  HFA) 108 (90 Base) MCG/ACT inhaler INHALE 1-2 PUFFS BY MOUTH INTO THE LUNGS EVERY 6 HOURS AS NEEDED FOR WHEEZING AND/OR FOR SHORTNESS OF BREATH 03/23/23   [place], [name], MD  Aspirin -Acetaminophen -Caffeine (GOODY HEADACHE PO) Take 1-2 packets by mouth daily as needed (pain, headache).    [provider]  atorvastatin  (LIPITOR ) 80 MG tablet TAKE 1 TABLET (80 MG TOTAL) BY MOUTH EVERY MORNING. 06/22/23   Arnoldo Lapping, MD  clopidogrel  (PLAVIX ) 75 MG tablet TAKE 1 TABLET BY MOUTH EVERY MORNING 06/22/23   Arnoldo Lapping, MD  DULoxetine  (CYMBALTA ) 60 MG capsule TAKE 1 CAPSULE BY MOUTH ONCE DAILY 07/26/23   [place], [name], MD  ezetimibe  (ZETIA ) 10 MG tablet TAKE 1 TABLET BY MOUTH DAILY 06/22/23   Arnoldo Lapping, MD  gabapentin  (NEURONTIN ) 300 MG capsule TAKE 1 CAPSULE BY MOUTH AT BEDTIME 10/20/23   [place], [name], MD  metoprolol  succinate (TOPROL -XL) 50 MG 24 hr tablet TAKE 1 TABLET BY MOUTH ONCE DAILY WITH A MEAL 06/22/23   Arnoldo Lapping, MD  nitroGLYCERIN  (NITROSTAT ) 0.4 MG SL tablet Place 1 tablet (0.4 mg total) under the tongue  every 5 (five) minutes as needed for chest pain. PLACE 1 TABLET UNDER THE TONGUE EVERY 5 MINUTES X 3 DOSES AS NEEDED FOR CHEST PAIN. 12/04/21   Swinyer, Leilani Punter, NP  pantoprazole  (PROTONIX ) 40 MG tablet TAKE 1 TABLET BY MOUTH DAILY 06/22/23   Arnoldo Lapping, MD      Allergies    Iodine, Iohexol , Shrimp [shellfish allergy], and Isordil  titradose [isosorbide  dinitrate]    Review of Systems   Review of Systems A 10 point review of systems was performed and is negative unless otherwise reported in HPI.  Physical Exam Updated Vital Signs BP (!) 132/90 (BP Location: Right Arm)   Pulse 94   Temp 98.1 F (36.7 C) (Oral)   Resp 18   Ht 5\' 7"  (1.702 m)   Wt 78 kg   SpO2 97%   BMI 26.94 kg/m  Physical Exam General: Normal appearing male, lying in bed.  HEENT:  NCAT, PERRLA, Sclera anicteric, MMM, trachea midline. No facial  trauma. Cardiology: RRR, no murmurs/rubs/gallops. +L sided rib TTP, in anterior midaxillary line around ribs 9-11, with no crepitus or flail chest Resp: Mild increased WOB and tachypnea. Shallow breaths with dense rales bilaterally.  Abd: Soft, non-tender, non-distended. No rebound tenderness or guarding.  GU: Deferred. MSK: No peripheral edema or signs of trauma. Extremities without deformity or TTP. No cyanosis or clubbing. Skin: warm, dry. No rashes or lesions. Back: No CVA tenderness. +L paraspinal thoracic TTP. NO midline C, T or L spine TTP/stepoffs or deformities Neuro: A&Ox4, CNs II-XII grossly intact. MAEs. Sensation grossly intact.  Psych: Normal mood and affect.   ED Results / Procedures / Treatments   Labs (all labs ordered are listed, but only abnormal results are displayed) Labs Reviewed  CBC WITH DIFFERENTIAL/PLATELET - Abnormal; Notable for the following components:      Result Value   WBC 10.8 (*)    Neutro Abs 8.3 (*)    All other components within normal limits  COMPREHENSIVE METABOLIC PANEL WITH GFR - Abnormal; Notable for the following components:   Glucose, Bld 104 (*)    Creatinine, Ser 0.48 (*)    Total Protein 8.2 (*)    Total Bilirubin 1.8 (*)    All other components within normal limits  RESP PANEL BY RT-PCR (RSV, FLU A&B, COVID)  RVPGX2  RAPID URINE DRUG SCREEN, HOSP PERFORMED  URINALYSIS, ROUTINE W REFLEX MICROSCOPIC  CBC  BASIC METABOLIC PANEL WITH GFR  TROPONIN I (HIGH SENSITIVITY)  TROPONIN I (HIGH SENSITIVITY)    EKG EKG Interpretation Date/Time:  Wednesday Dec 01 2023 15:58:52 EDT Ventricular Rate:  83 PR Interval:  119 QRS Duration:  94 QT Interval:  377 QTC Calculation: 443 R Axis:   68  Text Interpretation: Sinus rhythm Borderline short PR interval RSR' in V1 or V2, right VCD or RVH Confirmed by Annita Kindle [phone]) on 12/01/2023 4:00:35 PM  Radiology CT Head Wo Contrast Result Date: 12/01/2023 CLINICAL DATA:  Fall with head trauma  EXAM: CT HEAD WITHOUT CONTRAST CT CERVICAL SPINE WITHOUT CONTRAST TECHNIQUE: Multidetector CT imaging of the head and cervical spine was performed following the standard protocol without intravenous contrast. Multiplanar CT image reconstructions of the cervical spine were also generated. RADIATION DOSE REDUCTION: This exam was performed according to the departmental dose-optimization program which includes automated exposure control, adjustment of the mA and/or kV according to patient size and/or use of iterative reconstruction technique. COMPARISON:  None Available. FINDINGS: CT HEAD FINDINGS Brain: Negative for intracranial mass. Atrophy and  moderate severe chronic small vessel ischemic changes of the white matter. Prominent ventricles felt secondary to atrophy. Focal intraparenchymal hemorrhage at the left frontal vertex, this measures 1.1 x 0.8 x 0.8 cm, estimated volume of less than 1 mL. Mild surrounding hypodense edema. No significant mass effect or midline shift. Vascular: No hyperdense vessels.  Carotid vascular calcifications. Skull: No definitive fracture Sinuses/Orbits: Moderate mucosal thickening in the sinuses Other: Traumatic Brain Injury Risk Stratification Skull Fracture: No - Low/mBIG 1 Subdural Hematoma (SDH): No - Low Subarachnoid Hemorrhage [facility]): No Epidural Hematoma (EDH): No - Low/mBIG 1 Cerebral contusion, intra-axial, intraparenchymal Hemorrhage (IPH): No Intraventricular Hemorrhage (IVH): No - Low/mBIG 1 Midline Shift > 1mm or Edema/effacement of sulci/vents: No - Low/mBIG 1 ---------------------------------------------------- CT CERVICAL SPINE FINDINGS Alignment: Trace anterolisthesis C2 on C3. Facet alignment is within normal limits. Skull base and vertebrae: No acute fracture. No primary bone lesion or focal pathologic process. Soft tissues and spinal canal: No prevertebral fluid or swelling. No visible canal hematoma. Disc levels: Mild disc space narrowing at C3-C4. Multilevel mild  facet degenerative changes. Upper chest: See separately dictated chest CT Other: None IMPRESSION: 1. 1.1 cm intraparenchymal hemorrhage at the left frontal vertex with mild surrounding edema. No significant mass effect or midline shift. 2. Atrophy and chronic small vessel ischemic changes of the white matter. 3. No CT evidence for acute osseous abnormality of the cervical spine. Critical Value/emergent results were called by telephone at the time of interpretation on 12/01/2023 at 5:10 pm to provider Wessley Emert , who verbally acknowledged these results. Electronically Signed   By: Esmeralda Hedge M.D.   On: 12/01/2023 17:34   CT Cervical Spine Wo Contrast Result Date: 12/01/2023 CLINICAL DATA:  Fall with head trauma EXAM: CT HEAD WITHOUT CONTRAST CT CERVICAL SPINE WITHOUT CONTRAST TECHNIQUE: Multidetector CT imaging of the head and cervical spine was performed following the standard protocol without intravenous contrast. Multiplanar CT image reconstructions of the cervical spine were also generated. RADIATION DOSE REDUCTION: This exam was performed according to the departmental dose-optimization program which includes automated exposure control, adjustment of the mA and/or kV according to patient size and/or use of iterative reconstruction technique. COMPARISON:  None Available. FINDINGS: CT HEAD FINDINGS Brain: Negative for intracranial mass. Atrophy and moderate severe chronic small vessel ischemic changes of the white matter. Prominent ventricles felt secondary to atrophy. Focal intraparenchymal hemorrhage at the left frontal vertex, this measures 1.1 x 0.8 x 0.8 cm, estimated volume of less than 1 mL. Mild surrounding hypodense edema. No significant mass effect or midline shift. Vascular: No hyperdense vessels.  Carotid vascular calcifications. Skull: No definitive fracture Sinuses/Orbits: Moderate mucosal thickening in the sinuses Other: Traumatic Brain Injury Risk Stratification Skull Fracture: No - Low/mBIG  1 Subdural Hematoma (SDH): No - Low Subarachnoid Hemorrhage [facility]): No Epidural Hematoma (EDH): No - Low/mBIG 1 Cerebral contusion, intra-axial, intraparenchymal Hemorrhage (IPH): No Intraventricular Hemorrhage (IVH): No - Low/mBIG 1 Midline Shift > 1mm or Edema/effacement of sulci/vents: No - Low/mBIG 1 ---------------------------------------------------- CT CERVICAL SPINE FINDINGS Alignment: Trace anterolisthesis C2 on C3. Facet alignment is within normal limits. Skull base and vertebrae: No acute fracture. No primary bone lesion or focal pathologic process. Soft tissues and spinal canal: No prevertebral fluid or swelling. No visible canal hematoma. Disc levels: Mild disc space narrowing at C3-C4. Multilevel mild facet degenerative changes. Upper chest: See separately dictated chest CT Other: None IMPRESSION: 1. 1.1 cm intraparenchymal hemorrhage at the left frontal vertex with mild surrounding edema. No significant mass effect  or midline shift. 2. Atrophy and chronic small vessel ischemic changes of the white matter. 3. No CT evidence for acute osseous abnormality of the cervical spine. Critical Value/emergent results were called by telephone at the time of interpretation on 12/01/2023 at 5:10 pm to provider Aanyah Loa , who verbally acknowledged these results. Electronically Signed   By: Esmeralda Hedge M.D.   On: 12/01/2023 17:34   CT Thoracic Spine Wo Contrast Result Date: 12/01/2023 CLINICAL DATA:  Back trauma. EXAM: CT THORACIC SPINE WITHOUT CONTRAST TECHNIQUE: Multidetector CT images of the thoracic were obtained using the standard protocol without intravenous contrast. RADIATION DOSE REDUCTION: This exam was performed according to the departmental dose-optimization program which includes automated exposure control, adjustment of the mA and/or kV according to patient size and/or use of iterative reconstruction technique. COMPARISON:  Chest CT dated 08/08/2021. FINDINGS: Alignment: No acute subluxation.  Vertebrae: No acute fracture. The bones are osteopenic. Multilevel old compression fractures most prominent at T6 and T8. Paraspinal and other soft tissues: Negative. Disc levels: Multilevel degenerative changes. IMPRESSION: 1. No acute/traumatic thoracic spine pathology. 2. Multilevel old compression fractures. Electronically Signed   By: Angus Bark M.D.   On: 12/01/2023 16:58   CT Chest Wo Contrast Result Date: 12/01/2023 CLINICAL DATA:  Chest trauma.  Fall. EXAM: CT CHEST WITHOUT CONTRAST TECHNIQUE: Multidetector CT imaging of the chest was performed following the standard protocol without IV contrast. RADIATION DOSE REDUCTION: This exam was performed according to the departmental dose-optimization program which includes automated exposure control, adjustment of the mA and/or kV according to patient size and/or use of iterative reconstruction technique. COMPARISON:  Chest CT dated 08/08/2021. FINDINGS: Evaluation of this exam is limited in the absence of intravenous contrast. Cardiovascular: There is no cardiomegaly or pericardial effusion. There is coronary vascular calcification and postsurgical changes of CABG. Mild atherosclerotic calcification of the thoracic aorta. No aneurysmal dilatation. The central pulmonary arteries are grossly unremarkable. Mediastinum/Nodes: No hilar or mediastinal adenopathy. Small hiatal hernia. The esophagus and the thyroid  gland are grossly unremarkable. No mediastinal fluid collection. Lungs/Pleura: Left lung base subpleural atelectasis. No focal consolidation, pleural effusion, or pneumothorax. The central airways are patent. Upper Abdomen: No acute abnormality. Musculoskeletal: Median sternotomy wires. Nondisplaced fractures of the anterior left 2nd-8th ribs. There is osteopenia with degenerative changes of the spine. Multilevel old appearing compression fractures of the thoracic spine. IMPRESSION: 1. Nondisplaced fractures of the anterior left 2nd-8th ribs. No  pneumothorax. 2.  Aortic Atherosclerosis (ICD10-I70.0). Electronically Signed   By: Angus Bark M.D.   On: 12/01/2023 16:55    Procedures .Critical Care  Performed by: Merdis Stalling, MD Authorized by: Merdis Stalling, MD   Critical care provider statement:    Critical care time (minutes):  50   Critical care was necessary to treat or prevent imminent or life-threatening deterioration of the following conditions:  Respiratory failure and trauma   Critical care was time spent personally by me on the following activities:  Development of treatment plan with patient or surrogate, discussions with consultants, evaluation of patient's response to treatment, examination of patient, ordering and review of laboratory studies, ordering and review of radiographic studies, ordering and performing treatments and interventions, pulse oximetry, re-evaluation of patient's condition, review of old charts and obtaining history from patient or surrogate   Care discussed with: accepting provider at another facility       Medications Ordered in ED Medications  hydrALAZINE  (APRESOLINE ) injection 5 mg (has no administration in time range)  HYDROmorphone  (  DILAUDID ) injection 0.5 mg (0.5 mg Intravenous Given 12/01/23 1613)  ondansetron  (ZOFRAN ) injection 4 mg (4 mg Intravenous Given 12/01/23 1656)  HYDROmorphone  (DILAUDID ) injection 0.5 mg (0.5 mg Intravenous Given 12/01/23 1705)  HYDROmorphone  (DILAUDID ) injection 0.5 mg (0.5 mg Intravenous Given 12/01/23 1815)    ED Course/ Medical Decision Making/ A&P                          Medical Decision Making Amount and/or Complexity of Data Reviewed Labs: ordered. Decision-making details documented in ED Course. Radiology: ordered. Decision-making details documented in ED Course.  Risk Prescription drug management. Decision regarding hospitalization.    This patient presents to the ED for concern of syncope/collapse, L rib pain, L upper back pain, this  involves an extensive number of treatment options, and is a complaint that carries with it a high risk of complications and morbidity.  I considered the following differential and admission for this acute, potentially life threatening condition.   MDM:    DDX for trauma includes but is not limited to:  -Head Injury such as skull fx or ICH - reports no headache but did syncopize, will get CTH -Chest Injury - including rib fx, hemo/pneumothorax, organ injury -Vertebral injury - has left upper back pain, could have thoracic injury. Will also get CT C_spine. -Fractures  -Patient is tachypneic, with significant rales, splinting from rib fx's L 2-8, nondisplaced. Ordered IV dialudid and IV zofran  as well as incentive spirometry. Consulted to trauma. -Also found to have L IPH, 1.1 cm. Unclear if traumatic or could have been spontaneous and contributed to syncope. Does take plavix . Consulted to NSGY.  From a syncopal perspective, no CP; EKG w/o signs of ischemia, trop neg. No significant leg swelling or pulm edema to indicate HF. No report of hematochezia/melena, and Hgb stable. No hypo/hyperglycemia, electrolyte derangements. No h/o seizures or reported loss of bladder.  Wonders if his medication makes him dizzy, seems to have occurred after standing from chair, consider orthostatic syncope. Will avoid orthostatic vitals now w/ rib fx's and IPH.    Clinical Course as of 12/01/23 1823  Wed Dec 01, 2023  1658 CT Chest Wo Contrast 1. Nondisplaced fractures of the anterior left 2nd-8th ribs. No pneumothorax. 2.  Aortic Atherosclerosis (ICD10-I70.0).   [HN]  1659 Comprehensive metabolic panel(!) Unremarkable in the context of this patient's presentation  [HN]  1659 Consulted to trauma [HN]  1704 CT Thoracic Spine Wo Contrast 1. No acute/traumatic thoracic spine pathology. 2. Multilevel old compression fractures.   [HN]  1704 Dr. Camilo Cella from trauma surgery who recommends admission to medicine at  [facility] cone. [HN]  1725 Received call from radiologist about small 11 cm intraparenchymal hemorrhage without midline shift on CTH. She is finalizing read now. Consulted to NSGY. Dr. Camilo Cella and [name] aware.  [HN]  1755 CT Head Wo Contrast 1. 1.1 cm intraparenchymal hemorrhage at the left frontal vertex with mild surrounding edema. No significant mass effect or midline shift. 2. Atrophy and chronic small vessel ischemic changes of the white matter. 3. No CT evidence for acute osseous abnormality of the cervical spine.  [HN]  1801 Patient and son informed of imaging findings. Apparently patient's wife passed away recently from brain bleed requiring EVD. Tried to reassure patient/son.   D/w Jacqlyn Matas w/ NSGY who recommends BP parameters, orders placed for PRN hydralazine ; repeat head CT in 8-12 hours; and holding plavix . Hospitalist/Trauma made aware.   Patient receiving dilaudid , now  requiring 3L [place]. Normal mental status.  [HN]    Clinical Course User Index [HN] Merdis Stalling, MD    Labs: I Ordered, and personally interpreted labs.  The pertinent results include:  those listed above  Imaging Studies ordered: I ordered imaging studies including CT chest wo contrast, CTH, CT C/T spine I independently visualized and interpreted imaging. I agree with the radiologist interpretation  Additional history obtained from chart review.    Cardiac Monitoring: The patient was maintained on a cardiac monitor.  I personally viewed and interpreted the cardiac monitored which showed an underlying rhythm of: NSR  Reevaluation: After the interventions noted above, I reevaluated the patient and found that they have :improved  Social Determinants of Health: Lives independently  Disposition:  Patient admitted to trauma surgery at [facility] with hospitalist and NSGY following.   Co morbidities that complicate the patient evaluation  Past Medical History:  Diagnosis Date   Arthritis    B12  deficiency    Borderline   CAD (coronary artery disease)    a. s/p CABG 01/2011;  b. ETT Myoview  3/14:  Low risk;  c. 09/2014 Cath/PCI: LM nl, LAD 100p, LCX 56m/OM2 60-70p (2.75x28 Synergy DES), RCA dom, 16m (atherectomy, 3.5x24 Synergy DES), PDA nl, LIMA->LAD nl, VG->Diag nl, VG->OM 100, VG->RCA 100.// Myoview  06/23/2023: No ischemia or infarction, EF 65, low risk   Cataract    bilateral sx    COPD (chronic obstructive pulmonary disease) (HCC)    GERD (gastroesophageal reflux disease)    on meds   Hiatal hernia    History of echocardiogram    Echo 10/16: EF 50-55%, no RWMA, Gr 1 DD, mild MR, mild TR   History of kidney stones    Passed   HLD (hyperlipidemia)    on meds   HOH (hard of hearing)    Hypertension    on meds   Leg pain    ABIs 4/14:  R 1.2, L 1.2, TBIs normal   Peripheral neuropathy    From trauma   Reactive airway disease 03/18/2018   Tobacco abuse      Medicines Meds ordered this encounter  Medications   DISCONTD: morphine  (PF) 4 MG/ML injection 4 mg   HYDROmorphone  (DILAUDID ) injection 0.5 mg   ondansetron  (ZOFRAN ) injection 4 mg   HYDROmorphone  (DILAUDID ) injection 0.5 mg   hydrALAZINE  (APRESOLINE ) injection 5 mg   HYDROmorphone  (DILAUDID ) injection 0.5 mg    I have reviewed the patients home medicines and have made adjustments as needed  Problem List / ED Course: Problem List Items Addressed This Visit   None Visit Diagnoses       Syncope and collapse    -  Primary     Closed fracture of multiple ribs of left side, initial encounter         Intraparenchymal hemorrhage of brain [facility])                       This note was created using dictation software, which may contain spelling or grammatical errors.    Merdis Stalling, MD 12/01/23 [phone]

## 2023-12-01 NOTE — ED Notes (Signed)
 [facility] called nurse to verify room number

## 2023-12-02 ENCOUNTER — Inpatient Hospital Stay (HOSPITAL_COMMUNITY)

## 2023-12-02 ENCOUNTER — Encounter (HOSPITAL_COMMUNITY)

## 2023-12-02 DIAGNOSIS — R55 Syncope and collapse: Secondary | ICD-10-CM

## 2023-12-02 LAB — BASIC METABOLIC PANEL WITH GFR
Anion gap: 8 (ref 5–15)
BUN: 11 mg/dL (ref 8–23)
CO2: 24 mmol/L (ref 22–32)
Calcium: 9 mg/dL (ref 8.9–10.3)
Chloride: 103 mmol/L (ref 98–111)
Creatinine, Ser: 0.6 mg/dL — ABNORMAL LOW (ref 0.61–1.24)
GFR, Estimated: >60 mL/min (ref >60)
Glucose, Bld: 108 mg/dL — ABNORMAL HIGH (ref 70–99)
Potassium: 3.6 mmol/L (ref 3.5–5.1)
Sodium: 135 mmol/L (ref 135–145)

## 2023-12-02 LAB — ECHOCARDIOGRAM COMPLETE
AR max vel: 3.22 cm2
AV Area VTI: 2.53 cm2
AV Area mean vel: 2.39 cm2
AV Mean grad: 3 mmHg
AV Peak grad: 6.2 mmHg
Ao pk vel: 1.24 m/s
Area-P 1/2: 4.57 cm2
Calc EF: 65.1 %
Height: 67 in
S' Lateral: 2.9 cm
Single Plane A2C EF: 70.3 %
Single Plane A4C EF: 60.1 %
Weight: 2752 [oz_av]

## 2023-12-02 LAB — CBC
HCT: 38.2 % — ABNORMAL LOW (ref 39.0–52.0)
Hemoglobin: 13.6 g/dL (ref 13.0–17.0)
MCH: 32.7 pg (ref 26.0–34.0)
MCHC: 35.6 g/dL (ref 30.0–36.0)
MCV: 91.8 fL (ref 80.0–100.0)
Platelets: 183 10*3/uL (ref 150–400)
RBC: 4.16 MIL/uL — ABNORMAL LOW (ref 4.22–5.81)
RDW: 13.6 % (ref 11.5–15.5)
WBC: 9.3 10*3/uL (ref 4.0–10.5)
nRBC: 0 % (ref 0.0–0.2)

## 2023-12-02 MED ORDER — DM-GUAIFENESIN ER 30-600 MG PO TB12
1.0000 | ORAL_TABLET | Freq: Two times a day (BID) | ORAL | Status: DC
  Administered 2023-12-02 – 2023-12-05 (×7): 1 via ORAL
  Filled 2023-12-02 (×7): qty 1

## 2023-12-02 MED ORDER — THIAMINE MONONITRATE 100 MG PO TABS
100.0000 mg | ORAL_TABLET | Freq: Every day | ORAL | Status: DC
  Administered 2023-12-02 – 2023-12-06 (×5): 100 mg via ORAL
  Filled 2023-12-02 (×5): qty 1

## 2023-12-02 MED ORDER — THIAMINE HCL 100 MG/ML IJ SOLN: 100.0000 mg | Freq: Every day | INTRAMUSCULAR | Status: DC

## 2023-12-02 MED ORDER — IPRATROPIUM-ALBUTEROL 0.5-2.5 (3) MG/3ML IN SOLN
3.0000 mL | Freq: Four times a day (QID) | RESPIRATORY_TRACT | Status: DC
  Administered 2023-12-02: 3 mL via RESPIRATORY_TRACT
  Filled 2023-12-02: qty 3

## 2023-12-02 MED ORDER — BENZONATATE 100 MG PO CAPS
100.0000 mg | ORAL_CAPSULE | Freq: Three times a day (TID) | ORAL | Status: DC
  Administered 2023-12-02 – 2023-12-06 (×12): 100 mg via ORAL
  Filled 2023-12-02 (×14): qty 1

## 2023-12-02 MED ORDER — METOPROLOL SUCCINATE ER 25 MG PO TB24
25.0000 mg | ORAL_TABLET | Freq: Every day | ORAL | Status: DC
  Administered 2023-12-03 – 2023-12-06 (×4): 25 mg via ORAL
  Filled 2023-12-02 (×4): qty 1

## 2023-12-02 MED ORDER — LORAZEPAM 1 MG PO TABS: 1.0000 mg | ORAL_TABLET | ORAL | Status: AC | PRN

## 2023-12-02 MED ORDER — ADULT MULTIVITAMIN W/MINERALS CH
1.0000 | ORAL_TABLET | Freq: Every day | ORAL | Status: DC
  Administered 2023-12-02 – 2023-12-06 (×5): 1 via ORAL
  Filled 2023-12-02 (×5): qty 1

## 2023-12-02 MED ORDER — CLOPIDOGREL BISULFATE 75 MG PO TABS
75.0000 mg | ORAL_TABLET | Freq: Every day | ORAL | Status: DC
  Administered 2023-12-03 – 2023-12-06 (×4): 75 mg via ORAL
  Filled 2023-12-02 (×4): qty 1

## 2023-12-02 MED ORDER — FOLIC ACID 1 MG PO TABS
1.0000 mg | ORAL_TABLET | Freq: Every day | ORAL | Status: DC
  Administered 2023-12-02 – 2023-12-06 (×5): 1 mg via ORAL
  Filled 2023-12-02 (×5): qty 1

## 2023-12-02 MED ORDER — IPRATROPIUM-ALBUTEROL 0.5-2.5 (3) MG/3ML IN SOLN
3.0000 mL | Freq: Two times a day (BID) | RESPIRATORY_TRACT | Status: DC
  Administered 2023-12-02 – 2023-12-06 (×8): 3 mL via RESPIRATORY_TRACT
  Filled 2023-12-02 (×8): qty 3

## 2023-12-02 NOTE — Care Management Important Message (Signed)
 Important Message  Patient Details  Name: Derrick [place] MRN: [id number] Date of Birth: Oct 27, 1950   Important Message Given:  Yes - Medicare IM     Felix Host 12/02/2023, 10:41 AM

## 2023-12-02 NOTE — Evaluation (Signed)
 Speech Language Pathology Evaluation Patient Details Name: ISSAK Stokes MRN: [id number] DOB: 03/11/1951 Today's Date: 12/02/2023 Time: 2440-1027 SLP Time Calculation (min) (ACUTE ONLY): 17 min  Problem List:  Patient Active Problem List   Diagnosis Date Noted   Thoracic spine fracture (HCC) 12/01/2023   Syncope 12/01/2023   Acute cholecystitis 09/18/2023   Right upper quadrant abdominal pain 09/18/2023   Alcohol abuse 09/18/2023   Dysfunction of left eustachian tube 08/24/2022   Balance problem 06/23/2022   Atherosclerosis of aorta (HCC) 06/09/2021   Essential (primary) hypertension 06/09/2021   Regurgitation of food 02/18/2020   Barrett's esophagus without dysplasia 02/18/2020   Bloating 02/18/2020   History of adenomatous polyp of colon 02/18/2020   Change in bowel habits 02/18/2020   Hiatal hernia 02/18/2020   Claudication (HCC) 09/18/2019   Coronary artery disease involving native coronary artery of native heart with angina pectoris (HCC) 09/18/2019   Chest pain wtih stable CAD at cath.   03/18/2018   Reactive airway disease 03/18/2018   Coronary artery disease with unstable angina pectoris (HCC) 03/16/2018   COPD exacerbation (HCC) 09/08/2016   B12 deficiency 06/08/2016   GERD (gastroesophageal reflux disease) 06/08/2016   Chronic bilateral low back pain with sciatica 06/08/2016   Peripheral neuropathic pain 06/08/2016   CAD (coronary artery disease), native coronary artery 09/17/2014   Unstable angina (HCC) 09/16/2014   Tobacco abuse 01/03/2014   S/P coronary artery bypass graft x 5 03/06/2011   Hyperlipidemia 03/06/2011   BARRETTS ESOPHAGUS 11/05/2008   HIATAL HERNIA 11/05/2008   DIVERTICULOSIS, COLON 11/05/2008   History of colonic polyps 11/05/2008   GASTRITIS 10/22/2008   Anxiety state 10/12/2008   EARLY SATIETY 10/12/2008   CHANGE IN BOWELS 10/12/2008   ABDOMINAL PAIN -GENERALIZED 10/12/2008   ABDOMINAL PAIN, LEFT UPPER QUADRANT 10/01/2008   Past  Medical History:  Past Medical History:  Diagnosis Date   Arthritis    B12 deficiency    Borderline   CAD (coronary artery disease)    a. s/p CABG 01/2011;  b. ETT Myoview  3/14:  Low risk;  c. 09/2014 Cath/PCI: LM nl, LAD 100p, LCX 44m/OM2 60-70p (2.75x28 Synergy DES), RCA dom, 32m (atherectomy, 3.5x24 Synergy DES), PDA nl, LIMA->LAD nl, VG->Diag nl, VG->OM 100, VG->RCA 100.// Myoview  06/23/2023: No ischemia or infarction, EF 65, low risk   Cataract    bilateral sx    COPD (chronic obstructive pulmonary disease) (HCC)    GERD (gastroesophageal reflux disease)    on meds   Hiatal hernia    History of echocardiogram    Echo 10/16: EF 50-55%, no RWMA, Gr 1 DD, mild MR, mild TR   History of kidney stones    Passed   HLD (hyperlipidemia)    on meds   HOH (hard of hearing)    Hypertension    on meds   Leg pain    ABIs 4/14:  R 1.2, L 1.2, TBIs normal   Peripheral neuropathy    From trauma   Reactive airway disease 03/18/2018   Tobacco abuse    Past Surgical History:  Past Surgical History:  Procedure Laterality Date   CHOLECYSTECTOMY N/A 09/19/2023   Procedure: LAPAROSCOPIC CHOLECYSTECTOMY;  Surgeon: Cannon Champion, MD;  Location: [facility] OR;  Service: General;  Laterality: N/A;   COLONOSCOPY  03/09/2020   Neg   COLONOSCOPY WITH PROPOFOL  N/A 05/08/2020   Procedure: COLONOSCOPY WITH PROPOFOL ;  Surgeon: Normie Becton., MD;  Location: [facility] ENDOSCOPY;  Service: Gastroenterology;  Laterality: N/A;   CORONARY  ARTERY BYPASS GRAFT  2013   x5   CORONARY PRESSURE/FFR STUDY N/A 03/17/2018   Procedure: INTRAVASCULAR PRESSURE WIRE/FFR STUDY;  Surgeon: Avanell Leigh, MD;  Location: [facility] INVASIVE CV LAB;  Service: Cardiovascular;  Laterality: N/A;   ENDOSCOPIC MUCOSAL RESECTION N/A 05/08/2020   Procedure: ENDOSCOPIC MUCOSAL RESECTION;  Surgeon: Brice Campi Albino Alu., MD;  Location: [facility] ENDOSCOPY;  Service: Gastroenterology;  Laterality: N/A;   EPIDURAL STEROIDS     EYE SURGERY Bilateral     Cataract   HEMOSTASIS CLIP PLACEMENT  05/08/2020   Procedure: HEMOSTASIS CLIP PLACEMENT;  Surgeon: Normie Becton., MD;  Location: [facility] ENDOSCOPY;  Service: Gastroenterology;;   HEMOSTASIS CONTROL  05/08/2020   Procedure: HEMOSTASIS CONTROL;  Surgeon: Normie Becton., MD;  Location: [facility] ENDOSCOPY;  Service: Gastroenterology;;   LEFT HEART CATH AND CORONARY ANGIOGRAPHY N/A 03/17/2018   Procedure: LEFT HEART CATH AND CORONARY ANGIOGRAPHY;  Surgeon: Avanell Leigh, MD;  Location: [facility] INVASIVE CV LAB;  Service: Cardiovascular;  Laterality: N/A;   LEFT HEART CATH AND CORS/GRAFTS ANGIOGRAPHY N/A 10/16/2020   Procedure: LEFT HEART CATH AND CORS/GRAFTS ANGIOGRAPHY;  Surgeon: Arnoldo Lapping, MD;  Location: [facility] INVASIVE CV LAB;  Service: Cardiovascular;  Laterality: N/A;   LEFT HEART CATHETERIZATION WITH CORONARY/GRAFT ANGIOGRAM N/A 09/18/2014   Procedure: LEFT HEART CATHETERIZATION WITH Estella Helling;  Surgeon: Lucendia Rusk, MD;  Location: [facility] CATH LAB;  Service: Cardiovascular;  Laterality: N/A;   PERCUTANEOUS CORONARY ROTOBLATOR INTERVENTION (PCI-R) N/A 09/20/2014   Procedure: PERCUTANEOUS CORONARY ROTOBLATOR INTERVENTION (PCI-R);  Surgeon: Lucendia Rusk, MD;  Location: [facility] CATH LAB;  Service: Cardiovascular;  Laterality: N/A;   POLYPECTOMY  02/28/2020   6 polyps/tics/hems   POLYPECTOMY  05/08/2020   Procedure: POLYPECTOMY;  Surgeon: Mansouraty, Albino Alu., MD;  Location: [facility] ENDOSCOPY;  Service: Gastroenterology;;   Daryle Eon  05/08/2020   Procedure: Daryle Eon;  Surgeon: Mansouraty, Albino Alu., MD;  Location: [facility] ENDOSCOPY;  Service: Gastroenterology;;   SUBMUCOSAL LIFTING INJECTION  05/08/2020   Procedure: SUBMUCOSAL LIFTING INJECTION;  Surgeon: Normie Becton., MD;  Location: [facility] ENDOSCOPY;  Service: Gastroenterology;;   HPI:  Derrick Stokes is an 73 y.o. male hx of hypertension, hyperlipidemia, CAD s/p CABG 2012 with subsequent DES to LCx and RCA  in 2016, COPD, tobacco abuse, alcohol abuse, GERD -- presented to [facility] ED following syncopal event - stood up from chair and subsequently lost consciousness, falling to ground. Awoke with severe left sided chest wall pain suspecting he had multiple broken ribs. CT Head revealed: 1 cm intraparenchymal hemorrhage at the left frontal vertex with mild surrounding edema.   Assessment / Plan / Recommendation Clinical Impression  Patient presents with cognitive deficits in the areas of attention, awareness, memory, and problem solving that were likely present prior to fall/admission as endorsed by patient, though family not present to corroborrate. Pateint endorses changes to short term memory specifically over the course of the past few years with resultant difficulty managing schedule. At baseline, son assists with appointment management, driving, and shopping. Patient recieves medications pre-sorted through pharmacy program and manages medications well via this system. In comparison to description of daily function prior to admission, suspect patient is largely at baseline level of function. Patient not in need of SLP services acutely, but would likely benefit from outpatient speech therapy services to address ongoing changes to cognition and resultant difficulty with iADL management. Patient in agreement with plan.    SLP Assessment  SLP Recommendation/Assessment: All further Speech Lanaguage Pathology  needs can be  addressed in the next venue of care SLP Visit Diagnosis: Cognitive communication deficit (R41.841)    Recommendations for follow up therapy are one component of a multi-disciplinary discharge planning process, led by the attending physician.  Recommendations may be updated based on patient status, additional functional criteria and insurance authorization.    Follow Up Recommendations  Outpatient SLP    Assistance Recommended at Discharge  Intermittent Supervision/Assistance  Functional  Status Assessment Patient has had a recent decline in their functional status and/or demonstrates limited ability to make significant improvements in function in a reasonable and predictable amount of time  Frequency and Duration           SLP Evaluation Cognition  Overall Cognitive Status: History of cognitive impairments - at baseline Arousal/Alertness: Awake/alert Orientation Level: Oriented X4 Year: 2025 Month: May Day of Week: Incorrect Attention: Sustained Sustained Attention: Impaired Sustained Attention Impairment: Verbal complex;Functional complex Memory: Impaired Memory Impairment: Storage deficit;Retrieval deficit;Decreased recall of new information Awareness: Impaired Awareness Impairment: Intellectual impairment Problem Solving: Impaired Problem Solving Impairment: Verbal complex;Functional complex Executive Function: Self Monitoring;Self Correcting Self Monitoring: Impaired Self Monitoring Impairment: Verbal complex;Functional complex Self Correcting: Impaired Self Correcting Impairment: Verbal complex;Functional complex Safety/Judgment: Appears intact       Comprehension  Auditory Comprehension Overall Auditory Comprehension: Appears within functional limits for tasks assessed Visual Recognition/Discrimination Discrimination: Not tested Reading Comprehension Reading Status: Not tested    Expression Expression Primary Mode of Expression: Verbal Verbal Expression Overall Verbal Expression: Appears within functional limits for tasks assessed Initiation: No impairment Written Expression Written Expression: Not tested   Oral / Motor  Oral Motor/Sensory Function Overall Oral Motor/Sensory Function: Within functional limits Motor Speech Overall Motor Speech: Appears within functional limits for tasks assessed Intelligibility: Intelligible           Dorla Gartner, M.A., CCC-SLP  Jia Dottavio A [place] 12/02/2023, 3:31 PM

## 2023-12-02 NOTE — Consult Note (Signed)
 Reason for Consult: Left frontal contusion Referring Physician: Dr. Myrtis Stokes is an 73 y.o. male.  HPI: The patient is a 73 year old white male on aspirin  and Plavix  who had a syncopal event yesterday.  He was worked up with a head CT which demonstrated a small left frontal contusion.  The patient was admitted by Dr. Rudine Stokes for further workup and management of his syncopal event and rib fractures.  A neurosurgical consultation was requested.  Presently the patient is alert and pleasant.  He denies headaches.  He complains of rib pain.  He denies neck pain.  Past Medical History:  Diagnosis Date   Arthritis    B12 deficiency    Borderline   CAD (coronary artery disease)    a. s/p CABG 01/2011;  b. ETT Myoview  3/14:  Low risk;  c. 09/2014 Cath/PCI: LM nl, LAD 100p, LCX 24m/OM2 60-70p (2.75x28 Synergy DES), RCA dom, 49m (atherectomy, 3.5x24 Synergy DES), PDA nl, LIMA->LAD nl, VG->Diag nl, VG->OM 100, VG->RCA 100.// Myoview  06/23/2023: No ischemia or infarction, EF 65, low risk   Cataract    bilateral sx    COPD (chronic obstructive pulmonary disease) (HCC)    GERD (gastroesophageal reflux disease)    on meds   Hiatal hernia    History of echocardiogram    Echo 10/16: EF 50-55%, no RWMA, Gr 1 DD, mild MR, mild TR   History of kidney stones    Passed   HLD (hyperlipidemia)    on meds   HOH (hard of hearing)    Hypertension    on meds   Leg pain    ABIs 4/14:  R 1.2, L 1.2, TBIs normal   Peripheral neuropathy    From trauma   Reactive airway disease 03/18/2018   Tobacco abuse     Past Surgical History:  Procedure Laterality Date   CHOLECYSTECTOMY N/A 09/19/2023   Procedure: LAPAROSCOPIC CHOLECYSTECTOMY;  Surgeon: Derrick Stokes;  Location: [facility] OR;  Service: General;  Laterality: N/A;   COLONOSCOPY  03/09/2020   Neg   COLONOSCOPY WITH PROPOFOL  N/A 05/08/2020   Procedure: COLONOSCOPY WITH PROPOFOL ;  Surgeon: Derrick Stokes., Stokes;  Location: [facility]  ENDOSCOPY;  Service: Gastroenterology;  Laterality: N/A;   CORONARY ARTERY BYPASS GRAFT  2013   x5   CORONARY PRESSURE/FFR STUDY N/A 03/17/2018   Procedure: INTRAVASCULAR PRESSURE WIRE/FFR STUDY;  Surgeon: Derrick Leigh, Stokes;  Location: [facility] INVASIVE CV LAB;  Service: Cardiovascular;  Laterality: N/A;   ENDOSCOPIC MUCOSAL RESECTION N/A 05/08/2020   Procedure: ENDOSCOPIC MUCOSAL RESECTION;  Surgeon: Derrick Campi Albino Alu., Stokes;  Location: [facility] ENDOSCOPY;  Service: Gastroenterology;  Laterality: N/A;   EPIDURAL STEROIDS     EYE SURGERY Bilateral    Cataract   HEMOSTASIS CLIP PLACEMENT  05/08/2020   Procedure: HEMOSTASIS CLIP PLACEMENT;  Surgeon: Derrick Stokes., Stokes;  Location: [facility] ENDOSCOPY;  Service: Gastroenterology;;   HEMOSTASIS CONTROL  05/08/2020   Procedure: HEMOSTASIS CONTROL;  Surgeon: Derrick Stokes., Stokes;  Location: [facility] ENDOSCOPY;  Service: Gastroenterology;;   LEFT HEART CATH AND CORONARY ANGIOGRAPHY N/A 03/17/2018   Procedure: LEFT HEART CATH AND CORONARY ANGIOGRAPHY;  Surgeon: Derrick Leigh, Stokes;  Location: [facility] INVASIVE CV LAB;  Service: Cardiovascular;  Laterality: N/A;   LEFT HEART CATH AND CORS/GRAFTS ANGIOGRAPHY N/A 10/16/2020   Procedure: LEFT HEART CATH AND CORS/GRAFTS ANGIOGRAPHY;  Surgeon: Derrick Lapping, Stokes;  Location[facility] INVASIVE CV LAB;  Service: Cardiovascular;  Laterality: N/A;   LEFT HEART CATHETERIZATION WITH CORONARY/GRAFT  ANGIOGRAM N/A 09/18/2014   Procedure: LEFT HEART CATHETERIZATION WITH Derrick Stokes;  Surgeon: Derrick Rusk, Stokes;  Location: [facility] CATH LAB;  Service: Cardiovascular;  Laterality: N/A;   PERCUTANEOUS CORONARY ROTOBLATOR INTERVENTION (PCI-R) N/A 09/20/2014   Procedure: PERCUTANEOUS CORONARY ROTOBLATOR INTERVENTION (PCI-R);  Surgeon: Derrick Rusk, Stokes;  Location: [facility] CATH LAB;  Service: Cardiovascular;  Laterality: N/A;   POLYPECTOMY  02/28/2020   6 polyps/tics/hems   POLYPECTOMY  05/08/2020   Procedure: POLYPECTOMY;   Surgeon: Mansouraty, Albino Alu., Stokes;  Location: [facility] ENDOSCOPY;  Service: Gastroenterology;;   Derrick Stokes  05/08/2020   Procedure: Derrick Stokes;  Surgeon: Mansouraty, Albino Alu., Stokes;  Location: [facility] ENDOSCOPY;  Service: Gastroenterology;;   SUBMUCOSAL LIFTING INJECTION  05/08/2020   Procedure: SUBMUCOSAL LIFTING INJECTION;  Surgeon: Derrick Stokes., Stokes;  Location: [facility] ENDOSCOPY;  Service: Gastroenterology;;    Family History  Problem Relation Age of Onset   Diabetes Father    Heart attack Father 52   Heart disease Father        CAD   Cancer Mother    Diabetes Brother    Diabetes Brother    Colon cancer Neg Hx    Esophageal cancer Neg Hx    Stomach cancer Neg Hx    Pancreatic cancer Neg Hx    Inflammatory bowel disease Neg Hx    Liver disease Neg Hx    Rectal cancer Neg Hx    Colon polyps Neg Hx     Social History:  reports that he has been smoking cigarettes. He started smoking about 55 years ago. He has a 55.4 pack-year smoking history. He has been exposed to tobacco smoke. He has never used smokeless tobacco. He reports current alcohol use of about 3.0 standard drinks of alcohol per week. He reports that he does not use drugs.  Allergies:  Allergies  Allergen Reactions   Iodine Nausea And Vomiting and Other (See Comments)    Diaphoresis  Pallid complexion   Iohexol  Nausea And Vomiting   Shellfish Allergy Nausea And Vomiting and Other (See Comments)    Shrimp, mainly- Diaphoresis and Pallid complexion too   Isordil  Titradose [Isosorbide  Dinitrate] Other (See Comments)    Headache     Medications: I have reviewed the patient's current medications. Prior to Admission:  Medications Prior to Admission  Medication Sig Dispense Refill Last Dose/Taking   albuterol  (VENTOLIN  HFA) 108 (90 Base) MCG/ACT inhaler INHALE 1-2 PUFFS BY MOUTH INTO THE LUNGS EVERY 6 HOURS AS NEEDED FOR WHEEZING AND/OR FOR SHORTNESS OF BREATH 8.5 g 10 12/01/2023 Morning    Aspirin -Acetaminophen -Caffeine (GOODY HEADACHE PO) Take 1-2 packets by mouth daily as needed (pain, headache).   Unknown   atorvastatin  (LIPITOR ) 80 MG tablet TAKE 1 TABLET (80 MG TOTAL) BY MOUTH EVERY MORNING. 90 tablet 3 11/29/2023   clopidogrel  (PLAVIX ) 75 MG tablet TAKE 1 TABLET BY MOUTH EVERY MORNING 90 tablet 3 11/29/2023   DULoxetine  (CYMBALTA ) 60 MG capsule TAKE 1 CAPSULE BY MOUTH ONCE DAILY 90 capsule 1 11/29/2023   ezetimibe  (ZETIA ) 10 MG tablet TAKE 1 TABLET BY MOUTH DAILY 90 tablet 3 11/29/2023   gabapentin  (NEURONTIN ) 300 MG capsule TAKE 1 CAPSULE BY MOUTH AT BEDTIME 30 capsule 5 11/29/2023   metoprolol  succinate (TOPROL -XL) 50 MG 24 hr tablet TAKE 1 TABLET BY MOUTH ONCE DAILY WITH A MEAL 90 tablet 3 11/29/2023   nitroGLYCERIN  (NITROSTAT ) 0.4 MG SL tablet Place 1 tablet (0.4 mg total) under the tongue every 5 (five) minutes as needed for chest pain.  PLACE 1 TABLET UNDER THE TONGUE EVERY 5 MINUTES X 3 DOSES AS NEEDED FOR CHEST PAIN. (Patient taking differently: Place 0.4 mg under the tongue every 5 (five) minutes x 3 doses as needed for chest pain.) 25 tablet 3 Unknown   oxyCODONE  (OXY IR/ROXICODONE ) 5 MG immediate release tablet Take 5-10 mg by mouth every 6 (six) hours as needed for moderate pain (pain score 4-6) or severe pain (pain score 7-10).   12/01/2023 Morning   pantoprazole  (PROTONIX ) 40 MG tablet TAKE 1 TABLET BY MOUTH DAILY (Patient taking differently: Take 40 mg by mouth at bedtime.) 90 tablet 3 11/29/2023   Scheduled:  acetaminophen   1,000 mg Oral Q6H   atorvastatin   80 mg Oral q morning   docusate sodium  100 mg Oral BID   DULoxetine   60 mg Oral Daily   ezetimibe   10 mg Oral Daily   gabapentin   300 mg Oral QHS   levETIRAcetam  500 mg Intravenous Q12H   methocarbamol   500 mg Oral Q8H   Or   methocarbamol  (ROBAXIN ) injection  500 mg Intravenous Q8H   metoprolol  succinate  50 mg Oral Daily   pantoprazole   40 mg Oral Daily   Continuous:  lactated ringers  50 mL/hr at 12/01/23  2337   PRN:albuterol , hydrALAZINE , hydrALAZINE , HYDROmorphone  (DILAUDID ) injection, metoprolol  tartrate, ondansetron  **OR** ondansetron  (ZOFRAN ) IV, oxyCODONE , polyethylene glycol Anti-infectives (From admission, onward)    None        Results for orders placed or performed during the hospital encounter of 12/01/23 (from the past 48 hours)  Resp panel by RT-PCR (RSV, Flu A&B, Covid) Anterior Nasal Swab     Status: None   Collection Time: 12/01/23  3:59 PM   Specimen: Anterior Nasal Swab  Result Value Ref Range   SARS Coronavirus 2 by RT PCR NEGATIVE NEGATIVE    Comment: (NOTE) SARS-CoV-2 target nucleic acids are NOT DETECTED.  The SARS-CoV-2 RNA is generally detectable in upper respiratory specimens during the acute phase of infection. The lowest concentration of SARS-CoV-2 viral copies this assay can detect is 138 copies/mL. A negative result does not preclude SARS-Cov-2 infection and should not be used as the sole basis for treatment or other patient management decisions. A negative result may occur with  improper specimen collection/handling, submission of specimen other than nasopharyngeal swab, presence of viral mutation(s) within the areas targeted by this assay, and inadequate number of viral copies(<138 copies/mL). A negative result must be combined with clinical observations, patient history, and epidemiological information. The expected result is Negative.  Fact Sheet for Patients:  BloggerCourse.com  Fact Sheet for Healthcare Providers:  SeriousBroker.it  This test is no t yet approved or cleared by the [place]  FDA and  has been authorized for detection and/or diagnosis of SARS-CoV-2 by FDA under an Emergency Use Authorization (EUA). This EUA will remain  in effect (meaning this test can be used) for the duration of the COVID-19 declaration under Section 564(b)(1) of the Act, 21 U.S.C.section 360bbb-3(b)(1),  unless the authorization is terminated  or revoked sooner.       Influenza A by PCR NEGATIVE NEGATIVE   Influenza B by PCR NEGATIVE NEGATIVE    Comment: (NOTE) The Xpert Xpress SARS-CoV-2/FLU/RSV plus assay is intended as an aid in the diagnosis of influenza from Nasopharyngeal swab specimens and should not be used as a sole basis for treatment. Nasal washings and aspirates are unacceptable for Xpert Xpress SARS-CoV-2/FLU/RSV testing.  Fact Sheet for Patients: BloggerCourse.com  Fact Sheet for Healthcare  Providers: SeriousBroker.it  This test is not yet approved or cleared by the [place]  FDA and has been authorized for detection and/or diagnosis of SARS-CoV-2 by FDA under an Emergency Use Authorization (EUA). This EUA will remain in effect (meaning this test can be used) for the duration of the COVID-19 declaration under Section 564(b)(1) of the Act, 21 U.S.C. section 360bbb-3(b)(1), unless the authorization is terminated or revoked.     Resp Syncytial Virus by PCR NEGATIVE NEGATIVE    Comment: (NOTE) Fact Sheet for Patients: BloggerCourse.com  Fact Sheet for Healthcare Providers: SeriousBroker.it  This test is not yet approved or cleared by the [place]  FDA and has been authorized for detection and/or diagnosis of SARS-CoV-2 by FDA under an Emergency Use Authorization (EUA). This EUA will remain in effect (meaning this test can be used) for the duration of the COVID-19 declaration under Section 564(b)(1) of the Act, 21 U.S.C. section 360bbb-3(b)(1), unless the authorization is terminated or revoked.  Performed at [facility], [address], [place], [place] [address]   CBC with Differential     Status: Abnormal   Collection Time: 12/01/23  4:16 PM  Result Value Ref Range   WBC 10.8 (H) 4.0 - 10.5 K/uL   RBC 4.49 4.22 - 5.81 MIL/uL    Hemoglobin 14.5 13.0 - 17.0 g/dL   HCT 19.1 47.8 - 29.5 %   MCV 93.3 80.0 - 100.0 fL   MCH 32.3 26.0 - 34.0 pg   MCHC 34.6 30.0 - 36.0 g/dL   RDW 62.1 30.8 - 65.7 %   Platelets 207 150 - 400 K/uL   nRBC 0.0 0.0 - 0.2 %   Neutrophils Relative % 77 %   Neutro Abs 8.3 (H) 1.7 - 7.7 K/uL   Lymphocytes Relative 13 %   Lymphs Abs 1.4 0.7 - 4.0 K/uL   Monocytes Relative 9 %   Monocytes Absolute 0.9 0.1 - 1.0 K/uL   Eosinophils Relative 0 %   Eosinophils Absolute 0.0 0.0 - 0.5 K/uL   Basophils Relative 0 %   Basophils Absolute 0.0 0.0 - 0.1 K/uL   Immature Granulocytes 1 %   Abs Immature Granulocytes 0.05 0.00 - 0.07 K/uL    Comment: Performed at [facility], [address] [place], [place] [address]  Comprehensive metabolic panel     Status: Abnormal   Collection Time: 12/01/23  4:16 PM  Result Value Ref Range   Sodium 135 135 - 145 mmol/L   Potassium 4.0 3.5 - 5.1 mmol/L   Chloride 100 98 - 111 mmol/L   CO2 24 22 - 32 mmol/L   Glucose, Bld 104 (H) 70 - 99 mg/dL    Comment: Glucose reference range applies only to samples taken after fasting for at least 8 hours.   BUN 10 8 - 23 mg/dL   Creatinine, Ser 2.95 (L) 0.61 - 1.24 mg/dL   Calcium  9.5 8.9 - 10.3 mg/dL   Total Protein 8.2 (H) 6.5 - 8.1 g/dL   Albumin 4.3 3.5 - 5.0 g/dL   AST 33 15 - 41 U/L   ALT 30 0 - 44 U/L   Alkaline Phosphatase 113 38 - 126 U/L   Total Bilirubin 1.8 (H) 0.0 - 1.2 mg/dL   GFR, Estimated >28 >41 mL/min    Comment: (NOTE) Calculated using the CKD-EPI Creatinine Equation (2021)    Anion gap 11 5 - 15    Comment: Performed at [facility], [address] [place], [place] [address]  Troponin I (High Sensitivity)     Status: None   Collection Time: 12/01/23  4:16 PM  Result Value Ref Range   Troponin I (High Sensitivity) 3 <18 ng/L    Comment: (NOTE) Elevated high sensitivity troponin I (hsTnI) values and significant  changes across serial measurements may  suggest ACS but many other  chronic and acute conditions are known to elevate hsTnI results.  Refer to the "Links" section for chest pain algorithms and additional  guidance. Performed at [facility], [address] [place], [place] [address]   Troponin I (High Sensitivity)     Status: None   Collection Time: 12/01/23  6:16 PM  Result Value Ref Range   Troponin I (High Sensitivity) 3 <18 ng/L    Comment: (NOTE) Elevated high sensitivity troponin I (hsTnI) values and significant  changes across serial measurements may suggest ACS but many other  chronic and acute conditions are known to elevate hsTnI results.  Refer to the "Links" section for chest pain algorithms and additional  guidance. Performed at [facility], [address], [place], [place] [address]   CBC     Status: Abnormal   Collection Time: 12/02/23  6:30 AM  Result Value Ref Range   WBC 9.3 4.0 - 10.5 K/uL   RBC 4.16 (L) 4.22 - 5.81 MIL/uL   Hemoglobin 13.6 13.0 - 17.0 g/dL   HCT 29.5 (L) 62.1 - 30.8 %   MCV 91.8 80.0 - 100.0 fL   MCH 32.7 26.0 - 34.0 pg   MCHC 35.6 30.0 - 36.0 g/dL   RDW 65.7 84.6 - 96.2 %   Platelets 183 150 - 400 K/uL   nRBC 0.0 0.0 - 0.2 %    Comment: Performed at [facility], [address] [place], [place] [address]  Basic metabolic panel with GFR     Status: Abnormal   Collection Time: 12/02/23  6:30 AM  Result Value Ref Range   Sodium 135 135 - 145 mmol/L   Potassium 3.6 3.5 - 5.1 mmol/L   Chloride 103 98 - 111 mmol/L   CO2 24 22 - 32 mmol/L   Glucose, Bld 108 (H) 70 - 99 mg/dL    Comment: Glucose reference range applies only to samples taken after fasting for at least 8 hours.   BUN 11 8 - 23 mg/dL   Creatinine, Ser 1.32 (L) 0.61 - 1.24 mg/dL   Calcium  9.0 8.9 - 10.3 mg/dL   GFR, Estimated >44 >01 mL/min    Comment: (NOTE) Calculated using the CKD-EPI Creatinine Equation (2021)    Anion gap 8 5 - 15    Comment: Performed at [facility], [address] [place], [place] [address]    DG CHEST PORT 1 VIEW Result Date: 12/02/2023 CLINICAL DATA:  Rib fractures. EXAM: PORTABLE CHEST 1 VIEW COMPARISON:  03/18/2018 FINDINGS: Low volume film. The cardio pericardial silhouette is enlarged. There is pulmonary vascular congestion without overt pulmonary edema. Bibasilar atelectasis with small bilateral pleural effusions. Bones are diffusely demineralized. Known left-sided anterior rib fractures not well demonstrated. Telemetry leads overlie the chest. IMPRESSION: 1. Low volume film with bibasilar atelectasis and small bilateral pleural effusions. No evidence for pneumothorax. 2. Enlargement of the cardiopericardial silhouette with pulmonary vascular congestion. 3. Known anterior left rib fractures not well demonstrated on this study. Electronically Signed   By: Donnal Fusi M.D.   On: 12/02/2023 05:44   CT  HEAD WO CONTRAST ( ) Result Date: 12/02/2023 CLINICAL DATA:  Fall EXAM: CT HEAD WITHOUT CONTRAST TECHNIQUE: Contiguous axial images were obtained from the base of the skull through the vertex without intravenous contrast. RADIATION DOSE REDUCTION: This exam was performed according to the departmental dose-optimization program which includes automated exposure control, adjustment of the mA and/or kV according to patient size and/or use of iterative reconstruction technique. COMPARISON:  12/01/2023 FINDINGS: Brain: Unchanged appearance of hemorrhagic focus within the superior left frontal lobe. There is generalized volume loss. There is hypoattenuation of the bilateral supratentorial white matter. Vascular: There is atherosclerotic calcification of both internal carotid arteries at the skull base. Skull: No skull fracture Sinuses/Orbits: Mild mucosal thickening of sphenoid sinuses. Normal orbits. Other: None IMPRESSION: 1. Unchanged appearance of hemorrhagic focus within the superior left frontal lobe. 2. No new acute intracranial  abnormality. Electronically Signed   By: Juanetta Nordmann M.D.   On: 12/02/2023 03:48   CT Head Wo Contrast Result Date: 12/01/2023 CLINICAL DATA:  Fall with head trauma EXAM: CT HEAD WITHOUT CONTRAST CT CERVICAL SPINE WITHOUT CONTRAST TECHNIQUE: Multidetector CT imaging of the head and cervical spine was performed following the standard protocol without intravenous contrast. Multiplanar CT image reconstructions of the cervical spine were also generated. RADIATION DOSE REDUCTION: This exam was performed according to the departmental dose-optimization program which includes automated exposure control, adjustment of the mA and/or kV according to patient size and/or use of iterative reconstruction technique. COMPARISON:  None Available. FINDINGS: CT HEAD FINDINGS Brain: Negative for intracranial mass. Atrophy and moderate severe chronic small vessel ischemic changes of the white matter. Prominent ventricles felt secondary to atrophy. Focal intraparenchymal hemorrhage at the left frontal vertex, this measures 1.1 x 0.8 x 0.8 cm, estimated volume of less than 1 mL. Mild surrounding hypodense edema. No significant mass effect or midline shift. Vascular: No hyperdense vessels.  Carotid vascular calcifications. Skull: No definitive fracture Sinuses/Orbits: Moderate mucosal thickening in the sinuses Other: Traumatic Brain Injury Risk Stratification Skull Fracture: No - Low/mBIG 1 Subdural Hematoma (SDH): No - Low Subarachnoid Hemorrhage [facility]): No Epidural Hematoma (EDH): No - Low/mBIG 1 Cerebral contusion, intra-axial, intraparenchymal Hemorrhage (IPH): No Intraventricular Hemorrhage (IVH): No - Low/mBIG 1 Midline Shift > 1mm or Edema/effacement of sulci/vents: No - Low/mBIG 1 ---------------------------------------------------- CT CERVICAL SPINE FINDINGS Alignment: Trace anterolisthesis C2 on C3. Facet alignment is within normal limits. Skull base and vertebrae: No acute fracture. No primary bone lesion or focal pathologic  process. Soft tissues and spinal canal: No prevertebral fluid or swelling. No visible canal hematoma. Disc levels: Mild disc space narrowing at C3-C4. Multilevel mild facet degenerative changes. Upper chest: See separately dictated chest CT Other: None IMPRESSION: 1. 1.1 cm intraparenchymal hemorrhage at the left frontal vertex with mild surrounding edema. No significant mass effect or midline shift. 2. Atrophy and chronic small vessel ischemic changes of the white matter. 3. No CT evidence for acute osseous abnormality of the cervical spine. Critical Value/emergent results were called by telephone at the time of interpretation on 12/01/2023 at 5:10 pm to provider HAYLEY NAASZ , who verbally acknowledged these results. Electronically Signed   By: Esmeralda Hedge M.D.   On: 12/01/2023 17:34   CT Cervical Spine Wo Contrast Result Date: 12/01/2023 CLINICAL DATA:  Fall with head trauma EXAM: CT HEAD WITHOUT CONTRAST CT CERVICAL SPINE WITHOUT CONTRAST TECHNIQUE: Multidetector CT imaging of the head and cervical spine was performed following the standard protocol without intravenous contrast. Multiplanar CT image reconstructions of the cervical  spine were also generated. RADIATION DOSE REDUCTION: This exam was performed according to the departmental dose-optimization program which includes automated exposure control, adjustment of the mA and/or kV according to patient size and/or use of iterative reconstruction technique. COMPARISON:  None Available. FINDINGS: CT HEAD FINDINGS Brain: Negative for intracranial mass. Atrophy and moderate severe chronic small vessel ischemic changes of the white matter. Prominent ventricles felt secondary to atrophy. Focal intraparenchymal hemorrhage at the left frontal vertex, this measures 1.1 x 0.8 x 0.8 cm, estimated volume of less than 1 mL. Mild surrounding hypodense edema. No significant mass effect or midline shift. Vascular: No hyperdense vessels.  Carotid vascular calcifications.  Skull: No definitive fracture Sinuses/Orbits: Moderate mucosal thickening in the sinuses Other: Traumatic Brain Injury Risk Stratification Skull Fracture: No - Low/mBIG 1 Subdural Hematoma (SDH): No - Low Subarachnoid Hemorrhage [facility]): No Epidural Hematoma (EDH): No - Low/mBIG 1 Cerebral contusion, intra-axial, intraparenchymal Hemorrhage (IPH): No Intraventricular Hemorrhage (IVH): No - Low/mBIG 1 Midline Shift > 1mm or Edema/effacement of sulci/vents: No - Low/mBIG 1 ---------------------------------------------------- CT CERVICAL SPINE FINDINGS Alignment: Trace anterolisthesis C2 on C3. Facet alignment is within normal limits. Skull base and vertebrae: No acute fracture. No primary bone lesion or focal pathologic process. Soft tissues and spinal canal: No prevertebral fluid or swelling. No visible canal hematoma. Disc levels: Mild disc space narrowing at C3-C4. Multilevel mild facet degenerative changes. Upper chest: See separately dictated chest CT Other: None IMPRESSION: 1. 1.1 cm intraparenchymal hemorrhage at the left frontal vertex with mild surrounding edema. No significant mass effect or midline shift. 2. Atrophy and chronic small vessel ischemic changes of the white matter. 3. No CT evidence for acute osseous abnormality of the cervical spine. Critical Value/emergent results were called by telephone at the time of interpretation on 12/01/2023 at 5:10 pm to provider HAYLEY NAASZ , who verbally acknowledged these results. Electronically Signed   By: Esmeralda Hedge M.D.   On: 12/01/2023 17:34   CT Thoracic Spine Wo Contrast Result Date: 12/01/2023 CLINICAL DATA:  Back trauma. EXAM: CT THORACIC SPINE WITHOUT CONTRAST TECHNIQUE: Multidetector CT images of the thoracic were obtained using the standard protocol without intravenous contrast. RADIATION DOSE REDUCTION: This exam was performed according to the departmental dose-optimization program which includes automated exposure control, adjustment of the mA  and/or kV according to patient size and/or use of iterative reconstruction technique. COMPARISON:  Chest CT dated 08/08/2021. FINDINGS: Alignment: No acute subluxation. Vertebrae: No acute fracture. The bones are osteopenic. Multilevel old compression fractures most prominent at T6 and T8. Paraspinal and other soft tissues: Negative. Disc levels: Multilevel degenerative changes. IMPRESSION: 1. No acute/traumatic thoracic spine pathology. 2. Multilevel old compression fractures. Electronically Signed   By: Angus Bark M.D.   On: 12/01/2023 16:58   CT Chest Wo Contrast Result Date: 12/01/2023 CLINICAL DATA:  Chest trauma.  Fall. EXAM: CT CHEST WITHOUT CONTRAST TECHNIQUE: Multidetector CT imaging of the chest was performed following the standard protocol without IV contrast. RADIATION DOSE REDUCTION: This exam was performed according to the departmental dose-optimization program which includes automated exposure control, adjustment of the mA and/or kV according to patient size and/or use of iterative reconstruction technique. COMPARISON:  Chest CT dated 08/08/2021. FINDINGS: Evaluation of this exam is limited in the absence of intravenous contrast. Cardiovascular: There is no cardiomegaly or pericardial effusion. There is coronary vascular calcification and postsurgical changes of CABG. Mild atherosclerotic calcification of the thoracic aorta. No aneurysmal dilatation. The central pulmonary arteries are grossly unremarkable. Mediastinum/Nodes: No hilar  or mediastinal adenopathy. Small hiatal hernia. The esophagus and the thyroid  gland are grossly unremarkable. No mediastinal fluid collection. Lungs/Pleura: Left lung base subpleural atelectasis. No focal consolidation, pleural effusion, or pneumothorax. The central airways are patent. Upper Abdomen: No acute abnormality. Musculoskeletal: Median sternotomy wires. Nondisplaced fractures of the anterior left 2nd-8th ribs. There is osteopenia with degenerative  changes of the spine. Multilevel old appearing compression fractures of the thoracic spine. IMPRESSION: 1. Nondisplaced fractures of the anterior left 2nd-8th ribs. No pneumothorax. 2.  Aortic Atherosclerosis (ICD10-I70.0). Electronically Signed   By: Angus Bark M.D.   On: 12/01/2023 16:55    ROS: As above Blood pressure 117/74, pulse 75, temperature 97.6 F (36.4 C), temperature source Oral, resp. rate (!) 22, height 5\' 7"  (1.702 m), weight 78 kg, SpO2 94%. Estimated body mass index is 26.94 kg/m as calculated from the following:   Height as of this encounter: 5\' 7"  (1.702 m).   Weight as of this encounter: 78 kg.  Physical Exam  General: An alert and pleasant hard of hearing 73 year old white male who complains of soreness.  HEENT: Normocephalic, extraocular muscles are intact.  Neck: Unremarkable, age-appropriate decreased range of motion.  Spurling's testing is negative.  Thorax: Symmetric, sternotomy is well-healed  Abdomen: Soft  Extremities: Unremarkable except for a remote amputation of his left fourth digit. Neurologic exam: The patient is alert and oriented x 3.  Cranial nerves II through XII are examined bilaterally grossly normal except he has decreased hearing bilaterally.  His motor strength is normal in his vital bicep, tricep, handgrip, gastrocnemius and dorsiflexors.  Cerebellar function is intact to rapid alternating movements of the upper extremities bilaterally.  Sensory function is intact to light touch sensation all tested dermatomes bilaterally.  I have reviewed the patient's head CT performed yesterday and today.  It demonstrates a small left frontal contusion without mass effect, brain atrophy, etc.  I reviewed his cervical CT performed yesterday.  Demonstrates degenerative changes.    Assessment/Plan: Cerebral contusion: The patient is doing well neurologically.  It has been stable on serial scans.  No further workup or intervention is needed.  He can  resume his aspirin  and Plavix  from my point of view.  I will sign off.  Please call if I can be of further assistance.  Elder Greening 12/02/2023, 7:48 AM

## 2023-12-02 NOTE — Plan of Care (Signed)
 ?  Problem: Clinical Measurements: ?Goal: Will remain free from infection ?Outcome: Progressing ?  ?

## 2023-12-02 NOTE — Progress Notes (Signed)
 Patient ID: Derrick Stokes, male   DOB: 1950/12/07, 73 y.o.   MRN: [id number]      Subjective: Getting echo ROS negative except as listed above. Objective: Vital signs in last 24 hours: Temp:  [97.5 F (36.4 C)-98.1 F (36.7 C)] 97.6 F (36.4 C) (05/15 0753) Pulse Rate:  [64-94] 64 (05/15 0753) Resp:  [18-27] 21 (05/15 0753) BP: (117-132)/(74-90) 128/76 (05/15 0753) SpO2:  [94 %-97 %] 95 % (05/15 0753) Weight:  [78 kg] 78 kg (05/14 1443)    Intake/Output from previous day: 05/14 0701 - 05/15 0700 In: 143.1 [I.V.:143.1] Out: -  Intake/Output this shift: Total I/O In: 240 [P.O.:240] Out: -   General appearance: alert and cooperative Resp: clear to auscultation bilaterally Neuro: alert, F/CHaving echo so defer further exam  Lab Results: CBC  Recent Labs    12/01/23 1616 12/02/23 0630  WBC 10.8* 9.3  HGB 14.5 13.6  HCT 41.9 38.2*  PLT 207 183   BMET Recent Labs    12/01/23 1616 12/02/23 0630  NA 135 135  K 4.0 3.6  CL 100 103  CO2 24 24  GLUCOSE 104* 108*  BUN 10 11  CREATININE 0.48* 0.60*  CALCIUM  9.5 9.0   PT/INR No results for input(s): "LABPROT", "INR" in the last 72 hours. ABG No results for input(s): "PHART", "HCO3" in the last 72 hours.  Invalid input(s): "PCO2", "PO2"  Studies/Results: DG CHEST PORT 1 VIEW Result Date: 12/02/2023 CLINICAL DATA:  Rib fractures. EXAM: PORTABLE CHEST 1 VIEW COMPARISON:  03/18/2018 FINDINGS: Low volume film. The cardio pericardial silhouette is enlarged. There is pulmonary vascular congestion without overt pulmonary edema. Bibasilar atelectasis with small bilateral pleural effusions. Bones are diffusely demineralized. Known left-sided anterior rib fractures not well demonstrated. Telemetry leads overlie the chest. IMPRESSION: 1. Low volume film with bibasilar atelectasis and small bilateral pleural effusions. No evidence for pneumothorax. 2. Enlargement of the cardiopericardial silhouette with pulmonary vascular  congestion. 3. Known anterior left rib fractures not well demonstrated on this study. Electronically Signed   By: Donnal Fusi M.D.   On: 12/02/2023 05:44   CT HEAD WO CONTRAST ( ) Result Date: 12/02/2023 CLINICAL DATA:  Fall EXAM: CT HEAD WITHOUT CONTRAST TECHNIQUE: Contiguous axial images were obtained from the base of the skull through the vertex without intravenous contrast. RADIATION DOSE REDUCTION: This exam was performed according to the departmental dose-optimization program which includes automated exposure control, adjustment of the mA and/or kV according to patient size and/or use of iterative reconstruction technique. COMPARISON:  12/01/2023 FINDINGS: Brain: Unchanged appearance of hemorrhagic focus within the superior left frontal lobe. There is generalized volume loss. There is hypoattenuation of the bilateral supratentorial white matter. Vascular: There is atherosclerotic calcification of both internal carotid arteries at the skull base. Skull: No skull fracture Sinuses/Orbits: Mild mucosal thickening of sphenoid sinuses. Normal orbits. Other: None IMPRESSION: 1. Unchanged appearance of hemorrhagic focus within the superior left frontal lobe. 2. No new acute intracranial abnormality. Electronically Signed   By: Juanetta Nordmann M.D.   On: 12/02/2023 03:48   CT Head Wo Contrast Result Date: 12/01/2023 CLINICAL DATA:  Fall with head trauma EXAM: CT HEAD WITHOUT CONTRAST CT CERVICAL SPINE WITHOUT CONTRAST TECHNIQUE: Multidetector CT imaging of the head and cervical spine was performed following the standard protocol without intravenous contrast. Multiplanar CT image reconstructions of the cervical spine were also generated. RADIATION DOSE REDUCTION: This exam was performed according to the departmental dose-optimization program which includes automated exposure control, adjustment of the  mA and/or kV according to patient size and/or use of iterative reconstruction technique. COMPARISON:  None  Available. FINDINGS: CT HEAD FINDINGS Brain: Negative for intracranial mass. Atrophy and moderate severe chronic small vessel ischemic changes of the white matter. Prominent ventricles felt secondary to atrophy. Focal intraparenchymal hemorrhage at the left frontal vertex, this measures 1.1 x 0.8 x 0.8 cm, estimated volume of less than 1 mL. Mild surrounding hypodense edema. No significant mass effect or midline shift. Vascular: No hyperdense vessels.  Carotid vascular calcifications. Skull: No definitive fracture Sinuses/Orbits: Moderate mucosal thickening in the sinuses Other: Traumatic Brain Injury Risk Stratification Skull Fracture: No - Low/mBIG 1 Subdural Hematoma (SDH): No - Low Subarachnoid Hemorrhage [facility]): No Epidural Hematoma (EDH): No - Low/mBIG 1 Cerebral contusion, intra-axial, intraparenchymal Hemorrhage (IPH): No Intraventricular Hemorrhage (IVH): No - Low/mBIG 1 Midline Shift > 1mm or Edema/effacement of sulci/vents: No - Low/mBIG 1 ---------------------------------------------------- CT CERVICAL SPINE FINDINGS Alignment: Trace anterolisthesis C2 on C3. Facet alignment is within normal limits. Skull base and vertebrae: No acute fracture. No primary bone lesion or focal pathologic process. Soft tissues and spinal canal: No prevertebral fluid or swelling. No visible canal hematoma. Disc levels: Mild disc space narrowing at C3-C4. Multilevel mild facet degenerative changes. Upper chest: See separately dictated chest CT Other: None IMPRESSION: 1. 1.1 cm intraparenchymal hemorrhage at the left frontal vertex with mild surrounding edema. No significant mass effect or midline shift. 2. Atrophy and chronic small vessel ischemic changes of the white matter. 3. No CT evidence for acute osseous abnormality of the cervical spine. Critical Value/emergent results were called by telephone at the time of interpretation on 12/01/2023 at 5:10 pm to provider HAYLEY NAASZ , who verbally acknowledged these results.  Electronically Signed   By: Esmeralda Hedge M.D.   On: 12/01/2023 17:34   CT Cervical Spine Wo Contrast Result Date: 12/01/2023 CLINICAL DATA:  Fall with head trauma EXAM: CT HEAD WITHOUT CONTRAST CT CERVICAL SPINE WITHOUT CONTRAST TECHNIQUE: Multidetector CT imaging of the head and cervical spine was performed following the standard protocol without intravenous contrast. Multiplanar CT image reconstructions of the cervical spine were also generated. RADIATION DOSE REDUCTION: This exam was performed according to the departmental dose-optimization program which includes automated exposure control, adjustment of the mA and/or kV according to patient size and/or use of iterative reconstruction technique. COMPARISON:  None Available. FINDINGS: CT HEAD FINDINGS Brain: Negative for intracranial mass. Atrophy and moderate severe chronic small vessel ischemic changes of the white matter. Prominent ventricles felt secondary to atrophy. Focal intraparenchymal hemorrhage at the left frontal vertex, this measures 1.1 x 0.8 x 0.8 cm, estimated volume of less than 1 mL. Mild surrounding hypodense edema. No significant mass effect or midline shift. Vascular: No hyperdense vessels.  Carotid vascular calcifications. Skull: No definitive fracture Sinuses/Orbits: Moderate mucosal thickening in the sinuses Other: Traumatic Brain Injury Risk Stratification Skull Fracture: No - Low/mBIG 1 Subdural Hematoma (SDH): No - Low Subarachnoid Hemorrhage [facility]): No Epidural Hematoma (EDH): No - Low/mBIG 1 Cerebral contusion, intra-axial, intraparenchymal Hemorrhage (IPH): No Intraventricular Hemorrhage (IVH): No - Low/mBIG 1 Midline Shift > 1mm or Edema/effacement of sulci/vents: No - Low/mBIG 1 ---------------------------------------------------- CT CERVICAL SPINE FINDINGS Alignment: Trace anterolisthesis C2 on C3. Facet alignment is within normal limits. Skull base and vertebrae: No acute fracture. No primary bone lesion or focal pathologic  process. Soft tissues and spinal canal: No prevertebral fluid or swelling. No visible canal hematoma. Disc levels: Mild disc space narrowing at C3-C4. Multilevel mild facet degenerative  changes. Upper chest: See separately dictated chest CT Other: None IMPRESSION: 1. 1.1 cm intraparenchymal hemorrhage at the left frontal vertex with mild surrounding edema. No significant mass effect or midline shift. 2. Atrophy and chronic small vessel ischemic changes of the white matter. 3. No CT evidence for acute osseous abnormality of the cervical spine. Critical Value/emergent results were called by telephone at the time of interpretation on 12/01/2023 at 5:10 pm to provider HAYLEY NAASZ , who verbally acknowledged these results. Electronically Signed   By: Esmeralda Hedge M.D.   On: 12/01/2023 17:34   CT Thoracic Spine Wo Contrast Result Date: 12/01/2023 CLINICAL DATA:  Back trauma. EXAM: CT THORACIC SPINE WITHOUT CONTRAST TECHNIQUE: Multidetector CT images of the thoracic were obtained using the standard protocol without intravenous contrast. RADIATION DOSE REDUCTION: This exam was performed according to the departmental dose-optimization program which includes automated exposure control, adjustment of the mA and/or kV according to patient size and/or use of iterative reconstruction technique. COMPARISON:  Chest CT dated 08/08/2021. FINDINGS: Alignment: No acute subluxation. Vertebrae: No acute fracture. The bones are osteopenic. Multilevel old compression fractures most prominent at T6 and T8. Paraspinal and other soft tissues: Negative. Disc levels: Multilevel degenerative changes. IMPRESSION: 1. No acute/traumatic thoracic spine pathology. 2. Multilevel old compression fractures. Electronically Signed   By: Angus Bark M.D.   On: 12/01/2023 16:58   CT Chest Wo Contrast Result Date: 12/01/2023 CLINICAL DATA:  Chest trauma.  Fall. EXAM: CT CHEST WITHOUT CONTRAST TECHNIQUE: Multidetector CT imaging of the chest was  performed following the standard protocol without IV contrast. RADIATION DOSE REDUCTION: This exam was performed according to the departmental dose-optimization program which includes automated exposure control, adjustment of the mA and/or kV according to patient size and/or use of iterative reconstruction technique. COMPARISON:  Chest CT dated 08/08/2021. FINDINGS: Evaluation of this exam is limited in the absence of intravenous contrast. Cardiovascular: There is no cardiomegaly or pericardial effusion. There is coronary vascular calcification and postsurgical changes of CABG. Mild atherosclerotic calcification of the thoracic aorta. No aneurysmal dilatation. The central pulmonary arteries are grossly unremarkable. Mediastinum/Nodes: No hilar or mediastinal adenopathy. Small hiatal hernia. The esophagus and the thyroid  gland are grossly unremarkable. No mediastinal fluid collection. Lungs/Pleura: Left lung base subpleural atelectasis. No focal consolidation, pleural effusion, or pneumothorax. The central airways are patent. Upper Abdomen: No acute abnormality. Musculoskeletal: Median sternotomy wires. Nondisplaced fractures of the anterior left 2nd-8th ribs. There is osteopenia with degenerative changes of the spine. Multilevel old appearing compression fractures of the thoracic spine. IMPRESSION: 1. Nondisplaced fractures of the anterior left 2nd-8th ribs. No pneumothorax. 2.  Aortic Atherosclerosis (ICD10-I70.0). Electronically Signed   By: Angus Bark M.D.   On: 12/01/2023 16:55    Anti-infectives: Anti-infectives (From admission, onward)    None       Assessment/Plan: Syncope with GLF  TBI/27mm intraparenchymal hemorrhage - per Dr. Larrie Po, seriel CT H stable, OK to resume ASA and Plavix . Plan TBI team therapies L rib fxs 2-8 - multimodal pain control, IS 10x/hr while awake, PRN O2, F/U CXR no PTX EtOH use - CIWA, CAGE-AID Syncope -appreciate TRH evaluation; echo in process, carotid  ultrasound pending Hypertension History of CAD status post CABG, DES Aortic atherosclerosis Tobacco abuse GERD Multilevel remote thoracic compression fractures Dispo - 4NP, syncope W/U, TBI team therapies I spoke with his son at the bedside    LOS: 1 day    Dorena Gander, MD, MPH, FACS Trauma & General Surgery Use [email or web address] to contact  on call provider  12/02/2023

## 2023-12-02 NOTE — Progress Notes (Signed)
*  PRELIMINARY RESULTS* Echocardiogram 2D Echocardiogram has been performed.  Derrick Stokes 12/02/2023, 10:23 AM

## 2023-12-02 NOTE — Progress Notes (Signed)
 [facility] Consultation Progress Note  Patient: Derrick Stokes ZOX:[id number]   PCP: [place], [name], MD DOB: Dec 15, 1950   DOA: 12/01/2023   DOS: 12/02/2023   Date of Service: the patient was seen and examined on 12/02/2023 Primary service: Md, Trauma, MD   Brief Hospital Course: PMH of HTN, HLD, CAD SP CABG, COPD, active smoker, alcohol use-6-8 beers a day, GERD. Presented to the hospital after passing out on 5/13.  Was sitting in the chair stood up and passed out and fell. Reports that 2 weeks ago had another fall when he was working out in the yard and without a warning passed out and fell. Drinks 6-8 beers on a daily basis.  Reports he was not drinking the day he felt this time. Smokes a pack a day. Assessment and Plan: Syncope and collapse. Most likely orthostatic hypotension in the setting of volume loss while working out with possible resultant dehydration. Labs on admission though do not support dehydration.  Blood pressure on admission as well as heart rate was normal. EKG does not show any acute concerning findings. Telemetry so far negative as well. No focal deficit at the time of my evaluation. Echocardiogram shows 55 to 60% EF, no significant valvular abnormality. CT head with intraparenchymal hemorrhage but no other acute abnormalities. Patient is on metoprolol  50 mg daily since December.  No new change in the medications reported. Received IV fluids overnight. Currently on 4 LPM oxygen.  Drops to 80 to 85% on room air. Suspect etiology of his syncope could also be related to undiagnosed hypoxia in the setting of COPD or alcohol abuse. For completion of the workup given recurrent episodes may benefit from outpatient Zio patch placement as well. PT OT consult recommended. Check orthostatic vitals. Will stop fluids given chest x-ray showing concern for vascular congestion.  COPD exacerbation. Appears to have mild exacerbation of his COPD. Will initiate DuoNeb  therapy. Also add Tessalon  Perles, Mucinex  DM for cough If no significant improvement, will consider addition of antibiotics and steroids.  Hypertension. Blood pressure actually stable for now. Heart rate in 60s. Will reduce the dose of the metoprolol  from 50 mg to 25 mg.  HLD. On statin and Zetia .  Resume on discharge.  CAD. History of CABG in 2012. History of PCI 2016. HLD Negative stress test in 2024. On aspirin  and Plavix .  Resumed on 5/15.  Monitor. Monitor on telemetry. Echocardiogram normal.  Less likely cardiac event as a cause of his syncope.  Active smoker. Recommended to quit smoking. Nicotine patch and nicotine gum recommended. Patient willing to quit.  Alcohol abuse. Drinks 6-8 beers on a daily basis. Tells me that he drinks because he lost his family recently. On ciwa protocol. Recommended to reduce his drinking.  Traumatic brain injury with intraparenchymal hemorrhage with surrounding vasogenic edema. Currently admitted under trauma care service. Neurosurgery was consulted. Serial CT scans are negative. Patient started back on Plavix  and aspirin . Neuro trauma service.  Anxiety and depression On gabapentin , Cymbalta . May consider additional treatment to help with grief.  We will continue to follow the patient.    Subjective: Continues to have shortness of breath.  Also has cough.  Reports pain with coughing.  No nausea no vomiting.  Objective: Vitals:   12/01/23 1856 12/01/23 2158 12/02/23 0401 12/02/23 0753  BP:  130/79 117/74 128/76  Pulse:  66 75 64  Resp:  (!) 27 (!) 22 (!) 21  Temp: 97.7 F (36.5 C) (!) 97.5 F (36.4 C) 97.6  F (36.4 C) 97.6 F (36.4 C)  TempSrc:  Oral Oral Oral  SpO2:  94% 94% 95%  Weight:      Height:        General: in moderate distress, No Rash Cardiovascular: S1 and S2 Present, No Murmur Respiratory: Increased respiratory effort, Bilateral Air entry present. No Crackles, bilateral wheezes Abdomen: Bowel Sound  present, No tenderness Extremities: No edema Neuro: Alert and oriented x3, no new focal deficit   Family Communication: Family at bedside  Data Reviewed: Since last encounter, pertinent lab results CBC BMP   . I have ordered test including CBC  . I have discussed pt's care plan and test results with trauma surgery  .   Author: Charlean Congress, MD  [facility] 12/02/2023  11:58 AM  To reach On-call, Look up on care teams to locate the [facility] team or provider name and reach out to them via secure chat or [email or web address] Between 7PM-7AM, please contact night-coverage. If you still have difficulty reaching the attending provider, please page the [facility] (Director on Call) for [facility] on amion for assistance.

## 2023-12-03 ENCOUNTER — Inpatient Hospital Stay (HOSPITAL_COMMUNITY)

## 2023-12-03 DIAGNOSIS — R55 Syncope and collapse: Secondary | ICD-10-CM

## 2023-12-03 LAB — BASIC METABOLIC PANEL WITH GFR
Anion gap: 8 (ref 5–15)
BUN: 5 mg/dL — ABNORMAL LOW (ref 8–23)
CO2: 27 mmol/L (ref 22–32)
Calcium: 8.8 mg/dL — ABNORMAL LOW (ref 8.9–10.3)
Chloride: 104 mmol/L (ref 98–111)
Creatinine, Ser: 0.67 mg/dL (ref 0.61–1.24)
GFR, Estimated: >60 mL/min (ref >60)
Glucose, Bld: 89 mg/dL (ref 70–99)
Potassium: 3.6 mmol/L (ref 3.5–5.1)
Sodium: 139 mmol/L (ref 135–145)

## 2023-12-03 LAB — CBC
HCT: 37.2 % — ABNORMAL LOW (ref 39.0–52.0)
Hemoglobin: 13 g/dL (ref 13.0–17.0)
MCH: 33 pg (ref 26.0–34.0)
MCHC: 34.9 g/dL (ref 30.0–36.0)
MCV: 94.4 fL (ref 80.0–100.0)
Platelets: 179 10*3/uL (ref 150–400)
RBC: 3.94 MIL/uL — ABNORMAL LOW (ref 4.22–5.81)
RDW: 13.6 % (ref 11.5–15.5)
WBC: 8.3 10*3/uL (ref 4.0–10.5)
nRBC: 0 % (ref 0.0–0.2)

## 2023-12-03 MED ORDER — LIDOCAINE 5 % EX PTCH
2.0000 | MEDICATED_PATCH | CUTANEOUS | Status: DC
  Administered 2023-12-03 – 2023-12-06 (×4): 2 via TRANSDERMAL
  Filled 2023-12-03 (×4): qty 2

## 2023-12-03 MED ORDER — DOXYCYCLINE HYCLATE 100 MG PO TABS
100.0000 mg | ORAL_TABLET | Freq: Two times a day (BID) | ORAL | Status: DC
  Administered 2023-12-03 – 2023-12-06 (×7): 100 mg via ORAL
  Filled 2023-12-03 (×8): qty 1

## 2023-12-03 MED ORDER — METHYLPREDNISOLONE SODIUM SUCC 125 MG IJ SOLR
40.0000 mg | Freq: Once | INTRAMUSCULAR | Status: AC
  Administered 2023-12-03: 40 mg via INTRAVENOUS
  Filled 2023-12-03: qty 2

## 2023-12-03 MED ORDER — PREDNISONE 5 MG PO TABS
50.0000 mg | ORAL_TABLET | Freq: Every day | ORAL | Status: DC
  Administered 2023-12-04: 50 mg via ORAL
  Filled 2023-12-03: qty 2

## 2023-12-03 MED ORDER — FUROSEMIDE 20 MG PO TABS
20.0000 mg | ORAL_TABLET | Freq: Once | ORAL | Status: AC
  Administered 2023-12-03: 20 mg via ORAL
  Filled 2023-12-03: qty 1

## 2023-12-03 NOTE — Progress Notes (Signed)
 [facility] Consultation Progress Note  Patient: Derrick Stokes:[id number]   PCP: [place], [name], MD DOB: [birth year]   DOA: 12/01/2023   DOS: 12/03/2023   Date of Service: the patient was seen and examined on 12/03/2023 Primary service: Md, Trauma, MD   Brief Hospital Course: PMH of HTN, HLD, CAD SP CABG, COPD, active smoker, alcohol use-6-8 beers a day, GERD. Presented to the hospital after passing out on 5/13.  Was sitting in the chair stood up and passed out and fell. Reports that 2 weeks ago had another fall when he was working out in the yard and without a warning passed out and fell. Drinks 6-8 beers on a daily basis.  Reports he was not drinking the day he felt this time. Smokes a pack a day. Assessment and Plan: Syncope and collapse. Most likely orthostatic hypotension in the setting of volume loss while working out with possible resultant dehydration. Labs on admission though do not support dehydration. Blood pressure on admission as well as heart rate was normal. EKG does not show any acute concerning findings. Telemetry so far negative as well. No focal deficit at the time of my evaluation. Echocardiogram shows 55 to 60% EF, no significant valvular abnormality. CT head with intraparenchymal hemorrhage but no other acute abnormalities. Patient is on metoprolol  50 mg daily since December.  No new change in the medications reported. Currently on 4 LPM oxygen. Sats drops to 80 to 85% on room air. Suspect etiology of his syncope could also be related to undiagnosed hypoxia in the setting of COPD or alcohol abuse. For completion of the workup given recurrent episodes may benefit from outpatient Zio patch placement as well. PT OT consult recommended home health. Negative orthostatic vitals. No further workup in the hospital.   COPD exacerbation. Appears to have mild exacerbation of his COPD. No significant change after initiation of DuoNeb therapy. Continue with cough  suppression medication. Add antibiotics and steroids.   Hypertension. Blood pressure actually stable for now. Heart rate in 60s. Will reduce the dose of the metoprolol  from 50 mg to 25 mg.  HLD. On statin and Zetia .  Resume on discharge.   CAD. History of CABG in 2012. History of PCI 2016. HLD Negative stress test in 2024. On aspirin  and Plavix .  Resumed on 5/15.  Monitor. Echocardiogram normal.  Less likely cardiac event as a cause of his syncope.   Active smoker. Recommended to quit smoking. Nicotine patch and nicotine gum recommended. Patient willing to quit.   Alcohol abuse. Drinks 6-8 beers on a daily basis. Tells me that he drinks because he lost his family recently. On ciwa protocol. Recommended to reduce his drinking.   Traumatic brain injury with intraparenchymal hemorrhage with surrounding vasogenic edema. Currently admitted under trauma care service. Neurosurgery was consulted. Serial CT scans are negative. Patient started back on Plavix  and aspirin .   Anxiety and depression On gabapentin , Cymbalta . May consider additional treatment to help with grief.   We will continue to follow the patient. Still on clear liquid diet.  Management per surgery.  Subjective: Continues to have cough.  No nausea or vomiting.  Pain Still present.  Objective: Vitals:   12/02/23 2029 12/03/23 0743 12/03/23 1200 12/03/23 1600  BP:  111/77 104/70 106/71  Pulse: 60 67 66 63  Resp: 19 16 20 20   Temp:  97.8 F (36.6 C) 98 F (36.7 C) 97.6 F (36.4 C)  TempSrc:  Oral Oral Oral  SpO2:  95% 95% 95%  Weight:      Height:        General: in moderate distress, No Rash Cardiovascular: S1 and S2 Present, No Murmur Respiratory: Increased respiratory effort, Bilateral Air entry present. No Crackles, bilateral wheezes Abdomen: Bowel Sound present, No tenderness Extremities: No edema Neuro: Alert and oriented x3, no new focal deficit   Family Communication: No one at  bedside.  Data Reviewed: Since last encounter, pertinent lab results CBC and BMP   . I have ordered test including CBC and BMP  .   Author: Charlean Congress, MD  [facility] 12/03/2023  5:51 PM  To reach On-call, Look up on care teams to locate the [facility] team or provider name and reach out to them via secure chat or [email or web address] Between 7PM-7AM, please contact night-coverage. If you still have difficulty reaching the attending provider, please page the [facility] (Director on Call) for [facility] on amion for assistance.

## 2023-12-03 NOTE — Progress Notes (Signed)
 Transition of Care [facility]) - CAGE-AID Screening   Patient Details  Name: Derrick Stokes MRN: [id number] Date of Birth: [birth year]   Asa Bjork, RN Trauma Response Nurse Phone Number: [phone] 12/03/2023, 5:51 PM    CAGE-AID Screening:    Have You Ever Felt You Ought to Cut Down on Your Drinking or Drug Use?: Yes Have People Annoyed You By Critizing Your Drinking Or Drug Use?: No Have You Felt Bad Or Guilty About Your Drinking Or Drug Use?: Yes Have You Ever Had a Drink or Used Drugs First Thing In The Morning to Steady Your Nerves or to Get Rid of a Hangover?: No CAGE-AID Score: 2  Substance Abuse Education Offered: (S) Yes (Please give information at discharge- admits to drinking 6-8 beers a day. On CIWA protocol currently)

## 2023-12-03 NOTE — Hospital Course (Addendum)
 PMH of HTN, HLD, CAD SP CABG, COPD, active smoker, alcohol use-6-8 beers a day, GERD. Presented to the hospital after passing out on 5/13.  Was sitting in the chair stood up and passed out and fell. Reports that 2 weeks ago had another fall when he was working out in the yard and without a warning passed out and fell. Drinks 6-8 beers on a daily basis.  Reports he was not drinking the day he felt this time. Smokes a pack a day. Assessment and Plan: Syncope and collapse. Most likely combination of hypoxia and orthostasis. Labs on admission though do not support dehydration. Blood pressure on admission as well as heart rate was normal. EKG does not show any acute concerning findings. Telemetry so far negative as well. No focal deficit at the time of my evaluation. Echocardiogram shows 55 to 60% EF, no significant valvular abnormality. CT head with intraparenchymal hemorrhage but no other acute abnormalities. No new change in the medications reported. Currently on 4 LPM oxygen. Sats drops to 80 to 85% on room air. Negative orthostatic vitals. Suspect etiology of his syncope could also be related to undiagnosed hypoxia in the setting of COPD or alcohol abuse. For completion of the workup given recurrent episodes may benefit from outpatient Zio patch placement as well. PT OT consult recommended home health. No further workup in the hospital.   COPD exacerbation.  Improving. Appears to have mild exacerbation of his COPD. Currently on DuoNebs, antibiotic and steroid. Recommend to continue inhalers, steroid taper and antibiotic on discharge. I have ordered oxygen therapy for home as well. Have more wheezing.  Continue nebulizer therapy.  Will order nebulizer for home as well.  Hypertension. Blood pressure stable. Heart rate in 60s on metoprolol  25 mg. Home dose of metoprolol  is 50 mg which could have contributed to the syncope. On discharge we will continue metoprolol  25 mg  only.  HLD. On statin and Zetia .  Resume on discharge.   CAD. History of CABG in 2012. History of PCI 2016. HLD Negative stress test in 2024. On aspirin  and Plavix .  Resumed on 5/15.  Monitor. Echocardiogram normal.  Less likely cardiac event as a cause of his syncope.   Active smoker. Recommended to quit smoking. Nicotine patch and nicotine gum recommended. Patient willing to quit.   Alcohol abuse. Drinks 6-8 beers on a daily basis. Tells me that he drinks because he lost his family recently. On ciwa protocol. Recommended to reduce his drinking.  Traumatic brain injury with intraparenchymal hemorrhage with surrounding vasogenic edema. Currently admitted under trauma care service. Neurosurgery was consulted. Serial CT scans are negative. Patient started back on Plavix  and aspirin .   Anxiety and depression On gabapentin , Cymbalta . Follow-up with PCP outpatient.  Chest pain. Reported some chest pain on 5/18 which felt like it chest tightness to RN. To me he reported that the chest pain was more related to his rib fracture. Troponins are negative.  EKG unremarkable.  Chest x-ray negative for any new findings. No further workup.  Left arm numbness. CT of the head was performed which was negative for any acute abnormality. No focal deficit exam of my evaluation. Symptoms are improving. Monitor.

## 2023-12-03 NOTE — Plan of Care (Signed)
  Problem: Clinical Measurements: Goal: Will remain free from infection 12/03/2023 0458 by Venetta Gill, RN Outcome: Progressing 12/03/2023 0458 by Venetta Gill, RN Outcome: Progressing

## 2023-12-03 NOTE — TOC CM/SW Note (Signed)
 Transition of Care [facility]) - Inpatient Brief Assessment   Patient Details  Name: Derrick Stokes MRN: [id number] Date of Birth: 08-17-1950  Transition of Care [facility]) CM/SW Contact:    Dorice Stiggers M, RN Phone Number: 12/03/2023, 5:11 PM   Clinical Narrative: Pt is a 73 y/o male presenting on 5/14 after syncope and fall. Found with L rib fxs 2-8, CT with 11mm intraparenchymal hemorrhage without midline shift.  PT and ST recommending OP follow up; referrals made to [facility] for continued therapies. Patient reports having good support from family.    Transition of Care Asessment: Insurance and Status: Insurance coverage has been reviewed Patient has primary care physician: Yes Home environment has been reviewed: Lives with family Prior level of function:: Independent Prior/Current Home Services: No current home services Social Drivers of Health Review: SDOH reviewed needs interventions Readmission risk has been reviewed: Yes Transition of care needs: no transition of care needs at this time  Calla Catchings, RN, BSN  Trauma/Neuro ICU Case Manager [phone]

## 2023-12-03 NOTE — Progress Notes (Signed)
 VASCULAR LAB    Carotid duplex has been performed.  See CV proc for preliminary results.   Beila Purdie, RVT 12/03/2023, 11:21 AM

## 2023-12-03 NOTE — Evaluation (Signed)
 Physical Therapy Evaluation Patient Details Name: Derrick Stokes MRN: [id number] DOB: Apr 01, 1951 Today's Date: 12/03/2023  History of Present Illness  Pt is a 73 y/o male presenting on 5/14 after syncope and fall. Found with L rib fxs 2-8, CT with 11mm intraparenchymal hemorrhage without midline shift. Serial CT stable. PMH: HTN, HLD, GERD, CAD s/p CABG 2012 with subsequent DES to LCx and RCA in 2016, COPD, tobacco abuse, alcohol abuse    Clinical Impression  Pt presents with condition above and deficits mentioned below, see PT Problem List. PTA, he was independent without DME, living with his family in a 1-level house with 5 STE. He endorses a couple falls in the past couple weeks. It is unclear what caused his falls and it may be beneficial to gather this information in future sessions. Currently, the pt is limited by L rib pain, needing extra time for all functional mobility. He was able to perform bed mobility, transfers, and ambulate a short distance in the room without UE support with only CGA for safety this date. Educated pt on splinting ribs by hugging pillow as needed and use of IS and sitting upright to prevent PNA and improve pulmonary function. He likely will progress well and therefore may not need post-acute PT, but if he is having frequent falls he may benefit from OPPT to address his deficits to reduce his risk for subsequent falls and injuries. Will continue to follow acutely.  SpO2 down to as low as 86% on RA sitting at rest, SpO2 >/= 92% on 4L O2 via [place]  Orthostatics -  106/72 & 73 bpm supine 114/83 & 80 bpm sitting 101/79 & 81 bpm standing         If plan is discharge home, recommend the following: A little help with bathing/dressing/bathroom;Assistance with cooking/housework;Assist for transportation;Help with stairs or ramp for entrance;A little help with walking and/or transfers   Can travel by private vehicle        Equipment Recommendations None recommended by PT   Recommendations for Other Services       Functional Status Assessment Patient has had a recent decline in their functional status and demonstrates the ability to make significant improvements in function in a reasonable and predictable amount of time.     Precautions / Restrictions Precautions Precautions: Fall Recall of Precautions/Restrictions: Intact Precaution/Restrictions Comments: watch SpO2 Restrictions Weight Bearing Restrictions Per Provider Order: No      Mobility  Bed Mobility Overal bed mobility: Needs Assistance Bed Mobility: Rolling, Sidelying to Sit Rolling: Contact guard assist Sidelying to sit: Contact guard assist, HOB elevated       General bed mobility comments: Extra time and cuing needed for pt to exit R EOB with HOB elevated, CGA for safety    Transfers Overall transfer level: Needs assistance Equipment used: None Transfers: Sit to/from Stand, Bed to chair/wheelchair/BSC Sit to Stand: Contact guard assist   Step pivot transfers: Contact guard assist       General transfer comment: Pt able to transfer to stand from EOB with CGA for safety and no LOB. Pt able to take pivotal steps to R from bed to recliner without UE support or LOB, CGA for safety    Ambulation/Gait Ambulation/Gait assistance: Contact guard assist Gait Distance (Feet): 5 Feet Assistive device: None Gait Pattern/deviations: Step-through pattern, Decreased stride length Gait velocity: reduced Gait velocity interpretation: <1.8 ft/sec, indicate of risk for recurrent falls   General Gait Details: Pt able to ambulate within the room with  a step-through gait pattern, no LOB noted, CGA for safety. Distance limited by pain this date.  Stairs            Wheelchair Mobility     Tilt Bed    Modified Rankin (Stroke Patients Only)       Balance Overall balance assessment: Mild deficits observed, not formally tested                                            Pertinent Vitals/Pain Pain Assessment Pain Assessment: Faces Faces Pain Scale: Hurts whole lot Pain Location: ribs during movement or coughing Pain Descriptors / Indicators: Discomfort, Grimacing, Guarding Pain Intervention(s): Limited activity within patient's tolerance, Monitored during session, Repositioned    Home Living Family/patient expects to be discharged to:: Private residence Living Arrangements: Children Available Help at Discharge: Family;Available PRN/intermittently (most of the time) Type of Home: House Home Access: Stairs to enter Entrance Stairs-Rails: Left Entrance Stairs-Number of Steps: 5   Home Layout: One level (x1 small step up from back room to rest of house) Home Equipment: Grab bars - tub/shower;Grab bars - toilet;Rolling Walker (2 wheels);Cane - single point;Shower seat;BSC/3in1      Prior Function Prior Level of Function : Independent/Modified Independent             Mobility Comments: No AD; a couple falls in past couple weeks ADLs Comments: does not drive; cooks and cleans, but not often per pt     Extremity/Trunk Assessment   Upper Extremity Assessment Upper Extremity Assessment: Defer to OT evaluation    Lower Extremity Assessment Lower Extremity Assessment: Overall WFL for tasks assessed (MMT scores of 5/5 grossly bil; denied numbness/tingling bil)    Cervical / Trunk Assessment Cervical / Trunk Assessment: Other exceptions Cervical / Trunk Exceptions: rib fxs  Communication   Communication Communication: Impaired Factors Affecting Communication: Hearing impaired    Cognition Arousal: Alert Behavior During Therapy: WFL for tasks assessed/performed   PT - Cognitive impairments: No apparent impairments                       PT - Cognition Comments: follows commands appropriately at this time, some delay may be due to [facility] Following commands: Intact       Cueing Cueing Techniques: Verbal cues     General  Comments General comments (skin integrity, edema, etc.): SpO2 down to as low as 86% on RA sitting at rest, SpO2 >/= 92% on 4L O2 via [place]; orthostatics - 106/72 & 73 bpm supine, 114/83 & 80 bpm sitting, 101/79 & 81 bpm standing; educated pt on splinting ribs by hugging pillow as needed and use of IS and sitting upright to prevent PNA and improve pulmonary function    Exercises     Assessment/Plan    PT Assessment Patient needs continued PT services  PT Problem List Decreased activity tolerance;Decreased balance;Decreased mobility;Cardiopulmonary status limiting activity;Pain       PT Treatment Interventions DME instruction;Gait training;Stair training;Functional mobility training;Therapeutic activities;Therapeutic exercise;Balance training;Neuromuscular re-education;Patient/family education    PT Goals (Current goals can be found in the Care Plan section)  Acute Rehab PT Goals Patient Stated Goal: to reduce pain PT Goal Formulation: With patient Time For Goal Achievement: 12/17/23 Potential to Achieve Goals: Good    Frequency Min 1X/week     Co-evaluation  AM-PAC PT "6 Clicks" Mobility  Outcome Measure Help needed turning from your back to your side while in a flat bed without using bedrails?: A Little Help needed moving from lying on your back to sitting on the side of a flat bed without using bedrails?: A Little Help needed moving to and from a bed to a chair (including a wheelchair)?: A Little Help needed standing up from a chair using your arms (e.g., wheelchair or bedside chair)?: A Little Help needed to walk in hospital room?: A Little Help needed climbing 3-5 steps with a railing? : A Little 6 Click Score: 18    End of Session Equipment Utilized During Treatment: Oxygen Activity Tolerance: Patient tolerated treatment well;Patient limited by pain Patient left: in chair;with call bell/phone within reach;with chair alarm set Nurse Communication: Mobility  status PT Visit Diagnosis: Unsteadiness on feet (R26.81);Other abnormalities of gait and mobility (R26.89);History of falling (Z91.81);Repeated falls (R29.6);Pain Pain - Right/Left: Left Pain - part of body:  (ribs)    Time: 9147-8295 PT Time Calculation (min) (ACUTE ONLY): 17 min   Charges:   PT Evaluation $PT Eval Moderate Complexity: 1 Mod   PT General Charges $$ ACUTE PT VISIT: 1 Visit         Vernida Goodie, PT, DPT Acute Rehabilitation Services  Office: [phone]   Ellyn Hack 12/03/2023, 4:49 PM

## 2023-12-03 NOTE — Progress Notes (Addendum)
 Subjective: CC: Left rib pain that is well controlled. Still with sob. On 4L. Does not wear o2 at home. Productive cough with clear sputum. Tolerating cld without n/v. No bm. Voiding. Mobilizing to the bathroom.   Lives at home with his son. Does not use assistive devices at baseline.   Afebrile. No tachycardia or hypotension. WBC wnl. Hgb stable. Cr wnl.   Objective: Vital signs in last 24 hours: Temp:  [97.8 F (36.6 C)-98.2 F (36.8 C)] 97.8 F (36.6 C) (05/16 0743) Pulse Rate:  [60-75] 67 (05/16 0743) Resp:  [15-20] 16 (05/16 0743) BP: (108-114)/(68-79) 111/77 (05/16 0743) SpO2:  [93 %-95 %] 95 % (05/16 0743) Last BM Date :  (PTA)  Intake/Output from previous day: 05/15 0701 - 05/16 0700 In: 480 [P.O.:480] Out: 700 [Urine:700] Intake/Output this shift: Total I/O In: 580 [P.O.:580] Out: -   PE: Gen:  Alert, NAD, pleasant HEENT: EOM's intact, pupils equal and round Card:  Reg rate Pulm:  B/l wheezing and rhonci. No rales. Pulling 750 on IS. On 4L.  Abd: Soft, ND, NT Ext:  No LE edema Neuro: CN 3-12 grossly intact, MAE's, f/c, non-focal  Lab Results:  Recent Labs    12/02/23 0630 12/03/23 0552  WBC 9.3 8.3  HGB 13.6 13.0  HCT 38.2* 37.2*  PLT 183 179   BMET Recent Labs    12/02/23 0630 12/03/23 0552  NA 135 139  K 3.6 3.6  CL 103 104  CO2 24 27  GLUCOSE 108* 89  BUN 11 5*  CREATININE 0.60* 0.67  CALCIUM  9.0 8.8*   PT/INR No results for input(s): "LABPROT", "INR" in the last 72 hours. CMP     Component Value Date/Time   NA 139 12/03/2023 0552   NA 136 10/07/2020 0930   K 3.6 12/03/2023 0552   CL 104 12/03/2023 0552   CO2 27 12/03/2023 0552   GLUCOSE 89 12/03/2023 0552   BUN 5 (L) 12/03/2023 0552   BUN 12 10/07/2020 0930   CREATININE 0.67 12/03/2023 0552   CREATININE 0.84 02/05/2020 1417   CALCIUM  8.8 (L) 12/03/2023 0552   PROT 8.2 (H) 12/01/2023 1616   PROT 7.0 03/08/2019 0905   ALBUMIN 4.3 12/01/2023 1616   ALBUMIN 4.3  03/08/2019 0905   AST 33 12/01/2023 1616   ALT 30 12/01/2023 1616   ALKPHOS 113 12/01/2023 1616   BILITOT 1.8 (H) 12/01/2023 1616   BILITOT 0.9 03/08/2019 0905   GFRNONAA >60 12/03/2023 0552   GFRAA 109 03/08/2019 0905   Lipase     Component Value Date/Time   LIPASE 24 09/18/2023 0320    Studies/Results: ECHOCARDIOGRAM COMPLETE Result Date: 12/02/2023    ECHOCARDIOGRAM REPORT   Patient Name:   RAOUL SOMERO Date of Exam: 12/02/2023 Medical Rec #:  [id number]        Height:       67.0 in Accession #:    [id number]       Weight:       172.0 lb Date of Birth:  1950-12-29         BSA:          1.897 m Patient Age:    73 years         BP:           117/74 mmHg Patient Gender: M                HR:  63 bpm. Exam Location:  Inpatient Procedure: 2D Echo, Cardiac Doppler and Color Doppler (Both Spectral and Color            Flow Doppler were utilized during procedure). Indications:    R55 Syncope  History:        Patient has prior history of Echocardiogram examinations, most                 recent 05/02/2015.  Sonographer:    Andrena Bang Referring Phys: [id number] Haydee Lipa  Sonographer Comments: Pt in suboptimal position due to broken ribs. Unable to do Definity. IMPRESSIONS  1. Left ventricular ejection fraction, by estimation, is 55 to 60%. The left ventricle has normal function. The left ventricle has no regional wall motion abnormalities. There is mild left ventricular hypertrophy of the basal-septal segment. Left ventricular diastolic parameters were normal.  2. Right ventricular systolic function is normal. The right ventricular size is normal. Tricuspid regurgitation signal is inadequate for assessing PA pressure.  3. The mitral valve is degenerative. Mild mitral valve regurgitation. No evidence of mitral stenosis.  4. The aortic valve is calcified. There is moderate calcification of the aortic valve. Aortic valve regurgitation is not visualized. Aortic valve sclerosis/calcification is  present, without any evidence of aortic stenosis. Aortic valve area, by VTI measures 2.53 cm. Aortic valve mean gradient measures 3.0 mmHg. Aortic valve Vmax measures 1.24 m/s.  5. Aortic dilatation noted. There is borderline dilatation of the ascending aorta, measuring 37 mm. FINDINGS  Left Ventricle: Left ventricular ejection fraction, by estimation, is 55 to 60%. The left ventricle has normal function. The left ventricle has no regional wall motion abnormalities. The left ventricular internal cavity size was normal in size. There is  mild left ventricular hypertrophy of the basal-septal segment. Left ventricular diastolic parameters were normal. Normal left ventricular filling pressure. Right Ventricle: The right ventricular size is normal. No increase in right ventricular wall thickness. Right ventricular systolic function is normal. Tricuspid regurgitation signal is inadequate for assessing PA pressure. Left Atrium: Left atrial size was normal in size. Right Atrium: Right atrial size was normal in size. Pericardium: There is no evidence of pericardial effusion. Mitral Valve: The mitral valve is degenerative in appearance. There is mild calcification of the mitral valve leaflet(s). Mild mitral valve regurgitation. No evidence of mitral valve stenosis. Tricuspid Valve: The tricuspid valve is normal in structure. Tricuspid valve regurgitation is mild . No evidence of tricuspid stenosis. Aortic Valve: The aortic valve is calcified. There is moderate calcification of the aortic valve. Aortic valve regurgitation is not visualized. Aortic valve sclerosis/calcification is present, without any evidence of aortic stenosis. Aortic valve mean gradient measures 3.0 mmHg. Aortic valve peak gradient measures 6.2 mmHg. Aortic valve area, by VTI measures 2.53 cm. Pulmonic Valve: The pulmonic valve was normal in structure. Pulmonic valve regurgitation is trivial. No evidence of pulmonic stenosis. Aorta: Aortic dilatation  noted. There is borderline dilatation of the ascending aorta, measuring 37 mm. Venous: The inferior vena cava was not well visualized. IAS/Shunts: No atrial level shunt detected by color flow Doppler.  LEFT VENTRICLE PLAX 2D LVIDd:         3.80 cm     Diastology LVIDs:         2.90 cm     LV e' medial:    9.03 cm/s LV PW:         1.00 cm     LV E/e' medial:  10.1 LV IVS:  1.20 cm     LV e' lateral:   13.60 cm/s LVOT diam:     2.20 cm     LV E/e' lateral: 6.7 LV SV:         67 LV SV Index:   35 LVOT Area:     3.80 cm  LV Volumes (MOD) LV vol d, MOD A2C: 79.2 ml LV vol d, MOD A4C: 90.2 ml LV vol s, MOD A2C: 23.5 ml LV vol s, MOD A4C: 36.0 ml LV SV MOD A2C:     55.7 ml LV SV MOD A4C:     90.2 ml LV SV MOD BP:      55.0 ml RIGHT VENTRICLE RV S prime:     7.83 cm/s LEFT ATRIUM             Index LA diam:        3.80 cm 2.00 cm/m LA Vol (A2C):   41.1 ml 21.67 ml/m LA Vol (A4C):   49.6 ml 26.15 ml/m LA Biplane Vol: 45.3 ml 23.88 ml/m  AORTIC VALVE AV Area (Vmax):    3.22 cm AV Area (Vmean):   2.39 cm AV Area (VTI):     2.53 cm AV Vmax:           124.00 cm/s AV Vmean:          89.000 cm/s AV VTI:            0.264 m AV Peak Grad:      6.2 mmHg AV Mean Grad:      3.0 mmHg LVOT Vmax:         105.00 cm/s LVOT Vmean:        56.000 cm/s LVOT VTI:          0.176 m LVOT/AV VTI ratio: 0.67  AORTA Ao Asc diam: 3.70 cm MITRAL VALVE MV Area (PHT): 4.57 cm    SHUNTS MV Decel Time: 166 msec    Systemic VTI:  0.18 m MV E velocity: 91.20 cm/s  Systemic Diam: 2.20 cm MV A velocity: 93.80 cm/s MV E/A ratio:  0.97 Gaylyn Keas MD Electronically signed by Gaylyn Keas MD Signature Date/Time: 12/02/2023/10:35:02 AM    Final    DG CHEST PORT 1 VIEW Result Date: 12/02/2023 CLINICAL DATA:  Rib fractures. EXAM: PORTABLE CHEST 1 VIEW COMPARISON:  03/18/2018 FINDINGS: Low volume film. The cardio pericardial silhouette is enlarged. There is pulmonary vascular congestion without overt pulmonary edema. Bibasilar atelectasis with small  bilateral pleural effusions. Bones are diffusely demineralized. Known left-sided anterior rib fractures not well demonstrated. Telemetry leads overlie the chest. IMPRESSION: 1. Low volume film with bibasilar atelectasis and small bilateral pleural effusions. No evidence for pneumothorax. 2. Enlargement of the cardiopericardial silhouette with pulmonary vascular congestion. 3. Known anterior left rib fractures not well demonstrated on this study. Electronically Signed   By: Donnal Fusi M.D.   On: 12/02/2023 05:44   CT HEAD WO CONTRAST ( ) Result Date: 12/02/2023 CLINICAL DATA:  Fall EXAM: CT HEAD WITHOUT CONTRAST TECHNIQUE: Contiguous axial images were obtained from the base of the skull through the vertex without intravenous contrast. RADIATION DOSE REDUCTION: This exam was performed according to the departmental dose-optimization program which includes automated exposure control, adjustment of the mA and/or kV according to patient size and/or use of iterative reconstruction technique. COMPARISON:  12/01/2023 FINDINGS: Brain: Unchanged appearance of hemorrhagic focus within the superior left frontal lobe. There is generalized volume loss. There is hypoattenuation of the bilateral supratentorial white matter.  Vascular: There is atherosclerotic calcification of both internal carotid arteries at the skull base. Skull: No skull fracture Sinuses/Orbits: Mild mucosal thickening of sphenoid sinuses. Normal orbits. Other: None IMPRESSION: 1. Unchanged appearance of hemorrhagic focus within the superior left frontal lobe. 2. No new acute intracranial abnormality. Electronically Signed   By: Juanetta Nordmann M.D.   On: 12/02/2023 03:48   CT Head Wo Contrast Result Date: 12/01/2023 CLINICAL DATA:  Fall with head trauma EXAM: CT HEAD WITHOUT CONTRAST CT CERVICAL SPINE WITHOUT CONTRAST TECHNIQUE: Multidetector CT imaging of the head and cervical spine was performed following the standard protocol without intravenous  contrast. Multiplanar CT image reconstructions of the cervical spine were also generated. RADIATION DOSE REDUCTION: This exam was performed according to the departmental dose-optimization program which includes automated exposure control, adjustment of the mA and/or kV according to patient size and/or use of iterative reconstruction technique. COMPARISON:  None Available. FINDINGS: CT HEAD FINDINGS Brain: Negative for intracranial mass. Atrophy and moderate severe chronic small vessel ischemic changes of the white matter. Prominent ventricles felt secondary to atrophy. Focal intraparenchymal hemorrhage at the left frontal vertex, this measures 1.1 x 0.8 x 0.8 cm, estimated volume of less than 1 mL. Mild surrounding hypodense edema. No significant mass effect or midline shift. Vascular: No hyperdense vessels.  Carotid vascular calcifications. Skull: No definitive fracture Sinuses/Orbits: Moderate mucosal thickening in the sinuses Other: Traumatic Brain Injury Risk Stratification Skull Fracture: No - Low/mBIG 1 Subdural Hematoma (SDH): No - Low Subarachnoid Hemorrhage [facility]): No Epidural Hematoma (EDH): No - Low/mBIG 1 Cerebral contusion, intra-axial, intraparenchymal Hemorrhage (IPH): No Intraventricular Hemorrhage (IVH): No - Low/mBIG 1 Midline Shift > 1mm or Edema/effacement of sulci/vents: No - Low/mBIG 1 ---------------------------------------------------- CT CERVICAL SPINE FINDINGS Alignment: Trace anterolisthesis C2 on C3. Facet alignment is within normal limits. Skull base and vertebrae: No acute fracture. No primary bone lesion or focal pathologic process. Soft tissues and spinal canal: No prevertebral fluid or swelling. No visible canal hematoma. Disc levels: Mild disc space narrowing at C3-C4. Multilevel mild facet degenerative changes. Upper chest: See separately dictated chest CT Other: None IMPRESSION: 1. 1.1 cm intraparenchymal hemorrhage at the left frontal vertex with mild surrounding edema. No  significant mass effect or midline shift. 2. Atrophy and chronic small vessel ischemic changes of the white matter. 3. No CT evidence for acute osseous abnormality of the cervical spine. Critical Value/emergent results were called by telephone at the time of interpretation on 12/01/2023 at 5:10 pm to provider HAYLEY NAASZ , who verbally acknowledged these results. Electronically Signed   By: Esmeralda Hedge M.D.   On: 12/01/2023 17:34   CT Cervical Spine Wo Contrast Result Date: 12/01/2023 CLINICAL DATA:  Fall with head trauma EXAM: CT HEAD WITHOUT CONTRAST CT CERVICAL SPINE WITHOUT CONTRAST TECHNIQUE: Multidetector CT imaging of the head and cervical spine was performed following the standard protocol without intravenous contrast. Multiplanar CT image reconstructions of the cervical spine were also generated. RADIATION DOSE REDUCTION: This exam was performed according to the departmental dose-optimization program which includes automated exposure control, adjustment of the mA and/or kV according to patient size and/or use of iterative reconstruction technique. COMPARISON:  None Available. FINDINGS: CT HEAD FINDINGS Brain: Negative for intracranial mass. Atrophy and moderate severe chronic small vessel ischemic changes of the white matter. Prominent ventricles felt secondary to atrophy. Focal intraparenchymal hemorrhage at the left frontal vertex, this measures 1.1 x 0.8 x 0.8 cm, estimated volume of less than 1 mL. Mild surrounding hypodense edema. No  significant mass effect or midline shift. Vascular: No hyperdense vessels.  Carotid vascular calcifications. Skull: No definitive fracture Sinuses/Orbits: Moderate mucosal thickening in the sinuses Other: Traumatic Brain Injury Risk Stratification Skull Fracture: No - Low/mBIG 1 Subdural Hematoma (SDH): No - Low Subarachnoid Hemorrhage [facility]): No Epidural Hematoma (EDH): No - Low/mBIG 1 Cerebral contusion, intra-axial, intraparenchymal Hemorrhage (IPH): No  Intraventricular Hemorrhage (IVH): No - Low/mBIG 1 Midline Shift > 1mm or Edema/effacement of sulci/vents: No - Low/mBIG 1 ---------------------------------------------------- CT CERVICAL SPINE FINDINGS Alignment: Trace anterolisthesis C2 on C3. Facet alignment is within normal limits. Skull base and vertebrae: No acute fracture. No primary bone lesion or focal pathologic process. Soft tissues and spinal canal: No prevertebral fluid or swelling. No visible canal hematoma. Disc levels: Mild disc space narrowing at C3-C4. Multilevel mild facet degenerative changes. Upper chest: See separately dictated chest CT Other: None IMPRESSION: 1. 1.1 cm intraparenchymal hemorrhage at the left frontal vertex with mild surrounding edema. No significant mass effect or midline shift. 2. Atrophy and chronic small vessel ischemic changes of the white matter. 3. No CT evidence for acute osseous abnormality of the cervical spine. Critical Value/emergent results were called by telephone at the time of interpretation on 12/01/2023 at 5:10 pm to provider HAYLEY NAASZ , who verbally acknowledged these results. Electronically Signed   By: Esmeralda Hedge M.D.   On: 12/01/2023 17:34   CT Thoracic Spine Wo Contrast Result Date: 12/01/2023 CLINICAL DATA:  Back trauma. EXAM: CT THORACIC SPINE WITHOUT CONTRAST TECHNIQUE: Multidetector CT images of the thoracic were obtained using the standard protocol without intravenous contrast. RADIATION DOSE REDUCTION: This exam was performed according to the departmental dose-optimization program which includes automated exposure control, adjustment of the mA and/or kV according to patient size and/or use of iterative reconstruction technique. COMPARISON:  Chest CT dated 08/08/2021. FINDINGS: Alignment: No acute subluxation. Vertebrae: No acute fracture. The bones are osteopenic. Multilevel old compression fractures most prominent at T6 and T8. Paraspinal and other soft tissues: Negative. Disc levels:  Multilevel degenerative changes. IMPRESSION: 1. No acute/traumatic thoracic spine pathology. 2. Multilevel old compression fractures. Electronically Signed   By: Angus Bark M.D.   On: 12/01/2023 16:58   CT Chest Wo Contrast Result Date: 12/01/2023 CLINICAL DATA:  Chest trauma.  Fall. EXAM: CT CHEST WITHOUT CONTRAST TECHNIQUE: Multidetector CT imaging of the chest was performed following the standard protocol without IV contrast. RADIATION DOSE REDUCTION: This exam was performed according to the departmental dose-optimization program which includes automated exposure control, adjustment of the mA and/or kV according to patient size and/or use of iterative reconstruction technique. COMPARISON:  Chest CT dated 08/08/2021. FINDINGS: Evaluation of this exam is limited in the absence of intravenous contrast. Cardiovascular: There is no cardiomegaly or pericardial effusion. There is coronary vascular calcification and postsurgical changes of CABG. Mild atherosclerotic calcification of the thoracic aorta. No aneurysmal dilatation. The central pulmonary arteries are grossly unremarkable. Mediastinum/Nodes: No hilar or mediastinal adenopathy. Small hiatal hernia. The esophagus and the thyroid  gland are grossly unremarkable. No mediastinal fluid collection. Lungs/Pleura: Left lung base subpleural atelectasis. No focal consolidation, pleural effusion, or pneumothorax. The central airways are patent. Upper Abdomen: No acute abnormality. Musculoskeletal: Median sternotomy wires. Nondisplaced fractures of the anterior left 2nd-8th ribs. There is osteopenia with degenerative changes of the spine. Multilevel old appearing compression fractures of the thoracic spine. IMPRESSION: 1. Nondisplaced fractures of the anterior left 2nd-8th ribs. No pneumothorax. 2.  Aortic Atherosclerosis (ICD10-I70.0). Electronically Signed   By: Marene Shape.D.  On: 12/01/2023 16:55    Anti-infectives: Anti-infectives (From admission,  onward)    Start     Dose/Rate Route Frequency Ordered Stop   12/03/23 1045  doxycycline  (VIBRA -TABS) tablet 100 mg        100 mg Oral Every 12 hours 12/03/23 0950          Assessment/Plan Syncope with GLF   TBI/27mm intraparenchymal hemorrhage - per Dr. Larrie Po, seriel CT H stable, OK to resume ASA and Plavix . Cont Keppra. Plan TBI team therapies L rib fxs 2-8 - multimodal pain control, IS 10x/hr while awake, PRN O2, F/U CXR no PTX EtOH use - CIWA, CAGE-AID Syncope - Appreciate TRH evaluation and workup Hypertension - TRH reduced Metoprolol   History of CAD status post CABG, DES - back on ASA/Plavix   Aortic atherosclerosis Tobacco abuse GERD Multilevel remote thoracic compression fractures  Hypoxia - TRH tx for COPD exacerbation w/ Doxy and steroids. Also giving lasix for fluid overload on CXR. Wean o2 as able. Cont pulm toilet.   FEN - On CLD. Can have reg diet from our standpoint VTE - SCDs, ASA/Plavix  ID - Doxy per TRH Foley - None, spont void Plan - Appreciate TRH w/u for syncope and assistance w/ hypoxia. Therapy evals.   I reviewed nursing notes, Consultant (NSGY) notes, hospitalist notes, last 24 h vitals and pain scores, last 48 h intake and output, last 24 h labs and trends, and last 24 h imaging results.    LOS: 2 days    Delton Filbert, [facility] Surgery 12/03/2023, 10:58 AM Please see Amion for pager number during day hours 7:00am-4:30pm

## 2023-12-03 NOTE — Evaluation (Signed)
 Occupational Therapy Evaluation Patient Details Name: Derrick Stokes MRN: [id number] DOB: 1950/11/28 Today's Date: 12/03/2023   History of Present Illness   Pt is a 73 y/o male presenting on 5/14 after syncope and fall. Found with L rib fxs 2-8, CT with 11mm intraparenchymal hemorrhage without midline shift. Serial CT stable. PMH: HTN, HLD, GERD, CAD s/p CABG 2012 with subsequent DES to LCx and RCA in 2016, COPD, tobacco abuse, alcohol abuse     Clinical Impressions PTA patient independent.  Admitted for above and presents with problem list below.  Pt requires min contact for bed mobility, contact guard for transfers and stepping to recliner.  Limited tolerance due to pain in L ribs.  Pt requires setup to mod assist for ADLs.  He reports having good support at home.  VSS on 4L [place], on RA desaturated to 86%.  Cognitively, some decreased short term memory, awareness and problem solving but anticipate this maybe baseline. Anticipate he will progress well, but do recommend 24/7 support after dc home.  Will follow acutely with no needs after dc.      If plan is discharge home, recommend the following:   A little help with walking and/or transfers;A little help with bathing/dressing/bathroom;Assistance with cooking/housework;Assist for transportation;Help with stairs or ramp for entrance     Functional Status Assessment   Patient has had a recent decline in their functional status and demonstrates the ability to make significant improvements in function in a reasonable and predictable amount of time.     Equipment Recommendations   None recommended by OT     Recommendations for Other Services         Precautions/Restrictions   Precautions Precautions: Fall Recall of Precautions/Restrictions: Intact Restrictions Weight Bearing Restrictions Per Provider Order: No     Mobility Bed Mobility Overal bed mobility: Needs Assistance Bed Mobility: Rolling, Sidelying to  Sit Rolling: Contact guard assist Sidelying to sit: Contact guard assist, HOB elevated            Transfers Overall transfer level: Needs assistance Equipment used: None Transfers: Sit to/from Stand Sit to Stand: Contact guard assist           General transfer comment: for safety, line mgmt. mild instability and limited by pain      Balance Overall balance assessment: Mild deficits observed, not formally tested                                         ADL either performed or assessed with clinical judgement   ADL Overall ADL's : Needs assistance/impaired     Grooming: Set up;Sitting           Upper Body Dressing : Minimal assistance;Sitting   Lower Body Dressing: Sit to/from stand;Moderate assistance Lower Body Dressing Details (indicate cue type and reason): requires assist for socks, contact guard to stand Toilet Transfer: Contact guard assist;Ambulation Toilet Transfer Details (indicate cue type and reason): to recliner         Functional mobility during ADLs: Contact guard assist       Vision   Vision Assessment?: No apparent visual deficits     Perception         Praxis         Pertinent Vitals/Pain Pain Assessment Pain Assessment: Faces Faces Pain Scale: Hurts whole lot Pain Location: ribs during movement or coughing Pain Descriptors / Indicators: Discomfort,  Grimacing, Guarding Pain Intervention(s): Limited activity within patient's tolerance, Monitored during session, Repositioned     Extremity/Trunk Assessment Upper Extremity Assessment Upper Extremity Assessment: LUE deficits/detail LUE Deficits / Details: limited shoulder flexion to 80* due to pain from ribs. (also with DIP amp of middle finger) LUE Sensation: WNL LUE Coordination: decreased gross motor   Lower Extremity Assessment Lower Extremity Assessment: Defer to PT evaluation   Cervical / Trunk Assessment Cervical / Trunk Assessment: Other  exceptions Cervical / Trunk Exceptions: rib fxs   Communication Communication Communication: Impaired Factors Affecting Communication: Hearing impaired   Cognition Arousal: Alert Behavior During Therapy: WFL for tasks assessed/performed Cognition: Cognition impaired, No family/caregiver present to determine baseline     Awareness: Online awareness impaired Memory impairment (select all impairments): Short-term memory Attention impairment (select first level of impairment): Alternating attention Executive functioning impairment (select all impairments): Problem solving OT - Cognition Comments: pt with some decreased safety awarness and problem sovling, likely baseline.  pt HOH as well.                 Following commands: Intact       Cueing  General Comments   Cueing Techniques: Verbal cues  SpO2 down to as low as 86% on RA sitting at rest, SpO2 >/= 92% on 4L O2 via [place]   Exercises     Shoulder Instructions      Home Living Family/patient expects to be discharged to:: Private residence Living Arrangements: Children Available Help at Discharge: Family;Available PRN/intermittently (most of the time) Type of Home: House Home Access: Stairs to enter [employer] of Steps: 5 Entrance Stairs-Rails: Left Home Layout: One level (x1 small step up from back room to rest of house)     Bathroom Shower/Tub: Chief Strategy Officer: Standard     Home Equipment: Grab bars - tub/shower;Grab bars - toilet;Rolling Walker (2 wheels);Cane - single point;Shower seat;BSC/3in1      Lives With: Son;Other (Comment) (grandaughter and her boyfriend)    Prior Functioning/Environment Prior Level of Function : Independent/Modified Independent             Mobility Comments: No AD; a couple falls in past couple weeks ADLs Comments: does not drive; cooks and cleans, but not often per pt    OT Problem List: Decreased strength;Decreased activity  tolerance;Impaired balance (sitting and/or standing);Pain;Impaired UE functional use;Decreased knowledge of precautions;Decreased knowledge of use of DME or AE   OT Treatment/Interventions: Self-care/ADL training;Therapeutic exercise;DME and/or AE instruction;Therapeutic activities;Patient/family education      OT Goals(Current goals can be found in the care plan section)   Acute Rehab OT Goals Patient Stated Goal: home, less pain OT Goal Formulation: With patient Time For Goal Achievement: 12/17/23 Potential to Achieve Goals: Good   OT Frequency:  Min 2X/week    Co-evaluation              AM-PAC OT "6 Clicks" Daily Activity     Outcome Measure Help from another person eating meals?: None Help from another person taking care of personal grooming?: A Little Help from another person toileting, which includes using toliet, bedpan, or urinal?: A Little Help from another person bathing (including washing, rinsing, drying)?: A Lot Help from another person to put on and taking off regular upper body clothing?: A Little Help from another person to put on and taking off regular lower body clothing?: A Lot 6 Click Score: 17   End of Session Equipment Utilized During Treatment: Gait belt;Rolling walker (  2 wheels) Nurse Communication: Mobility status;Precautions  Activity Tolerance: Patient tolerated treatment well Patient left: in chair;with call bell/phone within reach;with chair alarm set  OT Visit Diagnosis: Other abnormalities of gait and mobility (R26.89);Muscle weakness (generalized) (M62.81);Pain;History of falling (Z91.81) Pain - Right/Left: Left Pain - part of body:  (ribs)                Time: 9629-5284 OT Time Calculation (min): 12 min Charges:  OT General Charges $OT Visit: 1 Visit OT Evaluation $OT Eval Moderate Complexity: 1 Mod  Bary Boss, OT Acute Rehabilitation Services Office [phone] Secure Chat Preferred    Fredrich Jefferson 12/03/2023, 11:35 AM

## 2023-12-04 LAB — BASIC METABOLIC PANEL WITH GFR
Anion gap: 8 (ref 5–15)
BUN: 8 mg/dL (ref 8–23)
CO2: 27 mmol/L (ref 22–32)
Calcium: 9 mg/dL (ref 8.9–10.3)
Chloride: 102 mmol/L (ref 98–111)
Creatinine, Ser: 0.6 mg/dL — ABNORMAL LOW (ref 0.61–1.24)
GFR, Estimated: >60 mL/min (ref >60)
Glucose, Bld: 93 mg/dL (ref 70–99)
Potassium: 3.5 mmol/L (ref 3.5–5.1)
Sodium: 137 mmol/L (ref 135–145)

## 2023-12-04 LAB — CBC
HCT: 37.8 % — ABNORMAL LOW (ref 39.0–52.0)
Hemoglobin: 13.2 g/dL (ref 13.0–17.0)
MCH: 32.4 pg (ref 26.0–34.0)
MCHC: 34.9 g/dL (ref 30.0–36.0)
MCV: 92.9 fL (ref 80.0–100.0)
Platelets: 196 10*3/uL (ref 150–400)
RBC: 4.07 MIL/uL — ABNORMAL LOW (ref 4.22–5.81)
RDW: 13.4 % (ref 11.5–15.5)
WBC: 9.2 10*3/uL (ref 4.0–10.5)
nRBC: 0 % (ref 0.0–0.2)

## 2023-12-04 MED ORDER — POLYETHYLENE GLYCOL 3350 17 G PO PACK
17.0000 g | PACK | Freq: Every day | ORAL | Status: DC
  Administered 2023-12-04 – 2023-12-06 (×3): 17 g via ORAL
  Filled 2023-12-04 (×3): qty 1

## 2023-12-04 MED ORDER — PREDNISONE 20 MG PO TABS
40.0000 mg | ORAL_TABLET | Freq: Every day | ORAL | Status: DC
  Administered 2023-12-05 – 2023-12-06 (×2): 40 mg via ORAL
  Filled 2023-12-04 (×2): qty 2

## 2023-12-04 NOTE — Progress Notes (Signed)
 [facility] Consultation Progress Note  Patient: Derrick Stokes ZOX:[id number]   PCP: [place], [name], MD DOB: 1951/01/18   DOA: 12/01/2023   DOS: 12/04/2023   Date of Service: the patient was seen and examined on 12/04/2023 Primary service: Md, Trauma, MD   Brief Hospital Course: PMH of HTN, HLD, CAD SP CABG, COPD, active smoker, alcohol use-6-8 beers a day, GERD. Presented to the hospital after passing out on 5/13.  Was sitting in the chair stood up and passed out and fell. Reports that 2 weeks ago had another fall when he was working out in the yard and without a warning passed out and fell. Drinks 6-8 beers on a daily basis.  Reports he was not drinking the day he felt this time. Smokes a pack a day. Assessment and Plan: Syncope and collapse. Most likely combination of hypoxia and orthostasis. Labs on admission though do not support dehydration. Blood pressure on admission as well as heart rate was normal. EKG does not show any acute concerning findings. Telemetry so far negative as well. No focal deficit at the time of my evaluation. Echocardiogram shows 55 to 60% EF, no significant valvular abnormality. CT head with intraparenchymal hemorrhage but no other acute abnormalities. No new change in the medications reported. Currently on 4 LPM oxygen. Sats drops to 80 to 85% on room air. Negative orthostatic vitals. Suspect etiology of his syncope could also be related to undiagnosed hypoxia in the setting of COPD or alcohol abuse. For completion of the workup given recurrent episodes may benefit from outpatient Zio patch placement as well. PT OT consult recommended home health. No further workup in the hospital.   COPD exacerbation.  Improving. Appears to have mild exacerbation of his COPD. Currently on DuoNebs, antibiotic and steroid. Recommend to continue inhalers, steroid taper and antibiotic on discharge. I have ordered oxygen therapy for home as well. Currently no  wheezing.  Hypertension. Blood pressure stable. Heart rate in 60s on metoprolol  25 mg. Home dose of metoprolol  is 50 mg which could have contributed to the syncope. On discharge we will continue metoprolol  25 mg only.  HLD. On statin and Zetia .  Resume on discharge.   CAD. History of CABG in 2012. History of PCI 2016. HLD Negative stress test in 2024. On aspirin  and Plavix .  Resumed on 5/15.  Monitor. Echocardiogram normal.  Less likely cardiac event as a cause of his syncope.   Active smoker. Recommended to quit smoking. Nicotine patch and nicotine gum recommended. Patient willing to quit.   Alcohol abuse. Drinks 6-8 beers on a daily basis. Tells me that he drinks because he lost his family recently. On ciwa protocol. Recommended to reduce his drinking.  Traumatic brain injury with intraparenchymal hemorrhage with surrounding vasogenic edema. Currently admitted under trauma care service. Neurosurgery was consulted. Serial CT scans are negative. Patient started back on Plavix  and aspirin .   Anxiety and depression On gabapentin , Cymbalta . Follow-up with PCP outpatient.   We will continue to follow the patient.   Likely can be discharged tomorrow. Oxygen have been ordered.  Subjective: No nausea no vomiting.  Pain still present.  Ability to cough is improving.  Objective: Vitals:   12/03/23 2333 12/04/23 0347 12/04/23 0800 12/04/23 1135  BP: 110/72 109/68 122/70 125/79  Pulse: 71 65 73 64  Resp: 20 17 (!) 24 (!) 24  Temp: (!) 97.5 F (36.4 C) 97.8 F (36.6 C) 97.7 F (36.5 C) (!) 97.5 F (36.4 C)  TempSrc: Oral Oral  Oral Oral  SpO2: 93% 93% 94% 93%  Weight:      Height:        Basal crackles. No wheezing. S1-S2 present. No edema.  Family Communication: Brother at bedside.  Data Reviewed: Since last encounter, pertinent lab results CBC   I have discussed pt's care plan and test results with trauma surgery.   Author: Charlean Congress, MD  [facility] 12/04/2023  2:22 PM  To reach On-call, Look up on care teams to locate the [facility] team or provider name and reach out to them via secure chat or [email or web address] Between 7PM-7AM, please contact night-coverage. If you still have difficulty reaching the attending provider, please page the [facility] (Director on Call) for [facility] on amion for assistance.

## 2023-12-04 NOTE — Progress Notes (Addendum)
 Referral received to assist with home O2. Met with pt and brother. Pt reports he doesn't have a preference for a DME agency. Will contact [employer] and he agreed with agency. Contacted Jermaine at [employer] and he accepted the referral. Pt reports his son will provide transportation at time of DC.

## 2023-12-04 NOTE — Progress Notes (Signed)
 Subjective: No new complaints today.  Pulled 1000 on IS, on 2L [place].  No BM  Objective: Vital signs in last 24 hours: Temp:  [97.5 F (36.4 C)-98 F (36.7 C)] 97.7 F (36.5 C) (05/17 0800) Pulse Rate:  [63-73] 73 (05/17 0800) Resp:  [17-24] 24 (05/17 0800) BP: (104-122)/(68-72) 122/70 (05/17 0800) SpO2:  [92 %-95 %] 94 % (05/17 0800) Last BM Date :  (PTA)  Intake/Output from previous day: 05/16 0701 - 05/17 0700 In: 580 [P.O.:580] Out: -  Intake/Output this shift: Total I/O In: 580 [P.O.:580] Out: -   PE: Gen:  Alert, NAD, pleasant HEENT: EOM's intact, pupils equal and round Card:  Reg rate Pulm:  B/l rhonci. No rales. Pulling 1000 on IS. On 2L.  Abd: Soft, ND, NT Ext:  No LE edema Neuro: CN 3-12 grossly intact, MAE's, f/c, non-focal  Lab Results:  Recent Labs    12/03/23 0552 12/04/23 0714  WBC 8.3 9.2  HGB 13.0 13.2  HCT 37.2* 37.8*  PLT 179 196   BMET Recent Labs    12/03/23 0552 12/04/23 0526  NA 139 137  K 3.6 3.5  CL 104 102  CO2 27 27  GLUCOSE 89 93  BUN 5* 8  CREATININE 0.67 0.60*  CALCIUM  8.8* 9.0   PT/INR No results for input(s): "LABPROT", "INR" in the last 72 hours. CMP     Component Value Date/Time   NA 137 12/04/2023 0526   NA 136 10/07/2020 0930   K 3.5 12/04/2023 0526   CL 102 12/04/2023 0526   CO2 27 12/04/2023 0526   GLUCOSE 93 12/04/2023 0526   BUN 8 12/04/2023 0526   BUN 12 10/07/2020 0930   CREATININE 0.60 (L) 12/04/2023 0526   CREATININE 0.84 02/05/2020 1417   CALCIUM  9.0 12/04/2023 0526   PROT 8.2 (H) 12/01/2023 1616   PROT 7.0 03/08/2019 0905   ALBUMIN 4.3 12/01/2023 1616   ALBUMIN 4.3 03/08/2019 0905   AST 33 12/01/2023 1616   ALT 30 12/01/2023 1616   ALKPHOS 113 12/01/2023 1616   BILITOT 1.8 (H) 12/01/2023 1616   BILITOT 0.9 03/08/2019 0905   GFRNONAA >60 12/04/2023 0526   GFRAA 109 03/08/2019 0905   Lipase     Component Value Date/Time   LIPASE 24 09/18/2023 0320    Studies/Results: VAS US   CAROTID Result Date: 12/04/2023 Carotid Arterial Duplex Study Patient Name:  Derrick Stokes  Date of Exam:   12/03/2023 Medical Rec #: [id number]         Accession #:    [id number] Date of Birth: 27-May-1951          Patient Gender: M Patient Age:   28 years Exam Location:  [facility] Procedure:      VAS US  CAROTID Referring Phys: Diego Foy --------------------------------------------------------------------------------  Indications:       Syncope. Risk Factors:      Hypertension, hyperlipidemia, current smoker, coronary artery                    disease. Other Factors:     ETOH abuse. Comparison Study:  No prior study Performing Technologist: Carleene Chase RVS  Examination Guidelines: A complete evaluation includes B-mode imaging, spectral Doppler, color Doppler, and power Doppler as needed of all accessible portions of each vessel. Bilateral testing is considered an integral part of a complete examination. Limited examinations for reoccurring indications may be performed as noted.  Right Carotid Findings: +----------+--------+--------+--------+------------------+------------------+  PSV cm/sEDV cm/sStenosisPlaque DescriptionComments           +----------+--------+--------+--------+------------------+------------------+ CCA Prox  66      19                                intimal thickening +----------+--------+--------+--------+------------------+------------------+ CCA Distal54      18              heterogenous                         +----------+--------+--------+--------+------------------+------------------+ ICA Prox  41      14              heterogenous                         +----------+--------+--------+--------+------------------+------------------+ ICA Mid   71      29                                                   +----------+--------+--------+--------+------------------+------------------+ ICA Distal45      17                                 tortuous           +----------+--------+--------+--------+------------------+------------------+ ECA       65      16                                                   +----------+--------+--------+--------+------------------+------------------+ +----------+--------+-------+--------+-------------------+           PSV cm/sEDV cmsDescribeArm Pressure (mmHG) +----------+--------+-------+--------+-------------------+ [id number]                                         +----------+--------+-------+--------+-------------------+ +---------+--------+--+--------+--+ VertebralPSV cm/s43EDV cm/s14 +---------+--------+--+--------+--+  Left Carotid Findings: +----------+--------+--------+--------+------------------+------------------+           PSV cm/sEDV cm/sStenosisPlaque DescriptionComments           +----------+--------+--------+--------+------------------+------------------+ CCA Prox  67      19                                intimal thickening +----------+--------+--------+--------+------------------+------------------+ CCA Distal77      26                                intimal thickening +----------+--------+--------+--------+------------------+------------------+ ICA Prox  76      26                                                   +----------+--------+--------+--------+------------------+------------------+ ICA Mid   71      27                                                   +----------+--------+--------+--------+------------------+------------------+  ICA Distal61      24                                                   +----------+--------+--------+--------+------------------+------------------+ ECA       53      14                                                   +----------+--------+--------+--------+------------------+------------------+ +----------+--------+--------+--------+-------------------+           PSV cm/sEDV  cm/sDescribeArm Pressure (mmHG) +----------+--------+--------+--------+-------------------+ [id number]                                          +----------+--------+--------+--------+-------------------+ +---------+--------+--+--------+--+ VertebralPSV cm/s49EDV cm/s15 +---------+--------+--+--------+--+   Summary: Right Carotid: The extracranial vessels were near-normal with only minimal wall                thickening or plaque. Left Carotid: The extracranial vessels were near-normal with only minimal wall               thickening or plaque. Vertebrals:  Bilateral vertebral arteries demonstrate antegrade flow. Subclavians: Normal flow hemodynamics were seen in bilateral subclavian              arteries. *See table(s) above for measurements and observations.  Electronically signed by Ardella Beaver MD on 12/04/2023 at 10:06:51 AM.    Final     Anti-infectives: Anti-infectives (From admission, onward)    Start     Dose/Rate Route Frequency Ordered Stop   12/03/23 1045  doxycycline  (VIBRA -TABS) tablet 100 mg        100 mg Oral Every 12 hours 12/03/23 0950          Assessment/Plan Syncope with GLF   TBI/40mm intraparenchymal hemorrhage - per Dr. Larrie Po, seriel CT H stable, OK to resume ASA and Plavix . Cont Keppra . Plan TBI team therapies L rib fxs 2-8 - multimodal pain control, IS 10x/hr while awake, PRN O2, F/U CXR no PTX EtOH use - CIWA, CAGE-AID Syncope - Appreciate TRH evaluation and workup Hypertension - TRH reduced Metoprolol   History of CAD status post CABG, DES - back on ASA/Plavix   Aortic atherosclerosis Tobacco abuse GERD Multilevel remote thoracic compression fractures  Hypoxia - TRH tx for COPD exacerbation w/ Doxy and steroids. Also giving lasix  for fluid overload on CXR. Wean o2 as able. Cont pulm toilet.   FEN - HH diet VTE - SCDs, ASA/Plavix  ID - Doxy per TRH Foley - None, spont void Plan - Appreciate TRH w/u for syncope and assistance w/ hypoxia. Therapy  evals. Likely home with family support when ok from respiratory standpoint  I reviewed nursing notes, Consultant (NSGY) notes, hospitalist notes, last 24 h vitals and pain scores, last 48 h intake and output, last 24 h labs and trends, and last 24 h imaging results.    LOS: 3 days    Leone Ralphs, [facility] Surgery 12/04/2023, 10:25 AM Please see Amion for pager number during day hours 7:00am-4:30pm

## 2023-12-05 ENCOUNTER — Inpatient Hospital Stay (HOSPITAL_COMMUNITY)

## 2023-12-05 LAB — BASIC METABOLIC PANEL WITH GFR
Anion gap: 10 (ref 5–15)
BUN: 13 mg/dL (ref 8–23)
CO2: 28 mmol/L (ref 22–32)
Calcium: 9.4 mg/dL (ref 8.9–10.3)
Chloride: 100 mmol/L (ref 98–111)
Creatinine, Ser: 0.67 mg/dL (ref 0.61–1.24)
GFR, Estimated: >60 mL/min (ref >60)
Glucose, Bld: 112 mg/dL — ABNORMAL HIGH (ref 70–99)
Potassium: 3.4 mmol/L — ABNORMAL LOW (ref 3.5–5.1)
Sodium: 138 mmol/L (ref 135–145)

## 2023-12-05 LAB — CBC
HCT: 40.5 % (ref 39.0–52.0)
Hemoglobin: 13.8 g/dL (ref 13.0–17.0)
MCH: 31.6 pg (ref 26.0–34.0)
MCHC: 34.1 g/dL (ref 30.0–36.0)
MCV: 92.7 fL (ref 80.0–100.0)
Platelets: 213 10*3/uL (ref 150–400)
RBC: 4.37 MIL/uL (ref 4.22–5.81)
RDW: 13.8 % (ref 11.5–15.5)
WBC: 11.9 10*3/uL — ABNORMAL HIGH (ref 4.0–10.5)
nRBC: 0 % (ref 0.0–0.2)

## 2023-12-05 LAB — TROPONIN I (HIGH SENSITIVITY)
Troponin I (High Sensitivity): 5 ng/L (ref <18)
Troponin I (High Sensitivity): 4 ng/L (ref <18)

## 2023-12-05 MED ORDER — REVEFENACIN 175 MCG/3ML IN SOLN
175.0000 ug | Freq: Every day | RESPIRATORY_TRACT | Status: DC
  Administered 2023-12-05 – 2023-12-06 (×2): 175 ug via RESPIRATORY_TRACT
  Filled 2023-12-05 (×2): qty 3

## 2023-12-05 MED ORDER — GUAIFENESIN ER 600 MG PO TB12
1200.0000 mg | ORAL_TABLET | Freq: Two times a day (BID) | ORAL | Status: DC
  Administered 2023-12-05 – 2023-12-06 (×3): 1200 mg via ORAL
  Filled 2023-12-05 (×3): qty 2

## 2023-12-05 MED ORDER — LEVETIRACETAM 500 MG PO TABS
500.0000 mg | ORAL_TABLET | Freq: Two times a day (BID) | ORAL | Status: DC
  Administered 2023-12-05 – 2023-12-06 (×2): 500 mg via ORAL
  Filled 2023-12-05 (×2): qty 1

## 2023-12-05 MED ORDER — POTASSIUM CHLORIDE CRYS ER 20 MEQ PO TBCR
40.0000 meq | EXTENDED_RELEASE_TABLET | Freq: Once | ORAL | Status: AC
  Administered 2023-12-05: 40 meq via ORAL
  Filled 2023-12-05: qty 2

## 2023-12-05 MED ORDER — BUDESONIDE 0.25 MG/2ML IN SUSP
0.2500 mg | Freq: Two times a day (BID) | RESPIRATORY_TRACT | Status: DC
  Administered 2023-12-05 – 2023-12-06 (×3): 0.25 mg via RESPIRATORY_TRACT
  Filled 2023-12-05 (×3): qty 2

## 2023-12-05 MED ORDER — DEXTROMETHORPHAN POLISTIREX ER 30 MG/5ML PO SUER
30.0000 mg | Freq: Two times a day (BID) | ORAL | Status: DC
Start: 2023-12-05 — End: 2023-12-06
  Administered 2023-12-05 – 2023-12-06 (×3): 30 mg via ORAL
  Filled 2023-12-05 (×4): qty 5

## 2023-12-05 MED ORDER — ARFORMOTEROL TARTRATE 15 MCG/2ML IN NEBU
15.0000 ug | INHALATION_SOLUTION | Freq: Two times a day (BID) | RESPIRATORY_TRACT | Status: DC
  Administered 2023-12-05 – 2023-12-06 (×3): 15 ug via RESPIRATORY_TRACT
  Filled 2023-12-05 (×3): qty 2

## 2023-12-05 NOTE — Progress Notes (Signed)
 Subjective: Having a lot of chest pain today, feels like pressure, left arm numbness, some nausea.  Medicine has seen this morning.  EKG with NSR and no concerning changes.  Trop ordered and pending.  CXR which appears stable upon my review.  CT head pending.  Denies SOB.  Objective: Vital signs in last 24 hours: Temp:  [97.5 F (36.4 C)-98 F (36.7 C)] 98 F (36.7 C) (05/18 0804) Pulse Rate:  [64-85] 68 (05/18 0835) Resp:  [20-30] 24 (05/18 0835) BP: (97-132)/(72-79) 127/77 (05/18 0804) SpO2:  [93 %-98 %] 98 % (05/18 1015) Last BM Date : 11/30/23  Intake/Output from previous day: 05/17 0701 - 05/18 0700 In: 610 [P.O.:580; IV Piggyback:30] Out: -  Intake/Output this shift: No intake/output data recorded.  PE: Gen:  Alert, NAD, pleasant HEENT: EOM's intact, pupils equal and round Card:  Reg rate, NSR Pulm:  B/l rhonci. No rales. Pulling 1000 on IS. On 2L.  Abd: Soft, ND, NT Ext:  No LE edema, normal sensation upon my eval of LUE and in hand.  Neuro: CN 3-12 grossly intact, MAE's, f/c, non-focal  Lab Results:  Recent Labs    12/03/23 0552 12/04/23 0714  WBC 8.3 9.2  HGB 13.0 13.2  HCT 37.2* 37.8*  PLT 179 196   BMET Recent Labs    12/03/23 0552 12/04/23 0526  NA 139 137  K 3.6 3.5  CL 104 102  CO2 27 27  GLUCOSE 89 93  BUN 5* 8  CREATININE 0.67 0.60*  CALCIUM  8.8* 9.0   PT/INR No results for input(s): "LABPROT", "INR" in the last 72 hours. CMP     Component Value Date/Time   NA 137 12/04/2023 0526   NA 136 10/07/2020 0930   K 3.5 12/04/2023 0526   CL 102 12/04/2023 0526   CO2 27 12/04/2023 0526   GLUCOSE 93 12/04/2023 0526   BUN 8 12/04/2023 0526   BUN 12 10/07/2020 0930   CREATININE 0.60 (L) 12/04/2023 0526   CREATININE 0.84 02/05/2020 1417   CALCIUM  9.0 12/04/2023 0526   PROT 8.2 (H) 12/01/2023 1616   PROT 7.0 03/08/2019 0905   ALBUMIN 4.3 12/01/2023 1616   ALBUMIN 4.3 03/08/2019 0905   AST 33 12/01/2023 1616   ALT 30 12/01/2023  1616   ALKPHOS 113 12/01/2023 1616   BILITOT 1.8 (H) 12/01/2023 1616   BILITOT 0.9 03/08/2019 0905   GFRNONAA >60 12/04/2023 0526   GFRAA 109 03/08/2019 0905   Lipase     Component Value Date/Time   LIPASE 24 09/18/2023 0320    Studies/Results: VAS US  CAROTID Result Date: 12/04/2023 Carotid Arterial Duplex Study Patient Name:  Derrick Stokes  Date of Exam:   12/03/2023 Medical Rec #: [id number]         Accession #:    [id number] Date of Birth: 1950-11-12          Patient Gender: M Patient Age:   73 years Exam Location:  [facility] Procedure:      VAS US  CAROTID Referring Phys: Diego Foy --------------------------------------------------------------------------------  Indications:       Syncope. Risk Factors:      Hypertension, hyperlipidemia, current smoker, coronary artery                    disease. Other Factors:     ETOH abuse. Comparison Study:  No prior study Performing Technologist: Carleene Chase RVS  Examination Guidelines: A complete evaluation includes B-mode imaging, spectral  Doppler, color Doppler, and power Doppler as needed of all accessible portions of each vessel. Bilateral testing is considered an integral part of a complete examination. Limited examinations for reoccurring indications may be performed as noted.  Right Carotid Findings: +----------+--------+--------+--------+------------------+------------------+           PSV cm/sEDV cm/sStenosisPlaque DescriptionComments           +----------+--------+--------+--------+------------------+------------------+ CCA Prox  66      19                                intimal thickening +----------+--------+--------+--------+------------------+------------------+ CCA Distal54      18              heterogenous                         +----------+--------+--------+--------+------------------+------------------+ ICA Prox  41      14              heterogenous                          +----------+--------+--------+--------+------------------+------------------+ ICA Mid   71      29                                                   +----------+--------+--------+--------+------------------+------------------+ ICA Distal45      17                                tortuous           +----------+--------+--------+--------+------------------+------------------+ ECA       65      16                                                   +----------+--------+--------+--------+------------------+------------------+ +----------+--------+-------+--------+-------------------+           PSV cm/sEDV cmsDescribeArm Pressure (mmHG) +----------+--------+-------+--------+-------------------+ [id number]                                         +----------+--------+-------+--------+-------------------+ +---------+--------+--+--------+--+ VertebralPSV cm/s43EDV cm/s14 +---------+--------+--+--------+--+  Left Carotid Findings: +----------+--------+--------+--------+------------------+------------------+           PSV cm/sEDV cm/sStenosisPlaque DescriptionComments           +----------+--------+--------+--------+------------------+------------------+ CCA Prox  67      19                                intimal thickening +----------+--------+--------+--------+------------------+------------------+ CCA Distal77      26                                intimal thickening +----------+--------+--------+--------+------------------+------------------+ ICA Prox  76      26                                                   +----------+--------+--------+--------+------------------+------------------+  ICA Mid   71      27                                                   +----------+--------+--------+--------+------------------+------------------+ ICA Distal61      24                                                    +----------+--------+--------+--------+------------------+------------------+ ECA       53      14                                                   +----------+--------+--------+--------+------------------+------------------+ +----------+--------+--------+--------+-------------------+           PSV cm/sEDV cm/sDescribeArm Pressure (mmHG) +----------+--------+--------+--------+-------------------+ [id number]                                          +----------+--------+--------+--------+-------------------+ +---------+--------+--+--------+--+ VertebralPSV cm/s49EDV cm/s15 +---------+--------+--+--------+--+   Summary: Right Carotid: The extracranial vessels were near-normal with only minimal wall                thickening or plaque. Left Carotid: The extracranial vessels were near-normal with only minimal wall               thickening or plaque. Vertebrals:  Bilateral vertebral arteries demonstrate antegrade flow. Subclavians: Normal flow hemodynamics were seen in bilateral subclavian              arteries. *See table(s) above for measurements and observations.  Electronically signed by Ardella Beaver MD on 12/04/2023 at 10:06:51 AM.    Final     Anti-infectives: Anti-infectives (From admission, onward)    Start     Dose/Rate Route Frequency Ordered Stop   12/03/23 1045  doxycycline  (VIBRA -TABS) tablet 100 mg        100 mg Oral Every 12 hours 12/03/23 0950          Assessment/Plan Syncope with GLF   TBI/11mm intraparenchymal hemorrhage - per Dr. Larrie Po, seriel CT H stable, OK to resume ASA and Plavix . Cont Keppra . Plan TBI team therapies L rib fxs 2-8 - multimodal pain control, IS 10x/hr while awake, PRN O2, F/U CXR no PTX EtOH use - CIWA, CAGE-AID Syncope - Appreciate TRH evaluation and workup Hypertension - TRH reduced Metoprolol   History of CAD status post CABG, DES/chest pain - back on ASA/Plavix .  Work up per medicine.  EKG normal, CXR appears stable, awaiting  trops and CT head due to LUE numbness.  Patient states these symptoms were like last time when he required cardiac intervention.  This pain is different than his rib fx pain. Aortic atherosclerosis Tobacco abuse GERD Multilevel remote thoracic compression fractures  Hypoxia - TRH tx for COPD exacerbation w/ Doxy and steroids. Also giving lasix  for fluid overload on CXR. Wean o2 as able. Cont pulm toilet.   FEN - HH diet VTE - SCDs, ASA/Plavix  ID - Doxy per TRH Foley - None, spont  void Plan - Appreciate TRH w/u for syncope and assistance w/ hypoxia. Therapy evals. Cardiac/chest pain work up.  Medicine has agreed to take patient if a medical problem identified.  Greatly appreciate their assistance  I reviewed nursing notes, Consultant   notes, hospitalist notes, last 24 h vitals and pain scores, last 48 h intake and output, last 24 h labs and trends, and last 24 h imaging results.    LOS: 4 days    Leone Ralphs, [facility] 12/05/2023, 10:22 AM Please see Amion for pager number during day hours 7:00am-4:30pm

## 2023-12-05 NOTE — Progress Notes (Signed)
 [facility] Progress Note Patient: Derrick Stokes UEA:[id number] DOB: 1951-06-25 DOA: 12/01/2023  DOS: the patient was seen and examined on 12/05/2023  Brief Hospital Course: PMH of HTN, HLD, CAD SP CABG, COPD, active smoker, alcohol use-6-8 beers a day, GERD. Presented to the hospital after passing out on 5/13.  Was sitting in the chair stood up and passed out and fell. Reports that 2 weeks ago had another fall when he was working out in the yard and without a warning passed out and fell. Drinks 6-8 beers on a daily basis.  Reports he was not drinking the day he felt this time. Smokes a pack a day. Assessment and Plan: Syncope and collapse. Most likely combination of hypoxia and orthostasis. Labs on admission though do not support dehydration. Blood pressure on admission as well as heart rate was normal. EKG does not show any acute concerning findings. Telemetry so far negative as well. No focal deficit at the time of my evaluation. Echocardiogram shows 55 to 60% EF, no significant valvular abnormality. CT head with intraparenchymal hemorrhage but no other acute abnormalities. No new change in the medications reported. Currently on 4 LPM oxygen. Sats drops to 80 to 85% on room air. Negative orthostatic vitals. Suspect etiology of his syncope could also be related to undiagnosed hypoxia in the setting of COPD or alcohol abuse. For completion of the workup given recurrent episodes may benefit from outpatient Zio patch placement as well. PT OT consult recommended home health. No further workup in the hospital.   COPD exacerbation.  Improving. Appears to have mild exacerbation of his COPD. Currently on DuoNebs, antibiotic and steroid. Recommend to continue inhalers, steroid taper and antibiotic on discharge. I have ordered oxygen therapy for home as well. Have more wheezing.  Continue nebulizer therapy.  Will order nebulizer for home as well.  Hypertension. Blood pressure  stable. Heart rate in 60s on metoprolol  25 mg. Home dose of metoprolol  is 50 mg which could have contributed to the syncope. On discharge we will continue metoprolol  25 mg only.  HLD. On statin and Zetia .  Resume on discharge.   CAD. History of CABG in 2012. History of PCI 2016. HLD Negative stress test in 2024. On aspirin  and Plavix .  Resumed on 5/15.  Monitor. Echocardiogram normal.  Less likely cardiac event as a cause of his syncope.   Active smoker. Recommended to quit smoking. Nicotine patch and nicotine gum recommended. Patient willing to quit.   Alcohol abuse. Drinks 6-8 beers on a daily basis. Tells me that he drinks because he lost his family recently. On ciwa protocol. Recommended to reduce his drinking.  Traumatic brain injury with intraparenchymal hemorrhage with surrounding vasogenic edema. Currently admitted under trauma care service. Neurosurgery was consulted. Serial CT scans are negative. Patient started back on Plavix  and aspirin .   Anxiety and depression On gabapentin , Cymbalta . Follow-up with PCP outpatient.  Chest pain. Reported some chest pain on 5/18 which felt like it chest tightness to RN. To me he reported that the chest pain was more related to his rib fracture. Troponins are negative.  EKG unremarkable.  Chest x-ray negative for any new findings. No further workup.  Left arm numbness. CT of the head was performed which was negative for any acute abnormality. No focal deficit exam of my evaluation. Symptoms are improving. Monitor.   Subjective: No nausea no vomiting no fever no chills.  Reports some chest pain earlier.  Reports of the left palm numbness.  Physical Exam: General:  in Mild distress, No Rash Cardiovascular: S1 and S2 Present, No Murmur Respiratory: Good respiratory effort, Bilateral Air entry present. Upper airway  Crackles, bilateral expiratory  wheezes Abdomen: Bowel Sound present, No tenderness Extremities: No  edema Neuro: Alert and oriented x3, no new focal deficit  Data Reviewed: I have Reviewed nursing notes, Vitals, and Lab results. Since last encounter, pertinent lab results CBC and BMP and troponin   . I have ordered test including BMP  . I have discussed pt's care plan and test results with trauma surgery  . I have ordered imaging CT head  .  Reviewed EKG  Disposition: Status is: Inpatient Remains inpatient appropriate because: Monitor for improvement in respiratory status have more wheezing today.  Changing nebulizer therapy.  Will order nebulizer for home as well.  SCDs Start: 12/01/23 1848   Family Communication: Family at bedside Level of care: Progressive   Vitals:   12/05/23 1012 12/05/23 1015 12/05/23 1220 12/05/23 1620  BP:   101/77 113/79  Pulse:   77 64  Resp:   20 19  Temp:   97.7 F (36.5 C) 97.7 F (36.5 C)  TempSrc:   Oral Oral  SpO2: 98% 98% 98% 99%  Weight:      Height:         Author: Charlean Congress, MD 12/05/2023 6:53 PM  Please look on [email or web address] to find out who is on call.

## 2023-12-05 NOTE — Progress Notes (Signed)
 Pt c/o back pain earlier which he was given prn pain medication. As RN was in the room pt report some LUE numbness with chest pain which he endorsed heaviness, pressure and discomfort. Pt remained on 2L oxygen, VSS, Dr. Lydia Sams was notified and new orders received. EKG completed and placed in chart, Pt stated " The last time I experienced something like this I ended up getting a bypass". Pt report numbness to LUE is resolved and feeling better. Trauma PA Cherlyn Cornet notified and aware. Pt resting in bed with call light within reach. Lorrene Rosser RN   12/05/23 0804  Vitals  Temp 98 F (36.7 C)  Temp Source Oral  BP 127/77  MAP (mmHg) 93  BP Location Right Arm  BP Method Automatic  Patient Position (if appropriate) Lying  Pulse Rate 66  Pulse Rate Source Monitor  ECG Heart Rate 66  Resp 20  MEWS COLOR  MEWS Score Color Green  Oxygen Therapy  SpO2 98 %  O2 Device Nasal Cannula  O2 Flow Rate (L/min) 2 L/min  MEWS Score  MEWS Temp 0  MEWS Systolic 0  MEWS Pulse 0  MEWS RR 0  MEWS LOC 0  MEWS Score 0

## 2023-12-06 MED ORDER — ACETAMINOPHEN 500 MG PO TABS: 1000.0000 mg | ORAL_TABLET | Freq: Four times a day (QID) | ORAL | 0 refills | Status: AC

## 2023-12-06 MED ORDER — LIDOCAINE 5 % EX PTCH: 2.0000 | MEDICATED_PATCH | CUTANEOUS | 0 refills | Status: AC

## 2023-12-06 MED ORDER — POLYETHYLENE GLYCOL 3350 17 G PO PACK: 17.0000 g | PACK | Freq: Every day | ORAL | 0 refills | Status: AC

## 2023-12-06 MED ORDER — LEVETIRACETAM 500 MG PO TABS: 500.0000 mg | ORAL_TABLET | Freq: Two times a day (BID) | ORAL | 0 refills | Status: AC

## 2023-12-06 MED ORDER — DOCUSATE SODIUM 100 MG PO CAPS: 100.0000 mg | ORAL_CAPSULE | Freq: Two times a day (BID) | ORAL | 0 refills | Status: AC

## 2023-12-06 MED ORDER — PREDNISONE 20 MG PO TABS: ORAL_TABLET | ORAL | 0 refills | Status: DC

## 2023-12-06 MED ORDER — GUAIFENESIN ER 600 MG PO TB12: 1200.0000 mg | ORAL_TABLET | Freq: Two times a day (BID) | ORAL | 0 refills | Status: AC

## 2023-12-06 MED ORDER — METOPROLOL SUCCINATE ER 25 MG PO TB24: 25.0000 mg | ORAL_TABLET | Freq: Every day | ORAL | 0 refills | Status: AC

## 2023-12-06 MED ORDER — FLUTICASONE-SALMETEROL 250-50 MCG/ACT IN AEPB: 1.0000 | INHALATION_SPRAY | Freq: Two times a day (BID) | RESPIRATORY_TRACT | 0 refills | Status: DC

## 2023-12-06 MED ORDER — ADULT MULTIVITAMIN W/MINERALS CH: 1.0000 | ORAL_TABLET | Freq: Every day | ORAL | 0 refills | Status: AC

## 2023-12-06 MED ORDER — VITAMIN B-1 100 MG PO TABS: 100.0000 mg | ORAL_TABLET | Freq: Every day | ORAL | 0 refills | Status: AC

## 2023-12-06 MED ORDER — OXYCODONE HCL 5 MG PO TABS: 5.0000 mg | ORAL_TABLET | Freq: Four times a day (QID) | ORAL | 0 refills | Status: DC | PRN

## 2023-12-06 MED ORDER — FOLIC ACID 1 MG PO TABS: 1.0000 mg | ORAL_TABLET | Freq: Every day | ORAL | 0 refills | Status: AC

## 2023-12-06 MED ORDER — DEXTROMETHORPHAN POLISTIREX ER 30 MG/5ML PO SUER
30.0000 mg | Freq: Two times a day (BID) | ORAL | 0 refills | Status: AC
Start: 2023-12-06 — End: ?

## 2023-12-06 MED ORDER — BENZONATATE 100 MG PO CAPS: 100.0000 mg | ORAL_CAPSULE | Freq: Three times a day (TID) | ORAL | 0 refills | Status: AC

## 2023-12-06 MED ORDER — GABAPENTIN 300 MG PO CAPS: ORAL_CAPSULE | ORAL | 0 refills | Status: AC

## 2023-12-06 MED ORDER — IPRATROPIUM-ALBUTEROL 0.5-2.5 (3) MG/3ML IN SOLN: RESPIRATORY_TRACT | 0 refills | Status: AC

## 2023-12-06 MED ORDER — UMECLIDINIUM BROMIDE 62.5 MCG/ACT IN AEPB: 1.0000 | INHALATION_SPRAY | Freq: Every day | RESPIRATORY_TRACT | 0 refills | Status: AC

## 2023-12-06 MED ORDER — DOXYCYCLINE HYCLATE 100 MG PO TABS: 100.0000 mg | ORAL_TABLET | Freq: Two times a day (BID) | ORAL | 0 refills | Status: AC

## 2023-12-06 NOTE — Plan of Care (Signed)
 ?  Problem: Clinical Measurements: ?Goal: Will remain free from infection ?Outcome: Progressing ?  ?

## 2023-12-06 NOTE — Progress Notes (Signed)
 Physical Therapy Treatment Patient Details Name: Derrick Stokes MRN: [id number] DOB: [birth year] Today's Date: 12/06/2023   History of Present Illness Pt is a 73 y/o male presenting on 5/14 after syncope and fall. Found with L rib fxs 2-8, CT with 11mm intraparenchymal hemorrhage without midline shift. Serial CT stable. PMH: HTN, HLD, GERD, CAD s/p CABG 2012 with subsequent DES to LCx and RCA in 2016, COPD, tobacco abuse, alcohol abuse    PT Comments  Pt received in supine; reports 7/10 pain in ribs and RN notified for pain medication. Pt performing transfers without physical assist and ambulating to and from bathroom with CGA. Able to perform grooming tasks standing at sink. Deferred further ambulation due to several coughing episodes; pillow splinting encouraged, however, pt not functionally using. Will continue to follow acutely to promote mobility as tolerated.  SATURATION QUALIFICATIONS: (This note is used to comply with regulatory documentation for home oxygen)  Patient Saturations on Room Air at Rest = 93%  Patient Saturations on Room Air while Ambulating = 93%  Patient Saturations on 0 Liters of oxygen while Ambulating = N/A  Please briefly explain why patient needs home oxygen: Pt does not require home oxygen   If plan is discharge home, recommend the following: A little help with bathing/dressing/bathroom;Assistance with cooking/housework;Assist for transportation;Help with stairs or ramp for entrance;A little help with walking and/or transfers   Can travel by private vehicle        Equipment Recommendations  None recommended by PT    Recommendations for Other Services       Precautions / Restrictions Precautions Precautions: Fall Recall of Precautions/Restrictions: Intact Restrictions Weight Bearing Restrictions Per Provider Order: No     Mobility  Bed Mobility Overal bed mobility: Modified Independent Bed Mobility: Rolling, Sidelying to Sit                 Transfers Overall transfer level: Needs assistance Equipment used: None Transfers: Sit to/from Stand, Bed to chair/wheelchair/BSC Sit to Stand: Supervision                Ambulation/Gait Ambulation/Gait assistance: Contact guard assist Gait Distance (Feet): 20 Feet Assistive device: None Gait Pattern/deviations: Step-through pattern, Decreased stride length Gait velocity: reduced Gait velocity interpretation: <1.8 ft/sec, indicate of risk for recurrent falls   General Gait Details: Guarded posture, ambulating to and from bathroom with CGA   Stairs             Wheelchair Mobility     Tilt Bed    Modified Rankin (Stroke Patients Only)       Balance Overall balance assessment: Mild deficits observed, not formally tested                                          Communication Communication Communication: Impaired Factors Affecting Communication: Hearing impaired  Cognition Arousal: Alert Behavior During Therapy: WFL for tasks assessed/performed   PT - Cognitive impairments: No apparent impairments                       PT - Cognition Comments: follows commands appropriately at this time, some delay may be due to [facility] Following commands: Intact      Cueing Cueing Techniques: Verbal cues  Exercises      General Comments        Pertinent Vitals/Pain Pain Assessment Pain Assessment: 0-10 Pain  Score: 7  Pain Location: ribs during movement or coughing Pain Descriptors / Indicators: Discomfort, Grimacing, Guarding Pain Intervention(s): Limited activity within patient's tolerance, Monitored during session, Patient requesting pain meds-RN notified    Home Living                          Prior Function            PT Goals (current goals can now be found in the care plan section) Acute Rehab PT Goals Patient Stated Goal: to reduce pain PT Goal Formulation: With patient Time For Goal Achievement:  12/17/23 Potential to Achieve Goals: Good Progress towards PT goals: Progressing toward goals    Frequency    Min 2X/week      PT Plan      Co-evaluation              AM-PAC PT "6 Clicks" Mobility   Outcome Measure  Help needed turning from your back to your side while in a flat bed without using bedrails?: None Help needed moving from lying on your back to sitting on the side of a flat bed without using bedrails?: None Help needed moving to and from a bed to a chair (including a wheelchair)?: A Little Help needed standing up from a chair using your arms (e.g., wheelchair or bedside chair)?: A Little Help needed to walk in hospital room?: A Little Help needed climbing 3-5 steps with a railing? : A Little 6 Click Score: 20    End of Session   Activity Tolerance: Patient tolerated treatment well;Patient limited by pain Patient left: in chair;with call bell/phone within reach;with chair alarm set Nurse Communication: Mobility status PT Visit Diagnosis: Unsteadiness on feet (R26.81);Other abnormalities of gait and mobility (R26.89);History of falling (Z91.81);Repeated falls (R29.6);Pain Pain - Right/Left: Left Pain - part of body:  (ribs)     Time: 7564-3329 PT Time Calculation (min) (ACUTE ONLY): 22 min  Charges:    $Therapeutic Activity: 8-22 mins PT General Charges $$ ACUTE PT VISIT: 1 Visit                     Verdia Glad, PT, DPT Acute Rehabilitation Services Office [phone]    Claria Crofts 12/06/2023, 11:47 AM

## 2023-12-06 NOTE — Discharge Summary (Signed)
 Patient ID: Derrick Stokes [id number] 1951/04/30 73 y.o.  Admit date: 12/01/2023 Discharge date: 12/06/2023  Admitting Diagnosis: Fall 11mm intraparenchymal hemorrhage L rib fxs 2-8  EtOH use  Syncope  Hypertension History of CAD status post CABG, DES Aortic atherosclerosis Tobacco abuse GERD Multilevel remote thoracic compression fractures  Discharge Diagnosis Patient Active Problem List   Diagnosis Date Noted   Thoracic spine fracture (HCC) 12/01/2023   Syncope 12/01/2023   Acute cholecystitis 09/18/2023   Right upper quadrant abdominal pain 09/18/2023   Alcohol abuse 09/18/2023   Dysfunction of left eustachian tube 08/24/2022   Balance problem 06/23/2022   Atherosclerosis of aorta (HCC) 06/09/2021   Essential (primary) hypertension 06/09/2021   Regurgitation of food 02/18/2020   Barrett's esophagus without dysplasia 02/18/2020   Bloating 02/18/2020   History of adenomatous polyp of colon 02/18/2020   Change in bowel habits 02/18/2020   Hiatal hernia 02/18/2020   Claudication (HCC) 09/18/2019   Coronary artery disease involving native coronary artery of native heart with angina pectoris (HCC) 09/18/2019   Chest pain wtih stable CAD at cath.   03/18/2018   Reactive airway disease 03/18/2018   Coronary artery disease with unstable angina pectoris (HCC) 03/16/2018   COPD exacerbation (HCC) 09/08/2016   B12 deficiency 06/08/2016   GERD (gastroesophageal reflux disease) 06/08/2016   Chronic bilateral low back pain with sciatica 06/08/2016   Peripheral neuropathic pain 06/08/2016   CAD (coronary artery disease), native coronary artery 09/17/2014   Unstable angina (HCC) 09/16/2014   Tobacco abuse 01/03/2014   S/P coronary artery bypass graft x 5 03/06/2011   Hyperlipidemia 03/06/2011   BARRETTS ESOPHAGUS 11/05/2008   HIATAL HERNIA 11/05/2008   DIVERTICULOSIS, COLON 11/05/2008   History of colonic polyps 11/05/2008   GASTRITIS 10/22/2008   Anxiety state  10/12/2008   EARLY SATIETY 10/12/2008   CHANGE IN BOWELS 10/12/2008   ABDOMINAL PAIN -GENERALIZED 10/12/2008   ABDOMINAL PAIN, LEFT UPPER QUADRANT 10/01/2008  Syncope with GLF TBI/71mm intraparenchymal hemorrhage  L rib fxs 2-8  EtOH use  Syncope  Hypertension  History of CAD status post CABG, DES/chest pain  Aortic atherosclerosis Tobacco abuse GERD Multilevel remote thoracic compression fractures  Hypoxia  Consultants Dr. Larrie Po, NSGY Hospitalist  Reason for Admission: Derrick Stokes is an 73 y.o. male hx of hypertension, hyperlipidemia, CAD s/p CABG 2012 with subsequent DES to LCx and RCA in 2016, COPD, tobacco abuse, alcohol abuse, GERD -- presented to [facility] ED following syncopal event last night - reports stood up from chair last night around 7pm and subsequently lost consciousness, falling to ground. Awoke with severe left sided chest wall pain suspecting he had multiple broken ribs. Hurt to take deep breath. Subsequently presented to ER 5/14 for further evaluation.   Presently he reports that his pain is better controlled but he still struggles to take a deep breath.  He does not yet have an incentive spirometer.   Underwent workup in ER and we were asked to see for admission.   Procedures none  Hospital Course:  Syncope with GLF   TBI/56mm intraparenchymal hemorrhage  per Dr. Larrie Po, seriel CT H stable, OK to resume ASA and Plavix . Cont Keppra . Plan TBI team therapies, who recommended outpatient PT.  L rib fxs 2-8  multimodal pain control, IS 10x/hr while awake, PRN O2, F/U CXR no PTX  EtOH use  CIWA, CAGE-AID  Syncope  Appreciate TRH evaluation and workup: "Most likely combination of hypoxia and orthostasis. Labs on admission though do not  support dehydration. Blood pressure on admission as well as heart rate was normal. EKG does not show any acute concerning findings. Telemetry so far negative as well. No focal deficit at the time of my  evaluation. Echocardiogram shows 55 to 60% EF, no significant valvular abnormality. CT head with intraparenchymal hemorrhage but no other acute abnormalities. No new change in the medications reported. Currently on 4 LPM oxygen. Sats drops to 80 to 85% on room air. Negative orthostatic vitals. Suspect etiology of his syncope could also be related to undiagnosed hypoxia in the setting of COPD or alcohol abuse. For completion of the workup given recurrent episodes may benefit from outpatient Zio patch placement as well. PT OT consult recommended home health. No further workup in the hospital"  Hypertension  TRH reduced Metoprolol    History of CAD status post CABG, DES/chest pain  back on ASA/Plavix .   Aortic atherosclerosis  Tobacco abuse  GERD  Multilevel remote thoracic compression fractures   Hypoxia  TRH tx for COPD exacerbation w/ Doxy and steroids. Also giving lasix  for fluid overload on CXR. Wean o2 as able, but unfortunately requires O2 at home for mobilization and with exertion.  Patient was stable on HD 5 for DC home with his son.  Physical Exam: Gen:  Alert, NAD, pleasant HEENT: EOM's intact, pupils equal and round Card:  Reg rate, NSR Pulm:  few rhonchi, but good cough.  Off O2 while sitting Abd: Soft, ND, NT Ext:  No LE edema, normal sensation upon my eval of LUE and in hand.  Neuro: CN 3-12 grossly intact, MAE's, f/c, non-focal  Allergies as of 12/06/2023       Reactions   Iodine Nausea And Vomiting, Other (See Comments)   Diaphoresis  Pallid complexion   Iohexol  Nausea And Vomiting   Shellfish Allergy Nausea And Vomiting, Other (See Comments)   Shrimp, mainly- Diaphoresis and Pallid complexion too   Isordil  Titradose [isosorbide  Dinitrate] Other (See Comments)   Headache         Medication List     STOP taking these medications    GOODY HEADACHE PO       TAKE these medications    acetaminophen  500 MG tablet Commonly known as:  TYLENOL  Take 2 tablets (1,000 mg total) by mouth every 6 (six) hours.   albuterol  108 (90 Base) MCG/ACT inhaler Commonly known as: VENTOLIN  HFA INHALE 1-2 PUFFS BY MOUTH INTO THE LUNGS EVERY 6 HOURS AS NEEDED FOR WHEEZING AND/OR FOR SHORTNESS OF BREATH   atorvastatin  80 MG tablet Commonly known as: LIPITOR  TAKE 1 TABLET (80 MG TOTAL) BY MOUTH EVERY MORNING.   benzonatate  100 MG capsule Commonly known as: TESSALON  Take 1 capsule (100 mg total) by mouth 3 (three) times daily.   clopidogrel  75 MG tablet Commonly known as: PLAVIX  TAKE 1 TABLET BY MOUTH EVERY MORNING   dextromethorphan  30 MG/5ML liquid Commonly known as: DELSYM  Take 5 mLs (30 mg total) by mouth 2 (two) times daily.   docusate sodium  100 MG capsule Commonly known as: COLACE Take 1 capsule (100 mg total) by mouth 2 (two) times daily.   doxycycline  100 MG tablet Commonly known as: VIBRA -TABS Take 1 tablet (100 mg total) by mouth every 12 (twelve) hours for 2 days.   DULoxetine  60 MG capsule Commonly known as: CYMBALTA  TAKE 1 CAPSULE BY MOUTH ONCE DAILY   ezetimibe  10 MG tablet Commonly known as: ZETIA  TAKE 1 TABLET BY MOUTH DAILY   fluticasone -salmeterol 250-50 MCG/ACT Aepb Commonly known as: ADVAIR  Inhale 1 puff into the lungs in the morning and at bedtime.   folic acid  1 MG tablet Commonly known as: FOLVITE  Take 1 tablet (1 mg total) by mouth daily. Start taking on: Dec 07, 2023   gabapentin  300 MG capsule Commonly known as: NEURONTIN  Take 1 capsule (300 mg total) by mouth 3 (three) times daily for 7 days, THEN 1 capsule (300 mg total) at bedtime. Start taking on: Dec 06, 2023 What changed: See the new instructions.   guaiFENesin  600 MG 12 hr tablet Commonly known as: MUCINEX  Take 2 tablets (1,200 mg total) by mouth 2 (two) times daily.   ipratropium-albuterol  0.5-2.5 (3) MG/3ML Soln Commonly known as: DUONEB Take 3 mLs by nebulization 4 (four) times daily for 7 days, THEN 3 mLs every 4 (four)  hours as needed for up to 21 days. Start taking on: Dec 06, 2023   levETIRAcetam  500 MG tablet Commonly known as: KEPPRA  Take 1 tablet (500 mg total) by mouth 2 (two) times daily for 2 days.   lidocaine  5 % Commonly known as: LIDODERM  Place 2 patches onto the skin daily. Remove & Discard patch within 12 hours or as directed by MD   metoprolol  succinate 25 MG 24 hr tablet Commonly known as: TOPROL -XL Take 1 tablet (25 mg total) by mouth daily. Start taking on: Dec 07, 2023 What changed:  medication strength See the new instructions.   multivitamin with minerals Tabs tablet Take 1 tablet by mouth daily. Start taking on: Dec 07, 2023   nitroGLYCERIN  0.4 MG SL tablet Commonly known as: NITROSTAT  Place 1 tablet (0.4 mg total) under the tongue every 5 (five) minutes as needed for chest pain. PLACE 1 TABLET UNDER THE TONGUE EVERY 5 MINUTES X 3 DOSES AS NEEDED FOR CHEST PAIN. What changed:  when to take this additional instructions   oxyCODONE  5 MG immediate release tablet Commonly known as: Roxicodone  Take 1 tablet (5 mg total) by mouth every 6 (six) hours as needed (pain). What changed:  how much to take reasons to take this   pantoprazole  40 MG tablet Commonly known as: PROTONIX  TAKE 1 TABLET BY MOUTH DAILY What changed: when to take this   polyethylene glycol 17 g packet Commonly known as: MIRALAX  / GLYCOLAX  Take 17 g by mouth daily. Start taking on: Dec 07, 2023   predniSONE  20 MG tablet Commonly known as: DELTASONE  Take 40mg  daily for 3days,Take 30mg  daily for 3days,Take 20mg  daily for 3days,Take 10mg  daily for 3days, then stop Start taking on: Dec 07, 2023   thiamine  100 MG tablet Commonly known as: Vitamin B-1 Take 1 tablet (100 mg total) by mouth daily. Start taking on: Dec 07, 2023   umeclidinium bromide  62.5 MCG/ACT Aepb Commonly known as: INCRUSE ELLIPTA  Inhale 1 puff into the lungs daily.               Durable Medical Equipment  (From  admission, onward)           Start     Ordered   12/06/23 1116  For home use only DME Nebulizer machine  Once       Question Answer Comment  Patient needs a nebulizer to treat with the following condition COPD (chronic obstructive pulmonary disease) (HCC)   Length of Need Lifetime   Additional equipment included Administration kit      12/06/23 1116              Follow-up Information     [facility]. Call.   Specialty: Rehabilitation Why: Call ASAP to schedule outpatient PT and speech appts. A referral has been made on your behalf. Contact information: [address] [place]  [address] [phone]        [facility] [employer] [facility] Follow up.   Why: [employer] will provide your home oxygen. Contact information: [address] [place] [address] [phone]         [place], [name], MD Follow up in 1 week(s).   Specialty: Family Medicine Contact information: [address] [place] [address] [phone]         Garry Kansas, MD Follow up.   Specialty: Neurosurgery Why: As needed after your head injury Contact information: [address] [place] [address] [phone]                 Signed: Marlin Simmonds, [facility] 12/06/2023, 12:48 PM Please see Amion for pager number during day hours 7:00am-4:30pm, 7-11:30am on Weekends

## 2023-12-06 NOTE — Progress Notes (Signed)
 Discharge instructions reviewed with patient including next dose of medications, home care services and zio patch instructions. PIV removed and belongings gathered. Patient will wait in D/C lounge for son. This RN reassured patient if son had questions he could call the unit and ask primary RN. Transport called to take patient to D/C lounge.

## 2023-12-06 NOTE — Progress Notes (Signed)
 Pt and all belongings transported to d/c lounge via wheelchair

## 2023-12-06 NOTE — Progress Notes (Signed)
 [facility] Consultation Progress Note  Patient: Derrick Stokes YQM:[id number]   PCP: [place], [name], MD DOB: May 07, 1951   DOA: 12/01/2023   DOS: 12/06/2023   Date of Service: the patient was seen and examined on 12/06/2023 Primary service: Md, Trauma, MD   Brief Hospital Course: PMH of HTN, HLD, CAD SP CABG, COPD, active smoker, alcohol use-6-8 beers a day, GERD. Presented to the hospital after passing out on 5/13.  Was sitting in the chair stood up and passed out and fell. Reports that 2 weeks ago had another fall when he was working out in the yard and without a warning passed out and fell. Drinks 6-8 beers on a daily basis.  Reports he was not drinking the day he felt this time. Smokes a pack a day.  Assessment and Plan: Syncope and collapse. Most likely combination of hypoxia and orthostasis. Labs on admission though do not support dehydration. Blood pressure on admission as well as heart rate was normal. EKG does not show any acute concerning findings. Telemetry so far negative as well. No focal deficit at the time of my evaluation. Echocardiogram shows 55 to 60% EF, no significant valvular abnormality. CT head with intraparenchymal hemorrhage but no other acute abnormalities. No new change in the medications reported. Initially was on 4 L of oxygen saturating 85% on room air. Negative orthostatic vitals. Suspect etiology of his syncope could also be related to undiagnosed hypoxia in the setting of COPD or alcohol abuse. For completion of the workup given recurrent episodes may benefit from outpatient Zio patch placement as well. PT OT consult recommended home health. No further workup in the hospital.   COPD exacerbation.  Improving. Acute hypoxic respiratory failure-resolved. Appears to have mild exacerbation of his COPD. Currently on DuoNebs, antibiotic and steroid. Recommend to continue inhalers, steroid taper and antibiotic on discharge. Does not use oxygen at his  baseline. Patient with respiratory distress, tachypnea and tachycardia.  Was on 4 L of oxygen.  Saturating 85% on room air. Initially thought that the patient will require oxygen therapy. With the treatment of COPD oxygenation improved significantly and the patient was able to stay on room air on ambulation with physical therapy on the day of discharge. Will continue nebulizer therapy on discharge. Triple therapy ordered for inhalers for long-term control of his COPD as well.  Hypertension. Blood pressure stable. Heart rate in 60s on metoprolol  25 mg. Home dose of metoprolol  is 50 mg which could have contributed to the syncope. On discharge we will continue metoprolol  25 mg only.  HLD. On statin and Zetia .  Resume on discharge.   CAD. History of CABG in 2012. History of PCI 2016. HLD Negative stress test in 2024. On aspirin  and Plavix .  Resumed on 5/15.  Monitor. Echocardiogram normal.  Less likely cardiac event as a cause of his syncope.   Active smoker. Recommended to quit smoking. Nicotine patch and nicotine gum recommended. Patient willing to quit.   Alcohol abuse. Drinks 6-8 beers on a daily basis. Tells me that he drinks because he lost his family recently. On ciwa protocol. Recommended to reduce his drinking. Gabapentin  taper ordered.  Traumatic brain injury with intraparenchymal hemorrhage with surrounding vasogenic edema. Currently admitted under trauma care service. Neurosurgery was consulted. Serial CT scans are negative. Patient started back on Plavix  and aspirin .   Anxiety and depression On gabapentin , Cymbalta . Follow-up with PCP outpatient.  Chest pain. Reported some chest pain on 5/18 which felt like it chest tightness to  RN. To me he reported that the chest pain was more related to his rib fracture. Troponins are negative.  EKG unremarkable.  Chest x-ray negative for any new findings. No further workup.  Left arm numbness. Repeat CT of the head was  performed which was negative for any acute abnormality. No focal deficit exam of my evaluation. Symptoms are improving. Monitor.   Patient is medically stable for discharge.   Subjective: Improvement in breathing.  No nausea no vomiting.  Improvement in pain.  Improvement in mentation.  Objective: Vitals:   12/06/23 0741 12/06/23 0811 12/06/23 0855 12/06/23 1100  BP:  118/74 118/74 119/74  Pulse:  61 79 63  Resp:  19  20  Temp:  97.6 F (36.4 C)  (!) 97.5 F (36.4 C)  TempSrc: Oral Oral  Oral  SpO2:  97%  93%  Weight:      Height:        Improving bilateral expiratory wheezing. S1-S2 present. Bowel sound present No edema. No focal deficit.  Family Communication: Family at bedside  Data Reviewed: Reviewed physical therapy note for oxygen saturation. Discussed with trauma surgery.  Author: Charlean Congress, MD  [facility] 12/06/2023  2:36 PM  To reach On-call, Look up on care teams to locate the [facility] team or provider name and reach out to them via secure chat or [email or web address] Between 7PM-7AM, please contact night-coverage. If you still have difficulty reaching the attending provider, please page the [facility] (Director on Call) for [facility] on amion for assistance.

## 2023-12-06 NOTE — TOC Transition Note (Signed)
 Transition of Care [facility]) - Discharge Note   Patient Details  Name: Derrick Stokes MRN: [id number] Date of Birth: 1950/10/09  Transition of Care [facility]) CM/SW Contact:  Estrella Alcaraz M, RN Phone Number: 12/06/2023, 12:13 PM   Clinical Narrative:    Pt is a 73 y/o male presenting on 5/14 after syncope and fall. Found with L rib fxs 2-8, CT with 11mm intraparenchymal hemorrhage without midline shift.  PTA, pt independent and living at home family, who can assist with care needed at discharge. Home oxygen ordered over the weekend; have received order for nebulizer machine.  Notified Jermaine with [employer] to deliver nebulizer machine prior to dc home.  Son to provide transportation home.     Final next level of care: OP Rehab Barriers to Discharge: Barriers Resolved            Discharge Plan and Services Additional resources added to the After Visit Summary for     Discharge Planning Services: CM Consult            DME Arranged: Nebulizer machine, Oxygen DME Agency: [employer] Date DME Agency Contacted: 12/06/23 Time DME Agency Contacted: 1213 Representative spoke with at DME Agency: Jennelle Mocha            Social Drivers of Health (SDOH) Interventions SDOH Screenings   Food Insecurity: No Food Insecurity (12/01/2023)  Housing: Low Risk  (12/01/2023)  Transportation Needs: No Transportation Needs (12/01/2023)  Utilities: Not At Risk (12/01/2023)  Alcohol Screen: High Risk (08/21/2022)  Depression (PHQ2-9): High Risk (06/04/2023)  Financial Resource Strain: Medium Risk (08/21/2022)  Physical Activity: Sufficiently Active (08/21/2022)  Social Connections: Socially Isolated (12/01/2023)  Stress: No Stress Concern Present (08/21/2022)  Tobacco Use: High Risk (12/01/2023)     Readmission Risk Interventions     No data to display         Calla Catchings, RN, BSN  Trauma/Neuro ICU Case Manager [phone]

## 2023-12-07 ENCOUNTER — Telehealth: Payer: Self-pay

## 2023-12-07 NOTE — Transitions of Care (Post Inpatient/ED Visit) (Signed)
   12/07/2023  Name: Derrick Stokes MRN: [id number] DOB: 06-15-1951  Today's TOC FU Call Status: Today's TOC FU Call Status:: Unsuccessful Call (1st Attempt) Unsuccessful Call (1st Attempt) Date: 12/07/23  Attempted to reach the patient regarding the most recent Inpatient/ED visit.  Follow Up Plan: Additional outreach attempts will be made to reach the patient to complete the Transitions of Care (Post Inpatient/ED visit) call.   Orpha Blade, RN, BSN, CEN [employer]- Transition of Care Team.  [facility] [phone]

## 2023-12-08 ENCOUNTER — Telehealth: Payer: Self-pay

## 2023-12-08 NOTE — Transitions of Care (Post Inpatient/ED Visit) (Signed)
   12/08/2023  Name: Derrick Stokes MRN: [id number] DOB: Sep 16, 1950  Today's TOC FU Call Status: Today's TOC FU Call Status:: Unsuccessful Call (2nd Attempt) Unsuccessful Call (2nd Attempt) Date: 12/08/23  Attempted to reach the patient regarding the most recent Inpatient/ED visit.  Follow Up Plan: Additional outreach attempts will be made to reach the patient to complete the Transitions of Care (Post Inpatient/ED visit) call.   Orpha Blade, RN, BSN, CEN [employer]- Transition of Care Team.  [facility] [phone]

## 2023-12-09 ENCOUNTER — Telehealth: Payer: Self-pay

## 2023-12-09 MED FILL — Sodium Chloride Flush IV Soln 0.9%: INTRAVENOUS | Qty: 10 | Status: AC

## 2023-12-09 MED FILL — Ondansetron HCl Inj 4 MG/2ML (2 MG/ML): INTRAMUSCULAR | Qty: 2 | Status: AC

## 2023-12-09 NOTE — Transitions of Care (Post Inpatient/ED Visit) (Signed)
   12/09/2023  Name: Derrick Stokes MRN: [id number] DOB: 1950/07/29  Today's TOC FU Call Status: Today's TOC FU Call Status:: Unsuccessful Call (3rd Attempt) Unsuccessful Call (3rd Attempt) Date: 12/09/23  Attempted to reach the patient regarding the most recent Inpatient/ED visit.  Follow Up Plan: No further outreach attempts will be made at this time. We have been unable to contact the patient.  Colbie Sliker J. Vester Balthazor RN, MSN [facility], [facility] RN Care Manager Direct Dial: [phone]  Fax: [phone] Website: [email or web address]

## 2024-01-03 ENCOUNTER — Encounter: Payer: Self-pay | Admitting: Family Medicine

## 2024-01-03 ENCOUNTER — Ambulatory Visit (INDEPENDENT_AMBULATORY_CARE_PROVIDER_SITE_OTHER): Admitting: Family Medicine

## 2024-01-03 VITALS — BP 120/70 | HR 88 | Resp 16 | Ht 67.0 in | Wt 157.2 lb

## 2024-01-03 DIAGNOSIS — G8929 Other chronic pain: Secondary | ICD-10-CM | POA: Diagnosis not present

## 2024-01-03 DIAGNOSIS — K219 Gastro-esophageal reflux disease without esophagitis: Secondary | ICD-10-CM

## 2024-01-03 DIAGNOSIS — I10 Essential (primary) hypertension: Secondary | ICD-10-CM | POA: Diagnosis not present

## 2024-01-03 DIAGNOSIS — J441 Chronic obstructive pulmonary disease with (acute) exacerbation: Secondary | ICD-10-CM

## 2024-01-03 DIAGNOSIS — R55 Syncope and collapse: Secondary | ICD-10-CM | POA: Diagnosis not present

## 2024-01-03 DIAGNOSIS — M5441 Lumbago with sciatica, right side: Secondary | ICD-10-CM | POA: Diagnosis not present

## 2024-01-03 DIAGNOSIS — M5442 Lumbago with sciatica, left side: Secondary | ICD-10-CM | POA: Diagnosis not present

## 2024-01-03 DIAGNOSIS — Z122 Encounter for screening for malignant neoplasm of respiratory organs: Secondary | ICD-10-CM

## 2024-01-03 DIAGNOSIS — I25118 Atherosclerotic heart disease of native coronary artery with other forms of angina pectoris: Secondary | ICD-10-CM

## 2024-01-03 MED ORDER — IPRATROPIUM-ALBUTEROL 0.5-2.5 (3) MG/3ML IN SOLN
3.0000 mL | Freq: Once | RESPIRATORY_TRACT | Status: AC
  Administered 2024-01-03: 3 mL via RESPIRATORY_TRACT

## 2024-01-03 MED ORDER — PANTOPRAZOLE SODIUM 40 MG PO TBEC: 40.0000 mg | DELAYED_RELEASE_TABLET | Freq: Every day | ORAL | Status: DC

## 2024-01-03 MED ORDER — FLUTICASONE-SALMETEROL 250-50 MCG/ACT IN AEPB: 1.0000 | INHALATION_SPRAY | Freq: Two times a day (BID) | RESPIRATORY_TRACT | 2 refills | Status: AC

## 2024-01-03 NOTE — Progress Notes (Signed)
 ACUTE VISIT Chief Complaint  Patient presents with   Follow-up    Had a fall and broke 7 ribs, was seen in the ED. Having some pain in his back, wondering if he may have damaged a nerve with the fall   HPI: [name] is a 73 y.o. male with a PMHx significant for CAD, HTN, GERD, HLD, and anxiety, who is here today for hospital follow up.   Since his last visit on 06/03/24, he has been admitted to the hospital twice, from 09/18/23 to 09/20/23 for RUQ pain, Dx'ed with acute cholecystitis, treated with IV ax and underwent laparoscopic cholecystectomy. At this time also noted COPD exacerbation, symptoms back to baseline.Intermittent wheezing, productive cough, and mild DOE. He has Duoneb that he uses daily as needed.  He says the numbness and tingling in his legs have resolved since the cholecystectomy. Also noted improvement of his chronic lower back pain.  Patient was hospitalized from 5/14-5/19 for a syncope event.  He stood up from chair the night before admission around 7 pm,  lost consciousness, falling to ground. Awoke with severe left sided chest wall pain.   Event complicated by ribs and traumatic brain injury with intraparenchymal hemorrhage.   He reports that he had had a previous fall shortly before ED evaluation, he thinks this is the one where he broke his ribs. After the fall he says he had some chest pain, which was aggravated after the second fall.   He has resumed Aspirin  81 mg and Plavix  75 mg daily. He is not longer on Kepra 500 mg bid. According to discharge summery, outpt PT was recommended.   In general, he says he is feeling better but has occasional dizzy spells, specially with overhead activities, when reaching outwards.  He was given a spirometer but has not been using it. Also not using supplemental oxygen at home.  Has not seen neurosurgeon. Denies headache,visual changes,difficulty swallowing, or focal weakness.   Also complains of left-sided back pain  since his fall and muscle spasm. This is worse with prolonged standing, improves with local massage against back of chair when seated.  Mentions he frequently has decreased appetite and does not eat throughout the day. He says he eats out frequently.  Lab Results  Component Value Date   NA 138 12/05/2023   CL 100 12/05/2023   K 3.4 (L) 12/05/2023   CO2 28 12/05/2023   BUN 13 12/05/2023   CREATININE 0.67 12/05/2023   GFRNONAA >60 12/05/2023   CALCIUM  9.4 12/05/2023   PHOS 3.7 09/19/2023   ALBUMIN 4.3 12/01/2023   GLUCOSE 112 (H) 12/05/2023   Lab Results  Component Value Date   WBC 11.9 (H) 12/05/2023   HGB 13.8 12/05/2023   HCT 40.5 12/05/2023   MCV 92.7 12/05/2023   PLT 213 12/05/2023   Chest CT from 12/01/2023 Impression:  1.) Nondisplaced fractures of the anterior left 2nd-8th ribs. No pneumothroax.  2.) Aortic atherosclerosis (ICD10-I70.0)  Head CT from 12/01/2023 Impression:  1.) 1.1 cm intraparenchymal hemorrhage at the left frontal vertex with mild surrounding edema. No significant mass effect or midline shift.  2.) Atrophy and chronic small vessel ischemic changes of the white matter.  3.) No CT evidence for acute osseous abnormality of the cervical spine.   GERD: He has been taking pantoprazole  40 mg at night., it was causing some nausea, resolved after discontinuing medication. Denies any heartburn, abdominal pain,or nausea.   He has reduced his alcohol consumption to one beer  daily.  He is still smoking.   HTN on Metoprolol  Succinate 25 mg daily. CAD on Plavix , Atorvastatin , zetia , and Aspirin .  Review of Systems  Constitutional:  Negative for chills and fever.  Cardiovascular:  Negative for chest pain, palpitations and leg swelling.  Gastrointestinal:  Negative for abdominal pain and vomiting.  Genitourinary:  Negative for decreased urine volume, dysuria and hematuria.  Skin:  Negative for rash.  Neurological:  Negative for facial asymmetry and weakness.   Psychiatric/Behavioral:  Negative for confusion and hallucinations.   See other pertinent positives and negatives in HPI.  Current Outpatient Medications on File Prior to Visit  Medication Sig Dispense Refill   acetaminophen  (TYLENOL ) 500 MG tablet Take 2 tablets (1,000 mg total) by mouth every 6 (six) hours. 30 tablet 0   albuterol  (VENTOLIN  HFA) 108 (90 Base) MCG/ACT inhaler INHALE 1-2 PUFFS BY MOUTH INTO THE LUNGS EVERY 6 HOURS AS NEEDED FOR WHEEZING AND/OR FOR SHORTNESS OF BREATH 8.5 g 10   atorvastatin  (LIPITOR ) 80 MG tablet TAKE 1 TABLET (80 MG TOTAL) BY MOUTH EVERY MORNING. 90 tablet 3   benzonatate  (TESSALON ) 100 MG capsule Take 1 capsule (100 mg total) by mouth 3 (three) times daily. 20 capsule 0   clopidogrel  (PLAVIX ) 75 MG tablet TAKE 1 TABLET BY MOUTH EVERY MORNING 90 tablet 3   dextromethorphan  (DELSYM ) 30 MG/5ML liquid Take 5 mLs (30 mg total) by mouth 2 (two) times daily. 89 mL 0   docusate sodium  (COLACE) 100 MG capsule Take 1 capsule (100 mg total) by mouth 2 (two) times daily. 10 capsule 0   DULoxetine  (CYMBALTA ) 60 MG capsule TAKE 1 CAPSULE BY MOUTH ONCE DAILY 90 capsule 1   ezetimibe  (ZETIA ) 10 MG tablet TAKE 1 TABLET BY MOUTH DAILY 90 tablet 3   folic acid  (FOLVITE ) 1 MG tablet Take 1 tablet (1 mg total) by mouth daily. 30 tablet 0   gabapentin  (NEURONTIN ) 300 MG capsule Take 1 capsule (300 mg total) by mouth 3 (three) times daily for 7 days, THEN 1 capsule (300 mg total) at bedtime. 30 capsule 0   guaiFENesin  (MUCINEX ) 600 MG 12 hr tablet Take 2 tablets (1,200 mg total) by mouth 2 (two) times daily. 60 tablet 0   ipratropium-albuterol  (DUONEB) 0.5-2.5 (3) MG/3ML SOLN Take 3 mLs by nebulization 4 (four) times daily for 7 days, THEN 3 mLs every 4 (four) hours as needed for up to 21 days. 360 mL 0   levETIRAcetam  (KEPPRA ) 500 MG tablet Take 1 tablet (500 mg total) by mouth 2 (two) times daily for 2 days. 4 tablet 0   lidocaine  (LIDODERM ) 5 % Place 2 patches onto the skin  daily. Remove & Discard patch within 12 hours or as directed by MD 30 patch 0   metoprolol  succinate (TOPROL -XL) 25 MG 24 hr tablet Take 1 tablet (25 mg total) by mouth daily. 30 tablet 0   Multiple Vitamin (MULTIVITAMIN WITH MINERALS) TABS tablet Take 1 tablet by mouth daily. 30 tablet 0   nitroGLYCERIN  (NITROSTAT ) 0.4 MG SL tablet Place 1 tablet (0.4 mg total) under the tongue every 5 (five) minutes as needed for chest pain. PLACE 1 TABLET UNDER THE TONGUE EVERY 5 MINUTES X 3 DOSES AS NEEDED FOR CHEST PAIN. (Patient taking differently: Place 0.4 mg under the tongue every 5 (five) minutes x 3 doses as needed for chest pain.) 25 tablet 3   polyethylene glycol (MIRALAX  / GLYCOLAX ) 17 g packet Take 17 g by mouth daily. 14 each 0  thiamine  (VITAMIN B-1) 100 MG tablet Take 1 tablet (100 mg total) by mouth daily. 30 tablet 0   umeclidinium bromide  (INCRUSE ELLIPTA ) 62.5 MCG/ACT AEPB Inhale 1 puff into the lungs daily. 90 each 0   No current facility-administered medications on file prior to visit.    Past Medical History:  Diagnosis Date   Arthritis    B12 deficiency    Borderline   CAD (coronary artery disease)    a. s/p CABG 01/2011;  b. ETT Myoview  3/14:  Low risk;  c. 09/2014 Cath/PCI: LM nl, LAD 100p, LCX 41m/OM2 60-70p (2.75x28 Synergy DES), RCA dom, 88m (atherectomy, 3.5x24 Synergy DES), PDA nl, LIMA->LAD nl, VG->Diag nl, VG->OM 100, VG->RCA 100.// Myoview  06/23/2023: No ischemia or infarction, EF 65, low risk   Cataract    bilateral sx    COPD (chronic obstructive pulmonary disease) (HCC)    GERD (gastroesophageal reflux disease)    on meds   Hiatal hernia    History of echocardiogram    Echo 10/16: EF 50-55%, no RWMA, Gr 1 DD, mild MR, mild TR   History of kidney stones    Passed   HLD (hyperlipidemia)    on meds   HOH (hard of hearing)    Hypertension    on meds   Leg pain    ABIs 4/14:  R 1.2, L 1.2, TBIs normal   Peripheral neuropathy    From trauma   Reactive airway disease  03/18/2018   Tobacco abuse    Allergies  Allergen Reactions   Iodine Nausea And Vomiting and Other (See Comments)    Diaphoresis  Pallid complexion   Iohexol  Nausea And Vomiting   Shellfish Allergy Nausea And Vomiting and Other (See Comments)    Shrimp, mainly- Diaphoresis and Pallid complexion too   Isordil  Titradose [Isosorbide  Dinitrate] Other (See Comments)    Headache    Social History   Socioeconomic History   Marital status: Married    Spouse name: Not on file   Number of children: Not on file   Years of education: Not on file   Highest education level: 10th grade  Occupational History   Occupation: Diasability  Tobacco Use   Smoking status: Every Day    Current packs/day: 1.00    Average packs/day: 1 pack/day for 55.5 years (55.5 ttl pk-yrs)    Types: Cigarettes    Start date: 1970    Passive exposure: Past   Smokeless tobacco: Never  Vaping Use   Vaping status: Never Used  Substance and Sexual Activity   Alcohol use: Yes    Alcohol/week: 3.0 standard drinks of alcohol    Types: 3 Cans of beer per week   Drug use: No   Sexual activity: Yes  Other Topics Concern   Not on file  Social History Narrative   Married   Gets regular exercise - walks 1/4 mile per day 5 times a week without symptoms   Social Drivers of Corporate investment banker Strain: Medium Risk (08/21/2022)   Overall Financial Resource Strain (CARDIA)    Difficulty of Paying Living Expenses: Somewhat hard  Food Insecurity: No Food Insecurity (12/01/2023)   Hunger Vital Sign    Worried About Running Out of Food in the Last Year: Never true    Ran Out of Food in the Last Year: Never true  Transportation Needs: No Transportation Needs (12/01/2023)   PRAPARE - Administrator, Civil Service (Medical): No    Lack of Transportation (  Non-Medical): No  Physical Activity: Sufficiently Active (08/21/2022)   Exercise Vital Sign    Days of Exercise per Week: 5 days    Minutes of Exercise per  Session: 30 min  Stress: No Stress Concern Present (08/21/2022)   [employer] of Occupational Health - Occupational Stress Questionnaire    Feeling of Stress : Only a little  Social Connections: Socially Isolated (12/01/2023)   Social Connection and Isolation Panel    Frequency of Communication with Friends and Family: More than three times a week    Frequency of Social Gatherings with Friends and Family: More than three times a week    Attends Religious Services: Never    Database administrator or Organizations: No    Attends Banker Meetings: Never    Marital Status: Widowed   Vitals:   01/03/24 1317  BP: 120/70  Pulse: 88  Resp: 16  SpO2: 95%   Body mass index is 24.63 kg/m.  Physical Exam Vitals and nursing note reviewed.  Constitutional:      General: He is not in acute distress.    Appearance: He is well-developed.  HENT:     Head: Normocephalic and atraumatic.     Mouth/Throat:     Mouth: Mucous membranes are moist.     Pharynx: Uvula midline.   Eyes:     Conjunctiva/sclera: Conjunctivae normal.    Cardiovascular:     Rate and Rhythm: Normal rate and regular rhythm.     Pulses:          Posterior tibial pulses are 2+ on the right side and 2+ on the left side.     Heart sounds: No murmur heard. Pulmonary:     Effort: Pulmonary effort is normal. No respiratory distress.     Breath sounds: Wheezing present. No rhonchi or rales.  Abdominal:     Palpations: Abdomen is soft. There is no mass.     Tenderness: There is no abdominal tenderness.   Musculoskeletal:     Right lower leg: No edema.     Left lower leg: No edema.   Skin:    General: Skin is warm.     Findings: No erythema or rash.   Neurological:     General: No focal deficit present.     Mental Status: He is alert and oriented to person, place, and time.     Gait: Gait normal.   Psychiatric:        Mood and Affect: Mood and affect normal.    ASSESSMENT AND PLAN:  Mr. Niday  was seen today for hospital follow up.  Chronic bilateral low back pain with bilateral sciatica Assessment & Plan: He reports that back pain has greatly improved and not longer having LE's symptoms since undergoing cholecystectomy. Currently he is on Gabapentin  300 mg tid (not sure) and Duloxetine  60 mg daily. He has seen Dr Louis for this problem. Continue fall precautions.   COPD exacerbation (HCC) Assessment & Plan: Reports symptoms back to baseline. Today diffused wheezing that improved with Duoneb neb treatment , given after verbal consent. He has Duoneb at home to use tid prn for acute exacerbations. Also Albuterol  inh , which he can use for milder symptoms 1-2 puff qid. Resume Advair 250-50 mcg bid, rinse after use. Not sure if he is also taking Incruse Ellipta  62.5 mcg. Strongly recommend smoking cession. Has not had lung cancer screening, had chest CT during recent hospitalization.  Orders: -     Fluticasone -Salmeterol;  Inhale 1 puff into the lungs in the morning and at bedtime.  Dispense: 180 each; Refill: 2 -     Ipratropium-Albuterol   Essential (primary) hypertension Assessment & Plan: BP adequately controlled. Continue metoprolol  succinate 25 mg daily. Potassium slightly low, 3.4 in 11/2023, we can re-check next visit. Low salt/DASH diet also recommended. Monitor BP at home.   Gastroesophageal reflux disease, unspecified whether esophagitis present Assessment & Plan: With hx of hiatal hernia and Barrett's esophagus. Recommend resuming Pantoprazole  40 mg but take med before breakfast. GERD precautions also recommended.  Orders: -     Pantoprazole  Sodium; Take 1 tablet (40 mg total) by mouth daily before breakfast.  Syncope, unspecified syncope type Assessment & Plan: He has had a few episodes in the past. Some of his co morbilities could be contributing factors. Last episode complicated with ribs and traumatic brain injury with intraparenchymal hemorrhage. Last  head CT during hospitalization no changed and was deemed appropriate to resume antiplatelet therapy. Not longer on Keppra . Has not had another episode sine hospital discharged.   Orthostatic, dehydration,and alcohol related among some considered. Telemetry during hospitalization did not show arrhythmias and he was not orthostatic at the time of initial evaluation. He has decreased alcohol intake. He does not feel HH services are necessary at this time.  Clearly instructed about warning signs.   Coronary artery disease involving native coronary artery of native heart with other form of angina pectoris [facility]) Assessment & Plan: S/p CABG in 01/2011 and stenting in 2016. Echo 12/02/23 LVEF 55-60% Lexiscan  stress test in 06/2023: Normal study. CAD: Last follow up at his cardiologist office 05/2023. Currently on Metoprolol  succinate 25 mg daily, Aspirin  81 mg daily and Plavix  75 mg daily.   I spent a total of 43 minutes in both face to face and non face to face activities for this visit on the date of this encounter. During this time history was obtained and documented, examination was performed, prior labs/imaging reviewed, and assessment/plan discussed.  Return in about 6 months (around 07/04/2024).  I, Leonce PARAS Wierda, acting as a scribe for Kiev Labrosse [place], MD., have documented all relevant documentation on the behalf of Sumire Halbleib [place], MD, as directed by  Reeya Bound [place], MD while in the presence of Yasmeen Manka [place], MD.   I, Vikkie Goeden [place], MD, have reviewed all documentation for this visit. The documentation on 01/06/24 for the exam, diagnosis, procedures, and orders are all accurate and complete.  Ethlyn Alto G. [place], MD  [facility]. [place] office.

## 2024-01-03 NOTE — Patient Instructions (Addendum)
 A few things to remember from today's visit:  Screening for lung cancer - Plan: CANCELED: CT CHEST LUNG CA SCREEN LOW DOSE W/O CM  COPD exacerbation (HCC) - Plan: fluticasone -salmeterol (ADVAIR) 250-50 MCG/ACT AEPB, ipratropium-albuterol  (DUONEB) 0.5-2.5 (3) MG/3ML nebulizer solution 3 mL  Essential (primary) hypertension  Resume Advair 2 times daily, rinse after use. Albuterol  inh 1-2 puff every 4-6 hours. Try Pantoprazole  40 mg but before breakfast Try to cook at home, emphasis in protein intake and low salt diet. We can hold on lung cancer screening after having chest CT but is is important to have chest CT annually. Continue duloxetine  60 mg daily.  If you need refills for medications you take chronically, please call your pharmacy. Do not use My Chart to request refills or for acute issues that need immediate attention. If you send a my chart message, it may take a few days to be addressed, specially if I am not in the office.  Please be sure medication list is accurate. If a new problem present, please set up appointment sooner than planned today.

## 2024-01-06 DIAGNOSIS — S22009A Unspecified fracture of unspecified thoracic vertebra, initial encounter for closed fracture: Secondary | ICD-10-CM | POA: Diagnosis not present

## 2024-01-06 DIAGNOSIS — R55 Syncope and collapse: Secondary | ICD-10-CM | POA: Diagnosis not present

## 2024-01-06 NOTE — Assessment & Plan Note (Signed)
 He reports that back pain has greatly improved and not longer having LE's symptoms since undergoing cholecystectomy. Currently he is on Gabapentin  300 mg tid (not sure) and Duloxetine  60 mg daily. He has seen Dr Adonis Alamin for this problem. Continue fall precautions.

## 2024-01-06 NOTE — Assessment & Plan Note (Addendum)
 S/p CABG in 01/2011 and stenting in 2016. Echo 12/02/23 LVEF 55-60% Lexiscan  stress test in 06/2023: Normal study. CAD: Last follow up at his cardiologist office 05/2023. Currently on Metoprolol  succinate 25 mg daily, Aspirin  81 mg daily and Plavix  75 mg daily.

## 2024-01-06 NOTE — Assessment & Plan Note (Addendum)
 BP adequately controlled. Continue metoprolol  succinate 25 mg daily. Potassium slightly low, 3.4 in 11/2023, we can re-check next visit. Low salt/DASH diet also recommended. Monitor BP at home.

## 2024-01-06 NOTE — Assessment & Plan Note (Addendum)
 Reports symptoms back to baseline. Today diffused wheezing that improved with Duoneb neb treatment , given after verbal consent. He has Duoneb at home to use tid prn for acute exacerbations. Also Albuterol  inh , which he can use for milder symptoms 1-2 puff qid. Resume Advair 250-50 mcg bid, rinse after use. Not sure if he is also taking Incruse Ellipta  62.5 mcg. Strongly recommend smoking cession. Has not had lung cancer screening, had chest CT during recent hospitalization.

## 2024-01-06 NOTE — Assessment & Plan Note (Addendum)
 He has had a few episodes in the past. Some of his co morbilities could be contributing factors. Last episode complicated with ribs and traumatic brain injury with intraparenchymal hemorrhage. Last head CT during hospitalization no changed and was deemed appropriate to resume antiplatelet therapy. Not longer on Keppra . Has not had another episode sine hospital discharged.   Orthostatic, dehydration,and alcohol related among some considered. Telemetry during hospitalization did not show arrhythmias and he was not orthostatic at the time of initial evaluation. He has decreased alcohol intake. He does not feel HH services are necessary at this time.  Clearly instructed about warning signs.

## 2024-01-06 NOTE — Assessment & Plan Note (Signed)
 With hx of hiatal hernia and Barrett's esophagus. Recommend resuming Pantoprazole  40 mg but take med before breakfast. GERD precautions also recommended.

## 2024-01-07 ENCOUNTER — Encounter: Payer: Self-pay | Admitting: *Deleted

## 2024-01-17 ENCOUNTER — Other Ambulatory Visit: Payer: Self-pay | Admitting: Family Medicine

## 2024-01-17 DIAGNOSIS — G8929 Other chronic pain: Secondary | ICD-10-CM

## 2024-02-05 DIAGNOSIS — R55 Syncope and collapse: Secondary | ICD-10-CM | POA: Diagnosis not present

## 2024-02-05 DIAGNOSIS — S22009A Unspecified fracture of unspecified thoracic vertebra, initial encounter for closed fracture: Secondary | ICD-10-CM | POA: Diagnosis not present

## 2024-02-16 ENCOUNTER — Other Ambulatory Visit: Payer: Self-pay | Admitting: Family Medicine

## 2024-03-07 DIAGNOSIS — R55 Syncope and collapse: Secondary | ICD-10-CM | POA: Diagnosis not present

## 2024-03-07 DIAGNOSIS — S22009A Unspecified fracture of unspecified thoracic vertebra, initial encounter for closed fracture: Secondary | ICD-10-CM | POA: Diagnosis not present

## 2024-04-07 DIAGNOSIS — R55 Syncope and collapse: Secondary | ICD-10-CM | POA: Diagnosis not present

## 2024-04-07 DIAGNOSIS — S22009A Unspecified fracture of unspecified thoracic vertebra, initial encounter for closed fracture: Secondary | ICD-10-CM | POA: Diagnosis not present

## 2024-05-07 DIAGNOSIS — R55 Syncope and collapse: Secondary | ICD-10-CM | POA: Diagnosis not present

## 2024-06-07 DIAGNOSIS — S22009A Unspecified fracture of unspecified thoracic vertebra, initial encounter for closed fracture: Secondary | ICD-10-CM | POA: Diagnosis not present

## 2024-06-07 DIAGNOSIS — R55 Syncope and collapse: Secondary | ICD-10-CM | POA: Diagnosis not present

## 2024-06-14 ENCOUNTER — Other Ambulatory Visit: Payer: Self-pay | Admitting: Cardiovascular Disease

## 2024-06-14 DIAGNOSIS — E785 Hyperlipidemia, unspecified: Secondary | ICD-10-CM

## 2024-06-14 DIAGNOSIS — K219 Gastro-esophageal reflux disease without esophagitis: Secondary | ICD-10-CM

## 2024-06-27 ENCOUNTER — Ambulatory Visit: Admitting: Family Medicine

## 2024-07-07 DIAGNOSIS — R55 Syncope and collapse: Secondary | ICD-10-CM | POA: Diagnosis not present

## 2024-07-07 DIAGNOSIS — S22009A Unspecified fracture of unspecified thoracic vertebra, initial encounter for closed fracture: Secondary | ICD-10-CM | POA: Diagnosis not present

## 2024-07-17 ENCOUNTER — Other Ambulatory Visit: Payer: Self-pay | Admitting: Cardiovascular Disease

## 2024-07-17 DIAGNOSIS — E785 Hyperlipidemia, unspecified: Secondary | ICD-10-CM

## 2024-07-17 DIAGNOSIS — K219 Gastro-esophageal reflux disease without esophagitis: Secondary | ICD-10-CM

## 2024-07-18 ENCOUNTER — Other Ambulatory Visit: Payer: Self-pay | Admitting: Cardiovascular Disease

## 2024-07-19 MED ORDER — CLOPIDOGREL BISULFATE 75 MG PO TABS: 75.0000 mg | ORAL_TABLET | Freq: Every morning | ORAL | 0 refills | Status: DC

## 2024-07-19 NOTE — Addendum Note (Signed)
 Addended by: BLUFORD RAMP D on: 07/19/2024 11:34 AM   Modules accepted: Orders

## 2024-08-16 ENCOUNTER — Other Ambulatory Visit: Payer: Self-pay | Admitting: Cardiovascular Disease

## 2024-08-16 DIAGNOSIS — E785 Hyperlipidemia, unspecified: Secondary | ICD-10-CM

## 2024-08-16 DIAGNOSIS — K219 Gastro-esophageal reflux disease without esophagitis: Secondary | ICD-10-CM

## 2024-08-17 NOTE — Telephone Encounter (Signed)
 In accordance with refill protocols, please review and address the following requirements before this medication refill can be authorized:  Labs

## 2024-08-19 ENCOUNTER — Other Ambulatory Visit: Payer: Self-pay | Admitting: Cardiovascular Disease
